# Patient Record
Sex: Male | Born: 1940 | Race: White | Hispanic: No | Marital: Married | Smoking: Former smoker
Health system: Southern US, Community
[De-identification: ages and names within clinical notes are randomized; demographics above are authoritative.]

## PROBLEM LIST (undated history)

## (undated) DIAGNOSIS — G20A1 Parkinson's disease without dyskinesia, without mention of fluctuations: Secondary | ICD-10-CM

## (undated) DIAGNOSIS — M51369 Other intervertebral disc degeneration, lumbar region without mention of lumbar back pain or lower extremity pain: Secondary | ICD-10-CM

## (undated) DIAGNOSIS — M1990 Unspecified osteoarthritis, unspecified site: Secondary | ICD-10-CM

## (undated) DIAGNOSIS — R251 Tremor, unspecified: Secondary | ICD-10-CM

## (undated) DIAGNOSIS — K635 Polyp of colon: Secondary | ICD-10-CM

## (undated) DIAGNOSIS — G20 Parkinson's disease: Secondary | ICD-10-CM

## (undated) DIAGNOSIS — M5430 Sciatica, unspecified side: Secondary | ICD-10-CM

## (undated) DIAGNOSIS — K219 Gastro-esophageal reflux disease without esophagitis: Secondary | ICD-10-CM

## (undated) DIAGNOSIS — D0470 Carcinoma in situ of skin of unspecified lower limb, including hip: Secondary | ICD-10-CM

## (undated) DIAGNOSIS — K5790 Diverticulosis of intestine, part unspecified, without perforation or abscess without bleeding: Secondary | ICD-10-CM

## (undated) DIAGNOSIS — I10 Essential (primary) hypertension: Secondary | ICD-10-CM

## (undated) DIAGNOSIS — M5136 Other intervertebral disc degeneration, lumbar region: Secondary | ICD-10-CM

## (undated) HISTORY — DX: Gastro-esophageal reflux disease without esophagitis: K21.9

## (undated) HISTORY — DX: Other intervertebral disc degeneration, lumbar region without mention of lumbar back pain or lower extremity pain: M51.369

## (undated) HISTORY — DX: Carcinoma in situ of skin of unspecified lower limb, including hip: D04.70

## (undated) HISTORY — DX: Unspecified osteoarthritis, unspecified site: M19.90

## (undated) HISTORY — PX: CLEFT PALATE REPAIR: SUR1165

## (undated) HISTORY — DX: Other intervertebral disc degeneration, lumbar region: M51.36

## (undated) HISTORY — DX: Sciatica, unspecified side: M54.30

## (undated) HISTORY — DX: Polyp of colon: K63.5

## (undated) HISTORY — DX: Diverticulosis of intestine, part unspecified, without perforation or abscess without bleeding: K57.90

## (undated) HISTORY — DX: Tremor, unspecified: R25.1

## (undated) HISTORY — DX: Essential (primary) hypertension: I10

## (undated) HISTORY — PX: CLEFT LIP REPAIR: SUR1164

---

## 2016-07-05 DIAGNOSIS — E784 Other hyperlipidemia: Secondary | ICD-10-CM | POA: Diagnosis not present

## 2016-07-05 DIAGNOSIS — E7800 Pure hypercholesterolemia, unspecified: Secondary | ICD-10-CM | POA: Diagnosis not present

## 2016-07-05 DIAGNOSIS — E559 Vitamin D deficiency, unspecified: Secondary | ICD-10-CM | POA: Diagnosis not present

## 2016-07-05 DIAGNOSIS — I10 Essential (primary) hypertension: Secondary | ICD-10-CM | POA: Diagnosis not present

## 2016-07-05 DIAGNOSIS — E119 Type 2 diabetes mellitus without complications: Secondary | ICD-10-CM | POA: Diagnosis not present

## 2016-07-16 DIAGNOSIS — J329 Chronic sinusitis, unspecified: Secondary | ICD-10-CM | POA: Diagnosis not present

## 2016-07-16 DIAGNOSIS — E782 Mixed hyperlipidemia: Secondary | ICD-10-CM | POA: Diagnosis not present

## 2016-07-16 DIAGNOSIS — G250 Essential tremor: Secondary | ICD-10-CM | POA: Diagnosis not present

## 2016-07-16 DIAGNOSIS — R42 Dizziness and giddiness: Secondary | ICD-10-CM | POA: Diagnosis not present

## 2016-07-31 DIAGNOSIS — L570 Actinic keratosis: Secondary | ICD-10-CM | POA: Diagnosis not present

## 2016-07-31 DIAGNOSIS — D1801 Hemangioma of skin and subcutaneous tissue: Secondary | ICD-10-CM | POA: Diagnosis not present

## 2016-07-31 DIAGNOSIS — D692 Other nonthrombocytopenic purpura: Secondary | ICD-10-CM | POA: Diagnosis not present

## 2016-07-31 DIAGNOSIS — Z7189 Other specified counseling: Secondary | ICD-10-CM | POA: Diagnosis not present

## 2016-07-31 DIAGNOSIS — L814 Other melanin hyperpigmentation: Secondary | ICD-10-CM | POA: Diagnosis not present

## 2016-07-31 DIAGNOSIS — L821 Other seborrheic keratosis: Secondary | ICD-10-CM | POA: Diagnosis not present

## 2016-09-18 DIAGNOSIS — H01001 Unspecified blepharitis right upper eyelid: Secondary | ICD-10-CM | POA: Diagnosis not present

## 2016-09-18 DIAGNOSIS — H16223 Keratoconjunctivitis sicca, not specified as Sjogren's, bilateral: Secondary | ICD-10-CM | POA: Diagnosis not present

## 2016-09-18 DIAGNOSIS — H01004 Unspecified blepharitis left upper eyelid: Secondary | ICD-10-CM | POA: Diagnosis not present

## 2016-09-18 DIAGNOSIS — H25813 Combined forms of age-related cataract, bilateral: Secondary | ICD-10-CM | POA: Diagnosis not present

## 2016-10-16 DIAGNOSIS — E119 Type 2 diabetes mellitus without complications: Secondary | ICD-10-CM | POA: Diagnosis not present

## 2016-10-16 DIAGNOSIS — J329 Chronic sinusitis, unspecified: Secondary | ICD-10-CM | POA: Diagnosis not present

## 2016-10-16 DIAGNOSIS — G250 Essential tremor: Secondary | ICD-10-CM | POA: Diagnosis not present

## 2016-10-16 DIAGNOSIS — J449 Chronic obstructive pulmonary disease, unspecified: Secondary | ICD-10-CM | POA: Diagnosis not present

## 2016-11-10 DIAGNOSIS — R110 Nausea: Secondary | ICD-10-CM | POA: Diagnosis not present

## 2016-11-16 ENCOUNTER — Telehealth: Payer: Self-pay | Admitting: Hematology

## 2016-11-16 NOTE — Telephone Encounter (Signed)
Pt has been scheduled to see Dr. Irene Limbo on 6/18 at 11:10 am. Aware to arrive at 1045am to be checked in. Pt's wife voiced understanding. They are moving to [place] from [place] and wanted an appt the week of 6/18

## 2016-11-26 ENCOUNTER — Encounter: Payer: Medicare Other | Admitting: Hematology

## 2016-11-29 ENCOUNTER — Ambulatory Visit: Payer: Self-pay | Admitting: Family Medicine

## 2016-12-10 DIAGNOSIS — Z6824 Body mass index (BMI) 24.0-24.9, adult: Secondary | ICD-10-CM | POA: Diagnosis not present

## 2016-12-10 DIAGNOSIS — R251 Tremor, unspecified: Secondary | ICD-10-CM | POA: Diagnosis not present

## 2016-12-10 DIAGNOSIS — I10 Essential (primary) hypertension: Secondary | ICD-10-CM | POA: Diagnosis not present

## 2016-12-10 DIAGNOSIS — D696 Thrombocytopenia, unspecified: Secondary | ICD-10-CM | POA: Diagnosis not present

## 2016-12-10 DIAGNOSIS — J449 Chronic obstructive pulmonary disease, unspecified: Secondary | ICD-10-CM | POA: Diagnosis present

## 2016-12-10 DIAGNOSIS — K635 Polyp of colon: Secondary | ICD-10-CM | POA: Diagnosis not present

## 2016-12-10 DIAGNOSIS — E785 Hyperlipidemia, unspecified: Secondary | ICD-10-CM | POA: Diagnosis present

## 2016-12-10 DIAGNOSIS — Z1389 Encounter for screening for other disorder: Secondary | ICD-10-CM | POA: Diagnosis not present

## 2016-12-10 DIAGNOSIS — E784 Other hyperlipidemia: Secondary | ICD-10-CM | POA: Diagnosis not present

## 2016-12-10 DIAGNOSIS — R7309 Other abnormal glucose: Secondary | ICD-10-CM | POA: Diagnosis not present

## 2016-12-17 ENCOUNTER — Telehealth: Payer: Self-pay | Admitting: Hematology

## 2016-12-17 DIAGNOSIS — R350 Frequency of micturition: Secondary | ICD-10-CM | POA: Diagnosis not present

## 2016-12-17 NOTE — Telephone Encounter (Signed)
Pt's hem appt has been rescheduled to 7/30 at 11am. Appt given to the pt's daughter. Aware to arrive 30 minutes early. Voiced understanding.

## 2016-12-19 NOTE — Progress Notes (Signed)
Michael Odonnell was seen today in the movement disorders clinic for neurologic consultation at the request of Reynold Bowen, MD.  This patient is accompanied in the office by his spouse who supplements the history.  Pt just moved from [facility].   The consultation is for the evaluation of rest tremor x 1 year.  He was seen by his primary care physician in [place] who started him on primidone for a diagnosis of "essential tremor."  This caused extreme loss of balance and the medication was discontinued.  Specific Symptoms:  Tremor: Yes.  , both hands and both feet (believes that it started this way as well); most at rest Family hx of similar:  No. Voice: weaker, more difficult to understand, more SOB Sleep: trouble getting to sleep  Vivid Dreams:  No.  Acting out dreams:  Yes.   (rarely - fell out of bed from "fighting in dreams" about 2 years ago) Wet Pillows: Yes.   Postural symptoms:  Yes.    Falls?  No. Bradykinesia symptoms: shuffling gait, slow movements, drooling while awake and difficulty getting out of a chair Loss of smell:  No. Loss of taste:  No. Urinary Incontinence:  No.  But has extreme frequency keeping him up at night Difficulty Swallowing:  No. Handwriting, micrographia: Yes.   (he is L hand dominant) Trouble with ADL's:  No. (wife states he is slower)  Trouble buttoning clothing: No. Depression:  "cannot say  - I have not thought about it." Memory changes:  Yes.  , mild Hallucinations:  No.  visual distortions: No. N/V:  Some nausea with back pain Lightheaded:  No.  Syncope: No. Diplopia:  No. Dyskinesia:  No.  Neuroimaging has not previously been performed.   PREVIOUS MEDICATIONS: Primidone (loss of balance)  ALLERGIES:   Allergies  Allergen Reactions  . Codeine Nausea And Vomiting    CURRENT MEDICATIONS:  Outpatient Encounter Prescriptions as of 12/20/2016  Medication Sig  . amLODipine (NORVASC) 5 MG tablet Take 5 mg by mouth daily.  Marland Kitchen CRANBERRY PO Take by  mouth 2 (two) times daily.  . metoprolol succinate (TOPROL-XL) 50 MG 24 hr tablet Take 50 mg by mouth daily. Take with or immediately following a meal.  . MULTIPLE VITAMIN PO Take by mouth daily.  . niacin 500 MG tablet Take 500 mg by mouth 2 (two) times daily.  . ondansetron (ZOFRAN) 4 MG tablet Take 4 mg by mouth every 8 (eight) hours as needed for nausea or vomiting.  . perindopril (ACEON) 8 MG tablet Take 8 mg by mouth daily.  . Phenazopyridine HCl (AZO DINE PO) Take by mouth.  . ranitidine (ZANTAC) 150 MG tablet Take 150 mg by mouth 2 (two) times daily.  Marland Kitchen UNABLE TO FIND CBD hemp extract drops 1000 mg BID   No facility-administered encounter medications on file as of 12/20/2016.     PAST MEDICAL HISTORY:   Past Medical History:  Diagnosis Date  . Degenerative lumbar disc   . Hypertension   . Sciatica   . Tremor     PAST SURGICAL HISTORY:   Past Surgical History:  Procedure Laterality Date  . CLEFT LIP REPAIR      SOCIAL HISTORY:   Social History   Social History  . Marital status: Married    Spouse name: N/A  . Number of children: N/A  . Years of education: N/A   Occupational History  . retired     Musician - [employer]  Social History Main Topics  . Smoking status: Former Smoker    Types: Cigarettes    Quit date: 12/20/2012  . Smokeless tobacco: Never Used  . Alcohol use No  . Drug use: No  . Sexual activity: Not on file   Other Topics Concern  . Not on file   Social History Narrative  . No narrative on file    FAMILY HISTORY:   Family Status  Relation Status  . Mother Deceased  . Father Deceased  . Sister Alive  . Brother Deceased  . Daughter Alive    ROS:  Chronic LBP.  A complete 10 system review of systems was obtained and was unremarkable apart from what is mentioned above.  PHYSICAL EXAMINATION:    VITALS:   Vitals:   12/20/16 0934  BP: (!) 146/60  Pulse: 60  SpO2: 94%  Weight: 153 lb (69.4 kg)    Height: 5' 9.5" (1.765 m)    GEN:  The patient appears stated age and is in NAD. HEENT:  Normocephalic, atraumatic.  The mucous membranes are moist. The superficial temporal arteries are without ropiness or tenderness. CV:  RRR Lungs:  CTAB Neck/HEME:  There are no carotid bruits bilaterally.  Neurological examination:  Orientation: The patient is alert and oriented x3. Fund of knowledge is appropriate.  Recent and remote memory are intact.  Attention and concentration are normal.    Able to name objects and repeat phrases. Cranial nerves: There is good facial symmetry.  There is mild facial hypomimia.  Pupils are equal round and reactive to light bilaterally. Fundoscopic exam reveals clear margins bilaterally. Extraocular muscles are intact. There are square wave jerks.  The visual fields are full to confrontational testing. The speech is fluent and clear.   He is hypophonic and somewhat pseudobulbar.  The patient is able to make the gutteral sounds without difficulty.  Soft palate rises symmetrically and there is no tongue deviation. Hearing is intact to conversational tone. Sensation: Sensation is intact to light and pinprick throughout (facial, trunk, extremities). Vibration is absent at the bilateral big toe but intact and decreased at the bilateral ankle. There is no extinction with double simultaneous stimulation. There is no sensory dermatomal level identified. Motor: Strength is 5/5 in the bilateral upper and lower extremities.   Shoulder shrug is equal and symmetric.  There is no pronator drift. Deep tendon reflexes: Deep tendon reflexes are 2-/4 at the bilateral biceps, triceps, brachioradialis, 2+ at the bilateral patella with cross adductor reflexes and absent at the bilateral achilles. Plantar responses are downgoing bilaterally.  Movement examination: Tone: There is normal tone in the bilateral upper extremities.  The tone in the lower extremities is normal.  Abnormal movements:  There is independent R and LUE resting tremor that increases with distraction.  Leg tremor bilaterally with distraction Coordination:  There is mild decremation with RAM's, seen with finger taps and alternation of supination and pronation of the forearm bilaterally.   Gait and Station: The patient has minimal difficulty arising out of a deep-seated chair without the use of the hands but is able to do it on the first attempt. The patient's stride length is normal with just slight decreased arm swing on the right.  The patient has a negative pull test.   Most recent labs done 11/12/16.     His sodium was 138, potassium 4.3, chloride 102, CO2 28, BUN 20, creatinine 1.13.  His white blood cells were 4.2, hemoglobin 15.0, hematocrit 42.6 and platelets were  low at 78.  TSH last done in 03/2015 and was 2.030  ASSESSMENT/PLAN:  1.  Tremor with parkinsonism  -Patient does not meet formal criteria via [place] [facility] Criteria for idiopathic Parkinson's disease at this point in time.  However, he does look parkinsonian and I am concerned that he will meet criteria in the future, either for idiopathic Parkinson's disease or for an atypical state.  He does have square wave jerks, pseudobulbar speech and having urinary frequency at night, although not necessarily incontinence.  We will really need to watch him closely with the course of time.  This really does not necessarily change treatment, however.  I had a long discussion with the patient and his wife.  We talked about the value of exercise and the importance of exercise, particularly safe, cardiovascular exercise.  He met with our social worker to discuss community programs in this regard.  I would not recommend starting medications.    -we will check his TSH and B12.  -will do MRI brain  2.  Thrombocytopenia  -Being monitored by primary care.  3.  Urinary frequency.  -His wife asked me about this.  We talked about the value of medication such as Myrbetriq.   Other medicines may be of value, but often times will cause loss of balance.  They are going to talk to their primary care physician about a potential referral to urology, if he feels necessary.  They asked me for that referral, but I felt that their primary care physician may be able to help them first.  4.  F/u 6 months, sooner should new issues arise.  Much greater than 50% of this visit was spent in counseling and coordinating care.  Total face to face time:  60 min   Cc:  Reynold Bowen, MD

## 2016-12-20 ENCOUNTER — Ambulatory Visit (INDEPENDENT_AMBULATORY_CARE_PROVIDER_SITE_OTHER): Payer: Medicare Other | Admitting: Neurology

## 2016-12-20 ENCOUNTER — Other Ambulatory Visit: Payer: Medicare Other

## 2016-12-20 ENCOUNTER — Encounter: Payer: Self-pay | Admitting: Neurology

## 2016-12-20 VITALS — BP 146/60 | HR 60 | Ht 69.5 in | Wt 153.0 lb

## 2016-12-20 DIAGNOSIS — R471 Dysarthria and anarthria: Secondary | ICD-10-CM | POA: Diagnosis not present

## 2016-12-20 DIAGNOSIS — D696 Thrombocytopenia, unspecified: Secondary | ICD-10-CM | POA: Diagnosis not present

## 2016-12-20 DIAGNOSIS — G20 Parkinson's disease: Secondary | ICD-10-CM

## 2016-12-20 DIAGNOSIS — R251 Tremor, unspecified: Secondary | ICD-10-CM

## 2016-12-20 DIAGNOSIS — E538 Deficiency of other specified B group vitamins: Secondary | ICD-10-CM | POA: Diagnosis not present

## 2016-12-20 LAB — TSH: TSH: 0.8 mIU/L (ref 0.40–4.50)

## 2016-12-20 NOTE — Patient Instructions (Signed)
1. We have sent a referral to [facility] for your MRI and they will call you directly to schedule your appt. They are located at [address] If you need to contact them directly please call [phone].  2. Your provider has requested that you have labwork completed today. Please go to [facility] ([id number]) on the second floor of this building before leaving the office today. You do not need to check in. If you are not called within 15 minutes please check with the front desk.   

## 2016-12-21 LAB — VITAMIN B12: Vitamin B-12: 685 pg/mL (ref 200–1100)

## 2016-12-22 ENCOUNTER — Encounter (HOSPITAL_COMMUNITY): Payer: Self-pay | Admitting: Emergency Medicine

## 2016-12-22 ENCOUNTER — Emergency Department (HOSPITAL_COMMUNITY)
Admission: EM | Admit: 2016-12-22 | Discharge: 2016-12-22 | Disposition: A | Discharge status: Home / Self Care | Payer: Medicare Other | Attending: Physician Assistant | Admitting: Physician Assistant

## 2016-12-22 ENCOUNTER — Encounter: Payer: Self-pay | Admitting: Internal Medicine

## 2016-12-22 DIAGNOSIS — Z79899 Other long term (current) drug therapy: Secondary | ICD-10-CM | POA: Diagnosis not present

## 2016-12-22 DIAGNOSIS — I10 Essential (primary) hypertension: Secondary | ICD-10-CM | POA: Diagnosis not present

## 2016-12-22 DIAGNOSIS — R350 Frequency of micturition: Secondary | ICD-10-CM | POA: Insufficient documentation

## 2016-12-22 DIAGNOSIS — Z87891 Personal history of nicotine dependence: Secondary | ICD-10-CM | POA: Insufficient documentation

## 2016-12-22 DIAGNOSIS — M545 Low back pain: Secondary | ICD-10-CM | POA: Insufficient documentation

## 2016-12-22 DIAGNOSIS — R110 Nausea: Secondary | ICD-10-CM | POA: Diagnosis not present

## 2016-12-22 LAB — URINALYSIS, ROUTINE W REFLEX MICROSCOPIC
BACTERIA UA: NONE SEEN
BILIRUBIN URINE: NEGATIVE
GLUCOSE, UA: NEGATIVE mg/dL
KETONES UR: NEGATIVE mg/dL
LEUKOCYTES UA: NEGATIVE
NITRITE: NEGATIVE
Protein, ur: NEGATIVE mg/dL
Specific Gravity, Urine: 1.005 (ref 1.005–1.030)
Squamous Epithelial / LPF: NONE SEEN
pH: 6 (ref 5.0–8.0)

## 2016-12-22 MED ORDER — TAMSULOSIN HCL 0.4 MG PO CAPS: 0.4000 mg | ORAL_CAPSULE | Freq: Every day | ORAL | 0 refills | Status: AC

## 2016-12-22 NOTE — Progress Notes (Signed)
I was called per spouse @ 2:49 pm regarding Mr Michael Odonnell Urgency and frequency which is getting worse > 1 week. Recent UA - No UTI on 12/17/16 Tests: (1) URINALYSIS (4000)   PH                        7.0                         5.0-9.0   SPECIFIC GRAVITY          1.010                       1.000 - 1.030 ! GLUCOSE                   norm                        Normal   UROBILINOGEN              norm mg/dL                  Normal   KETONES                   neg mg/dl                   NEGATIVE   BLOOD                     neg                         NEGATIVE   NITRATE                   neg                         NEGATIVE   LEUKOCYTES                neg                         NEGATIVE ! PROTEIN                   neg mg/dl                   NEGATIVE   BILIRUBIN                 neg                         NEGATIVE Currently just dribbling small amounts. Now weak and miserable. ? Obstructed. ? Prostatitis. May need catheter, Abx?  May need imaging for stone? Sent to ED for BMET, UA, Bladder scan and eval and Treat They called in 12/20/16 and consult to [facility] placed and currently being worked on but nothing set up as of yet.

## 2016-12-22 NOTE — ED Provider Notes (Signed)
[facility] DEPT Provider Note   CSN: [id number] Arrival date & time: 12/22/16  1537     History   Chief Complaint Chief Complaint  Patient presents with  . Urinary Frequency    HPI Michael Odonnell is a 76 y.o. male.  HPI   Patient is a 76 year old male presenting with urinary symptoms. Patient has increased urge. He feels he does not completely empty. He feels he wants to urinate within an hour. Patient having symptoms for the last several weeks. This got worse last week. Had a negative UA at his primary care physician's office.  Patient had no numbness or tingling no weakness. No back pain different than his ordinary lumbar sacral pain.  Past Medical History:  Diagnosis Date  . Degenerative lumbar disc   . Hypertension   . Sciatica   . Tremor     There are no active problems to display for this patient.   Past Surgical History:  Procedure Laterality Date  . CLEFT LIP REPAIR         Home Medications    Prior to Admission medications   Medication Sig Start Date End Date Taking? Authorizing Provider  amLODipine (NORVASC) 5 MG tablet Take 5 mg by mouth daily.    [provider]  CRANBERRY PO Take by mouth 2 (two) times daily.    [provider]  metoprolol succinate (TOPROL-XL) 50 MG 24 hr tablet Take 50 mg by mouth daily. Take with or immediately following a meal.    [provider]  MULTIPLE VITAMIN PO Take by mouth daily.    [provider]  niacin 500 MG tablet Take 500 mg by mouth 2 (two) times daily.    [provider]  ondansetron (ZOFRAN) 4 MG tablet Take 4 mg by mouth every 8 (eight) hours as needed for nausea or vomiting.    [provider]  perindopril (ACEON) 8 MG tablet Take 8 mg by mouth daily.    [provider]  Phenazopyridine HCl (AZO DINE PO) Take by mouth.    [provider]  ranitidine (ZANTAC) 150 MG tablet Take 150 mg by mouth 2 (two) times daily.    [provider]  tamsulosin (FLOMAX) 0.4 MG CAPS capsule Take 1 capsule (0.4 mg total) by mouth daily. 12/22/16   Asiah Befort Lyn, MD  UNABLE TO FIND CBD hemp extract drops 1000 mg BID    [provider]    Family History Family History  Problem Relation Age of Onset  . Heart attack Mother 65  . Heart disease Sister   . Melanoma Sister   . Heart attack Brother     Social History Social History  Substance Use Topics  . Smoking status: Former Smoker    Types: Cigarettes    Quit date: 12/20/2012  . Smokeless tobacco: Never Used  . Alcohol use No     Allergies   Codeine   Review of Systems Review of Systems  Constitutional: Negative for activity change.  Respiratory: Negative for shortness of breath.   Cardiovascular: Negative for chest pain.  Gastrointestinal: Negative for abdominal pain.  Genitourinary: Positive for difficulty urinating and frequency. Negative for dysuria.     Physical Exam Updated Vital Signs BP (!) 153/70 (BP Location: Left Arm)   Pulse 71   Temp 97.8 F (36.6 C) (Oral)   Resp 18   SpO2 99%   Physical Exam  Constitutional: He is oriented to person, place, and time. He appears well-nourished.  HENT:  Head: Normocephalic.  Eyes: Conjunctivae are normal.  Cardiovascular: Normal rate.   Pulmonary/Chest: Effort normal.  Abdominal: Soft. He exhibits no distension. There is no tenderness.  Neurological: He is oriented to person, place, and time.  Skin: Skin is warm and dry. He is not diaphoretic.  Psychiatric: He has a normal mood and affect. His behavior is normal.     ED Treatments / Results  Labs (all labs ordered are listed, but only abnormal results are displayed) Labs Reviewed  URINALYSIS, ROUTINE W REFLEX MICROSCOPIC - Abnormal; Notable for the following:       Result Value   Color, Urine STRAW (*)    Hgb urine dipstick SMALL (*)    All other components within normal limits    EKG  EKG Interpretation None        Radiology No results found.  Procedures Procedures (including critical care time)  Medications Ordered in ED Medications - No data to display   Initial Impression / Assessment and Plan / ED Course  I have reviewed the triage vital signs and the nursing notes.  Pertinent labs & imaging results that were available during my care of the patient were reviewed by me and considered in my medical decision making (see chart for details).     Patient with symptoms of urinary frequency urge and incomplete void. S several weeks. I'm concerned about BPH. Patient's urine is normal. He's got no red flags to consider cauda equina or other spinal etiologies. Patient's bladder scan is less than 70. We'll have him follow-up with urologist, started on Flomax.   Final Clinical Impressions(s) / ED Diagnoses   Final diagnoses:  Urinary frequency    New Prescriptions New Prescriptions   TAMSULOSIN (FLOMAX) 0.4 MG CAPS CAPSULE    Take 1 capsule (0.4 mg total) by mouth daily.     Macarthur Critchley, MD 12/22/16 2053

## 2016-12-22 NOTE — ED Triage Notes (Signed)
Pt reports he has had urinary frequency for the past week. Went to PCP for this on Monday, but urine sample came back clear. Also reports midline lower back pain and nausea.

## 2016-12-22 NOTE — Discharge Instructions (Signed)
We want you to follow-up with your primary care physician and with the urologist as soon as he can. We've given U medication that will hopefully help her symptoms. But we will need to follow up as an outpatient.

## 2016-12-24 ENCOUNTER — Telehealth: Payer: Self-pay | Admitting: Neurology

## 2016-12-24 NOTE — Telephone Encounter (Signed)
LMOM making patient aware labs okay. Ok to leave detailed vm per DPR.

## 2016-12-24 NOTE — Telephone Encounter (Signed)
-----   Message from Pieter Partridge, DO sent at 12/21/2016  7:25 AM EDT ----- Labs look okay

## 2016-12-26 ENCOUNTER — Telehealth: Payer: Self-pay | Admitting: Neurology

## 2016-12-26 NOTE — Telephone Encounter (Signed)
Patient made aware.

## 2016-12-26 NOTE — Telephone Encounter (Signed)
Caller: Wife  Urgent? No  Reason for the call: She is calling regarding his MRI scheduled for 01/03/17 at [facility]. She wanted to know could he have the MRI of his back as well as the brain? She said he is so weak almost like Flu like symptoms. Please Call. Thanks

## 2016-12-26 NOTE — Telephone Encounter (Signed)
Spoke with patient's wife.  She states that patient has had increased weakness since he was here. He was seen in the ER for urinary symptoms Saturday and has been worse since that time. He has also had low back pain for a long time. He had previous xray which just showed arthritic changes.  She wants to know if we can add an MR Lumbar on to the order for MR Brain.  I made wife aware that based on Dr. Doristine Devoid last note - it states strength was okay. Usually just based on back pain the insurance company would have him complete physical therapy before they cover this scan. He has not had PT.  Dr. Carles Collet please advise on ordering this.

## 2016-12-26 NOTE — Telephone Encounter (Signed)
I can't just add that without appropriate documentation for that.  Probably needs f/u with PCP if already been to the ED to see if something else going on

## 2016-12-31 ENCOUNTER — Other Ambulatory Visit: Payer: Self-pay | Admitting: Neurology

## 2017-01-02 ENCOUNTER — Telehealth: Payer: Self-pay | Admitting: Neurology

## 2017-01-02 DIAGNOSIS — R350 Frequency of micturition: Secondary | ICD-10-CM | POA: Diagnosis not present

## 2017-01-02 DIAGNOSIS — N401 Enlarged prostate with lower urinary tract symptoms: Secondary | ICD-10-CM | POA: Diagnosis not present

## 2017-01-02 DIAGNOSIS — R3915 Urgency of urination: Secondary | ICD-10-CM | POA: Diagnosis not present

## 2017-01-02 NOTE — Telephone Encounter (Signed)
Good afternoon Michael Odonnell this patient is scheduled for Michael. Odonnell @ GI needed me to do auth for his [facility] but according to Nae @ [facility] [place] this patient does not show that he has insurance with them. Advised for the office to call patient and verify  Received above message from [place]. Called patient to check on this. LMOM for him to call back.

## 2017-01-03 ENCOUNTER — Telehealth: Payer: Self-pay | Admitting: Hematology

## 2017-01-03 ENCOUNTER — Other Ambulatory Visit: Payer: Medicare Other

## 2017-01-03 NOTE — Telephone Encounter (Signed)
Patient's wife lft a vm to cancel appt. She stated her husband didn't want to reschedule.

## 2017-01-03 NOTE — Telephone Encounter (Signed)
Called again and spoke with patient's wife.   She states Michael Odonnell should be active til January 09, 2017.   She said either way, they cancelled his MR appt because patient states he does not want to have this done.

## 2017-01-07 ENCOUNTER — Encounter: Payer: Medicare Other | Admitting: Hematology

## 2017-02-14 DIAGNOSIS — M545 Low back pain: Secondary | ICD-10-CM | POA: Diagnosis not present

## 2017-02-20 DIAGNOSIS — H8113 Benign paroxysmal vertigo, bilateral: Secondary | ICD-10-CM | POA: Diagnosis not present

## 2017-02-20 DIAGNOSIS — M545 Low back pain: Secondary | ICD-10-CM | POA: Diagnosis not present

## 2017-02-26 DIAGNOSIS — H8113 Benign paroxysmal vertigo, bilateral: Secondary | ICD-10-CM | POA: Diagnosis not present

## 2017-02-26 DIAGNOSIS — M545 Low back pain: Secondary | ICD-10-CM | POA: Diagnosis not present

## 2017-02-28 DIAGNOSIS — H8113 Benign paroxysmal vertigo, bilateral: Secondary | ICD-10-CM | POA: Diagnosis not present

## 2017-02-28 DIAGNOSIS — M545 Low back pain: Secondary | ICD-10-CM | POA: Diagnosis not present

## 2017-03-04 ENCOUNTER — Encounter: Payer: Self-pay | Admitting: Internal Medicine

## 2017-03-14 DIAGNOSIS — H8113 Benign paroxysmal vertigo, bilateral: Secondary | ICD-10-CM | POA: Diagnosis not present

## 2017-03-14 DIAGNOSIS — M545 Low back pain: Secondary | ICD-10-CM | POA: Diagnosis not present

## 2017-04-08 ENCOUNTER — Telehealth: Payer: Self-pay | Admitting: Neurology

## 2017-04-08 NOTE — Telephone Encounter (Signed)
Rec'd records from [facility]Flow forward 8 pages to Dr. Alonza Bogus

## 2017-05-07 ENCOUNTER — Ambulatory Visit (INDEPENDENT_AMBULATORY_CARE_PROVIDER_SITE_OTHER): Payer: Medicare Other | Admitting: Internal Medicine

## 2017-05-07 ENCOUNTER — Encounter: Payer: Self-pay | Admitting: Internal Medicine

## 2017-05-07 ENCOUNTER — Encounter (INDEPENDENT_AMBULATORY_CARE_PROVIDER_SITE_OTHER): Payer: Self-pay

## 2017-05-07 VITALS — BP 116/60 | HR 68 | Ht 66.75 in | Wt 158.4 lb

## 2017-05-07 DIAGNOSIS — K219 Gastro-esophageal reflux disease without esophagitis: Secondary | ICD-10-CM | POA: Diagnosis not present

## 2017-05-07 DIAGNOSIS — Z86010 Personal history of colonic polyps: Secondary | ICD-10-CM | POA: Diagnosis not present

## 2017-05-07 DIAGNOSIS — R110 Nausea: Secondary | ICD-10-CM | POA: Diagnosis not present

## 2017-05-07 NOTE — Progress Notes (Signed)
HISTORY OF PRESENT ILLNESS:  Michael Odonnell is a 76 y.o. male with past medical history as listed below who is sent today by his primary care provider Dr. Forde Odonnell regarding problems with frequent nausea and the need for surveillance colonoscopy. Patient is accompanied today by his wife. They have relocated to [place] from [place] within the past year. Patient tells me that he was having problems with nausea in December 2017. He may have had his medications adjusted and was prescribed Zofran. At that time he had loss of appetite and loss of weight. However, his problems have subsequently resolved without recurrence area. He does have occasional acid reflux for which she takes exam tach. No dysphagia. He apparently has had multiple colonoscopies over the years. He does bring with him a copy of his last such exam from [place] performed by Michael Odonnell. This report has been personally reviewed. The indication was "a history of colon polyps" the exam was said to be complete with appropriate landmarks identified and said to be normal. No comment on quality of prep. Findings included for diminutive colon polyps which were removed with cold biopsy forceps, diverticulosis, and hemorrhoids. There are several photographs including the appendiceal orifice and retroflex view of the rectum. The pathology is available. Specimens were 5 mm or less. 2 polyps were tubular adenomas. 2 polyps were hyperplastic. The patient and his wife tell me that the doctors in [place] recommended follow-up colonoscopies every 3 years. The patient's GI review of systems is currently negative. No family history of colon cancer in first-degree relative. He has been evaluated by neurology for possible early Parkinson's. Review of outside blood work from dated June 2018 finds a normal comprehensive metabolic panel. CBC is normal except for platelets of 78,000. No relevant radiology available for review  REVIEW OF SYSTEMS:  All non-GI ROS negative  unless otherwise stated in the history of present illness except for back pain, urinary frequency, voice change, tremor  Past Medical History:  Diagnosis Date  . Arthritis   . Colon polyps   . Degenerative lumbar disc   . Diverticulosis   . GERD (gastroesophageal reflux disease)   . Hypertension   . Sciatica   . Tremor     Past Surgical History:  Procedure Laterality Date  . CLEFT LIP REPAIR    . CLEFT PALATE REPAIR      Social History Michael Odonnell  reports that he quit smoking about 4 years ago. His smoking use included cigarettes. he has never used smokeless tobacco. He reports that he does not drink alcohol or use drugs.  family history includes Heart attack in his brother; Heart attack (age of onset: 82) in his mother; Heart disease in his sister; Melanoma in his sister.  Allergies  Allergen Reactions  . Codeine Nausea And Vomiting       PHYSICAL EXAMINATION: Vital signs: BP 116/60 (BP Location: Left Arm, Patient Position: Sitting, Cuff Size: Normal)   Pulse 68   Ht 5' 6.75" (1.695 m) Comment: height measured without shoes  Wt 158 lb 6 oz (71.8 kg)   BMI 24.99 kg/m   Constitutional: generally well-appearing, no acute distress Psychiatric: alert and oriented x3, cooperative Eyes: extraocular movements intact, anicteric, conjunctiva pink Mouth: oral pharynx moist, no lesions Neck: supple no lymphadenopathy Cardiovascular: heart regular rate and rhythm, no murmur Lungs: clear to auscultation bilaterally Abdomen: soft, nontender, nondistended, no obvious ascites, no peritoneal signs, normal bowel sounds, no organomegaly Rectal: Omitted Extremities: no having, cyanosis, or lower extremity edema  bilaterally Skin: no lesions on visible extremities Neuro: No focal deficits. Tremor with pill-rolling behavior in the right hand  ASSESSMENT:  #1. Previous problems with nausea. Resolved. Based on history, possible medication reaction #2. GERD without alarm features.  Managed with Zantac #3. History of diminutive colonic adenomas on colonoscopy elsewhere 01/19/2014.   PLAN:  #1. Reflux precautions #2. Continue Zantac #3. I reviewed with the patient the current guidelines for colon polyp surveillance. Based on the findings from his last examination a routine follow-up exam would be recommended 5 years later. This would be 2020 when the patient is a 75. As such, no routine surveillance recommended. He could be reevaluated if surgery for signs or symptoms as needed.  30 minutes spent face-to-face with the patient. Greater than 50% a time use for counseling regarding his intermittent issues with nausea, GERD, and careful review of the topic of colon polyp surveillance  A copy of this consultation note has been sent to Dr. Forde Odonnell

## 2017-05-07 NOTE — Patient Instructions (Signed)
Please follow up as needed 

## 2017-05-27 DIAGNOSIS — I10 Essential (primary) hypertension: Secondary | ICD-10-CM | POA: Diagnosis not present

## 2017-05-27 DIAGNOSIS — E7849 Other hyperlipidemia: Secondary | ICD-10-CM | POA: Diagnosis not present

## 2017-06-12 DIAGNOSIS — D696 Thrombocytopenia, unspecified: Secondary | ICD-10-CM | POA: Diagnosis not present

## 2017-06-12 DIAGNOSIS — N401 Enlarged prostate with lower urinary tract symptoms: Secondary | ICD-10-CM | POA: Insufficient documentation

## 2017-06-12 DIAGNOSIS — Z6825 Body mass index (BMI) 25.0-25.9, adult: Secondary | ICD-10-CM | POA: Diagnosis not present

## 2017-06-12 DIAGNOSIS — J449 Chronic obstructive pulmonary disease, unspecified: Secondary | ICD-10-CM | POA: Diagnosis not present

## 2017-06-12 DIAGNOSIS — K635 Polyp of colon: Secondary | ICD-10-CM | POA: Diagnosis not present

## 2017-06-12 DIAGNOSIS — Z1389 Encounter for screening for other disorder: Secondary | ICD-10-CM | POA: Diagnosis not present

## 2017-06-12 DIAGNOSIS — M545 Low back pain: Secondary | ICD-10-CM | POA: Diagnosis not present

## 2017-06-12 DIAGNOSIS — R7309 Other abnormal glucose: Secondary | ICD-10-CM | POA: Diagnosis not present

## 2017-06-12 DIAGNOSIS — E7849 Other hyperlipidemia: Secondary | ICD-10-CM | POA: Diagnosis not present

## 2017-06-12 DIAGNOSIS — I10 Essential (primary) hypertension: Secondary | ICD-10-CM | POA: Diagnosis not present

## 2017-06-21 NOTE — Progress Notes (Deleted)
Michael Odonnell was seen today in the movement disorders clinic for neurologic consultation at the request of Reynold Bowen, MD.  This patient is accompanied in the office by his spouse who supplements the history.  Pt just moved from [facility].   The consultation is for the evaluation of rest tremor x 1 year.  He was seen by his primary care physician in [place] who started him on primidone for a diagnosis of "essential tremor."  This caused extreme loss of balance and the medication was discontinued.  Specific Symptoms:  Tremor: Yes.  , both hands and both feet (believes that it started this way as well); most at rest Family hx of similar:  No. Voice: weaker, more difficult to understand, more SOB Sleep: trouble getting to sleep  Vivid Dreams:  No.  Acting out dreams:  Yes.   (rarely - fell out of bed from "fighting in dreams" about 2 years ago) Wet Pillows: Yes.   Postural symptoms:  Yes.    Falls?  No. Bradykinesia symptoms: shuffling gait, slow movements, drooling while awake and difficulty getting out of a chair Loss of smell:  No. Loss of taste:  No. Urinary Incontinence:  No.  But has extreme frequency keeping him up at night Difficulty Swallowing:  No. Handwriting, micrographia: Yes.   (he is L hand dominant) Trouble with ADL's:  No. (wife states he is slower)  Trouble buttoning clothing: No. Depression:  "cannot say  - I have not thought about it." Memory changes:  Yes.  , mild Hallucinations:  No.  visual distortions: No. N/V:  Some nausea with back pain Lightheaded:  No.  Syncope: No. Diplopia:  No. Dyskinesia:  No.  Neuroimaging has not previously been performed.   06/24/17 update: Patient is seen today in follow-up.  He is accompanied by his wife who supplements the history.  I have reviewed records available to me since last visit.  He saw gastroenterology at the end of November.  He reports that tremor has been about the same since last visit.  Pt denies falls.  Pt denies  lightheadedness, near syncope.  No hallucinations.  Mood has been good.  I ordered an MRI of the brain at our last visit.  Patient canceled this and decided that he did not want to complete it.  PREVIOUS MEDICATIONS: Primidone (loss of balance)  ALLERGIES:   Allergies  Allergen Reactions  . Codeine Nausea And Vomiting    CURRENT MEDICATIONS:  Outpatient Encounter Medications as of 06/24/2017  Medication Sig  . amLODipine (NORVASC) 5 MG tablet Take 5 mg by mouth daily.  . magnesium gluconate (MAGONATE) 500 MG tablet Take 500 mg by mouth daily.  . metoprolol succinate (TOPROL-XL) 50 MG 24 hr tablet Take 50 mg by mouth daily. Take with or immediately following a meal.  . Multiple Vitamins-Minerals (CENTRUM SILVER PO) Take 1 tablet by mouth daily.  . niacin 500 MG tablet Take 500 mg by mouth 2 (two) times daily.  . ondansetron (ZOFRAN) 4 MG tablet Take 4 mg by mouth every 8 (eight) hours as needed for nausea or vomiting.  . perindopril (ACEON) 8 MG tablet Take 8 mg by mouth daily.  . ranitidine (ZANTAC) 150 MG tablet Take 150 mg by mouth 2 (two) times daily.  . tamsulosin (FLOMAX) 0.4 MG CAPS capsule Take 1 capsule (0.4 mg total) by mouth daily.  Marland Kitchen UNABLE TO FIND CBD hemp extract drops 1000 mg BID   No facility-administered encounter medications on file as of  06/24/2017.     PAST MEDICAL HISTORY:   Past Medical History:  Diagnosis Date  . Arthritis   . Colon polyps   . Degenerative lumbar disc   . Diverticulosis   . GERD (gastroesophageal reflux disease)   . Hypertension   . Sciatica   . Tremor     PAST SURGICAL HISTORY:   Past Surgical History:  Procedure Laterality Date  . CLEFT LIP REPAIR    . CLEFT PALATE REPAIR      SOCIAL HISTORY:   Social History   Socioeconomic History  . Marital status: Married    Spouse name: Not on file  . Number of children: 2  . Years of education: Not on file  . Highest education level: Not on file  Social Needs  . Financial resource  strain: Not on file  . Food insecurity - worry: Not on file  . Food insecurity - inability: Not on file  . Transportation needs - medical: Not on file  . Transportation needs - non-medical: Not on file  Occupational History  . 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 Smoking status: Former Smoker    Types: Cigarettes    Last attempt to quit: 12/20/2012    Years since quitting: 4.5  . Smokeless tobacco: Never Used  Substance and Sexual Activity  . Alcohol use: No  . Drug use: No  . Sexual activity: Not on file  Other Topics Concern  . Not on file  Social History Narrative  . Not on file    FAMILY HISTORY:   Family Status  Relation Name Status  . Mother  Deceased  . Father  Deceased  . Sister  Alive  . Brother  Deceased  . Daughter x2 Alive    ROS:  Chronic LBP.  A complete 10 system review of systems was obtained and was unremarkable apart from what is mentioned above.  PHYSICAL EXAMINATION:    VITALS:   There were no vitals filed for this visit.  GEN:  The patient appears stated age and is in NAD. HEENT:  Normocephalic, atraumatic.  The mucous membranes are moist. The superficial temporal arteries are without ropiness or tenderness. CV:  RRR Lungs:  CTAB Neck/HEME:  There are no carotid bruits bilaterally.  Neurological examination:  Orientation: The patient is alert and oriented x3. Fund of knowledge is appropriate.  Recent and remote memory are intact.  Attention and concentration are normal.    Able to name objects and repeat phrases. Cranial nerves: There is good facial symmetry.  There is mild facial hypomimia.  Pupils are equal round and reactive to light bilaterally. Fundoscopic exam reveals clear margins bilaterally. Extraocular muscles are intact. There are square wave jerks.  The visual fields are full to confrontational testing. The speech is fluent and clear.   He is hypophonic and somewhat  pseudobulbar.  The patient is able to make the gutteral sounds without difficulty.  Soft palate rises symmetrically and there is no tongue deviation. Hearing is intact to conversational tone. Sensation: Sensation is intact to light and pinprick throughout (facial, trunk, extremities). Vibration is absent at the bilateral big toe but intact and decreased at the bilateral ankle. There is no extinction with double simultaneous stimulation. There is no sensory dermatomal level identified. Motor: Strength is 5/5 in the bilateral upper and lower extremities.   Shoulder shrug is equal and symmetric.  There is no pronator drift. Deep tendon  reflexes: Deep tendon reflexes are 2-/4 at the bilateral biceps, triceps, brachioradialis, 2+ at the bilateral patella with cross adductor reflexes and absent at the bilateral achilles. Plantar responses are downgoing bilaterally.  Movement examination: Tone: There is normal tone in the bilateral upper extremities.  The tone in the lower extremities is normal.  Abnormal movements: There is independent R and LUE resting tremor that increases with distraction.  Leg tremor bilaterally with distraction Coordination:  There is mild decremation with RAM's, seen with finger taps and alternation of supination and pronation of the forearm bilaterally.   Gait and Station: The patient has minimal difficulty arising out of a deep-seated chair without the use of the hands but is able to do it on the first attempt. The patient's stride length is normal with just slight decreased arm swing on the right.  The patient has a negative pull test.   Most recent labs done 11/12/16.     His sodium was 138, potassium 4.3, chloride 102, CO2 28, BUN 20, creatinine 1.13.  His white blood cells were 4.2, hemoglobin 15.0, hematocrit 42.6 and platelets were low at 78.  Lab Results  Component Value Date   TSH 0.80 12/20/2016   Lab Results  Component Value Date   VITAMINB12 685 12/20/2016      ASSESSMENT/PLAN:  1.  Tremor with parkinsonism  -***  2.  Thrombocytopenia  -Being monitored by primary care.  3.  Urinary frequency.  -His wife asked me about this.  We talked about the value of medication such as Myrbetriq.  Other medicines may be of value, but often times will cause loss of balance.  They are going to talk to their primary care physician about a potential referral to urology, if he feels necessary.  They asked me for that referral, but I felt that their primary care physician may be able to help them first.  4.  F/u 6 months, sooner should new issues arise.  Much greater than 50% of this visit was spent in counseling and coordinating care.  Total face to face time:  60 min   Cc:  Reynold Bowen, MD

## 2017-06-24 ENCOUNTER — Ambulatory Visit: Payer: Medicare Other | Admitting: Neurology

## 2017-07-18 DIAGNOSIS — H01022 Squamous blepharitis right lower eyelid: Secondary | ICD-10-CM | POA: Diagnosis not present

## 2017-07-18 DIAGNOSIS — H01024 Squamous blepharitis left upper eyelid: Secondary | ICD-10-CM | POA: Diagnosis not present

## 2017-07-18 DIAGNOSIS — H01021 Squamous blepharitis right upper eyelid: Secondary | ICD-10-CM | POA: Diagnosis not present

## 2017-07-18 DIAGNOSIS — H00025 Hordeolum internum left lower eyelid: Secondary | ICD-10-CM | POA: Diagnosis not present

## 2017-07-18 DIAGNOSIS — H01025 Squamous blepharitis left lower eyelid: Secondary | ICD-10-CM | POA: Diagnosis not present

## 2017-08-23 DIAGNOSIS — H04123 Dry eye syndrome of bilateral lacrimal glands: Secondary | ICD-10-CM | POA: Diagnosis not present

## 2017-08-23 DIAGNOSIS — H00025 Hordeolum internum left lower eyelid: Secondary | ICD-10-CM | POA: Diagnosis not present

## 2017-08-23 DIAGNOSIS — H01025 Squamous blepharitis left lower eyelid: Secondary | ICD-10-CM | POA: Diagnosis not present

## 2017-08-23 DIAGNOSIS — H01024 Squamous blepharitis left upper eyelid: Secondary | ICD-10-CM | POA: Diagnosis not present

## 2017-08-23 DIAGNOSIS — H01022 Squamous blepharitis right lower eyelid: Secondary | ICD-10-CM | POA: Diagnosis not present

## 2017-08-23 DIAGNOSIS — H01021 Squamous blepharitis right upper eyelid: Secondary | ICD-10-CM | POA: Diagnosis not present

## 2017-09-04 DIAGNOSIS — H01021 Squamous blepharitis right upper eyelid: Secondary | ICD-10-CM | POA: Diagnosis not present

## 2017-09-04 DIAGNOSIS — H01022 Squamous blepharitis right lower eyelid: Secondary | ICD-10-CM | POA: Diagnosis not present

## 2017-09-04 DIAGNOSIS — H01024 Squamous blepharitis left upper eyelid: Secondary | ICD-10-CM | POA: Diagnosis not present

## 2017-09-04 DIAGNOSIS — H01025 Squamous blepharitis left lower eyelid: Secondary | ICD-10-CM | POA: Diagnosis not present

## 2017-09-04 DIAGNOSIS — H2513 Age-related nuclear cataract, bilateral: Secondary | ICD-10-CM | POA: Diagnosis not present

## 2017-09-24 NOTE — Progress Notes (Signed)
Michael Odonnell was seen today in the movement disorders clinic for neurologic consultation at the request of Reynold Bowen, MD.  This patient is accompanied in the office by his spouse who supplements the history.  Pt just moved from [facility].   The consultation is for the evaluation of rest tremor x 1 year.  He was seen by his primary care physician in [place] who started him on primidone for a diagnosis of "essential tremor."  This caused extreme loss of balance and the medication was discontinued.  09/25/17 update: Patient seen today in follow-up, accompanied by his wife who supplements the history.  It has been about 9 months since I last saw the patient.  MRI of the brain was ordered since our last visit, but the patient did not proceed with this.  He cancelled his appointment.  States that he is doing well. Did observe RSB class since last visit.   Records have been reviewed since last visit.  He saw gastroenterology in November.  No changes were made to his medication regimen.  He has an appt on 5/21 with dermatology for blepharitis and chronic inflammation.  Denies any swallowing issues but states "voice isn't real good."  He notes hypophonia.  No falls.  No diplopia  PREVIOUS MEDICATIONS: Primidone (loss of balance)  ALLERGIES:   Allergies  Allergen Reactions  . Codeine Nausea And Vomiting  . Metronidazole Nausea Only    CURRENT MEDICATIONS:  Outpatient Encounter Medications as of 09/25/2017  Medication Sig  . amLODipine (NORVASC) 5 MG tablet Take 5 mg by mouth daily.  . Cyanocobalamin (VITAMIN B-12 PO) Take by mouth.  . fluorometholone (FML) 0.1 % ophthalmic suspension SHAKE LQ AND INT 1 GTT IN OU TID  . LOTEMAX 0.5 % OINT APP SML AMT TO THE EYELID HS UTD  . magnesium gluconate (MAGONATE) 500 MG tablet Take 500 mg by mouth daily.  . metoprolol succinate (TOPROL-XL) 50 MG 24 hr tablet Take 50 mg by mouth daily. Take with or immediately following a meal.  . Multiple Vitamins-Minerals  (CENTRUM SILVER PO) Take 1 tablet by mouth daily.  . niacin 500 MG tablet Take 500 mg by mouth 2 (two) times daily.  . ondansetron (ZOFRAN) 4 MG tablet Take 4 mg by mouth every 8 (eight) hours as needed for nausea or vomiting.  . perindopril (ACEON) 8 MG tablet Take 8 mg by mouth daily.  . ranitidine (ZANTAC) 150 MG tablet Take 150 mg by mouth 2 (two) times daily.  . tamsulosin (FLOMAX) 0.4 MG CAPS capsule Take 1 capsule (0.4 mg total) by mouth daily.  Marland Kitchen UNABLE TO FIND CBD hemp extract drops 1000 mg BID   No facility-administered encounter medications on file as of 09/25/2017.     PAST MEDICAL HISTORY:   Past Medical History:  Diagnosis Date  . Arthritis   . Colon polyps   . Degenerative lumbar disc   . Diverticulosis   . GERD (gastroesophageal reflux disease)   . Hypertension   . Sciatica   . Tremor     PAST SURGICAL HISTORY:   Past Surgical History:  Procedure Laterality Date  . CLEFT LIP REPAIR    . CLEFT PALATE REPAIR      SOCIAL HISTORY:   Social History   Socioeconomic History  . Marital status: Married    Spouse name: Not on file  . Number of children: 2  . Years of education: Not on file  . Highest education level: Not on file  Occupational  History  . 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 Financial resource strain: Not on file  . Food insecurity:    Worry: Not on file    Inability: Not on file  . Transportation needs:    Medical: Not on file    Non-medical: Not on file  Tobacco Use  . Smoking status: Former Smoker    Types: Cigarettes    Last attempt to quit: 12/20/2012    Years since quitting: 4.7  . Smokeless tobacco: Never Used  Substance and Sexual Activity  . Alcohol use: No  . Drug use: No  . Sexual activity: Not on file  Lifestyle  . Physical activity:    Days per week: Not on file    Minutes per session: Not on file  . Stress: Not on file  Relationships  . Social  connections:    Talks on phone: Not on file    Gets together: Not on file    Attends religious service: Not on file    Active member of club or organization: Not on file    Attends meetings of clubs or organizations: Not on file    Relationship status: Not on file  . Intimate partner violence:    Fear of current or ex partner: Not on file    Emotionally abused: Not on file    Physically abused: Not on file    Forced sexual activity: Not on file  Other Topics Concern  . Not on file  Social History Narrative  . Not on file    FAMILY HISTORY:   Family Status  Relation Name Status  . Mother  Deceased  . Father  Deceased  . Sister  Alive  . Brother  Deceased  . Daughter x2 Alive    ROS:  A complete 10 system review of systems was obtained and was unremarkable apart from what is mentioned above.  PHYSICAL EXAMINATION:    VITALS:   Vitals:   09/25/17 1243  BP: 124/70  Pulse: (!) 54  SpO2: 98%  Weight: 169 lb 8 oz (76.9 kg)  Height: 5' 6.75" (1.695 m)    GEN:  The patient appears stated age and is in NAD. HEENT:  Normocephalic, atraumatic.  The mucous membranes are moist. The superficial temporal arteries are without ropiness or tenderness. CV:  Bradycardic.  Regular. Lungs:  CTAB Neck/HEME:  There are no carotid bruits bilaterally.  Neurological examination:  Orientation: The patient is alert and oriented x3.  Cranial nerves: There is good facial symmetry.  There is mild facial hypomimia.  Pupils are equal round and reactive to light bilaterally. Fundoscopic exam reveals clear margins bilaterally. Extraocular muscles are intact. There are square wave jerks.  The visual fields are full to confrontational testing. The speech is fluent and clear.   He is hypophonic and very pseudobulbar.  The patient is able to make the gutteral sounds without difficulty.  Soft palate rises symmetrically and there is no tongue deviation. Hearing is intact to conversational tone. Sensation:  Sensation is intact to light touch throughout Motor: Strength is 5/5 in the bilateral upper and lower extremities.   Shoulder shrug is equal and symmetric.  There is no pronator drift.  No fasiculations in tongue, arms, legs    Movement examination: Tone: There is normal tone in the bilateral upper extremities.  The tone in the lower extremities is normal.  Abnormal movements: There is independent R and  LUE resting tremor that increases with distraction.  Leg tremor bilaterally with distraction Coordination:  There is decremation with any form of RAMS, including alternating supination and pronation of the forearm, hand opening and closing, finger taps, heel taps and toe taps, R more than left Gait and Station: The patient is able to arise without the use of the hands.  Walks well down the hall.  Negative pull test.    Lab Results  Component Value Date   TSH 0.80 12/20/2016   Lab Results  Component Value Date   [id number] 539 12/20/2016   Patient did bring lab work to me dated June 12, 2017.  Sodium was 141, potassium 4.1, chloride 105, CO2 28, BUN 20 and creatinine 1.0.  AST is 20, ALT 17.   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  Discussed that treatments are generally the same, but prognosis is very different.  I am not sure if he has PSP and time will tell, but this certainly does look like an atypical state given progression of pseudobulbar speech.  I did not see any fasciculations to indicate motor neuron disease, nor is he weak.  After some discussion, we ultimately decided to add carbidopa/levodopa 25/100 and work to 1 tablet 3 times per day.  This may not be enough dosing, but it is a good starter dose.  Discussed extensively the short-term and long-term side effects of levodopa.  Understanding was expressed by patient and wife.  2.  Thrombocytopenia  -Being monitored by primary care.  3.   I will see the patient back in the next few months, sooner should new neurologic issues arise.  Much greater than 50% of this visit was spent in counseling and coordinating care.  Total face to face time:  25 min   Cc:  Reynold Bowen, MD

## 2017-09-25 ENCOUNTER — Ambulatory Visit (INDEPENDENT_AMBULATORY_CARE_PROVIDER_SITE_OTHER): Payer: Medicare Other | Admitting: Neurology

## 2017-09-25 ENCOUNTER — Encounter: Payer: Self-pay | Admitting: Neurology

## 2017-09-25 VITALS — BP 124/70 | HR 54 | Ht 66.75 in | Wt 169.5 lb

## 2017-09-25 DIAGNOSIS — G20 Parkinson's disease: Secondary | ICD-10-CM

## 2017-09-25 MED ORDER — CARBIDOPA-LEVODOPA 25-100 MG PO TABS: 1.0000 | ORAL_TABLET | Freq: Three times a day (TID) | ORAL | 1 refills | Status: DC

## 2017-09-25 NOTE — Patient Instructions (Addendum)
1. Start Carbidopa Levodopa as follows: (Take these doses at 6-7am, 10-11am, and 3-4pm)  Take 1/2 tablet three times daily, at least 30 minutes before meals, for one week  Then take 1/2 tablet in the morning, 1/2 tablet in the afternoon, 1 tablet in the evening, at least 30 minutes before meals, for one week  Then take 1/2 tablet in the morning, 1 tablet in the afternoon, 1 tablet in the evening, at least 30 minutes before meals, for one week  Then take 1 tablet three times daily, at least 30 minutes before meals

## 2017-09-26 ENCOUNTER — Ambulatory Visit: Payer: Medicare Other | Admitting: Neurology

## 2017-10-29 ENCOUNTER — Telehealth: Payer: Self-pay | Admitting: Neurology

## 2017-10-29 DIAGNOSIS — L718 Other rosacea: Secondary | ICD-10-CM | POA: Diagnosis not present

## 2017-10-29 NOTE — Telephone Encounter (Signed)
LMOM (ok per DPR) to let daughter know we would recommend PCP first. Lots of reasons for nausea, usually in PD can be medication related, but his medication has not been changed in over a month. To call back with any questions.

## 2017-10-29 NOTE — Telephone Encounter (Signed)
Patient's wife called and said that he has been very nauseated for about a week. She was not sure if it was related to his Parkinson's?  She would like for him to be seen this week. He is scheduled for a follow up in August but Dr. Carles Collet had an opening for this Thursday. Should he come in this week? Please Advise. Thanks

## 2017-10-30 NOTE — Progress Notes (Signed)
Michael Odonnell was seen today in the movement disorders clinic for neurologic consultation at the request of Reynold Bowen, MD.  This patient is accompanied in the office by his spouse who supplements the history.  Pt just moved from [facility].   The consultation is for the evaluation of rest tremor x 1 year.  He was seen by his primary care physician in [place] who started him on primidone for a diagnosis of "essential tremor."  This caused extreme loss of balance and the medication was discontinued.  09/25/17 update: Patient seen today in follow-up, accompanied by his wife who supplements the history.  It has been about 9 months since I last saw the patient.  MRI of the brain was ordered since our last visit, but the patient did not proceed with this.  He cancelled his appointment.  States that he is doing well. Did observe RSB class since last visit.   Records have been reviewed since last visit.  He saw gastroenterology in November.  No changes were made to his medication regimen.  He has an appt on 5/21 with dermatology for blepharitis and chronic inflammation.  Denies any swallowing issues but states "voice isn't real good."  He notes hypophonia.  No falls.  No diplopia  10/31/17 update: Patient is seen today, accompanied by his wife who supplements the history.  I just saw the patient last month.  I started him on carbidopa/levodopa 25/100 and he was to work to 1 tablet 3 times per day.  His wife called 10/29/17 stating that the patient was nauseated for 1 week and she wondered if it was related to Parkinson's disease.  It turns out he never even started the carbidopa/levodopa 25/100.  He was advised to follow-up with the primary care first given that the nausea wasn't felt from PD.  It turns out that he was placed on doxycycline and that is what caused the nausea.  He is off of that now and the nausea went away.  He has more tremor.   He has some dizziness if stands up too quickly.  Wife asks me about  helping him limit his med list.    PREVIOUS MEDICATIONS: Primidone (loss of balance)  ALLERGIES:   Allergies  Allergen Reactions  . Codeine Nausea And Vomiting  . Metronidazole Nausea Only    CURRENT MEDICATIONS:  Outpatient Encounter Medications as of 10/31/2017  Medication Sig  . amLODipine (NORVASC) 5 MG tablet Take 5 mg by mouth daily.  . Cyanocobalamin (VITAMIN B-12 PO) Take by mouth.  . doxycycline (VIBRAMYCIN) 50 MG capsule Take 1 capsule by mouth daily.  Marland Kitchen LOTEMAX 0.5 % OINT APP SML AMT TO THE EYELID HS UTD  . metoprolol succinate (TOPROL-XL) 50 MG 24 hr tablet Take 50 mg by mouth daily. Take with or immediately following a meal.  . Multiple Vitamins-Minerals (CENTRUM SILVER PO) Take 1 tablet by mouth daily.  . niacin 500 MG tablet Take 500 mg by mouth 2 (two) times daily.  . perindopril (ACEON) 8 MG tablet Take 8 mg by mouth daily.  . ranitidine (ZANTAC) 150 MG tablet Take 150 mg by mouth 2 (two) times daily.  . tamsulosin (FLOMAX) 0.4 MG CAPS capsule Take 1 capsule (0.4 mg total) by mouth daily.  Marland Kitchen UNABLE TO FIND CBD hemp extract drops 1000 mg BID  . [DISCONTINUED] carbidopa-levodopa (SINEMET IR) 25-100 MG tablet Take 1 tablet by mouth 3 (three) times daily.  . [DISCONTINUED] fluorometholone (FML) 0.1 % ophthalmic suspension SHAKE LQ  AND INT 1 GTT IN OU TID  . [DISCONTINUED] magnesium gluconate (MAGONATE) 500 MG tablet Take 500 mg by mouth daily.  . [DISCONTINUED] ondansetron (ZOFRAN) 4 MG tablet Take 4 mg by mouth every 8 (eight) hours as needed for nausea or vomiting.   No facility-administered encounter medications on file as of 10/31/2017.     PAST MEDICAL HISTORY:   Past Medical History:  Diagnosis Date  . Arthritis   . Colon polyps   . Degenerative lumbar disc   . Diverticulosis   . GERD (gastroesophageal reflux disease)   . Hypertension   . Sciatica   . Tremor     PAST SURGICAL HISTORY:   Past Surgical History:  Procedure Laterality Date  . CLEFT LIP  REPAIR    . CLEFT PALATE REPAIR      SOCIAL HISTORY:   Social History   Socioeconomic History  . Marital status: Married    Spouse name: Not on file  . Number of children: 2  . Years of education: Not on file  . Highest education level: Not on file  Occupational History  . Occupation: retired    Comment: Musician - [employer]  . Financial resource strain: Not on file  . Food insecurity:    Worry: Not on file    Inability: Not on file  . Transportation needs:    Medical: Not on file    Non-medical: Not on file  Tobacco Use  . Smoking status: Former Smoker    Types: Cigarettes    Last attempt to quit: 12/20/2012    Years since quitting: 4.8  . Smokeless tobacco: Never Used  Substance and Sexual Activity  . Alcohol use: No  . Drug use: No  . Sexual activity: Not on file  Lifestyle  . Physical activity:    Days per week: Not on file    Minutes per session: Not on file  . Stress: Not on file  Relationships  . Social connections:    Talks on phone: Not on file    Gets together: Not on file    Attends religious service: Not on file    Active member of club or organization: Not on file    Attends meetings of clubs or organizations: Not on file    Relationship status: Not on file  . Intimate partner violence:    Fear of current or ex partner: Not on file    Emotionally abused: Not on file    Physically abused: Not on file    Forced sexual activity: Not on file  Other Topics Concern  . Not on file  Social History Narrative  . Not on file    FAMILY HISTORY:   Family Status  Relation Name Status  . Mother  Deceased  . Father  Deceased  . Sister  Alive  . Brother  Deceased  . Daughter x2 Alive    ROS:  A complete 10 system review of systems was obtained and was unremarkable apart from what is mentioned above.  PHYSICAL EXAMINATION:    VITALS:   Vitals:   10/31/17 1425  BP: 128/62  Pulse: 64  SpO2: 95%  Weight:  164 lb (74.4 kg)  Height: 5\' 9"  (1.753 m)    GEN:  The patient appears stated age and is in NAD. HEENT:  Normocephalic, atraumatic.  The mucous membranes are moist. The superficial temporal arteries are without ropiness or tenderness. CV:  Bradycardic.  Regular. Lungs:  CTAB Neck/HEME:  There are no carotid bruits bilaterally.  Neurological examination:  Orientation: The patient is alert and oriented x3.  Cranial nerves: There is good facial symmetry.  There is mild facial hypomimia.  Pupils are equal round and reactive to light bilaterally. Fundoscopic exam reveals clear margins bilaterally. Extraocular muscles are intact. There are square wave jerks.  The visual fields are full to confrontational testing. The speech is fluent and clear.   He is hypophonic and very pseudobulbar (noted in past as well).  The patient is able to make the gutteral sounds without difficulty.  Soft palate rises symmetrically and there is no tongue deviation. Hearing is intact to conversational tone. Sensation: Sensation is intact to light touch throughout Motor: Strength is 5/5 in the bilateral upper and lower extremities.   Shoulder shrug is equal and symmetric.  There is no pronator drift.  There are a few rare fasciculations in the right gastrocnemius but none elsewhere (pt unrobed and was examined).  No tongue fasciculations.     Movement examination: Tone: There is normal tone in the bilateral upper extremities.  The tone in the lower extremities is normal.  Abnormal movements: There is independent R and LUE resting tremor that increases with distraction.  No leg tremor today Coordination:  There is decremation with any form of RAMS, including alternating supination and pronation of the forearm, hand opening and closing, finger taps, heel taps and toe taps, R more than left Gait and Station: The patient is able to arise without the use of the hands.  Walks well down the hall.  Negative pull test.    Lab  Results  Component Value Date   TSH 0.80 12/20/2016   Lab Results  Component Value Date   [id number] 12/20/2016   Patient did bring lab work to me dated June 12, 2017.  Sodium was 141, potassium 4.1, chloride 105, CO2 28, BUN 20 and creatinine 1.0.  AST is 20, ALT 17.   ASSESSMENT/PLAN:  1.  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  His speech is very pseudobulbar.  He was given levodopa last visit, but ultimately did not take it and is clear that he (his wife) does not wish to take it.  She brought in the package insert and we discussed the side effects which were more likely and others which were less likely, that she was worried about.  Regardless, they have opted to hold on medication.    -states that daughter told them to take B12.  His B12 is normal and there is no reason for taking it.    -refuses EMG test.  Discussed that motor neuron disease is still in the differential diagnosis, even though he has no weakness.  He doesn't wish to proceed  -wants to decrease his medication list and they gave me the medicine list to ask about.  I actually do not prescribe him any medications.  Told him he could certainly ask his primary care physician about his blood pressure medications and see if he still needs both of them, or if he still needs the same dosage as parkinsonism can decrease blood pressure.  He has noticed some lightheadedness.  He already has an appoint with his primary care physician in early July.  If the patient wants to decrease his medication list, however, they could discontinue the B12 (as above) as well as the hemp oil,  which really is not recommended in any parkinsonian state.  2.  Thrombocytopenia  -Being monitored by primary care.  3.  F/U 4 months.  Much greater than 50% of this visit was spent in counseling and coordinating care.  Total face to face  time:  25 min   Cc:  Reynold Bowen, MD

## 2017-10-31 ENCOUNTER — Ambulatory Visit (INDEPENDENT_AMBULATORY_CARE_PROVIDER_SITE_OTHER): Payer: Medicare Other | Admitting: Neurology

## 2017-10-31 ENCOUNTER — Encounter: Payer: Self-pay | Admitting: Neurology

## 2017-10-31 VITALS — BP 128/62 | HR 64 | Ht 69.0 in | Wt 164.0 lb

## 2017-10-31 DIAGNOSIS — G20 Parkinson's disease: Secondary | ICD-10-CM | POA: Diagnosis not present

## 2017-10-31 NOTE — Patient Instructions (Signed)
1.  You have opted not to do EMG.  You can let me know if you change your mind 2.  You have opted to hold on taking carbidopa/levodopa 25/100.  You can let me know if you change your mind 3.  You can talk with your PCP to see if you still need your blood pressure meds or as much dosage 4.  From a neurologic standpoint, you do not need the B12

## 2017-11-06 ENCOUNTER — Telehealth: Payer: Self-pay | Admitting: Internal Medicine

## 2017-11-06 NOTE — Telephone Encounter (Signed)
Patient wife states pt is having recurrent nausea and can not eat much. Pt wife wanting to know what to do until ov scheduled for 6.26.19. Pt last seen Dr.Perry 11.2018

## 2017-11-06 NOTE — Telephone Encounter (Signed)
Pt scheduled to see Alonza Bogus PA tomorrow at 2:30pm. Pts wife aware of appt.

## 2017-11-07 ENCOUNTER — Encounter: Payer: Self-pay | Admitting: Gastroenterology

## 2017-11-07 ENCOUNTER — Ambulatory Visit (INDEPENDENT_AMBULATORY_CARE_PROVIDER_SITE_OTHER): Payer: Medicare Other | Admitting: Gastroenterology

## 2017-11-07 VITALS — BP 110/60 | HR 78 | Ht 69.0 in | Wt 165.8 lb

## 2017-11-07 DIAGNOSIS — R110 Nausea: Secondary | ICD-10-CM

## 2017-11-07 MED ORDER — ONDANSETRON HCL 4 MG PO TABS: ORAL_TABLET | ORAL | 1 refills | Status: DC

## 2017-11-07 NOTE — Patient Instructions (Signed)
We have sent the following medications to your pharmacy for you to pick up at your convenience:  Zofran  

## 2017-11-07 NOTE — Progress Notes (Signed)
11/07/2017 Michael Odonnell [id number] Sep 19, 1940   HISTORY OF PRESENT ILLNESS:  This is a 77 year old male who is a patient of Dr. Blanch Media.  Was seen in 11/2016 with complaints of nausea.  Was thought due to medication at that time and was reported to be improving.  He returns today with ongoing/recurrent complaints.  Frequent nausea, no vomiting.  At this point he is not on many medications.  He thinks that it is from/related to the pain in his back when it gets severe.  he denies heartburn/reflux and tried zantac previously but says that it made it worse.  Moves his bowels regularly.  No abdominal pain.  Apparently had been given zofran to use when he was living in [facility] and is asking for that medication again.   Past Medical History:  Diagnosis Date  . Arthritis   . Colon polyps   . Degenerative lumbar disc   . Diverticulosis   . GERD (gastroesophageal reflux disease)   . Hypertension   . Sciatica   . Tremor    Past Surgical History:  Procedure Laterality Date  . CLEFT LIP REPAIR    . CLEFT PALATE REPAIR      reports that he quit smoking about 4 years ago. His smoking use included cigarettes. He has never used smokeless tobacco. He reports that he does not drink alcohol or use drugs. family history includes Heart attack in his brother; Heart attack (age of onset: 60) in his mother; Heart disease in his sister; Melanoma in his sister. Allergies  Allergen Reactions  . Codeine Nausea And Vomiting  . Metronidazole Nausea Only      Outpatient Encounter Medications as of 11/07/2017  Medication Sig  . amLODipine (NORVASC) 5 MG tablet Take 5 mg by mouth daily.  . Cyanocobalamin (VITAMIN B-12 PO) Take by mouth.  . LOTEMAX 0.5 % OINT APP SML AMT TO THE EYELID HS UTD  . metoprolol succinate (TOPROL-XL) 50 MG 24 hr tablet Take 50 mg by mouth daily. Take with or immediately following a meal.  . Multiple Vitamins-Minerals (CENTRUM SILVER PO) Take 1 tablet by mouth daily.  . niacin 500 MG  tablet Take 500 mg by mouth 2 (two) times daily.  . perindopril (ACEON) 8 MG tablet Take 8 mg by mouth daily.  . tamsulosin (FLOMAX) 0.4 MG CAPS capsule Take 1 capsule (0.4 mg total) by mouth daily.  Marland Kitchen UNABLE TO FIND CBD hemp extract drops 1000 mg BID  . [DISCONTINUED] doxycycline (VIBRAMYCIN) 50 MG capsule Take 1 capsule by mouth daily.  . [DISCONTINUED] ranitidine (ZANTAC) 150 MG tablet Take 150 mg by mouth 2 (two) times daily.   No facility-administered encounter medications on file as of 11/07/2017.      REVIEW OF SYSTEMS  : All other systems reviewed and negative except where noted in the History of Present Illness.   PHYSICAL EXAM: BP 110/60   Pulse 78   Ht 5\' 9"  (1.753 m)   Wt 165 lb 12.8 oz (75.2 kg)   BMI 24.48 kg/m  General: Well developed white male in no acute distress Head: Normocephalic and atraumatic Eyes:  Sclerae anicteric, conjunctiva pink. Ears: Normal auditory acuity Lungs: Clear throughout to auscultation; no increased WOB. Heart: Regular rate and rhythm; no M/R/G. Abdomen: Soft, non-distended.  BS present.  Non-tender. Musculoskeletal: Symmetrical with no gross deformities  Skin: No lesions on visible extremities Extremities: No edema  Neurological: Alert oriented x 4, grossly non-focal Psychological:  Alert and cooperative. Normal  mood and affect  ASSESSMENT AND PLAN: *Nausea, recurrent/ongoing:  Previously thought due to medication.  Not on many medications currently.  He thinks that it is from/related to the pain in his back.  We discussed the nature of nausea and how it can be very hard and complicated to determine source.  Not always a GI issue.  From a GI standpoint could evaluate and treat for reflux, ulcer.  We discussed trying PPI (tried zantac previously and says that it made it worse) and possible endoscopy.  Also discussed possible imaging of his head evaluation/management by his PCP or neurology.  It was recommended that he have an MRI of his head  previously (by neuro) but he declined due to claustrophobia.  Previously responded to Zofran.  Is asking for that medication again.  Will give Zofran to use on a as needed basis.  He has a follow-up scheduled with Dr. Henrene Pastor at the end of June, which he wishes to keep at this time.  **25 minutes spent with the patient in which at least 50% was used to discuss options for evaluation and treatment.   CC:  Reynold Bowen, MD

## 2017-11-08 NOTE — Progress Notes (Signed)
Assessment and plans reviewed  

## 2017-11-14 DIAGNOSIS — M545 Low back pain: Secondary | ICD-10-CM | POA: Diagnosis not present

## 2017-11-14 DIAGNOSIS — H8113 Benign paroxysmal vertigo, bilateral: Secondary | ICD-10-CM | POA: Diagnosis not present

## 2017-11-18 DIAGNOSIS — H01022 Squamous blepharitis right lower eyelid: Secondary | ICD-10-CM | POA: Diagnosis not present

## 2017-11-18 DIAGNOSIS — H01025 Squamous blepharitis left lower eyelid: Secondary | ICD-10-CM | POA: Diagnosis not present

## 2017-11-18 DIAGNOSIS — H02135 Senile ectropion of left lower eyelid: Secondary | ICD-10-CM | POA: Diagnosis not present

## 2017-11-18 DIAGNOSIS — H02132 Senile ectropion of right lower eyelid: Secondary | ICD-10-CM | POA: Diagnosis not present

## 2017-11-18 DIAGNOSIS — H01021 Squamous blepharitis right upper eyelid: Secondary | ICD-10-CM | POA: Diagnosis not present

## 2017-11-18 DIAGNOSIS — H01024 Squamous blepharitis left upper eyelid: Secondary | ICD-10-CM | POA: Diagnosis not present

## 2017-11-18 DIAGNOSIS — H2513 Age-related nuclear cataract, bilateral: Secondary | ICD-10-CM | POA: Diagnosis not present

## 2017-11-21 DIAGNOSIS — H8113 Benign paroxysmal vertigo, bilateral: Secondary | ICD-10-CM | POA: Diagnosis not present

## 2017-11-21 DIAGNOSIS — M545 Low back pain: Secondary | ICD-10-CM | POA: Diagnosis not present

## 2017-12-04 ENCOUNTER — Encounter (INDEPENDENT_AMBULATORY_CARE_PROVIDER_SITE_OTHER): Payer: Self-pay

## 2017-12-04 ENCOUNTER — Encounter: Payer: Self-pay | Admitting: Internal Medicine

## 2017-12-04 ENCOUNTER — Ambulatory Visit (INDEPENDENT_AMBULATORY_CARE_PROVIDER_SITE_OTHER): Payer: Medicare Other | Admitting: Internal Medicine

## 2017-12-04 VITALS — BP 104/58 | HR 63 | Ht 67.0 in | Wt 158.8 lb

## 2017-12-04 DIAGNOSIS — R110 Nausea: Secondary | ICD-10-CM | POA: Diagnosis not present

## 2017-12-04 NOTE — Patient Instructions (Signed)
You have been scheduled for an endoscopy. Please follow written instructions given to you at your visit today. If you use inhalers (even only as needed), please bring them with you on the day of your procedure. Your physician has requested that you go to www.startemmi.com and enter the access code given to you at your visit today. This web site gives a general overview about your procedure. However, you should still follow specific instructions given to you by our office regarding your preparation for the procedure.   Take your Zofran three times a day.

## 2017-12-04 NOTE — Progress Notes (Signed)
HISTORY OF PRESENT ILLNESS:  Michael Odonnell is a 77 y.o. male who has been seen in this office on 2 prior occasions regarding chronic nausea without vomiting. See those dictations. He has Parkinson's disease and is on multiple medications. At time of his most recent GI evaluation approximately 4 weeks ago he was restarted on Zofran as needed. He uses this no more than once daily and it seems to help somewhat. PPI was suggested. This was started the other day. Now taking omeprazole. Patient has no abdominal pain. No pyrosis. No dysphagia. No prior upper endoscopy. Review of blood work from Dr. Forde Dandy earlier that she was unremarkable. He has aged out of surveillance colonoscopy program.the patient is followed by neurology for his Parkinson's disease. He was to have an MRI of the brain to evaluate his nausea but did not secondary to claustrophobia. He does tell me that he has a constant low back pain which she feels may contribute to his nausea... He is accompanied by his wife  REVIEW OF SYSTEMS:  All non-GI ROS negative except for arthritis, back pain, fatigue, sleeping problems, voice change  Past Medical History:  Diagnosis Date  . Arthritis   . Colon polyps   . Degenerative lumbar disc   . Diverticulosis   . GERD (gastroesophageal reflux disease)   . Hypertension   . Sciatica   . Tremor     Past Surgical History:  Procedure Laterality Date  . CLEFT LIP REPAIR    . CLEFT PALATE REPAIR      Social History Hernando Reali  reports that he quit smoking about 4 years ago. His smoking use included cigarettes. He has never used smokeless tobacco. He reports that he does not drink alcohol or use drugs.  family history includes Heart attack in his brother; Heart attack (age of onset: 46) in his mother; Heart disease in his sister; Melanoma in his sister.  Allergies  Allergen Reactions  . Codeine Nausea And Vomiting  . Doxycycline Nausea Only  . Metronidazole Nausea Only       PHYSICAL  EXAMINATION: Vital signs: BP (!) 104/58   Pulse 63   Ht 5\' 7"  (1.702 m)   Wt 158 lb 12.8 oz (72 kg)   BMI 24.87 kg/m   Constitutional: somewhat frail elderly male, no acute distress Psychiatric: alert and oriented x3, cooperative Eyes: extraocular movements intact, anicteric, conjunctiva pink Mouth: oral pharynx moist, no lesions Neck: supple no lymphadenopathy Cardiovascular: heart regular rate and rhythm, no murmur Lungs: clear to auscultation bilaterally Abdomen: soft, nontender, nondistended, no obvious ascites, no peritoneal signs, normal bowel sounds, no organomegaly Rectal:omitted Extremities: no lower extremity edema bilaterally Skin: no lesions on visible extremities Neuro: obvious resting tremor. Cranial nerves intact  ASSESSMENT:  #1. Chronic nausea without vomiting. We discussed potential GI etiologies including Candida esophagitis, H. Pylori gastritis, pyloric stenosis, occult gallbladder disease, pyloric channel ulcer, and gastroparesis. We also discussed multiple non-GI etiologies including chronic illness, pain, neurologic issues functional (anxiety/depression), and medication reaction to name a few #2. Colon cancer surveillance. Aged out  PLAN:  #1. Continue PPI as this would help potential acid peptic disorders #2. Schedule upper endoscopy to rule out upper GI mucosal disorders #3. Recommend using Zofran on a running schedule such as 3 times daily to see if this improves his symptoms and quality of life #4. Consider gastric emptying scan if upper endoscopy unrevealing #5. Considered abdominal ultrasound to evaluate the gallbladder if upper endoscopy unrevealing #6. If GI etiology is ruled out,  then return to PCP to evaluate for the above listed non-GI causes for nausea as well as others.

## 2017-12-05 ENCOUNTER — Ambulatory Visit (AMBULATORY_SURGERY_CENTER): Payer: Medicare Other | Admitting: Internal Medicine

## 2017-12-05 ENCOUNTER — Other Ambulatory Visit: Payer: Self-pay

## 2017-12-05 ENCOUNTER — Encounter: Payer: Self-pay | Admitting: Internal Medicine

## 2017-12-05 VITALS — BP 124/46 | HR 67 | Temp 98.9°F | Resp 15 | Ht 67.0 in | Wt 158.0 lb

## 2017-12-05 DIAGNOSIS — K317 Polyp of stomach and duodenum: Secondary | ICD-10-CM

## 2017-12-05 DIAGNOSIS — K219 Gastro-esophageal reflux disease without esophagitis: Secondary | ICD-10-CM | POA: Diagnosis not present

## 2017-12-05 DIAGNOSIS — R110 Nausea: Secondary | ICD-10-CM | POA: Diagnosis not present

## 2017-12-05 DIAGNOSIS — I10 Essential (primary) hypertension: Secondary | ICD-10-CM | POA: Diagnosis not present

## 2017-12-05 MED ORDER — SODIUM CHLORIDE 0.9 % IV SOLN: 500.0000 mL | Freq: Once | INTRAVENOUS | Status: DC

## 2017-12-05 NOTE — Progress Notes (Signed)
Pt. Reports no change in his medical or surgical history since his pre-visit 12/04/2017.

## 2017-12-05 NOTE — Progress Notes (Signed)
Called to room to assist during endoscopic procedure.  Patient ID and intended procedure confirmed with present staff. Received instructions for my participation in the procedure from the performing physician.  

## 2017-12-05 NOTE — Op Note (Signed)
Michael Odonnell Patient Name: Michael Odonnell Procedure Date: 12/05/2017 10:44 AM MRN: [id number] Endoscopist: Docia Chuck. Michael Odonnell , MD Age: 77 Referring MD:  Date of Birth: 07-Jan-1941 Gender: Male Account #: [id number] Procedure:                Upper GI endoscopy, with biopsies Indications:              Nausea Medicines:                Monitored Anesthesia Care Procedure:                Pre-Anesthesia Assessment:                           - Prior to the procedure, a History and Physical                            was performed, and patient medications and                            allergies were reviewed. The patient's tolerance of                            previous anesthesia was also reviewed. The risks                            and benefits of the procedure and the sedation                            options and risks were discussed with the patient.                            All questions were answered, and informed consent                            was obtained. Prior Anticoagulants: The patient has                            taken no previous anticoagulant or antiplatelet                            agents. ASA Grade Assessment: II - A patient with                            mild systemic disease. After reviewing the risks                            and benefits, the patient was deemed in                            satisfactory condition to undergo the procedure.                           After obtaining informed consent, the endoscope was  passed under direct vision. Throughout the                            procedure, the patient's blood pressure, pulse, and                            oxygen saturations were monitored continuously. The                            Endoscope was introduced through the mouth, and                            advanced to the second part of duodenum. The upper                            GI endoscopy was accomplished  without difficulty.                            The patient tolerated the procedure well. Scope In: Scope Out: Findings:                 The esophagus was normal.                           A few small pedunculated polyps were found in the                            stomach. Biopsies were taken with a cold forceps                            for histology. Stomach was otherwise normal.                           The examined duodenum was normal.                           The cardia and gastric fundus were normal on                            retroflexion. Complications:            No immediate complications. Estimated Blood Loss:     Estimated blood loss: none. Impression:               1. Essentially normal EGD                           2. Incidental small gastric polyps. Benign                            appearing. Biopsy                           3. No obvious cause for chronic nausea. Recommendation:           1. Continue omeprazole 20 mg daily until your  follow-up                           2. Continue Zofran 3 times daily until your                            follow-up                           3. Schedule abdominal ultrasound "chronic nausea,                            rule out gallbladder disease"                           4. Schedule solid-phase gastric emptying scan                            "chronic nausea, rule out gastroparesis"                           5. Office follow-up with Dr. Henrene Odonnell in 2 months. Docia Chuck. Michael Pastor, MD 12/05/2017 10:55:41 AM This report has been signed electronically.

## 2017-12-05 NOTE — Patient Instructions (Signed)
CONTINUE OMEPRAZOLE 20 MG DAILY UNTIL FOLLOW UP APPOINTMENT  CONTINUE ZOFRAN 3 TIMES DAILY UNTIL FOLLOW UP APPOINTMENT    ULTRASOUND AND GASTRIC EMPTYING SCAN WILL BE SCHEDULED AND DR PERRY'S OFFICE WILL NOTIFY YOU OD DATE ,TIME,AND DETAILS  FOLLOW UP APPOINTMENT WILL BE MADE WITH DR PERRY FOR 2 MONTHS FROM NOW OR YOU CAN GO AHEAD AND MAKE THIS IF YOU'D LIKE    AWAIT BIOPSY RESULTS     YOU HAD AN ENDOSCOPIC PROCEDURE TODAY AT [facility]:   Refer to the procedure report that was given to you for any specific questions about what was found during the examination.  If the procedure report does not answer your questions, please call your gastroenterologist to clarify.  If you requested that your care partner not be given the details of your procedure findings, then the procedure report has been included in a sealed envelope for you to review at your convenience later.  YOU SHOULD EXPECT: Some feelings of bloating in the abdomen. Passage of more gas than usual.  Walking can help get rid of the air that was put into your GI tract during the procedure and reduce the bloating. If you had a lower endoscopy (such as a colonoscopy or flexible sigmoidoscopy) you may notice spotting of blood in your stool or on the toilet paper. If you underwent a bowel prep for your procedure, you may not have a normal bowel movement for a few days.  Please Note:  You might notice some irritation and congestion in your nose or some drainage.  This is from the oxygen used during your procedure.  There is no need for concern and it should clear up in a day or so.  SYMPTOMS TO REPORT IMMEDIATELY:    Following upper endoscopy (EGD)  Vomiting of blood or coffee ground material  New chest pain or pain under the shoulder blades  Painful or persistently difficult swallowing  New shortness of breath  Fever of 100F or higher  Black, tarry-looking stools  For urgent or emergent issues, a  gastroenterologist can be reached at any hour by calling [phone].   DIET:  We do recommend a small meal at first, but then you may proceed to your regular diet.  Drink plenty of fluids but you should avoid alcoholic beverages for 24 hours.  ACTIVITY:  You should plan to take it easy for the rest of today and you should NOT DRIVE or use heavy machinery until tomorrow (because of the sedation medicines used during the test).    FOLLOW UP: Our staff will call the number listed on your records the next business day following your procedure to check on you and address any questions or concerns that you may have regarding the information given to you following your procedure. If we do not reach you, we will leave a message.  However, if you are feeling well and you are not experiencing any problems, there is no need to return our call.  We will assume that you have returned to your regular daily activities without incident.  If any biopsies were taken you will be contacted by phone or by letter within the next 1-3 weeks.  Please call us at [phone] if you have not heard about the biopsies in 3 weeks.    SIGNATURES/CONFIDENTIALITY: You and/or your care partner have signed paperwork which will be entered into your electronic medical record.  These signatures attest to the fact that that the information above  on your After Visit Summary has been reviewed and is understood.  Full responsibility of the confidentiality of this discharge information lies with you and/or your care-partner.

## 2017-12-05 NOTE — Progress Notes (Signed)
Report to PACU, RN, vss, BBS= Clear.  

## 2017-12-06 ENCOUNTER — Other Ambulatory Visit: Payer: Self-pay

## 2017-12-06 ENCOUNTER — Telehealth: Payer: Self-pay

## 2017-12-06 ENCOUNTER — Telehealth: Payer: Self-pay | Admitting: *Deleted

## 2017-12-06 DIAGNOSIS — R110 Nausea: Secondary | ICD-10-CM

## 2017-12-06 NOTE — Telephone Encounter (Signed)
Per the procedure note, I set him up for an ultrasound and a gastric emptying scan on 12-26-17 at [facility], arrive at 7:45 am with first test at 8:00am.  Both tests will be done on this day.  Wife is aware of all of this.

## 2017-12-06 NOTE — Telephone Encounter (Signed)
  Follow up Call-  Call back number 12/05/2017  Post procedure Call Back phone  # [phone]  Permission to leave phone message Yes  Some recent data might be hidden     Patient questions:  Do you have a fever, pain , or abdominal swelling? No. Pain Score  0 *  Have you tolerated food without any problems? Yes.    Have you been able to return to your normal activities? Yes.    Do you have any questions about your discharge instructions: Diet   No. Medications  No. Follow up visit  No.  Do you have questions or concerns about your Care? No.  Actions: * If pain score is 4 or above: No action needed, pain <4.

## 2017-12-09 ENCOUNTER — Telehealth: Payer: Self-pay | Admitting: Internal Medicine

## 2017-12-09 ENCOUNTER — Encounter: Payer: Self-pay | Admitting: Internal Medicine

## 2017-12-09 DIAGNOSIS — I10 Essential (primary) hypertension: Secondary | ICD-10-CM | POA: Diagnosis not present

## 2017-12-09 DIAGNOSIS — Z125 Encounter for screening for malignant neoplasm of prostate: Secondary | ICD-10-CM | POA: Diagnosis not present

## 2017-12-09 MED ORDER — ONDANSETRON HCL 4 MG PO TABS: 4.0000 mg | ORAL_TABLET | Freq: Three times a day (TID) | ORAL | 1 refills | Status: DC

## 2017-12-09 NOTE — Telephone Encounter (Signed)
zofran refilled.

## 2017-12-16 ENCOUNTER — Telehealth: Payer: Self-pay | Admitting: Internal Medicine

## 2017-12-16 DIAGNOSIS — E7849 Other hyperlipidemia: Secondary | ICD-10-CM | POA: Diagnosis not present

## 2017-12-16 DIAGNOSIS — J449 Chronic obstructive pulmonary disease, unspecified: Secondary | ICD-10-CM | POA: Diagnosis not present

## 2017-12-16 DIAGNOSIS — Z6823 Body mass index (BMI) 23.0-23.9, adult: Secondary | ICD-10-CM | POA: Diagnosis not present

## 2017-12-16 DIAGNOSIS — I10 Essential (primary) hypertension: Secondary | ICD-10-CM | POA: Diagnosis not present

## 2017-12-16 DIAGNOSIS — R110 Nausea: Secondary | ICD-10-CM | POA: Diagnosis not present

## 2017-12-16 DIAGNOSIS — D696 Thrombocytopenia, unspecified: Secondary | ICD-10-CM | POA: Diagnosis not present

## 2017-12-16 DIAGNOSIS — G20 Parkinson's disease: Secondary | ICD-10-CM | POA: Diagnosis not present

## 2017-12-16 DIAGNOSIS — R7309 Other abnormal glucose: Secondary | ICD-10-CM | POA: Diagnosis not present

## 2017-12-16 DIAGNOSIS — N401 Enlarged prostate with lower urinary tract symptoms: Secondary | ICD-10-CM | POA: Diagnosis not present

## 2017-12-16 MED ORDER — ONDANSETRON HCL 4 MG PO TABS: 4.0000 mg | ORAL_TABLET | Freq: Three times a day (TID) | ORAL | 1 refills | Status: DC

## 2017-12-16 NOTE — Telephone Encounter (Signed)
Resent Zofran

## 2017-12-26 ENCOUNTER — Ambulatory Visit (HOSPITAL_COMMUNITY)
Admission: RE | Admit: 2017-12-26 | Discharge: 2017-12-26 | Disposition: A | Discharge status: Home / Self Care | Payer: Medicare Other | Source: Ambulatory Visit | Attending: Internal Medicine | Admitting: Internal Medicine

## 2017-12-26 ENCOUNTER — Encounter (HOSPITAL_COMMUNITY)
Admission: RE | Admit: 2017-12-26 | Discharge: 2017-12-26 | Disposition: A | Discharge status: Home / Self Care | Payer: Medicare Other | Source: Ambulatory Visit | Attending: Internal Medicine | Admitting: Internal Medicine

## 2017-12-26 DIAGNOSIS — R110 Nausea: Secondary | ICD-10-CM | POA: Insufficient documentation

## 2017-12-26 DIAGNOSIS — R109 Unspecified abdominal pain: Secondary | ICD-10-CM | POA: Diagnosis not present

## 2017-12-26 DIAGNOSIS — N281 Cyst of kidney, acquired: Secondary | ICD-10-CM | POA: Diagnosis not present

## 2017-12-26 MED ORDER — TECHNETIUM TC 99M SULFUR COLLOID
2.0000 | Freq: Once | INTRAVENOUS | Status: AC | PRN
  Administered 2017-12-26: 2 via INTRAVENOUS

## 2018-01-07 DIAGNOSIS — H02115 Cicatricial ectropion of left lower eyelid: Secondary | ICD-10-CM | POA: Diagnosis not present

## 2018-01-30 ENCOUNTER — Encounter: Payer: Self-pay | Admitting: Internal Medicine

## 2018-01-30 ENCOUNTER — Ambulatory Visit (INDEPENDENT_AMBULATORY_CARE_PROVIDER_SITE_OTHER): Payer: Medicare Other | Admitting: Internal Medicine

## 2018-01-30 ENCOUNTER — Ambulatory Visit: Payer: Medicare Other | Admitting: Internal Medicine

## 2018-01-30 VITALS — BP 120/58 | HR 63 | Ht 66.75 in | Wt 155.0 lb

## 2018-01-30 DIAGNOSIS — R109 Unspecified abdominal pain: Secondary | ICD-10-CM | POA: Diagnosis not present

## 2018-01-30 DIAGNOSIS — R110 Nausea: Secondary | ICD-10-CM | POA: Diagnosis not present

## 2018-01-30 MED ORDER — OMEPRAZOLE 20 MG PO CPDR: 20.0000 mg | DELAYED_RELEASE_CAPSULE | Freq: Every day | ORAL | 11 refills | Status: DC

## 2018-01-30 MED ORDER — ONDANSETRON HCL 4 MG PO TABS: 4.0000 mg | ORAL_TABLET | Freq: Three times a day (TID) | ORAL | 3 refills | Status: DC

## 2018-01-30 NOTE — Progress Notes (Signed)
HISTORY OF PRESENT ILLNESS:  Michael Odonnell is a 77 y.o. male but has been evaluated in this office on several occasions for chronic nausea. He has a history of Parkinson's disease and arthritis. He has undergone extensive GI evaluation to rule out GI causes for his chronic nausea including laboratories, upper endoscopy, abdominal ultrasound, and gastric emptying study. All of these were unremarkable. He was last seen in this office 12/04/2017. He was empirically placed on omeprazole 20 mg daily and Zofran 4 mg 3 times daily running. He presents for follow-up at this time. He is accompanied by his wife. They're pleased to report that his nausea is markedly improved as is his general appetite. Despite this, he has lost 3 pounds since his last visit. GI complaints include flatus and intermittent mid abdominal cramping which is promptly relieved with antacids. No new complaints, though he does tell me that he is anticipating therapy for his Parkinson's disease.  REVIEW OF SYSTEMS:  All non-GI ROS negative except for arthritis, back pain, voice change  Past Medical History:  Diagnosis Date  . Arthritis   . Colon polyps   . Degenerative lumbar disc   . Diverticulosis   . GERD (gastroesophageal reflux disease)   . Hypertension   . Sciatica   . Tremor     Past Surgical History:  Procedure Laterality Date  . CLEFT LIP REPAIR    . CLEFT PALATE REPAIR      Social History Obert Espindola  reports that he quit smoking about 5 years ago. His smoking use included cigarettes. He has never used smokeless tobacco. He reports that he does not drink alcohol or use drugs.  family history includes Heart attack in his brother; Heart attack (age of onset: 32) in his mother; Heart disease in his sister; Melanoma in his sister.  Allergies  Allergen Reactions  . Codeine Nausea And Vomiting  . Doxycycline Nausea Only  . Metronidazole Nausea Only       PHYSICAL EXAMINATION: Vital signs: 120/58, 63, 155  pounds  Constitutional: generally well-appearing, no acute distress. Psychiatric: alert and oriented x3, cooperative Eyes: extraocular movements intact, anicteric, conjunctiva pink Mouth: oral pharynx moist, no lesions Neck: supple no lymphadenopathy Cardiovascular: heart regular rate and rhythm, no murmur Lungs: clear to auscultation bilaterally Abdomen: soft, nontender, nondistended, no obvious ascites, no peritoneal signs, normal bowel sounds, no organomegaly Rectal:omitted Extremities: no clubbing, cyanosis, or lower extremity edema bilaterally Skin: no lesions on visible extremities Neuro: bilateral resting hand tremor.   ASSESSMENT:  #1. Chronic nausea. No GI cause found despite extensive GI workup. Suspect central or functional  Etiology. #2. Nonspecific short lived abdominal cramping   PLAN:  #1. Continue empiric PPI by chance the patient has asked sensitivity contributing to nausea #2. Continue Zofran 3 times daily. I do believe this is why he feels better. His prescription has been refilled #3. Resume care with Dr. Forde Dandy for general medical needs and Dr. Carles Collet regarding his neurologic issues. GI follow-up as needed  25 minutes spent face-to-face with the patient. Greater than 50% a time use for counseling and answering questions regarding his issues with chronic nausea and nonspecific abdominal cramping

## 2018-01-30 NOTE — Patient Instructions (Signed)
We have sent the following medications to your pharmacy for you to pick up at your convenience:  Omeprazole and Zofran  Please return to the care of your PCP and follow up as needed

## 2018-02-04 DIAGNOSIS — H02115 Cicatricial ectropion of left lower eyelid: Secondary | ICD-10-CM | POA: Diagnosis not present

## 2018-02-04 DIAGNOSIS — H02135 Senile ectropion of left lower eyelid: Secondary | ICD-10-CM | POA: Diagnosis not present

## 2018-02-04 DIAGNOSIS — G20 Parkinson's disease: Secondary | ICD-10-CM | POA: Diagnosis not present

## 2018-02-04 DIAGNOSIS — Z87891 Personal history of nicotine dependence: Secondary | ICD-10-CM | POA: Diagnosis not present

## 2018-02-04 DIAGNOSIS — I10 Essential (primary) hypertension: Secondary | ICD-10-CM | POA: Diagnosis not present

## 2018-02-04 DIAGNOSIS — Z79899 Other long term (current) drug therapy: Secondary | ICD-10-CM | POA: Diagnosis not present

## 2018-02-06 ENCOUNTER — Encounter

## 2018-02-06 ENCOUNTER — Ambulatory Visit: Payer: Medicare Other | Admitting: Neurology

## 2018-03-04 DIAGNOSIS — L853 Xerosis cutis: Secondary | ICD-10-CM | POA: Diagnosis not present

## 2018-03-04 DIAGNOSIS — L570 Actinic keratosis: Secondary | ICD-10-CM | POA: Diagnosis not present

## 2018-03-04 DIAGNOSIS — Z85828 Personal history of other malignant neoplasm of skin: Secondary | ICD-10-CM | POA: Diagnosis not present

## 2018-03-05 NOTE — Progress Notes (Signed)
Michael Odonnell was seen today in the movement disorders clinic for neurologic consultation at the request of Reynold Bowen, MD.  This patient is accompanied in the office by his spouse who supplements the history.  Pt just moved from [facility].   The consultation is for the evaluation of rest tremor x 1 year.  He was seen by his primary care physician in [place] who started him on primidone for a diagnosis of "essential tremor."  This caused extreme loss of balance and the medication was discontinued.  09/25/17 update: Patient seen today in follow-up, accompanied by his wife who supplements the history.  It has been about 9 months since I last saw the patient.  MRI of the brain was ordered since our last visit, but the patient did not proceed with this.  He cancelled his appointment.  States that he is doing well. Did observe RSB class since last visit.   Records have been reviewed since last visit.  He saw gastroenterology in November.  No changes were made to his medication regimen.  He has an appt on 5/21 with dermatology for blepharitis and chronic inflammation.  Denies any swallowing issues but states "voice isn't real good."  He notes hypophonia.  No falls.  No diplopia  10/31/17 update: Patient is seen today, accompanied by his wife who supplements the history.  I just saw the patient last month.  I started him on carbidopa/levodopa 25/100 and he was to work to 1 tablet 3 times per day.  His wife called 10/29/17 stating that the patient was nauseated for 1 week and she wondered if it was related to Parkinson's disease.  It turns out he never even started the carbidopa/levodopa 25/100.  He was advised to follow-up with the primary care first given that the nausea wasn't felt from PD.  It turns out that he was placed on doxycycline and that is what caused the nausea.  He is off of that now and the nausea went away.  He has more tremor.   He has some dizziness if stands up too quickly.  Wife asks me about  helping him limit his med list.    03/07/18 update: Patient is seen in follow-up for atypical parkinsonism, accompanied by his wife who supplements the history.  He is on no medication, which is by their choice.  No falls.  He does some home PT exercises.  No hallucinations or visual distortions.  Records are reviewed since our last visit.  He has been to GI multiple times with nausea, but the last time he was there in August, he reported that nausea was much better.  He states that he still has it some.    PREVIOUS MEDICATIONS: Primidone (loss of balance)  ALLERGIES:   Allergies  Allergen Reactions  . Codeine Nausea And Vomiting  . Doxycycline Nausea Only  . Metronidazole Nausea Only    CURRENT MEDICATIONS:  Outpatient Encounter Medications as of 03/07/2018  Medication Sig  . acetaminophen (TYLENOL) 500 MG tablet Take 1,000 mg by mouth every 6 (six) hours as needed.  Marland Kitchen amLODipine (NORVASC) 5 MG tablet Take 5 mg by mouth daily.  . Calcium Carbonate Antacid (TUMS PO) Take by mouth.  . magnesium 30 MG tablet Take 30 mg by mouth daily.  . metoprolol succinate (TOPROL-XL) 50 MG 24 hr tablet Take 50 mg by mouth daily. Take with or immediately following a meal.  . Multiple Vitamins-Minerals (CENTRUM SILVER 50+MEN) TABS Take by mouth daily.  . niacin  500 MG tablet Take 500 mg by mouth 2 (two) times daily.  Marland Kitchen omeprazole (PRILOSEC) 20 MG capsule Take 1 capsule (20 mg total) by mouth daily.  . ondansetron (ZOFRAN) 4 MG tablet Take 1 tablet (4 mg total) by mouth 3 (three) times daily. 1 tablet every 6-8 hours as needed  . perindopril (ACEON) 8 MG tablet Take 8 mg by mouth daily.  . polyethylene glycol (MIRALAX / GLYCOLAX) packet Take 17 g by mouth daily as needed.  . tamsulosin (FLOMAX) 0.4 MG CAPS capsule Take 1 capsule (0.4 mg total) by mouth daily.  . [DISCONTINUED] UNABLE TO FIND CBD hemp extract drops 1000 mg BID  . [DISCONTINUED] 0.9 %  sodium chloride infusion    No facility-administered  encounter medications on file as of 03/07/2018.     PAST MEDICAL HISTORY:   Past Medical History:  Diagnosis Date  . Arthritis   . Colon polyps   . Degenerative lumbar disc   . Diverticulosis   . GERD (gastroesophageal reflux disease)   . Hypertension   . Sciatica   . Tremor     PAST SURGICAL HISTORY:   Past Surgical History:  Procedure Laterality Date  . CLEFT LIP REPAIR    . CLEFT PALATE REPAIR      SOCIAL HISTORY:   Social History   Socioeconomic History  . Marital status: Married    Spouse name: Not on file  . Number of children: 2  . Years of education: Not on file  . Highest education level: Not on file  Occupational History  . Occupation: retired    Comment: Musician - [employer]  . Financial resource strain: Not on file  . Food insecurity:    Worry: Not on file    Inability: Not on file  . Transportation needs:    Medical: Not on file    Non-medical: Not on file  Tobacco Use  . Smoking status: Former Smoker    Types: Cigarettes    Last attempt to quit: 12/20/2012    Years since quitting: 5.2  . Smokeless tobacco: Never Used  Substance and Sexual Activity  . Alcohol use: No  . Drug use: No  . Sexual activity: Not Currently  Lifestyle  . Physical activity:    Days per week: Not on file    Minutes per session: Not on file  . Stress: Not on file  Relationships  . Social connections:    Talks on phone: Not on file    Gets together: Not on file    Attends religious service: Not on file    Active member of club or organization: Not on file    Attends meetings of clubs or organizations: Not on file    Relationship status: Not on file  . Intimate partner violence:    Fear of current or ex partner: Not on file    Emotionally abused: Not on file    Physically abused: Not on file    Forced sexual activity: Not on file  Other Topics Concern  . Not on file  Social History Narrative  . Not on file     FAMILY HISTORY:   Family Status  Relation Name Status  . Mother  Deceased  . Father  Deceased  . Sister  Alive  . Brother  Deceased  . Daughter x2 Alive  . Neg Hx  (Not Specified)    ROS: Review of Systems  Constitutional: Negative.   HENT:  Negative.   Eyes: Negative.   Respiratory: Negative.   Cardiovascular: Negative.   Gastrointestinal: Positive for heartburn and nausea.  Musculoskeletal: Positive for back pain.  Skin: Negative.   Endo/Heme/Allergies: Negative.   Psychiatric/Behavioral: Negative.      PHYSICAL EXAMINATION:    VITALS:   Vitals:   03/07/18 1410  BP: 140/76  Pulse: 64  SpO2: 96%  Weight: 154 lb (69.9 kg)  Height: 5' 9.5" (1.765 m)    GEN:  The patient appears stated age and is in NAD. HEENT:  Normocephalic, atraumatic.  The mucous membranes are moist. The superficial temporal arteries are without ropiness or tenderness. CV: RRR Lungs:  CTAB Neck/HEME:  There are no carotid bruits bilaterally.  Neurological examination:  Orientation: The patient is alert and oriented x3.  Cranial nerves: There is good facial symmetry.  There is mild facial hypomimia.  Pupils are equal round and reactive to light bilaterally. Fundoscopic exam reveals clear margins bilaterally. Extraocular muscles are intact. There are square wave jerks.  The visual fields are full to confrontational testing. The speech is fluent and clear.   He is hypophonic and very pseudobulbar (noted in past as well).  The patient is able to make the gutteral sounds without difficulty.  Soft palate rises symmetrically and there is no tongue deviation. Hearing is intact to conversational tone. Sensation: Sensation is intact to light touch throughout Motor: Strength is 5/5 in the bilateral upper and lower extremities.   Shoulder shrug is equal and symmetric.  There is no pronator drift.  No fasciculations, including in the tongue   Movement examination: Tone: There is normal tone in the  bilateral upper extremities.  The tone in the lower extremities is normal.  Abnormal movements: There is independent R and LUE resting tremor that increases with distraction.  No leg tremor today Coordination:  There is decremation with any form of RAMS, including alternating supination and pronation of the forearm, hand opening and closing, finger taps, heel taps and toe taps, R more than left Gait and Station: The patient is able to arise without the use of the hands.  Walks well down the hall.  Negative pull test.    Lab Results  Component Value Date   TSH 0.80 12/20/2016   Lab Results  Component Value Date   [id number] 627 12/20/2016   Patient did bring lab work to me dated June 12, 2017.  Sodium was 141, potassium 4.1, chloride 105, CO2 28, BUN 20 and creatinine 1.0.  AST is 20, ALT 17.   ASSESSMENT/PLAN:  1.  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  His speech is very pseudobulbar.  He has carbidopa/levodopa 25/100 at home but wasn't ready to take it until today.  He would like to start.  Told him may need to push dose up if no SE  -states that daughter told them to take B12.  His B12 is normal and there is no reason for taking it.    -refuses EMG test.  Discussed that motor neuron disease is still in the differential diagnosis, even though he has no weakness.  Discussed this again today and told to let me know if changes his mind.  No fasciculations noted  -talked about relationship to melanoma.  He just had an appt with dermatology.  -wife asked me multiple questions and answered to best of my ability.  I asked if he  could stop his reflux medication and Zofran if he started levodopa.  I told her that he should continue to take medications from his other doctors, as these are independent issues.  2.  Thrombocytopenia  -Being monitored by primary care.  3.  F/u 4  months.  Much greater than 50% of this visit was spent in counseling and coordinating care.  Total face to face time:  25 min   Cc:  Reynold Bowen, MD

## 2018-03-07 ENCOUNTER — Encounter: Payer: Self-pay | Admitting: Neurology

## 2018-03-07 ENCOUNTER — Ambulatory Visit (INDEPENDENT_AMBULATORY_CARE_PROVIDER_SITE_OTHER): Payer: Medicare Other | Admitting: Neurology

## 2018-03-07 VITALS — BP 140/76 | HR 64 | Ht 69.5 in | Wt 154.0 lb

## 2018-03-07 DIAGNOSIS — G20 Parkinson's disease: Secondary | ICD-10-CM | POA: Diagnosis not present

## 2018-03-07 DIAGNOSIS — R110 Nausea: Secondary | ICD-10-CM | POA: Diagnosis not present

## 2018-03-07 NOTE — Patient Instructions (Signed)
Start Carbidopa Levodopa as follows:  Take 1/2 tablet three times daily, at least 30 minutes before meals, for one week  Then take 1/2 tablet in the morning, 1/2 tablet in the afternoon, 1 tablet in the evening, at least 30 minutes before meals, for one week  Then take 1/2 tablet in the morning, 1 tablet in the afternoon, 1 tablet in the evening, at least 30 minutes before meals, for one week  Then take 1 tablet three times daily, at least 30 minutes before meals   As a reminder, carbidopa/levodopa can be taken at the same time as a carbohydrate, but we like to have you take your pill either 30 minutes before a protein source or 1 hour after as protein can interfere with carbidopa/levodopa absorption.  

## 2018-03-20 DIAGNOSIS — Z23 Encounter for immunization: Secondary | ICD-10-CM | POA: Diagnosis not present

## 2018-04-07 DIAGNOSIS — H2513 Age-related nuclear cataract, bilateral: Secondary | ICD-10-CM | POA: Diagnosis not present

## 2018-04-07 DIAGNOSIS — H01022 Squamous blepharitis right lower eyelid: Secondary | ICD-10-CM | POA: Diagnosis not present

## 2018-04-07 DIAGNOSIS — H01025 Squamous blepharitis left lower eyelid: Secondary | ICD-10-CM | POA: Diagnosis not present

## 2018-04-07 DIAGNOSIS — H01024 Squamous blepharitis left upper eyelid: Secondary | ICD-10-CM | POA: Diagnosis not present

## 2018-04-07 DIAGNOSIS — H0289 Other specified disorders of eyelid: Secondary | ICD-10-CM | POA: Diagnosis not present

## 2018-04-07 DIAGNOSIS — H11823 Conjunctivochalasis, bilateral: Secondary | ICD-10-CM | POA: Diagnosis not present

## 2018-04-07 DIAGNOSIS — H01021 Squamous blepharitis right upper eyelid: Secondary | ICD-10-CM | POA: Diagnosis not present

## 2018-04-17 DIAGNOSIS — R7309 Other abnormal glucose: Secondary | ICD-10-CM | POA: Diagnosis not present

## 2018-04-17 DIAGNOSIS — G20 Parkinson's disease: Secondary | ICD-10-CM | POA: Diagnosis not present

## 2018-04-17 DIAGNOSIS — I10 Essential (primary) hypertension: Secondary | ICD-10-CM | POA: Diagnosis not present

## 2018-04-17 DIAGNOSIS — E7849 Other hyperlipidemia: Secondary | ICD-10-CM | POA: Diagnosis not present

## 2018-04-17 DIAGNOSIS — D696 Thrombocytopenia, unspecified: Secondary | ICD-10-CM | POA: Diagnosis not present

## 2018-04-17 DIAGNOSIS — R350 Frequency of micturition: Secondary | ICD-10-CM | POA: Diagnosis not present

## 2018-04-17 DIAGNOSIS — N401 Enlarged prostate with lower urinary tract symptoms: Secondary | ICD-10-CM | POA: Diagnosis not present

## 2018-04-17 DIAGNOSIS — Z6824 Body mass index (BMI) 24.0-24.9, adult: Secondary | ICD-10-CM | POA: Diagnosis not present

## 2018-04-17 DIAGNOSIS — R110 Nausea: Secondary | ICD-10-CM | POA: Diagnosis not present

## 2018-04-17 DIAGNOSIS — Z1389 Encounter for screening for other disorder: Secondary | ICD-10-CM | POA: Diagnosis not present

## 2018-05-23 ENCOUNTER — Telehealth: Payer: Self-pay | Admitting: Neurology

## 2018-05-23 NOTE — Telephone Encounter (Signed)
Patient wife would like to talk to someone about moving the RX for the  Carbadopa levodopa to [facility]

## 2018-05-26 MED ORDER — CARBIDOPA-LEVODOPA 25-100 MG PO TABS: 1.0000 | ORAL_TABLET | Freq: Three times a day (TID) | ORAL | 0 refills | Status: DC

## 2018-05-26 NOTE — Telephone Encounter (Signed)
Patient's wife made aware.

## 2018-05-26 NOTE — Telephone Encounter (Signed)
Carbidopa Levodopa sent to [facility].

## 2018-06-24 ENCOUNTER — Other Ambulatory Visit: Payer: Self-pay | Admitting: Neurology

## 2018-07-30 NOTE — Progress Notes (Signed)
Michael Odonnell was seen today in the movement disorders clinic for neurologic consultation at the request of Reynold Bowen, MD.  This patient is accompanied in the office by his spouse who supplements the history.  Pt just moved from [facility].   The consultation is for the evaluation of rest tremor x 1 year.  He was seen by his primary care physician in [place] who started him on primidone for a diagnosis of "essential tremor."  This caused extreme loss of balance and the medication was discontinued.  09/25/17 update: Patient seen today in follow-up, accompanied by his wife who supplements the history.  It has been about 9 months since I last saw the patient.  MRI of the brain was ordered since our last visit, but the patient did not proceed with this.  He cancelled his appointment.  States that he is doing well. Did observe RSB class since last visit.   Records have been reviewed since last visit.  He saw gastroenterology in November.  No changes were made to his medication regimen.  He has an appt on 5/21 with dermatology for blepharitis and chronic inflammation.  Denies any swallowing issues but states "voice isn't real good."  He notes hypophonia.  No falls.  No diplopia  10/31/17 update: Patient is seen today, accompanied by his wife who supplements the history.  I just saw the patient last month.  I started him on carbidopa/levodopa 25/100 and he was to work to 1 tablet 3 times per day.  His wife called 10/29/17 stating that the patient was nauseated for 1 week and she wondered if it was related to Parkinson's disease.  It turns out he never even started the carbidopa/levodopa 25/100.  He was advised to follow-up with the primary care first given that the nausea wasn't felt from PD.  It turns out that he was placed on doxycycline and that is what caused the nausea.  He is off of that now and the nausea went away.  He has more tremor.   He has some dizziness if stands up too quickly.  Wife asks me about  helping him limit his med list.    03/07/18 update: Patient is seen in follow-up for atypical parkinsonism, accompanied by his wife who supplements the history.  He is on no medication, which is by their choice.  No falls.  He does some home PT exercises.  No hallucinations or visual distortions.  Records are reviewed since our last visit.  He has been to GI multiple times with nausea, but the last time he was there in August, he reported that nausea was much better.  He states that he still has it some.    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  Patient was started on carbidopa/levodopa 25/100, 1 tablet 3 times per day last visit.  Reports that his swallowing has good.  He is not choking.  Not exercising because he has had a cold but he is getting over it.  Has had no falls.  No lightheadedness or near syncope.  No hallucinations.  No visual distortions.  PREVIOUS MEDICATIONS: Primidone (loss of balance)  ALLERGIES:   Allergies  Allergen Reactions  . Codeine Nausea And Vomiting  . Doxycycline Nausea Only  . Metronidazole Nausea Only    CURRENT MEDICATIONS:  Outpatient Encounter Medications as of 08/01/2018  Medication Sig  . acetaminophen (TYLENOL) 500 MG tablet Take 1,000 mg by mouth  every 6 (six) hours as needed.  Marland Kitchen amLODipine (NORVASC) 5 MG tablet Take 5 mg by mouth daily.  . Calcium Carbonate Antacid (TUMS PO) Take by mouth.  . carbidopa-levodopa (SINEMET IR) 25-100 MG tablet Take 1 tablet by mouth 3 (three) times daily.  . magnesium 30 MG tablet Take 30 mg by mouth daily.  . metoprolol succinate (TOPROL-XL) 50 MG 24 hr tablet Take 50 mg by mouth daily. Take with or immediately following a meal.  . Multiple Vitamins-Minerals (CENTRUM SILVER 50+MEN) TABS Take by mouth daily.  . niacin 500 MG tablet Take 500 mg by mouth 2 (two) times daily.  . perindopril (ACEON) 8 MG tablet Take 8 mg by mouth daily.    . polyethylene glycol (MIRALAX / GLYCOLAX) packet Take 17 g by mouth daily as needed.  . tamsulosin (FLOMAX) 0.4 MG CAPS capsule Take 1 capsule (0.4 mg total) by mouth daily.  . [DISCONTINUED] carbidopa-levodopa (SINEMET IR) 25-100 MG tablet TAKE 1 TABLET BY MOUTH 3  TIMES DAILY  . [DISCONTINUED] omeprazole (PRILOSEC) 20 MG capsule Take 1 capsule (20 mg total) by mouth daily.  . [DISCONTINUED] ondansetron (ZOFRAN) 4 MG tablet Take 1 tablet (4 mg total) by mouth 3 (three) times daily. 1 tablet every 6-8 hours as needed   No facility-administered encounter medications on file as of 08/01/2018.     PAST MEDICAL HISTORY:   Past Medical History:  Diagnosis Date  . Arthritis   . Colon polyps   . Degenerative lumbar disc   . Diverticulosis   . GERD (gastroesophageal reflux disease)   . Hypertension   . Sciatica   . Tremor     PAST SURGICAL HISTORY:   Past Surgical History:  Procedure Laterality Date  . CLEFT LIP REPAIR    . CLEFT PALATE REPAIR      SOCIAL HISTORY:   Social History   Socioeconomic History  . Marital status: Married    Spouse name: Not on file  . Number of children: 2  . Years of education: Not on file  . Highest education level: Not on file  Occupational History  . 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 Financial resource strain: Not on file  . Food insecurity:    Worry: Not on file    Inability: Not on file  . Transportation needs:    Medical: Not on file    Non-medical: Not on file  Tobacco Use  . Smoking status: Former Smoker    Types: Cigarettes    Last attempt to quit: 12/20/2012    Years since quitting: 5.6  . Smokeless tobacco: Never Used  Substance and Sexual Activity  . Alcohol use: No  . Drug use: No  . Sexual activity: Not Currently  Lifestyle  . Physical activity:    Days per week: Not on file    Minutes per session: Not on file  . Stress: Not on file  Relationships  .  Social connections:    Talks on phone: Not on file    Gets together: Not on file    Attends religious service: Not on file    Active member of club or organization: Not on file    Attends meetings of clubs or organizations: Not on file    Relationship status: Not on file  . Intimate partner violence:    Fear of current or ex partner: Not on file    Emotionally abused: Not on  file    Physically abused: Not on file    Forced sexual activity: Not on file  Other Topics Concern  . Not on file  Social History Narrative  . Not on file    FAMILY HISTORY:   Family Status  Relation Name Status  . Mother  Deceased  . Father  Deceased  . Sister  Alive  . Brother  Deceased  . Daughter x2 Alive  . Neg Hx  (Not Specified)    ROS: Review of Systems  Constitutional: Negative.   HENT: Negative.   Eyes: Negative.   Cardiovascular: Negative.   Gastrointestinal: Negative.   Genitourinary: Negative.   Skin: Negative.      PHYSICAL EXAMINATION:    VITALS:   Vitals:   08/01/18 1536  BP: (!) 146/84  Pulse: 64  SpO2: 95%  Weight: 170 lb (77.1 kg)  Height: 5\' 9"  (1.753 m)    GEN:  The patient appears stated age and is in NAD. HEENT:  Normocephalic, atraumatic.  The mucous membranes are moist. The superficial temporal arteries are without ropiness or tenderness. CV: RRR Lungs:  CTAB Neck/HEME:  There are no carotid bruits bilaterally.  Neurological examination:  Orientation: The patient is alert and oriented x3.  Cranial nerves: There is good facial symmetry.  There is mild facial hypomimia. Extraocular muscles are intact. There are square wave jerks.  The visual fields are full to confrontational testing. The speech is fluent and clear.   He is hypophonic and very pseudobulbar (noted in past as well).  The patient is able to make the gutteral sounds without difficulty.  Soft palate rises symmetrically and there is no tongue deviation. Hearing is intact to conversational  tone. Sensation: Sensation is intact to light touch throughout Motor: Strength is 5/5 in the bilateral upper and lower extremities.   Shoulder shrug is equal and symmetric.  There is no pronator drift.  No fasciculations, including in the tongue.  He does have tongue tremor    Movement examination: Tone: There is normal tone in the bilateral upper extremities.  The tone in the lower extremities is normal.  Abnormal movements: There is independent R and LUE resting tremor that increases with distraction.  No leg tremor today.  There is tongue tremor Coordination:  There is decremation with any form of RAMS, including alternating supination and pronation of the forearm, hand opening and closing, finger taps, heel taps and toe taps, R more than left Gait and Station: The patient is able to arise without the use of the hands.  Walks well down the hall.  Negative pull test.    Lab Results  Component Value Date   TSH 0.80 12/20/2016   Lab Results  Component Value Date   [id number] 503 12/20/2016   Patient did bring lab work to me dated June 12, 2017.  Sodium was 141, potassium 4.1, chloride 105, CO2 28, BUN 20 and creatinine 1.0.  AST is 20, ALT 17.   ASSESSMENT/PLAN:  1.  parkinsonism  -have considered an atypical state given the very pseudobulbar speech and signficant tongue tremor but haven't seen significant deterioration and patient/wife report signficant help with carbidopa/levodopa 25/100.  Will continue to monitor.  Will continue carbidopa/levodopa 25/100 tid.    -refuses EMG test.  Discussed that motor neuron disease is still in the differential diagnosis, even though he has no weakness.  Discussed this again today and told to let me know if changes his mind.  No fasciculations noted  -talked about  relationship to melanoma.  He follows with dermatology  2.  Thrombocytopenia  -Being monitored by primary care.  3.  Follow up is anticipated in the next few months, sooner should new  neurologic issues arise.  Much greater than 50% of this visit was spent in counseling and coordinating care.  Total face to face time:  25 min   Cc:  Reynold Bowen, MD

## 2018-08-01 ENCOUNTER — Encounter: Payer: Self-pay | Admitting: Neurology

## 2018-08-01 ENCOUNTER — Ambulatory Visit (INDEPENDENT_AMBULATORY_CARE_PROVIDER_SITE_OTHER): Payer: Medicare Other | Admitting: Neurology

## 2018-08-01 VITALS — BP 146/84 | HR 64 | Ht 69.0 in | Wt 170.0 lb

## 2018-08-01 DIAGNOSIS — G20 Parkinson's disease: Secondary | ICD-10-CM

## 2018-08-01 MED ORDER — CARBIDOPA-LEVODOPA 25-100 MG PO TABS: 1.0000 | ORAL_TABLET | Freq: Three times a day (TID) | ORAL | 2 refills | Status: DC

## 2018-08-01 NOTE — Patient Instructions (Signed)
The physicians and staff at [facility] are committed to providing excellent care. You may receive a survey requesting feedback about your experience at our office. We strive to receive "very good" responses to the survey questions. If you feel that your experience would prevent you from giving the office a "very good " response, please contact our office to try to remedy the situation. We may be reached at [phone]. Thank you for taking the time out of your busy day to complete the survey.  

## 2018-10-24 DIAGNOSIS — R7309 Other abnormal glucose: Secondary | ICD-10-CM | POA: Diagnosis not present

## 2018-10-24 DIAGNOSIS — Z125 Encounter for screening for malignant neoplasm of prostate: Secondary | ICD-10-CM | POA: Diagnosis not present

## 2018-10-24 DIAGNOSIS — I10 Essential (primary) hypertension: Secondary | ICD-10-CM | POA: Diagnosis not present

## 2018-10-27 DIAGNOSIS — I10 Essential (primary) hypertension: Secondary | ICD-10-CM | POA: Diagnosis not present

## 2018-10-27 DIAGNOSIS — R82998 Other abnormal findings in urine: Secondary | ICD-10-CM | POA: Diagnosis not present

## 2018-10-31 DIAGNOSIS — R110 Nausea: Secondary | ICD-10-CM | POA: Diagnosis not present

## 2018-10-31 DIAGNOSIS — N401 Enlarged prostate with lower urinary tract symptoms: Secondary | ICD-10-CM | POA: Diagnosis not present

## 2018-10-31 DIAGNOSIS — R7309 Other abnormal glucose: Secondary | ICD-10-CM | POA: Diagnosis not present

## 2018-10-31 DIAGNOSIS — Z0000 Encounter for general adult medical examination without abnormal findings: Secondary | ICD-10-CM | POA: Diagnosis not present

## 2018-10-31 DIAGNOSIS — E785 Hyperlipidemia, unspecified: Secondary | ICD-10-CM | POA: Diagnosis not present

## 2018-10-31 DIAGNOSIS — J449 Chronic obstructive pulmonary disease, unspecified: Secondary | ICD-10-CM | POA: Diagnosis not present

## 2018-10-31 DIAGNOSIS — K635 Polyp of colon: Secondary | ICD-10-CM | POA: Diagnosis not present

## 2018-10-31 DIAGNOSIS — M545 Low back pain: Secondary | ICD-10-CM | POA: Diagnosis not present

## 2018-10-31 DIAGNOSIS — D696 Thrombocytopenia, unspecified: Secondary | ICD-10-CM | POA: Diagnosis not present

## 2018-10-31 DIAGNOSIS — I10 Essential (primary) hypertension: Secondary | ICD-10-CM | POA: Diagnosis not present

## 2018-10-31 DIAGNOSIS — R251 Tremor, unspecified: Secondary | ICD-10-CM | POA: Diagnosis not present

## 2018-10-31 DIAGNOSIS — G20 Parkinson's disease: Secondary | ICD-10-CM | POA: Diagnosis not present

## 2018-12-05 DIAGNOSIS — L821 Other seborrheic keratosis: Secondary | ICD-10-CM | POA: Diagnosis not present

## 2018-12-05 DIAGNOSIS — D485 Neoplasm of uncertain behavior of skin: Secondary | ICD-10-CM | POA: Diagnosis not present

## 2018-12-05 DIAGNOSIS — D692 Other nonthrombocytopenic purpura: Secondary | ICD-10-CM | POA: Diagnosis not present

## 2018-12-05 DIAGNOSIS — L570 Actinic keratosis: Secondary | ICD-10-CM | POA: Diagnosis not present

## 2018-12-05 DIAGNOSIS — D225 Melanocytic nevi of trunk: Secondary | ICD-10-CM | POA: Diagnosis not present

## 2018-12-05 DIAGNOSIS — D235 Other benign neoplasm of skin of trunk: Secondary | ICD-10-CM | POA: Diagnosis not present

## 2018-12-05 DIAGNOSIS — D1801 Hemangioma of skin and subcutaneous tissue: Secondary | ICD-10-CM | POA: Diagnosis not present

## 2018-12-05 DIAGNOSIS — C44729 Squamous cell carcinoma of skin of left lower limb, including hip: Secondary | ICD-10-CM | POA: Diagnosis not present

## 2018-12-05 DIAGNOSIS — Z85828 Personal history of other malignant neoplasm of skin: Secondary | ICD-10-CM | POA: Diagnosis not present

## 2018-12-10 HISTORY — PX: SKIN GRAFT FULL THICKNESS LEG: SUR1299

## 2018-12-17 DIAGNOSIS — C44729 Squamous cell carcinoma of skin of left lower limb, including hip: Secondary | ICD-10-CM | POA: Diagnosis not present

## 2018-12-19 IMAGING — US US ABDOMEN COMPLETE
1 series · 14 of 25 positions shown · non-contrast
Comparison: None.

CLINICAL DATA: Nausea for 6 months.

EXAM:
ABDOMEN ULTRASOUND COMPLETE

[Series 1: us abdomen complete · 14 of 106 slices shown]
[im 1/106]
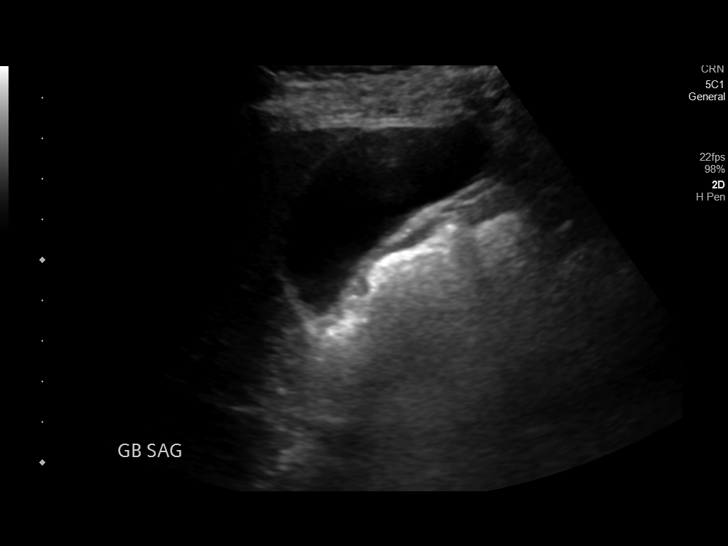
[im 9/106]
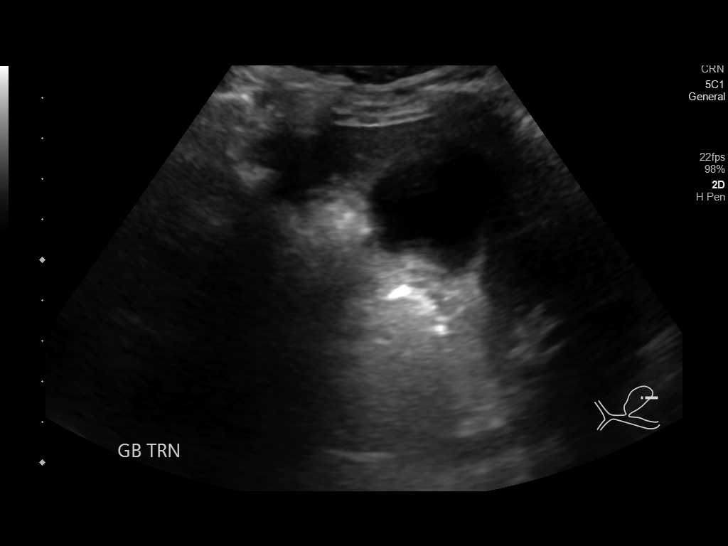
[im 18/106]
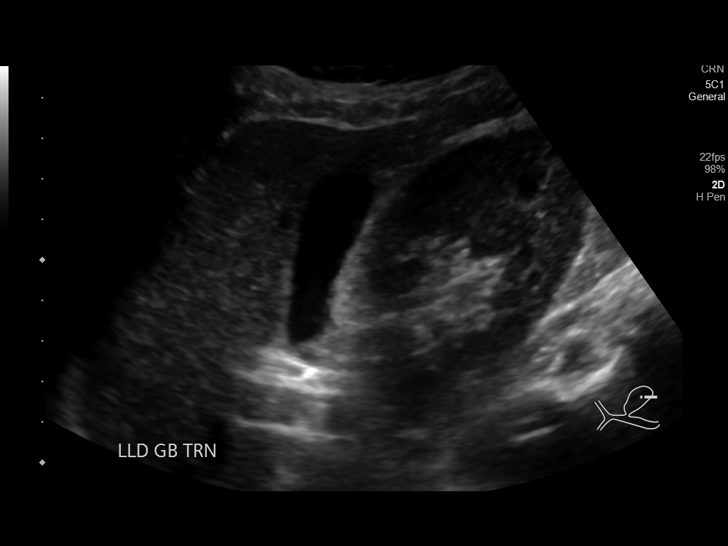
[im 27/106]
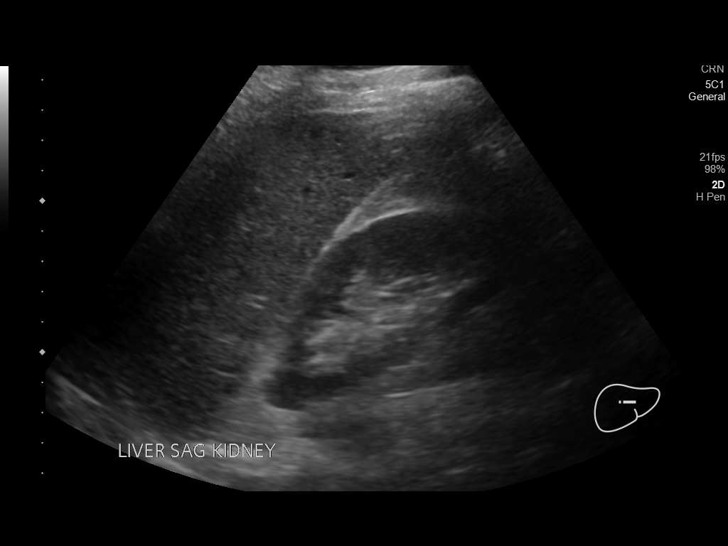
[im 36/106]
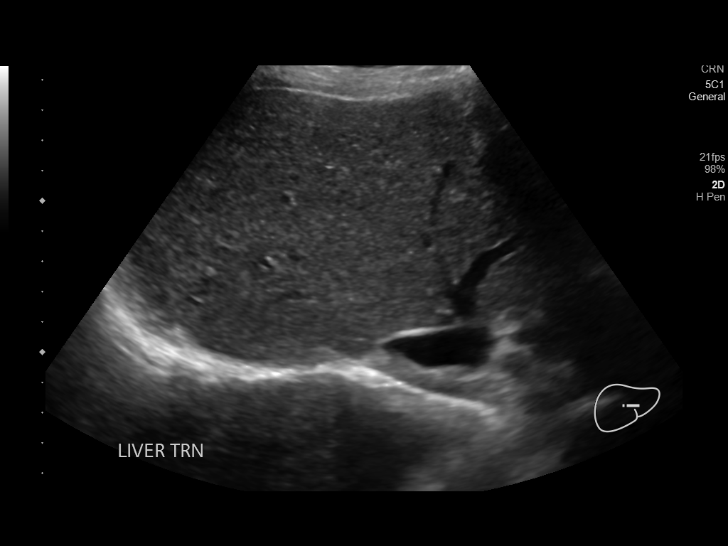
[im 40/106]
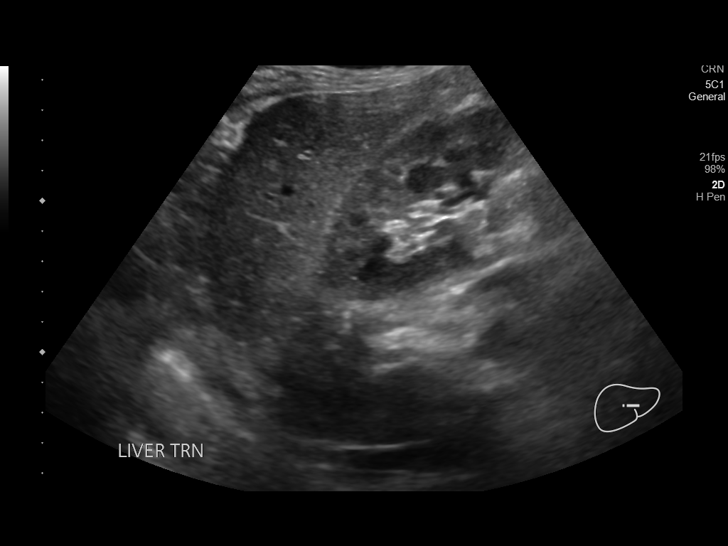
[im 49/106]
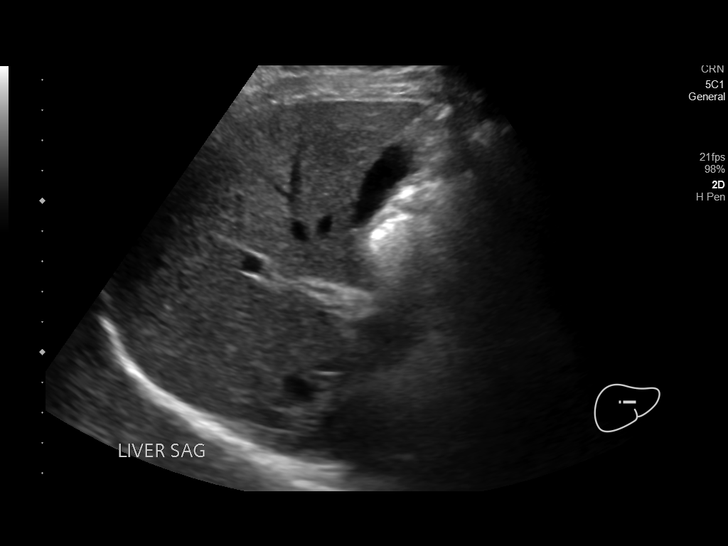
[im 57/106]
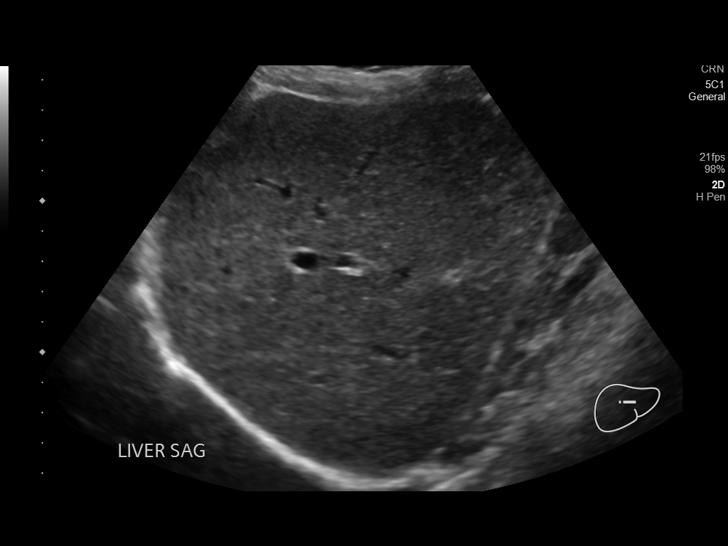
[im 66/106]
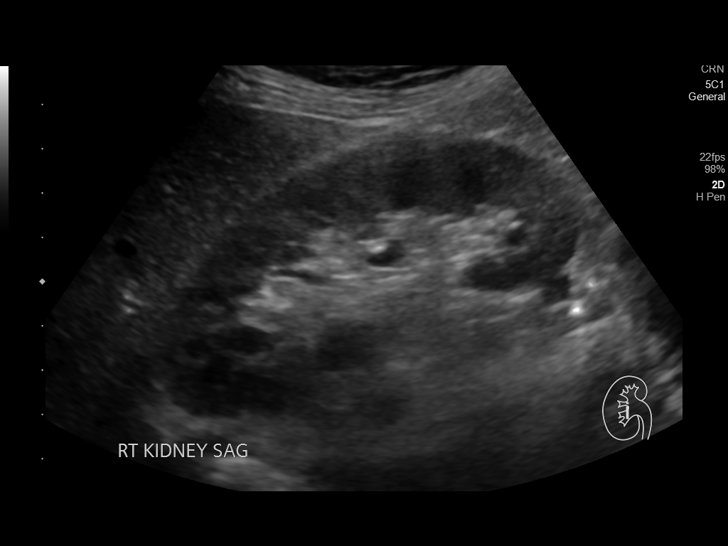
[im 71/106]
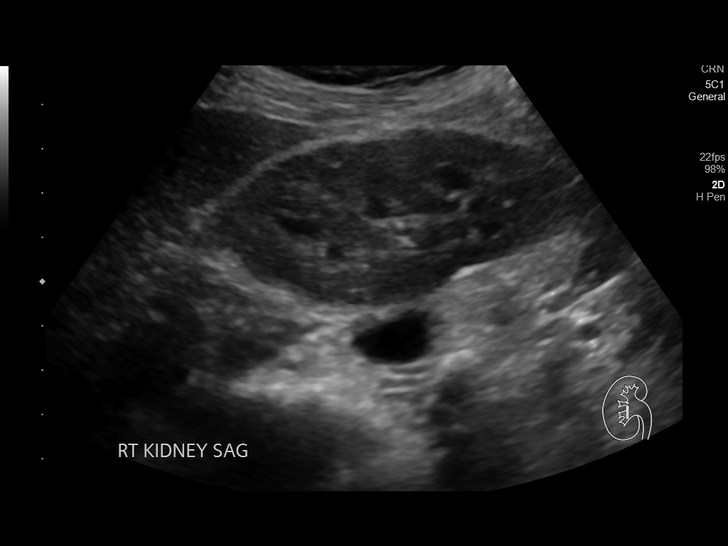
[im 79/106]
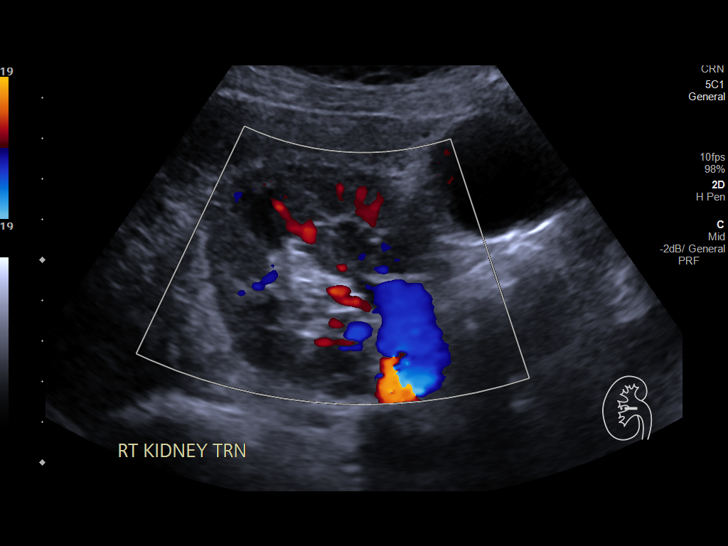
[im 88/106]
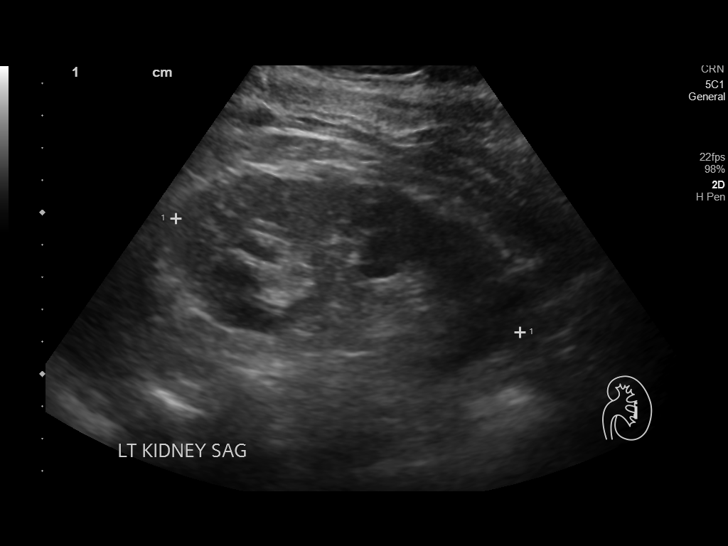
[im 97/106]
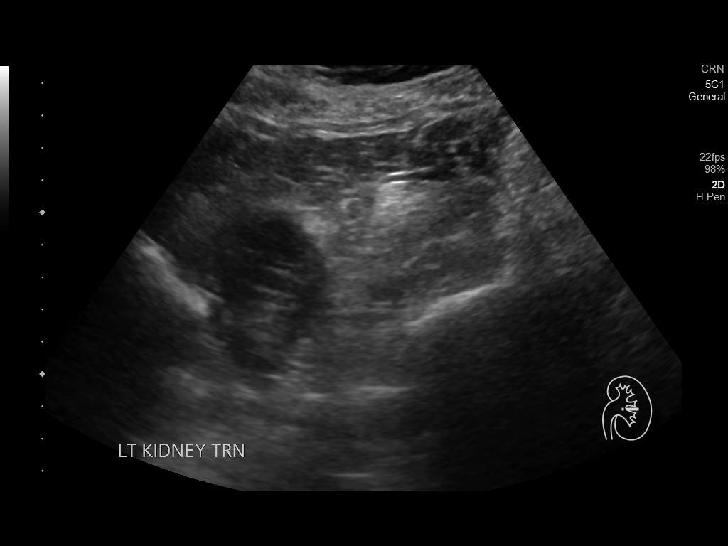
[im 106/106]
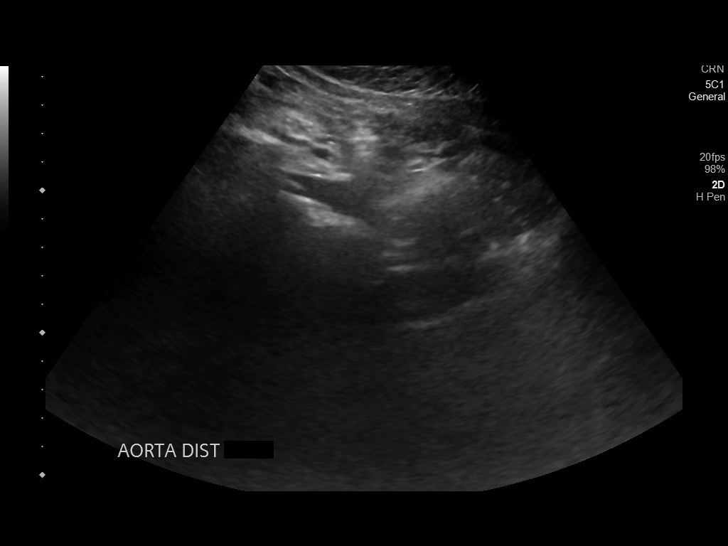

[14 of 25 positions shown; findings below may reference images not displayed]

FINDINGS: Gallbladder: No gallstones or wall thickening visualized. No
sonographic Murphy sign noted by sonographer.

Common bile duct: Diameter: 0.2 cm

Liver: No focal lesion identified. Within normal limits in
parenchymal echogenicity. Portal vein is patent on color Doppler
imaging with normal direction of blood flow towards the liver.

IVC: No abnormality visualized.

Pancreas: Visualized portion unremarkable.

Spleen: Size and appearance within normal limits.

Right Kidney: Length: 10.3 cm. Echogenicity within normal limits. 1
cm cyst noted. No solid mass or hydronephrosis visualized.

Left Kidney: Length: 11.2 cm. Echogenicity within normal limits. No
mass or hydronephrosis visualized.

Abdominal aorta: No aneurysm visualized.

Other findings: None.
IMPRESSION: Negative examination.

## 2018-12-22 NOTE — Progress Notes (Signed)
Virtual Visit via Video Note The purpose of this virtual visit is to provide medical care while limiting exposure to the novel coronavirus.    Consent was obtained for video visit:  Yes.   Answered questions that patient had about telehealth interaction:  Yes.   I discussed the limitations, risks, security and privacy concerns of performing an evaluation and management service by telemedicine. I also discussed with the patient that there may be a patient responsible charge related to this service. The patient expressed understanding and agreed to proceed.  Pt location: Home Physician Location: office Name of referring provider:  Reynold Bowen, MD I connected with Michael Odonnell at patients initiation/request on 12/25/2018 at 11:15 AM EDT by video enabled telemedicine application and verified that I am speaking with the correct person using two identifiers. 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  "I'm doing pretty good for an old man."  No falls.  Patient is on carbidopa/levodopa 25/100, 1 tablet 3 times per day.  No falls since last visit.  No lightheadedness or near syncope.  Denies swallow trouble.  Denies double vision.  Walking and doing sit ups/core for exercises  Past Medical History:  Diagnosis Date  . Arthritis   . Colon polyps   . Degenerative lumbar disc   . Diverticulosis   . GERD (gastroesophageal reflux disease)   . Hypertension   . Sciatica   . 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 Tremor    Review of Systems  Constitutional: Negative.   Eyes: Negative.   Respiratory: Negative.   Cardiovascular: Negative.   Gastrointestinal: Negative.   Skin: Negative.      Current Outpatient Medications on File Prior to Visit  Medication Sig Dispense Refill   . acetaminophen (TYLENOL) 500 MG tablet Take 1,000 mg by mouth every 6 (six) hours as needed.    Marland Kitchen amLODipine (NORVASC) 5 MG tablet Take 5 mg by mouth daily.    . Calcium Carbonate Antacid (TUMS PO) Take by mouth.    . carbidopa-levodopa (SINEMET IR) 25-100 MG tablet Take 1 tablet by mouth 3 (three) times daily. 270 tablet 2  . Carboxymethylcellulose Sodium 1 % GEL Place 1 packet into both eyes 3 (three) times a day as needed.    . cefdinir (OMNICEF) 300 MG capsule TK ONE C PO  BID    . fluorouracil (EFUDEX) 5 % cream APPLY TO FACE BID FOR 2 WEEKS AND APPLY TO FOREARMS BID FOR 4 WEEKS    . magnesium 30 MG tablet Take 500 mg by mouth daily.     . metoprolol succinate (TOPROL-XL) 50 MG 24 hr tablet Take 50 mg by mouth daily. Take with or immediately following a meal.    . Multiple Vitamins-Minerals (CENTRUM SILVER 50+MEN) TABS Take by mouth daily.    . niacin 500 MG tablet Take 500 mg by mouth 2 (two) times daily.    . perindopril (ACEON) 8 MG tablet Take 8 mg by mouth daily.    . polyethylene glycol (MIRALAX / GLYCOLAX) packet Take 17 g by mouth daily as needed.    . tamsulosin (FLOMAX) 0.4 MG CAPS capsule Take 1 capsule (0.4 mg total) by mouth daily. 30 capsule 0   No current facility-administered medications on file prior to visit.  Observations/Objective:   Vitals:   12/25/18 0854  Weight: 172 lb (78 kg)  Height: 5\' 9"  (1.753 m)   GEN:  The patient appears stated age and is in NAD.  Neurological examination:  Orientation: The patient is alert and oriented x3. Cranial nerves: There is good facial symmetry. There is facial hypomimia.  Lips are parted.  The speech is fluent and very pseudobulbar. Soft palate rises symmetrically and there is no tongue deviation. Hearing is intact to conversational tone. Motor: Strength is at least antigravity x 4.   Shoulder shrug is equal and symmetric.  There is no pronator drift.  Movement examination: Tone: unable Abnormal movements: There  is bilateral upper extremity resting tremor.  There is jaw tremor. Coordination:  There is mild decremation with RAM's, with hand opening and closing, finger taps and toe taps Gait and Station: The patient pushes off of the chair to arise.  He walks very well.  He has bilateral upper extremity resting tremor with ambulation.     Assessment and Plan:   1.  Parkinsonism  -Have considered an atypical state for a long time given the very pseudobulbar speech and significant tongue tremor, but I have not really seen significant deterioration and both patient and wife report significant help with low-dose levodopa.  I will continue him on carbidopa/levodopa 25/100, 1 tablet 3 times per day.  Asked him if he thought he needed an increase, and he declined.  -Patient refuses EMG.  Understands that motor neuron disease is in the differential.  -Discussed various online exercise programs, including those through [employer] as well as rock steady boxing.  -will get labs via PCP  2.  Thrombocytopenia  -Being managed/monitored by primary care.  Follow Up Instructions:  4-6 months  -I discussed the assessment and treatment plan with the patient. The patient was provided an opportunity to ask questions and all were answered. The patient agreed with the plan and demonstrated an understanding of the instructions.   The patient was advised to call back or seek an in-person evaluation if the symptoms worsen or if the condition fails to improve as anticipated.     Alonza Bogus, DO

## 2018-12-25 ENCOUNTER — Telehealth (INDEPENDENT_AMBULATORY_CARE_PROVIDER_SITE_OTHER): Payer: Medicare Other | Admitting: Neurology

## 2018-12-25 ENCOUNTER — Encounter: Payer: Self-pay | Admitting: Neurology

## 2018-12-25 ENCOUNTER — Other Ambulatory Visit: Payer: Self-pay

## 2018-12-25 VITALS — Ht 69.0 in | Wt 172.0 lb

## 2018-12-25 DIAGNOSIS — G20 Parkinson's disease: Secondary | ICD-10-CM | POA: Diagnosis not present

## 2019-03-17 DIAGNOSIS — Z23 Encounter for immunization: Secondary | ICD-10-CM | POA: Diagnosis not present

## 2019-03-27 ENCOUNTER — Other Ambulatory Visit: Payer: Self-pay | Admitting: Neurology

## 2019-03-27 NOTE — Telephone Encounter (Signed)
Requested Prescriptions   Pending Prescriptions Disp Refills  . carbidopa-levodopa (SINEMET IR) 25-100 MG tablet [Pharmacy Med Name: CARB LEVODOPA  25MG  100MG   TAB] 270 tablet 3    Sig: TAKE 1 TABLET BY MOUTH 3  TIMES DAILY   Rx last filled:08/01/18 #270 2 refills  Pt last seen:12/25/18  Follow up appt scheduled:05/19/19

## 2019-04-08 DIAGNOSIS — H0102B Squamous blepharitis left eye, upper and lower eyelids: Secondary | ICD-10-CM | POA: Diagnosis not present

## 2019-04-08 DIAGNOSIS — H2513 Age-related nuclear cataract, bilateral: Secondary | ICD-10-CM | POA: Diagnosis not present

## 2019-04-08 DIAGNOSIS — H0102A Squamous blepharitis right eye, upper and lower eyelids: Secondary | ICD-10-CM | POA: Diagnosis not present

## 2019-04-08 DIAGNOSIS — H0220C Unspecified lagophthalmos, bilateral, upper and lower eyelids: Secondary | ICD-10-CM | POA: Diagnosis not present

## 2019-04-08 DIAGNOSIS — H04123 Dry eye syndrome of bilateral lacrimal glands: Secondary | ICD-10-CM | POA: Diagnosis not present

## 2019-04-20 DIAGNOSIS — E785 Hyperlipidemia, unspecified: Secondary | ICD-10-CM | POA: Diagnosis not present

## 2019-04-20 DIAGNOSIS — J449 Chronic obstructive pulmonary disease, unspecified: Secondary | ICD-10-CM | POA: Diagnosis not present

## 2019-04-20 DIAGNOSIS — D696 Thrombocytopenia, unspecified: Secondary | ICD-10-CM | POA: Diagnosis not present

## 2019-04-20 DIAGNOSIS — G20 Parkinson's disease: Secondary | ICD-10-CM | POA: Diagnosis not present

## 2019-04-20 DIAGNOSIS — I10 Essential (primary) hypertension: Secondary | ICD-10-CM | POA: Diagnosis not present

## 2019-04-20 DIAGNOSIS — N401 Enlarged prostate with lower urinary tract symptoms: Secondary | ICD-10-CM | POA: Diagnosis not present

## 2019-04-20 DIAGNOSIS — R7309 Other abnormal glucose: Secondary | ICD-10-CM | POA: Diagnosis not present

## 2019-05-18 NOTE — Progress Notes (Signed)
Virtual Visit via Video Note The purpose of this virtual visit is to provide medical care while limiting exposure to the novel coronavirus.    Consent was obtained for video visit:  Yes.   Answered questions that patient had about telehealth interaction:  Yes.   I discussed the limitations, risks, security and privacy concerns of performing an evaluation and management service by telemedicine. I also discussed with the patient that there may be a patient responsible charge related to this service. The patient expressed understanding and agreed to proceed.  Pt location: Home Physician Location: office Name of referring provider:  Reynold Bowen, MD I connected with Michael Odonnell at patients initiation/request on 05/19/2019 at 11:15 AM EST by video enabled telemedicine application and verified that I am speaking with the correct person using two identifiers. 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  His wife accompanies him today.  Patient reports no significant change since I last saw him.  He is still on carbidopa/levodopa 25/100, 1 tablet 3 times per day.  Tremor is a little worse.  Is a little SOB.  Pt denies falls.  Pt denies lightheadedness, near syncope.  No hallucinations.  Mood has been good.  Had ophth appt and that was good except for dry eye.    Past Medical History:  Diagnosis Date  . Arthritis   . Colon polyps   . Degenerative lumbar disc   . Diverticulosis   . GERD (gastroesophageal reflux disease)   . Hypertension   . Sciatica   . 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 Tremor    Review of Systems  Constitutional: Negative.   Eyes: Negative.   Respiratory: Negative.   Cardiovascular: Negative.   Gastrointestinal: Negative.   Skin: Negative.      Current Outpatient Medications on File Prior to Visit   Medication Sig Dispense Refill  . acetaminophen (TYLENOL) 500 MG tablet Take 1,000 mg by mouth every 6 (six) hours as needed.    Marland Kitchen amLODipine (NORVASC) 5 MG tablet Take 5 mg by mouth daily.    . Calcium Carbonate Antacid (TUMS PO) Take by mouth.    . carbidopa-levodopa (SINEMET IR) 25-100 MG tablet TAKE 1 TABLET BY MOUTH 3  TIMES DAILY 270 tablet 2  . Carboxymethylcellulose Sodium 1 % GEL Place 1 packet into both eyes 3 (three) times a day as needed.    . cefdinir (OMNICEF) 300 MG capsule TK ONE C PO  BID    . fluorouracil (EFUDEX) 5 % cream APPLY TO FACE BID FOR 2 WEEKS AND APPLY TO FOREARMS BID FOR 4 WEEKS    . magnesium 30 MG tablet Take 500 mg by mouth daily.     . metoprolol succinate (TOPROL-XL) 50 MG 24 hr tablet Take 50 mg by mouth daily. Take with or immediately following a meal.    . Multiple Vitamins-Minerals (CENTRUM SILVER 50+MEN) TABS Take by mouth daily.    . niacin 500 MG tablet Take 500 mg by mouth 2 (two) times daily.    . perindopril (ACEON) 8 MG tablet Take 8 mg by mouth daily.    . polyethylene glycol (MIRALAX / GLYCOLAX) packet Take 17 g by mouth daily as needed.    . tamsulosin (FLOMAX) 0.4 MG CAPS capsule Take 1 capsule (0.4 mg total) by mouth daily. Elfrida  capsule 0   No current facility-administered medications on file prior to visit.      Observations/Objective:   There were no vitals filed for this visit. GEN:  The patient appears stated age and is in NAD.  Neurological examination:  Orientation: The patient is alert and oriented x3. Cranial nerves: There is good facial symmetry. There is facial hypomimia.  Lips are parted.  The speech is fluent and very pseudobulbar. Soft palate rises symmetrically and there is no tongue deviation. Hearing is intact to conversational tone. Motor: Strength is at least antigravity x 4.   Shoulder shrug is equal and symmetric.  There is no pronator drift.  Movement examination: Tone: unable Abnormal movements: There is bilateral  upper extremity resting tremor.  There is jaw tremor. Coordination:  There is mild decremation with RAM's, with hand opening and closing, finger taps and toe taps Gait and Station: The patient pushes off of the chair to arise.  He walks very well.  He has bilateral upper extremity resting tremor with ambulation.     Assessment and Plan:   1.  Parkinsonism  -Have considered an atypical state for a long time given the very pseudobulbar speech and significant tongue tremor, but I have not really seen significant deterioration and both patient and wife report significant help with low-dose levodopa.  I will continue him on carbidopa/levodopa 25/100, 1 tablet 3 times per day.  Noting little more tremor but pt doesn't think that he needs an increase.  Will let me know if changes mind before next visit  -Patient refuses EMG.  Understands that motor neuron disease is in the differential.  -Discussed various online exercise programs, including those through [employer] as well as rock steady boxing.  -next derm visit in a few months per wife  2.  Thrombocytopenia  -Being managed/monitored by primary care.  3.  SOB  -told to f/u with PCP  Follow Up Instructions: 4-6 months   -I discussed the assessment and treatment plan with the patient. The patient was provided an opportunity to ask questions and all were answered. The patient agreed with the plan and demonstrated an understanding of the instructions.   The patient was advised to call back or seek an in-person evaluation if the symptoms worsen or if the condition fails to improve as anticipated.     Alonza Bogus, DO

## 2019-05-19 ENCOUNTER — Telehealth (INDEPENDENT_AMBULATORY_CARE_PROVIDER_SITE_OTHER): Payer: Medicare Other | Admitting: Neurology

## 2019-05-19 ENCOUNTER — Other Ambulatory Visit: Payer: Self-pay

## 2019-05-19 DIAGNOSIS — R0602 Shortness of breath: Secondary | ICD-10-CM

## 2019-05-19 DIAGNOSIS — D696 Thrombocytopenia, unspecified: Secondary | ICD-10-CM

## 2019-05-19 DIAGNOSIS — G20 Parkinson's disease: Secondary | ICD-10-CM | POA: Diagnosis not present

## 2019-08-24 DIAGNOSIS — Z23 Encounter for immunization: Secondary | ICD-10-CM | POA: Diagnosis not present

## 2019-09-21 DIAGNOSIS — Z23 Encounter for immunization: Secondary | ICD-10-CM | POA: Diagnosis not present

## 2019-10-08 ENCOUNTER — Ambulatory Visit: Payer: Medicare Other | Admitting: Neurology

## 2019-10-18 DIAGNOSIS — R000 Tachycardia, unspecified: Secondary | ICD-10-CM | POA: Diagnosis not present

## 2019-10-18 DIAGNOSIS — R002 Palpitations: Secondary | ICD-10-CM | POA: Diagnosis not present

## 2019-10-18 DIAGNOSIS — I10 Essential (primary) hypertension: Secondary | ICD-10-CM | POA: Diagnosis not present

## 2019-10-18 DIAGNOSIS — I4891 Unspecified atrial fibrillation: Secondary | ICD-10-CM | POA: Diagnosis not present

## 2019-10-19 ENCOUNTER — Observation Stay (HOSPITAL_BASED_OUTPATIENT_CLINIC_OR_DEPARTMENT_OTHER): Payer: Medicare Other

## 2019-10-19 ENCOUNTER — Emergency Department (HOSPITAL_COMMUNITY): Payer: Medicare Other

## 2019-10-19 ENCOUNTER — Other Ambulatory Visit: Payer: Self-pay

## 2019-10-19 ENCOUNTER — Encounter (HOSPITAL_COMMUNITY): Payer: Self-pay | Admitting: Internal Medicine

## 2019-10-19 ENCOUNTER — Observation Stay (HOSPITAL_COMMUNITY)
Admission: EM | Admit: 2019-10-19 | Discharge: 2019-10-20 | Disposition: A | Discharge status: Home / Self Care | Payer: Medicare Other | Attending: Internal Medicine | Admitting: Internal Medicine

## 2019-10-19 DIAGNOSIS — R000 Tachycardia, unspecified: Secondary | ICD-10-CM | POA: Diagnosis not present

## 2019-10-19 DIAGNOSIS — R069 Unspecified abnormalities of breathing: Secondary | ICD-10-CM | POA: Diagnosis not present

## 2019-10-19 DIAGNOSIS — Z20822 Contact with and (suspected) exposure to covid-19: Secondary | ICD-10-CM | POA: Insufficient documentation

## 2019-10-19 DIAGNOSIS — N400 Enlarged prostate without lower urinary tract symptoms: Secondary | ICD-10-CM | POA: Diagnosis present

## 2019-10-19 DIAGNOSIS — R001 Bradycardia, unspecified: Secondary | ICD-10-CM | POA: Diagnosis not present

## 2019-10-19 DIAGNOSIS — I4891 Unspecified atrial fibrillation: Secondary | ICD-10-CM | POA: Diagnosis not present

## 2019-10-19 DIAGNOSIS — Z87891 Personal history of nicotine dependence: Secondary | ICD-10-CM | POA: Diagnosis not present

## 2019-10-19 DIAGNOSIS — Z885 Allergy status to narcotic agent status: Secondary | ICD-10-CM | POA: Insufficient documentation

## 2019-10-19 DIAGNOSIS — G20 Parkinson's disease: Secondary | ICD-10-CM | POA: Diagnosis not present

## 2019-10-19 DIAGNOSIS — R0602 Shortness of breath: Secondary | ICD-10-CM | POA: Diagnosis not present

## 2019-10-19 DIAGNOSIS — Z79899 Other long term (current) drug therapy: Secondary | ICD-10-CM | POA: Diagnosis not present

## 2019-10-19 DIAGNOSIS — I10 Essential (primary) hypertension: Secondary | ICD-10-CM | POA: Diagnosis present

## 2019-10-19 DIAGNOSIS — Z888 Allergy status to other drugs, medicaments and biological substances status: Secondary | ICD-10-CM | POA: Diagnosis not present

## 2019-10-19 DIAGNOSIS — Z881 Allergy status to other antibiotic agents status: Secondary | ICD-10-CM | POA: Diagnosis not present

## 2019-10-19 DIAGNOSIS — R0600 Dyspnea, unspecified: Secondary | ICD-10-CM | POA: Diagnosis not present

## 2019-10-19 DIAGNOSIS — G20A1 Parkinson's disease without dyskinesia, without mention of fluctuations: Secondary | ICD-10-CM | POA: Diagnosis present

## 2019-10-19 HISTORY — DX: Parkinson's disease: G20

## 2019-10-19 HISTORY — DX: Parkinson's disease without dyskinesia, without mention of fluctuations: G20.A1

## 2019-10-19 LAB — CBC WITH DIFFERENTIAL/PLATELET
Abs Immature Granulocytes: 0.04 10*3/uL (ref 0.00–0.07)
Basophils Absolute: 0.1 10*3/uL (ref 0.0–0.1)
Basophils Relative: 1 %
Eosinophils Absolute: 0 10*3/uL (ref 0.0–0.5)
Eosinophils Relative: 0 %
HCT: 46.4 % (ref 39.0–52.0)
Hemoglobin: 15.1 g/dL (ref 13.0–17.0)
Immature Granulocytes: 0 %
Lymphocytes Relative: 10 %
Lymphs Abs: 1 10*3/uL (ref 0.7–4.0)
MCH: 29.7 pg (ref 26.0–34.0)
MCHC: 32.5 g/dL (ref 30.0–36.0)
MCV: 91.3 fL (ref 80.0–100.0)
Monocytes Absolute: 0.7 10*3/uL (ref 0.1–1.0)
Monocytes Relative: 7 %
Neutro Abs: 7.8 10*3/uL — ABNORMAL HIGH (ref 1.7–7.7)
Neutrophils Relative %: 82 %
Platelets: 199 10*3/uL (ref 150–400)
RBC: 5.08 MIL/uL (ref 4.22–5.81)
RDW: 12.7 % (ref 11.5–15.5)
WBC: 9.6 10*3/uL (ref 4.0–10.5)
nRBC: 0 % (ref 0.0–0.2)

## 2019-10-19 LAB — BASIC METABOLIC PANEL
Anion gap: 14 (ref 5–15)
BUN: 23 mg/dL (ref 8–23)
CO2: 24 mmol/L (ref 22–32)
Calcium: 9.9 mg/dL (ref 8.9–10.3)
Chloride: 102 mmol/L (ref 98–111)
Creatinine, Ser: 1.2 mg/dL (ref 0.61–1.24)
GFR calc Af Amer: >60 mL/min (ref >60)
GFR calc non Af Amer: 56 mL/min — ABNORMAL LOW (ref >60)
Glucose, Bld: 138 mg/dL — ABNORMAL HIGH (ref 70–99)
Potassium: 3.9 mmol/L (ref 3.5–5.1)
Sodium: 140 mmol/L (ref 135–145)

## 2019-10-19 LAB — SARS CORONAVIRUS 2 BY RT PCR (HOSPITAL ORDER, PERFORMED IN ~~LOC~~ HOSPITAL LAB): SARS Coronavirus 2: NEGATIVE

## 2019-10-19 LAB — TROPONIN I (HIGH SENSITIVITY)
Troponin I (High Sensitivity): 10 ng/L (ref <18)
Troponin I (High Sensitivity): 14 ng/L (ref <18)

## 2019-10-19 LAB — PROTIME-INR
INR: 1.1 (ref 0.8–1.2)
Prothrombin Time: 13.6 seconds (ref 11.4–15.2)

## 2019-10-19 LAB — BRAIN NATRIURETIC PEPTIDE: B Natriuretic Peptide: 246.3 pg/mL — ABNORMAL HIGH (ref 0.0–100.0)

## 2019-10-19 LAB — TSH: TSH: 1.031 u[IU]/mL (ref 0.350–4.500)

## 2019-10-19 LAB — APTT: aPTT: 31 seconds (ref 24–36)

## 2019-10-19 LAB — MAGNESIUM: Magnesium: 2.2 mg/dL (ref 1.7–2.4)

## 2019-10-19 MED ORDER — MAGNESIUM OXIDE 400 (241.3 MG) MG PO TABS
400.0000 mg | ORAL_TABLET | Freq: Every day | ORAL | Status: DC
  Administered 2019-10-20: 400 mg via ORAL
  Filled 2019-10-19 (×2): qty 1

## 2019-10-19 MED ORDER — CARBIDOPA-LEVODOPA 25-100 MG PO TABS: 1.0000 | ORAL_TABLET | Freq: Three times a day (TID) | ORAL | Status: DC

## 2019-10-19 MED ORDER — APIXABAN 5 MG PO TABS
5.0000 mg | ORAL_TABLET | Freq: Two times a day (BID) | ORAL | Status: DC
  Administered 2019-10-19 – 2019-10-20 (×3): 5 mg via ORAL
  Filled 2019-10-19 (×4): qty 1

## 2019-10-19 MED ORDER — ALBUTEROL SULFATE (2.5 MG/3ML) 0.083% IN NEBU: 2.5000 mg | INHALATION_SOLUTION | Freq: Four times a day (QID) | RESPIRATORY_TRACT | Status: DC | PRN

## 2019-10-19 MED ORDER — PERFLUTREN LIPID MICROSPHERE
1.0000 mL | INTRAVENOUS | Status: AC | PRN
  Administered 2019-10-19: 5 mL via INTRAVENOUS
  Filled 2019-10-19: qty 10

## 2019-10-19 MED ORDER — CARBIDOPA-LEVODOPA 25-100 MG PO TABS
1.0000 | ORAL_TABLET | Freq: Once | ORAL | Status: AC
  Administered 2019-10-19: 1 via ORAL
  Filled 2019-10-19 (×2): qty 1

## 2019-10-19 MED ORDER — CARBIDOPA-LEVODOPA 25-100 MG PO TABS
1.0000 | ORAL_TABLET | Freq: Three times a day (TID) | ORAL | Status: DC
  Administered 2019-10-19 – 2019-10-20 (×4): 1 via ORAL
  Filled 2019-10-19 (×4): qty 1

## 2019-10-19 MED ORDER — DILTIAZEM LOAD VIA INFUSION
10.0000 mg | Freq: Once | INTRAVENOUS | Status: AC
  Administered 2019-10-19: 10 mg via INTRAVENOUS
  Filled 2019-10-19: qty 10

## 2019-10-19 MED ORDER — ACETAMINOPHEN 650 MG RE SUPP: 650.0000 mg | Freq: Four times a day (QID) | RECTAL | Status: DC | PRN

## 2019-10-19 MED ORDER — OFF THE BEAT BOOK
Freq: Once | Status: AC
  Filled 2019-10-19: qty 1

## 2019-10-19 MED ORDER — ACETAMINOPHEN 325 MG PO TABS: 650.0000 mg | ORAL_TABLET | Freq: Four times a day (QID) | ORAL | Status: DC | PRN

## 2019-10-19 MED ORDER — CALCIUM CARBONATE ANTACID 500 MG PO CHEW: 1.0000 | CHEWABLE_TABLET | Freq: Every day | ORAL | Status: DC | PRN

## 2019-10-19 MED ORDER — NIACIN 500 MG PO TABS
500.0000 mg | ORAL_TABLET | Freq: Two times a day (BID) | ORAL | Status: DC
  Filled 2019-10-19: qty 1

## 2019-10-19 MED ORDER — DILTIAZEM HCL-DEXTROSE 125-5 MG/125ML-% IV SOLN (PREMIX)
5.0000 mg/h | INTRAVENOUS | Status: DC
  Administered 2019-10-19 (×2): 5 mg/h via INTRAVENOUS
  Filled 2019-10-19 (×2): qty 125

## 2019-10-19 MED ORDER — TAMSULOSIN HCL 0.4 MG PO CAPS
0.4000 mg | ORAL_CAPSULE | Freq: Every day | ORAL | Status: DC
  Administered 2019-10-19 – 2019-10-20 (×2): 0.4 mg via ORAL
  Filled 2019-10-19 (×2): qty 1

## 2019-10-19 MED ORDER — SODIUM CHLORIDE 0.9% FLUSH: 3.0000 mL | Freq: Two times a day (BID) | INTRAVENOUS | Status: DC

## 2019-10-19 NOTE — ED Notes (Signed)
Pt sitting in recliner- more comfortable, wife at bedside

## 2019-10-19 NOTE — ED Provider Notes (Signed)
[facility] EMERGENCY DEPARTMENT Provider Note   CSN: [id number] Arrival date & time: 10/19/19  [id number]     History Chief Complaint  Patient presents with  . Shortness of Breath    Michael Odonnell is a 79 y.o. male.  The history is provided by the patient. No language interpreter was used.  Palpitations Palpitations quality:  Fast Onset quality:  Sudden Timing:  Constant Progression:  Worsening Chronicity:  New Context: not anxiety and not caffeine   Relieved by:  Nothing Worsened by:  Nothing Ineffective treatments:  None tried Associated symptoms: no shortness of breath   Risk factors: no heart disease   Pt complains of heart racing. Pt denies any history of atrial fibrillation.  Pt reports he thinks he may have had a rapid heart beat in the past      Past Medical History:  Diagnosis Date  . Arthritis   . Colon polyps   . Degenerative lumbar disc   . Diverticulosis   . GERD (gastroesophageal reflux disease)   . Hypertension   . Sciatica   . 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 Tremor     Patient Active Problem List   Diagnosis Date Noted  . Nausea 11/07/2017    Past Surgical History:  Procedure Laterality Date  . CLEFT LIP REPAIR    . CLEFT PALATE REPAIR    . SKIN GRAFT FULL THICKNESS LEG  12/2018       Family History  Problem Relation Age of Onset  . Heart attack Mother 22  . Heart disease Sister   . Melanoma Sister   . Heart attack Brother   . Healthy Daughter   . Healthy Daughter   . Stomach cancer Neg Hx   . Colon cancer Neg Hx     Social History   Tobacco Use  . Smoking status: Former Smoker    Types: Cigarettes    Quit date: 12/20/2012    Years since quitting: 6.8  . Smokeless tobacco: Never Used  Substance Use Topics  . Alcohol use: No  . Drug use: No    Home Medications Prior to Admission medications   Medication Sig Start Date End Date Taking? Authorizing Provider  acetaminophen  (TYLENOL) 500 MG tablet Take 1,000 mg by mouth every 6 (six) hours as needed for mild pain or headache.    Yes [provider]  amLODipine (NORVASC) 5 MG tablet Take 5 mg by mouth daily.   Yes [provider]  Calcium Carbonate Antacid (TUMS PO) Take 2 tablets by mouth daily as needed (heartburn).    Yes [provider]  carbidopa-levodopa (SINEMET IR) 25-100 MG tablet TAKE 1 TABLET BY MOUTH 3  TIMES DAILY Patient taking differently: Take 1 tablet by mouth 3 (three) times daily.  03/27/19  Yes Tat, Eustace Quail, DO  magnesium 30 MG tablet Take 500 mg by mouth daily.    Yes [provider]  metoprolol succinate (TOPROL-XL) 50 MG 24 hr tablet Take 50 mg by mouth daily. Take with or immediately following a meal.   Yes [provider]  Multiple Vitamins-Minerals (CENTRUM SILVER 50+MEN) TABS Take by mouth daily.   Yes [provider]  niacin 500 MG tablet Take 500 mg by mouth 2 (two) times daily.   Yes [provider]  perindopril (ACEON) 8 MG tablet Take 8 mg by mouth daily. 12/30/17  Yes [provider]  polyethylene glycol (MIRALAX / GLYCOLAX) packet  Take 17 g by mouth daily as needed for mild constipation.    Yes [provider]  tamsulosin (FLOMAX) 0.4 MG CAPS capsule Take 1 capsule (0.4 mg total) by mouth daily. 12/22/16  Yes Mackuen, Courteney Lyn, MD    Allergies    Codeine, Doxycycline, and Metronidazole  Review of Systems   Review of Systems  Respiratory: Negative for shortness of breath.   Cardiovascular: Positive for palpitations.  All other systems reviewed and are negative.   Physical Exam Updated Vital Signs BP (!) 155/93 (BP Location: Right Arm)   Pulse (!) 126   Temp 98.7 F (37.1 C)   Resp 15   Ht 5\' 9"  (1.753 m)   Wt 77.1 kg   SpO2 96%   BMI 25.10 kg/m   Physical Exam Vitals and nursing note reviewed.  Constitutional:      Appearance: He is well-developed.  HENT:     Head: Normocephalic  and atraumatic.  Eyes:     Conjunctiva/sclera: Conjunctivae normal.     Pupils: Pupils are equal, round, and reactive to light.  Cardiovascular:     Rate and Rhythm: Tachycardia present. Rhythm irregular.     Heart sounds: No murmur.  Pulmonary:     Effort: Pulmonary effort is normal. No respiratory distress.     Breath sounds: Normal breath sounds. No decreased breath sounds.  Chest:     Chest wall: No deformity.  Abdominal:     Palpations: Abdomen is soft.     Tenderness: There is no abdominal tenderness.  Musculoskeletal:        General: Normal range of motion.     Cervical back: Neck supple.  Skin:    General: Skin is warm and dry.  Neurological:     Mental Status: He is alert.  Psychiatric:        Mood and Affect: Mood normal.     ED Results / Procedures / Treatments   Labs (all labs ordered are listed, but only abnormal results are displayed) Labs Reviewed  CBC WITH DIFFERENTIAL/PLATELET - Abnormal; Notable for the following components:      Result Value   Neutro Abs 7.8 (*)    All other components within normal limits  BASIC METABOLIC PANEL - Abnormal; Notable for the following components:   Glucose, Bld 138 (*)    GFR calc non Af Amer 56 (*)    All other components within normal limits  BRAIN NATRIURETIC PEPTIDE - Abnormal; Notable for the following components:   B Natriuretic Peptide 246.3 (*)    All other components within normal limits  SARS CORONAVIRUS 2 BY RT PCR (HOSPITAL ORDER, [facility])  MAGNESIUM  TSH  TROPONIN I (HIGH SENSITIVITY)  TROPONIN I (HIGH SENSITIVITY)    EKG EKG Interpretation  Date/Time:  Monday Oct 19 2019 06:07:07 EDT Ventricular Rate:  125 PR Interval:    QRS Duration: 88 QT Interval:  309 QTC Calculation: 422 R Axis:   -65 Text Interpretation: Atrial fibrillation Left anterior fascicular block Abnormal R-wave progression, late transition No old tracing to compare Confirmed by Ward, Cyril Mourning [phone])  on 10/19/2019 6:08:47 AM   Radiology DG Chest Portable 1 View  Result Date: 10/19/2019 CLINICAL DATA:  Shortness of breath EXAM: PORTABLE CHEST 1 VIEW COMPARISON:  None. FINDINGS: Probable calcified granuloma overlying the right lung base. No consolidation or edema. No pleural effusion or pneumothorax. Cardiomediastinal contours are within normal limits. IMPRESSION: No acute process in the chest. Electronically Signed  By: Macy Mis M.D.   On: 10/19/2019 07:37    Procedures .Critical Care Performed by: Fransico Meadow, PA-C Authorized by: Fransico Meadow, PA-C   Critical care provider statement:    Critical care time (minutes):  60   Critical care start time:  10/19/2019 6:30 AM   Critical care end time:  10/19/2019 10:09 AM   Critical care time was exclusive of:  Separately billable procedures and treating other patients   Critical care was time spent personally by me on the following activities:  Ordering and performing treatments and interventions, pulse oximetry, discussions with consultants, evaluation of patient's response to treatment, examination of patient, interpretation of cardiac output measurements, re-evaluation of patient's condition, ordering and review of laboratory studies and ordering and review of radiographic studies   (including critical care time)  Medications Ordered in ED Medications  diltiazem (CARDIZEM) 1 mg/mL load via infusion 10 mg (10 mg Intravenous Bolus from Bag 10/19/19 0727)    And  diltiazem (CARDIZEM) 125 mg in dextrose 5% 125 mL (1 mg/mL) infusion (5 mg/hr Intravenous New Bag/Given 10/19/19 0725)  carbidopa-levodopa (SINEMET IR) 25-100 MG per tablet immediate release 1 tablet (1 tablet Oral Given 10/19/19 [id number])    ED Course  I have reviewed the triage vital signs and the nursing notes.  Pertinent labs & imaging results that were available during my care of the patient were reviewed by me and considered in my medical decision making (see chart  for details).  Clinical Course as of Oct 19 951  Mon Oct 19, 2019  0820 SARS Coronavirus 2 by RT PCR (hospital order, performed in [facility] hospital lab) Nasopharyngeal Nasopharyngeal Swab [LS]    Clinical Course User Index [LS] Fransico Meadow, PA-C   MDM Rules/Calculators/A&P                      MDM: Pt started on cardizem bolus and drip.  Pt's heart rae decreased from 130'2 to 105.  Pt reports he feels better.  MDM Number of Diagnoses or Management Options   Amount and/or Complexity of Data Reviewed Clinical lab tests: ordered and reviewed Tests in the radiology section of CPT: ordered and reviewed Tests in the medicine section of CPT: ordered Discussion of test results with the performing providers: yes Review and summarize past medical records: yes Discuss the patient with other providers: yes Independent visualization of images, tracings, or specimens: yes  Risk of Complications, Morbidity, and/or Mortality Presenting problems: high Diagnostic procedures: high Management options: high  Critical Care Total time providing critical care: 30-74 minutes  Patient Progress Patient progress: stable  Final Clinical Impression(s) / ED Diagnoses Final diagnoses:  Atrial fibrillation with RVR ([place])  New onset a-fib ([place])    Rx / DC Orders ED Discharge Orders    None    I consulted hospitalist  Dr. Tamala Julian will admit.   Fransico Meadow, PA-C 10/19/19 1011    Ward, Delice Bison, DO 10/19/19 2317

## 2019-10-19 NOTE — ED Triage Notes (Addendum)
Pt. Arrived [facility] from home c/o SOB and heart fluttering with exertion that started approximately at 9:30 lastnight. PMH: Parkinsons

## 2019-10-19 NOTE — Discharge Instructions (Addendum)

## 2019-10-19 NOTE — Progress Notes (Addendum)
  Echocardiogram 2D Echocardiogram has been performed with [facility].  Michael Odonnell 10/19/2019, 5:46 PM

## 2019-10-19 NOTE — ED Notes (Signed)
Cardiologist at bedside. Pt is more comfortable in recliner- ambulates without difficulty to bathroom- needs help pushing IV and monitor wires

## 2019-10-19 NOTE — Progress Notes (Signed)
Transitions of Care Pharmacist Note  Michael Odonnell is a 79 y.o. male that has been diagnosed with A Fib and will be prescribed Eliquis (apixaban) at discharge.   Patient Education: I provided the following education on Eliquis 10/19/19 to the patient and wife: How to take the medication Described what the medication is Signs of bleeding Signs/symptoms of VTE and stroke  Answered their questions  Discharge Medications Plan: The patient wants to have their discharge medications filled by the Transitions of Care pharmacy rather than their usual pharmacy.  The discharge orders pharmacy has been changed to the Transitions of Care pharmacy, the patient will receive a phone call regarding co-pay, and their medications will be delivered by the Transitions of Care pharmacy.    Thank you,   Sherren Kerns, PharmD PGY1 Acute Care Pharmacy Resident Oct 19, 2019

## 2019-10-19 NOTE — H&P (Addendum)
History and Physical    Michael Odonnell [id number] DOB: 02-Feb-1941 DOA: 10/19/2019  Referring MD/NP/PA:Karen Sophia, PA-C   PCP: Reynold Bowen, MD  Patient coming from: Home via EMS   Chief Complaint: Palpitations  I have personally briefly reviewed patient's old medical records in [place]   HPI: Michael Odonnell is a 79 y.o. male with medical history significant of hypertension, Parkinson's disease, and BPH presented with complaints of palpitations starting last night around 10:30 PM.  His wife checked his blood pressure which was elevated at the time his heart rates were noted to be into the 150s.  Noted associated symptoms of feeling uncomfortable and weak.  Denies any loss of consciousness or fall.  EMS was called at that time, but he declined transfer.  Then around 5 AM this morning patient reported symptoms of getting no better and he was having difficulty getting to the restroom.  EMS was again called and brought him to the hospital.  Wife notes that back in 2013 he had palpitation for which he was admitted into the hospital, and started on blood pressure medications at that time.  He underwent stress testing, but it was noted to be within normal limits.  They do not remember being told that he had atrial fibrillation at that time.  ED Course: Upon admission into the emergency department patient was noted to be in atrial fibrillation with heart rates into the 120s.  Labs significant for BNP 246.3 and troponin 10.  Chest x-ray was otherwise noted to be unremarkable.  Patient was given 10 mg of Cardizem IV and then started on a Cardizem drip.  TRH called to admit.  Review of Systems  Constitutional: Positive for malaise/fatigue. Negative for chills and fever.  HENT: Negative for congestion and ear discharge.   Eyes: Negative for photophobia and pain.  Respiratory: Negative for cough and shortness of breath.   Cardiovascular: Positive for palpitations. Negative for chest pain.    Gastrointestinal: Positive for melena. Negative for abdominal pain, nausea and vomiting.  Genitourinary: Negative for dysuria and hematuria.  Musculoskeletal: Negative for falls.  Neurological: Positive for dizziness and tremors. Negative for loss of consciousness.  Psychiatric/Behavioral: Negative for substance abuse. The patient has insomnia.     Past Medical History:  Diagnosis Date  . Arthritis   . Colon polyps   . Degenerative lumbar disc   . Diverticulosis   . GERD (gastroesophageal reflux disease)   . Hypertension   . Sciatica   . 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 Tremor     Past Surgical History:  Procedure Laterality Date  . CLEFT LIP REPAIR    . CLEFT PALATE REPAIR    . SKIN GRAFT FULL THICKNESS LEG  12/2018     reports that he quit smoking about 6 years ago. His smoking use included cigarettes. He has never used smokeless tobacco. He reports that he does not drink alcohol or use drugs.  Allergies  Allergen Reactions  . Codeine Nausea And Vomiting  . Doxycycline Nausea Only  . Metronidazole Nausea Only    Family History  Problem Relation Age of Onset  . Heart attack Mother 66  . Heart disease Sister   . Melanoma Sister   . Heart attack Brother   . Healthy Daughter   . Healthy Daughter   . Stomach cancer Neg Hx   . Colon cancer Neg Hx     Prior to Admission medications   Medication Sig  Start Date End Date Taking? Authorizing Provider  acetaminophen (TYLENOL) 500 MG tablet Take 1,000 mg by mouth every 6 (six) hours as needed for mild pain or headache.    Yes [provider]  amLODipine (NORVASC) 5 MG tablet Take 5 mg by mouth daily.   Yes [provider]  Calcium Carbonate Antacid (TUMS PO) Take 2 tablets by mouth daily as needed (heartburn).    Yes [provider]  carbidopa-levodopa (SINEMET IR) 25-100 MG tablet TAKE 1 TABLET BY MOUTH 3  TIMES DAILY Patient taking differently: Take 1 tablet by mouth 3  (three) times daily.  03/27/19  Yes Tat, Eustace Quail, DO  magnesium 30 MG tablet Take 500 mg by mouth daily.    Yes [provider]  metoprolol succinate (TOPROL-XL) 50 MG 24 hr tablet Take 50 mg by mouth daily. Take with or immediately following a meal.   Yes [provider]  Multiple Vitamins-Minerals (CENTRUM SILVER 50+MEN) TABS Take by mouth daily.   Yes [provider]  niacin 500 MG tablet Take 500 mg by mouth 2 (two) times daily.   Yes [provider]  perindopril (ACEON) 8 MG tablet Take 8 mg by mouth daily. 12/30/17  Yes [provider]  polyethylene glycol (MIRALAX / GLYCOLAX) packet Take 17 g by mouth daily as needed for mild constipation.    Yes [provider]  tamsulosin (FLOMAX) 0.4 MG CAPS capsule Take 1 capsule (0.4 mg total) by mouth daily. 12/22/16  Yes Mackuen, Fredia Sorrow, MD    Physical Exam:  Constitutional: Elderly male currently in NAD, calm, comfortable Vitals:   10/19/19 0602 10/19/19 0606 10/19/19 0607 10/19/19 0612  BP:   (!) 155/93   Pulse:   (!) 126   Resp:   15   Temp:   98.7 F (37.1 C)   SpO2: 90% 95% 96%   Weight:    77.1 kg  Height:    5\' 9"  (1.753 m)   Eyes: PERRL, lids and conjunctivae normal ENMT: Mucous membranes are moist. Posterior pharynx clear of any exudate or lesions. Normal dentition.  Neck: normal, supple, no masses, no thyromegaly Respiratory: clear to auscultation bilaterally, no wheezing, no crackles. Normal respiratory effort. No accessory muscle use.  Cardiovascular: Irregular irregular, no murmurs / rubs / gallops. No extremity edema. 2+ pedal pulses. No carotid bruits.  Abdomen: no tenderness, no masses palpated. No hepatosplenomegaly. Bowel sounds positive.  Musculoskeletal: no clubbing / cyanosis. No joint deformity upper and lower extremities. Good ROM, no contractures. Normal muscle tone.  Skin: no rashes, lesions, ulcers. No induration Neurologic: CN 2-12 grossly intact.  Sensation intact, DTR normal. Strength 5/5 in all 4.  Tremor present. Psychiatric: Normal judgment and insight. Alert and oriented x 3. Normal mood.     Labs on Admission: I have personally reviewed following labs and imaging studies  CBC: Recent Labs  Lab 10/19/19 0720  WBC 9.6  NEUTROABS 7.8*  HGB 15.1  HCT 46.4  MCV 91.3  PLT 123XX123   Basic Metabolic Panel: Recent Labs  Lab 10/19/19 0720  NA 140  K 3.9  CL 102  CO2 24  GLUCOSE 138*  BUN 23  CREATININE 1.20  CALCIUM 9.9  MG 2.2   GFR: Estimated Creatinine Clearance: 48.3 mL/min (by C-G formula based on SCr of 1.2 mg/dL). Liver Function Tests: No results for input(s): AST, ALT, ALKPHOS, BILITOT, PROT, ALBUMIN in the last 168 hours. No results for input(s): LIPASE, AMYLASE in the last 168 hours.  No results for input(s): AMMONIA in the last 168 hours. Coagulation Profile: No results for input(s): INR, PROTIME in the last 168 hours. Cardiac Enzymes: No results for input(s): CKTOTAL, CKMB, CKMBINDEX, TROPONINI in the last 168 hours. BNP (last 3 results) No results for input(s): PROBNP in the last 8760 hours. HbA1C: No results for input(s): HGBA1C in the last 72 hours. CBG: No results for input(s): GLUCAP in the last 168 hours. Lipid Profile: No results for input(s): CHOL, HDL, LDLCALC, TRIG, CHOLHDL, LDLDIRECT in the last 72 hours. Thyroid Function Tests: Recent Labs    10/19/19 0720  TSH 1.031   Anemia Panel: No results for input(s): VITAMINB12, FOLATE, FERRITIN, TIBC, IRON, RETICCTPCT in the last 72 hours. Urine analysis:    Component Value Date/Time   COLORURINE STRAW (A) 12/22/2016 1557   APPEARANCEUR CLEAR 12/22/2016 1557   LABSPEC 1.005 12/22/2016 1557   PHURINE 6.0 12/22/2016 1557   GLUCOSEU NEGATIVE 12/22/2016 1557   HGBUR SMALL (A) 12/22/2016 1557   BILIRUBINUR NEGATIVE 12/22/2016 1557   KETONESUR NEGATIVE 12/22/2016 1557   PROTEINUR NEGATIVE 12/22/2016 1557   NITRITE NEGATIVE 12/22/2016 1557     LEUKOCYTESUR NEGATIVE 12/22/2016 1557   Sepsis Labs: Recent Results (from the past 240 hour(s))  SARS Coronavirus 2 by RT PCR (hospital order, performed in [place] [facility] hospital lab) Nasopharyngeal Nasopharyngeal Swab     Status: None   Collection Time: 10/19/19  8:50 AM   Specimen: Nasopharyngeal Swab  Result Value Ref Range Status   SARS Coronavirus 2 NEGATIVE NEGATIVE Final    Comment: (NOTE) SARS-CoV-2 target nucleic acids are NOT DETECTED. The SARS-CoV-2 RNA is generally detectable in upper and lower respiratory specimens during the acute phase of infection. The lowest concentration of SARS-CoV-2 viral copies this assay can detect is 250 copies / mL. A negative result does not preclude SARS-CoV-2 infection and should not be used as the sole basis for treatment or other patient management decisions.  A negative result may occur with improper specimen collection / handling, submission of specimen other than nasopharyngeal swab, presence of viral mutation(s) within the areas targeted by this assay, and inadequate number of viral copies (<250 copies / mL). A negative result must be combined with clinical observations, patient history, and epidemiological information. Fact Sheet for Patients:   StrictlyIdeas.no Fact Sheet for Healthcare Providers: BankingDealers.co.za This test is not yet approved or cleared  by the [place] FDA and has been authorized for detection and/or diagnosis of SARS-CoV-2 by FDA under an Emergency Use Authorization (EUA).  This EUA will remain in effect (meaning this test can be used) for the duration of the COVID-19 declaration under Section 564(b)(1) of the Act, 21 U.S.C. section 360bbb-3(b)(1), unless the authorization is terminated or revoked sooner. Performed at [facility], [place] [address] [place], [place] [address]      Radiological Exams on Admission: DG Chest Portable 1 View  Result  Date: 10/19/2019 CLINICAL DATA:  Shortness of breath EXAM: PORTABLE CHEST 1 VIEW COMPARISON:  None. FINDINGS: Probable calcified granuloma overlying the right lung base. No consolidation or edema. No pleural effusion or pneumothorax. Cardiomediastinal contours are within normal limits. IMPRESSION: No acute process in the chest. Electronically Signed   By: Macy Mis M.D.   On: 10/19/2019 07:37    EKG: Independently reviewed.  Atrial fibrillation at 125 bpm  Assessment/Plan Atrial fibrillation with RVR: Acute.  Patient presents with complaints of palpitations.  Found to be in A. fib with RVR with heart rates into  the 150s prior to arrival.  Patient reports prior history of palpitations, but never been formally diagnosed with atrial fibrillation.  CHA2DS2-VASc score = 3 based off age and history of hypertension.  Patient would benefit from being placed on anticoagulation. -Admit to a progressive bed -Continue Cardizem drip -Check echocardiogram -Eliquis per pharmacy -Cardiology consulted, we will follow-up for further recommendation  Essential hypertension: Blood pressures currently controlled.  Home blood pressure medications include amlodipine 5 mg daily, metoprolol 50 mg daily, and periodopril 80 mg daily. -Holding home blood pressure medication while on Cardizem  Parkinson's disease: Medications include carbidopa-levodopa 25-100 mg 3 times daily. -Continue carbidopa-levodopa  BPH -Continue Flomax  DVT prophylaxis: Eliquis Code Status: Full Family Communication: Wife updated at bed Disposition Plan: Likely discharge home once medically stable Consults called: Cards Admission status: Observation  Norval Morton MD [facility] Pager [phone]   If 7PM-7AM, please contact night-coverage [email or web address] Password [email or web address]  10/19/2019, 10:09 AM

## 2019-10-19 NOTE — Consult Note (Signed)
Cardiology Consultation:   Patient ID: Michael Odonnell MRN: [id number]; DOB: July 16, 1940  Admit date: 10/19/2019 Date of Consult: 10/19/2019  Primary Care Provider: Reynold Bowen, MD Primary Cardiologist: New  Patient Profile:   Michael Odonnell is a 79 y.o. male with a hx of, hypertension, Gastrosoft reflux disease who is being seen today for the evaluation of new onset atrial fibrillation at the request of Fuller Plan, MD.  History of Present Illness:   No prior cardiac history.  Patient has had intermittent palpitations in the past described as his heart skipping.  He developed the same sensation yesterday at approximately 5 PM.  He otherwise denies dyspnea, chest pain, syncope or bleeding.  When his symptoms did not improve EMS was called and he was brought to the emergency room.  He has been found to be in atrial fibrillation and cardiology now asked to evaluate.   Past Medical History:  Diagnosis Date  . Arthritis   . Colon polyps   . Degenerative lumbar disc   . Diverticulosis   . GERD (gastroesophageal reflux disease)   . Hypertension   . Parkinson's disease ([place])   . Sciatica   . 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 Tremor     Past Surgical History:  Procedure Laterality Date  . CLEFT LIP REPAIR    . CLEFT PALATE REPAIR    . SKIN GRAFT FULL THICKNESS LEG  12/2018     Inpatient Medications: Scheduled Meds: . apixaban  5 mg Oral BID  . carbidopa-levodopa  1 tablet Oral TID AC  . [START ON 10/20/2019] magnesium oxide  400 mg Oral Daily  . sodium chloride flush  3 mL Intravenous Q12H  . tamsulosin  0.4 mg Oral Daily   Continuous Infusions: . diltiazem (CARDIZEM) infusion 5 mg/hr (10/19/19 0725)   PRN Meds: acetaminophen **OR** acetaminophen, albuterol, calcium carbonate  Allergies:    Allergies  Allergen Reactions  . Codeine Nausea And Vomiting  . Doxycycline Nausea Only  . Metronidazole Nausea Only    Social History:   Social History    Socioeconomic History  . Marital status: Married    Spouse name: Not on file  . Number of children: 2  . Years of education: Not on file  . Highest education level: Bachelor's degree (e.g., BA, AB, BS)  Occupational History  . 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 Smoking status: Former Smoker    Types: Cigarettes    Quit date: 12/20/2012    Years since quitting: 6.8  . Smokeless tobacco: Never Used  Substance and Sexual Activity  . Alcohol use: No  . Drug use: No  . Sexual activity: Not Currently  Other Topics Concern  . Not on file  Social History Narrative  . Not on file   Social Determinants of Health   Financial Resource Strain:   . Difficulty of Paying Living Expenses:   Food Insecurity:   . Worried About Charity fundraiser in the Last Year:   . Arboriculturist in the Last Year:   Transportation Needs:   . Film/video editor (Medical):   Marland Kitchen Lack of Transportation (Non-Medical):   Physical Activity:   . Days of Exercise per Week:   . Minutes of Exercise per Session:   Stress:   . Feeling of Stress :   Social Connections:   . Frequency of Communication with Friends and  Family:   . Frequency of Social Gatherings with Friends and Family:   . Attends Religious Services:   . Active Member of Clubs or Organizations:   . Attends Archivist Meetings:   Marland Kitchen Marital Status:   Intimate Partner Violence:   . Fear of Current or Ex-Partner:   . Emotionally Abused:   Marland Kitchen Physically Abused:   . Sexually Abused:     Family History:    Family History  Problem Relation Age of Onset  . Heart attack Mother 36  . Heart disease Sister   . Melanoma Sister   . Heart attack Brother   . Healthy Daughter   . Healthy Daughter   . Stomach cancer Neg Hx   . Colon cancer Neg Hx      ROS:  Please see the history of present illness.  No fevers, chills, productive cough, hemoptysis or diarrhea. All other  ROS reviewed and negative.     Physical Exam/Data:   Vitals:   10/19/19 1000 10/19/19 1030 10/19/19 1045 10/19/19 1100  BP: 120/61 110/63 108/76 114/74  Pulse: 93 91 (!) 109 64  Resp: (!) 21 14 19  (!) 25  Temp:      SpO2: 98% 96% 97% 95%  Weight:      Height:       No intake or output data in the 24 hours ending 10/19/19 1438 Last 3 Weights 10/19/2019 12/25/2018 08/01/2018  Weight (lbs) 170 lb 172 lb 170 lb  Weight (kg) 77.111 kg 78.019 kg 77.111 kg     Body mass index is 25.1 kg/m.  General:  Well nourished, well developed, in no acute distress HEENT: normal Lymph: no adenopathy Neck: no JVD Endocrine:  No thryomegaly Vascular: No carotid bruits; FA pulses 2+ bilaterally without bruits  Cardiac:  normal S1, S2; irregular, no murmur Lungs:  clear to auscultation bilaterally, no wheezing, rhonchi or rales  Abd: soft, nontender, no hepatomegaly  Ext: no edema Musculoskeletal:  No deformities, BUE and BLE strength normal and equal Skin: warm and dry  Neuro:  CNs 2-12 intact, no focal abnormalities noted; flat affect and resting tremor noted Psych:  Flat affect   EKG:  The EKG was personally reviewed and demonstrates: Atrial fibrillation, left axis deviation.   Laboratory Data:  High Sensitivity Troponin:   Recent Labs  Lab 10/19/19 0720  TROPONINIHS 10     Chemistry Recent Labs  Lab 10/19/19 0720  NA 140  K 3.9  CL 102  CO2 24  GLUCOSE 138*  BUN 23  CREATININE 1.20  CALCIUM 9.9  GFRNONAA 56*  GFRAA >60  ANIONGAP 14   Hematology Recent Labs  Lab 10/19/19 0720  WBC 9.6  RBC 5.08  HGB 15.1  HCT 46.4  MCV 91.3  MCH 29.7  MCHC 32.5  RDW 12.7  PLT 199   BNP Recent Labs  Lab 10/19/19 0720  BNP 246.3*      Radiology/Studies:  DG Chest Portable 1 View  Result Date: 10/19/2019 CLINICAL DATA:  Shortness of breath EXAM: PORTABLE CHEST 1 VIEW COMPARISON:  None. FINDINGS: Probable calcified granuloma overlying the right lung base. No consolidation  or edema. No pleural effusion or pneumothorax. Cardiomediastinal contours are within normal limits. IMPRESSION: No acute process in the chest. Electronically Signed   By: Macy Mis M.D.   On: 10/19/2019 07:37     Assessment and Plan:   1. New onset atrial fibrillation-patient presents with new onset atrial fibrillation.  We will arrange an echocardiogram  to assess LV function.  TSH is normal.  His rate has improved and we will continue with IV Cardizem at present rate.  Transition to oral tomorrow. CHADSvasc 3.  Add apixaban 5 mg twice daily.  His symptoms have improved with addition of Cardizem/rate control.  If he feels well tomorrow morning patient will be discharged and will plan cardioversion in approximately 4 weeks.  His symptoms are less than 24 hours but he has had intermittent palpitations in the past and I would therefore be hesitant to cardiovert at this point unless TEE was performed.  If he becomes symptomatic or his rate is difficult to control can proceed with TEE guided cardioversion prior to discharge. 2. Hypertension-we will hold home blood pressure medications as we are treating his atrial fibrillation with Cardizem which may control blood pressure as well.  We will follow and adjust regimen as needed. 3. Parkinson's-Per primary care.  For questions or updates, please contact [place] Please consult [email or web address] for contact info under     Signed, Kirk Ruths, MD  10/19/2019 2:38 PM

## 2019-10-20 ENCOUNTER — Ambulatory Visit: Payer: Medicare Other | Admitting: Neurology

## 2019-10-20 DIAGNOSIS — I4891 Unspecified atrial fibrillation: Secondary | ICD-10-CM | POA: Diagnosis not present

## 2019-10-20 LAB — ECHOCARDIOGRAM COMPLETE
Height: 69 in
Weight: 2720 oz

## 2019-10-20 LAB — BASIC METABOLIC PANEL
Anion gap: 10 (ref 5–15)
BUN: 21 mg/dL (ref 8–23)
CO2: 28 mmol/L (ref 22–32)
Calcium: 9.1 mg/dL (ref 8.9–10.3)
Chloride: 103 mmol/L (ref 98–111)
Creatinine, Ser: 1.24 mg/dL (ref 0.61–1.24)
GFR calc Af Amer: >60 mL/min (ref >60)
GFR calc non Af Amer: 54 mL/min — ABNORMAL LOW (ref >60)
Glucose, Bld: 118 mg/dL — ABNORMAL HIGH (ref 70–99)
Potassium: 4 mmol/L (ref 3.5–5.1)
Sodium: 141 mmol/L (ref 135–145)

## 2019-10-20 LAB — CBC
HCT: 43.4 % (ref 39.0–52.0)
Hemoglobin: 14.3 g/dL (ref 13.0–17.0)
MCH: 30.2 pg (ref 26.0–34.0)
MCHC: 32.9 g/dL (ref 30.0–36.0)
MCV: 91.8 fL (ref 80.0–100.0)
Platelets: 208 10*3/uL (ref 150–400)
RBC: 4.73 MIL/uL (ref 4.22–5.81)
RDW: 13 % (ref 11.5–15.5)
WBC: 10.3 10*3/uL (ref 4.0–10.5)
nRBC: 0 % (ref 0.0–0.2)

## 2019-10-20 MED ORDER — APIXABAN 5 MG PO TABS: 5.0000 mg | ORAL_TABLET | Freq: Two times a day (BID) | ORAL | 0 refills | Status: DC

## 2019-10-20 MED ORDER — MAGNESIUM 30 MG PO TABS: 30.0000 mg | ORAL_TABLET | Freq: Every day | ORAL | 0 refills | Status: DC

## 2019-10-20 MED ORDER — MAGNESIUM OXIDE -MG SUPPLEMENT 400 (240 MG) MG PO TABS: 1.0000 | ORAL_TABLET | Freq: Every day | ORAL | 0 refills | Status: AC

## 2019-10-20 MED ORDER — DILTIAZEM HCL ER COATED BEADS 180 MG PO CP24
180.0000 mg | ORAL_CAPSULE | Freq: Every day | ORAL | Status: DC
  Administered 2019-10-20: 180 mg via ORAL
  Filled 2019-10-20: qty 1

## 2019-10-20 MED ORDER — DILTIAZEM HCL ER COATED BEADS 180 MG PO CP24: 180.0000 mg | ORAL_CAPSULE | Freq: Every day | ORAL | 0 refills | Status: DC

## 2019-10-20 MED FILL — Perflutren Lipid Microsphere IV Susp 1.1 MG/ML: INTRAVENOUS | Qty: 10 | Status: AC

## 2019-10-20 NOTE — Progress Notes (Signed)
Pt ambulated in hallway with NT, O2 sats 97% before walking, during walking and after walking. Pt tolerated walk well NT reported.

## 2019-10-20 NOTE — Progress Notes (Signed)
Discharge instructions reviewed with pt and wife.  Copy of instructions  given to pt/wife, new scripts were filled by [facility] and delivered to bedside to pt/wife. Handouts of new meds were included in discharge instructions.  Pt d/c'd via wheelchair with belongings, with wife.             Escorted by unit NT.

## 2019-10-20 NOTE — Progress Notes (Signed)
Progress Note  Patient Name: Michael Odonnell Date of Encounter: 10/20/2019  Primary Cardiologist: Dr Stanford Breed  Subjective   No CP or dyspnea  Inpatient Medications    Scheduled Meds: . apixaban  5 mg Oral BID  . carbidopa-levodopa  1 tablet Oral TID AC  . magnesium oxide  400 mg Oral Daily  . sodium chloride flush  3 mL Intravenous Q12H  . tamsulosin  0.4 mg Oral Daily   Continuous Infusions: . diltiazem (CARDIZEM) infusion 5 mg/hr (10/19/19 2253)   PRN Meds: acetaminophen **OR** acetaminophen, albuterol, calcium carbonate   Vital Signs    Vitals:   10/20/19 0349 10/20/19 0351 10/20/19 0355 10/20/19 0738  BP: (!) 146/127  (!) 164/96 127/86  Pulse: 80 (!) 131 96 97  Resp: 20  20 19   Temp: 97.7 F (36.5 C)  97.9 F (36.6 C) 98.2 F (36.8 C)  TempSrc: Oral  Oral Oral  SpO2: 97% 98% 99% 98%  Weight:   74.9 kg   Height:        Intake/Output Summary (Last 24 hours) at 10/20/2019 0800 Last data filed at 10/20/2019 0400 Gross per 24 hour  Intake 492.2 ml  Output 425 ml  Net 67.2 ml   Last 3 Weights 10/20/2019 10/19/2019 12/25/2018  Weight (lbs) 165 lb 1.6 oz 170 lb 172 lb  Weight (kg) 74.889 kg 77.111 kg 78.019 kg      Telemetry    Atrial fibrillation- Personally Reviewed   Physical Exam   GEN: No acute distress.   Neck: No JVD Cardiac: irregular Respiratory: Clear to auscultation bilaterally. GI: Soft, nontender, non-distended  MS: No edema Neuro:  Resting tremor Psych: Normal affect   Labs    High Sensitivity Troponin:   Recent Labs  Lab 10/19/19 0720 10/19/19 1445  TROPONINIHS 10 14      Chemistry Recent Labs  Lab 10/19/19 0720 10/20/19 0304  NA 140 141  K 3.9 4.0  CL 102 103  CO2 24 28  GLUCOSE 138* 118*  BUN 23 21  CREATININE 1.20 1.24  CALCIUM 9.9 9.1  GFRNONAA 56* 54*  GFRAA >60 >60  ANIONGAP 14 10     Hematology Recent Labs  Lab 10/19/19 0720 10/20/19 0304  WBC 9.6 10.3  RBC 5.08 4.73  HGB 15.1 14.3  HCT 46.4 43.4   MCV 91.3 91.8  MCH 29.7 30.2  MCHC 32.5 32.9  RDW 12.7 13.0  PLT 199 208    BNP Recent Labs  Lab 10/19/19 0720  BNP 246.3*      Radiology    DG Chest Portable 1 View  Result Date: 10/19/2019 CLINICAL DATA:  Shortness of breath EXAM: PORTABLE CHEST 1 VIEW COMPARISON:  None. FINDINGS: Probable calcified granuloma overlying the right lung base. No consolidation or edema. No pleural effusion or pneumothorax. Cardiomediastinal contours are within normal limits. IMPRESSION: No acute process in the chest. Electronically Signed   By: Macy Mis M.D.   On: 10/19/2019 07:37   ECHOCARDIOGRAM COMPLETE  Result Date: 10/20/2019    ECHOCARDIOGRAM REPORT   Patient Name:   Michael Odonnell Date of Exam: 10/19/2019 Medical Rec #:  [id number]     Height:       69.0 in Accession #:    [id number]    Weight:       170.0 lb Date of Birth:  03/06/1941     BSA:          1.928 m Patient Age:    79 years  BP:           112/69 mmHg Patient Gender: M             HR:           119 bpm. Exam Location:  Inpatient Procedure: 2D Echo, Cardiac Doppler, Color Doppler and Intracardiac            Opacification Agent Indications:    Atrial fibrillation  History:        Patient has no prior history of Echocardiogram examinations.  Sonographer:    Clayton Lefort RDCS (AE) Referring Phys: [id number] [facility]  Sonographer Comments: No subcostal window. Poor Doppler angles for aortic valve in five chamber view with patient on back and LLD position. IMPRESSIONS  1. Left ventricular ejection fraction, by estimation, is 65 to 70%. The left ventricle has normal function. The left ventricle has no regional wall motion abnormalities. There is mild left ventricular hypertrophy. Left ventricular diastolic parameters are indeterminate.  2. Right ventricular systolic function is normal. The right ventricular size is normal. Tricuspid regurgitation signal is inadequate for assessing PA pressure.  3. The mitral valve is normal in structure. No  evidence of mitral valve regurgitation.  4. The aortic valve was not well visualized. Aortic valve regurgitation is not visualized. No aortic stenosis is present.  5. Aortic dilatation noted. 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 FINDINGS  Left Ventricle: Left ventricular ejection fraction, by estimation, is 65 to 70%. The left ventricle has normal function. The left ventricle has no regional wall motion abnormalities. Definity contrast agent was given IV to delineate the left ventricular  endocardial borders. The left ventricular internal cavity size was normal in size. There is mild left ventricular hypertrophy. Left ventricular diastolic parameters are indeterminate. Right Ventricle: The right ventricular size is normal. Right vetricular wall thickness was not assessed. Right ventricular systolic function is normal. Tricuspid regurgitation signal is inadequate for assessing PA pressure. Left Atrium: Left atrial size was normal in size. Right Atrium: Right atrial size was normal in size. Pericardium: A small pericardial effusion is present. Presence of pericardial fat pad. Mitral Valve: The mitral valve is normal in structure. Moderate mitral annular calcification. No evidence of mitral valve regurgitation. Tricuspid Valve: The tricuspid valve is normal in structure. Tricuspid valve regurgitation is not demonstrated. Aortic Valve: The aortic valve was not well visualized. Aortic valve regurgitation is not visualized. No aortic stenosis is present. Pulmonic Valve: The pulmonic valve was not well visualized. Pulmonic valve regurgitation is not visualized. Aorta: Aortic dilatation noted. 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 IAS/Shunts: The interatrial septum was not well visualized.  LEFT VENTRICLE PLAX 2D LVIDd:         3.90 cm LVIDs:         2.50 cm LV PW:         1.00 cm LV IVS:        1.10 cm LVOT diam:     1.90 cm LV SV:         39 LV SV Index:   20 LVOT  Area:     2.84 cm  RIGHT VENTRICLE RV Basal diam:  3.30 cm RV S prime:     15.30 cm/s TAPSE (M-mode): 2.1 cm LEFT ATRIUM           Index       RIGHT ATRIUM           Index LA diam:  3.00 cm 1.56 cm/m  RA Area:     14.10 cm LA Vol (A2C): 37.7 ml 19.55 ml/m RA Volume:   35.60 ml  18.46 ml/m LA Vol (A4C): 45.0 ml 23.34 ml/m  AORTIC VALVE LVOT Vmax:   69.60 cm/s LVOT Vmean:  50.000 cm/s LVOT VTI:    0.138 m  AORTA Ao Root diam: 3.60 cm Ao Asc diam:  3.70 cm  SHUNTS Systemic VTI:  0.14 m Systemic Diam: 1.90 cm Oswaldo Milian MD Electronically signed by Oswaldo Milian MD Signature Date/Time: 10/20/2019/12:13:34 AM    Final     Patient Profile     79 y.o. male with past medical history of hypertension, Parkinson's, gastroesophageal reflux disease admitted with new onset atrial fibrillation.  Assessment & Plan    1. New onset atrial fibrillation-patient remains in atrial fibrillation this morning but is asymptomatic including no chest pain, dyspnea or palpitations.  Echocardiogram shows normal LV function.  TSH is normal.  I will discontinue IV Cardizem and begin Cardizem CD 180 mg daily.  Continue apixaban.  If heart rate is controlled later today he can be discharged and we will plan cardioversion in approximately 4 weeks.  If he does not hold sinus rhythm then rate control and anticoagulation would likely be best option as long as he remains asymptomatic.   2. Hypertension-blood pressure appears to be controlled with Cardizem alone.  Would continue Cardizem CD 180 mg daily.  Other home medications have been discontinued.  Follow blood pressure and adjust regimen as needed. 3. Parkinson's-Per primary care.  As outlined above if heart rate is controlled after Cardizem CD this morning patient can be discharged on Cardizem and apixaban.  We will arrange follow-up with APP in 2 to 3 weeks.  If he remains in atrial fibrillation we will arrange cardioversion in approximately 4 weeks.  He will  follow up with me in 12 weeks.  For questions or updates, please contact [place] Please consult [email or web address] for contact info under        Signed, Kirk Ruths, MD  10/20/2019, 8:00 AM

## 2019-10-20 NOTE — Plan of Care (Signed)

## 2019-10-20 NOTE — Discharge Summary (Signed)
Physician Discharge Summary  Needham Macgowan [id number] DOB: Jul 15, 1940 DOA: 10/19/2019  PCP: Reynold Bowen, MD  Admit date: 10/19/2019 Discharge date: 10/20/2019  Admitted From: Home Disposition: Home  Recommendations for Outpatient Follow-up:  1. Follow up with PCP in 1-2 weeks cardiology for cardioversion in 4 weeks, cardiology office visit in 12 weeks. 2. Please obtain BMP/CBC in one week 3. Continue all medications as below  Discharge Condition: Stable CODE STATUS: Full Diet recommendation: Cardiac prudent low-salt diet  Brief/Interim Summary: Michael Odonnell is a 79 y.o. male with medical history significant of hypertension, Parkinson's disease, and BPH presented with complaints of palpitations starting last night around 10:30 PM.  His wife checked his blood pressure which was elevated at the time his heart rates were noted to be into the 150s.  Noted associated symptoms of feeling uncomfortable and weak.  Denies any loss of consciousness or fall.  EMS was called at that time, but he declined transfer.  Then around 5 AM this morning patient reported symptoms of getting no better and he was having difficulty getting to the restroom.  EMS was again called and brought him to the hospital.  Wife notes that back in 2013 he had palpitation for which he was admitted into the hospital, and started on blood pressure medications at that time.  He underwent stress testing, but it was noted to be within normal limits.  They do not remember being told that he had atrial fibrillation at that time. Upon admission into the emergency department patient was noted to be in atrial fibrillation with heart rates into the 120s.  Labs significant for BNP 246.3 and troponin 10.  Chest x-ray was otherwise noted to be unremarkable.  Patient was given 10 mg of Cardizem IV and then started on a Cardizem drip.  TRH called to admit.  Cardiology evaluated patient, given asymptomatic A. fib with RVR well controlled on IV  Cardizem he was transitioned to p.o. Cardizem and IV was stopped.  Patient also placed on apixaban 5 mg twice daily given A. fib and elevated CHA2DS2-VASc.  DSH within normal limits, cardiology recommending cardioversion likely in 4 weeks, continued anticoagulation until that time.  House echocardiogram shows EF 65 to 70% without overt dysfunction. Patient at this time feels quite well, denies headache, fevers, chills, nausea, vomiting, shortness of breath, chest pain, palpitations.  Patient otherwise stable and agreeable for discharge home.  Discharge Diagnoses:  Principal Problem:   New onset atrial fibrillation (HCC) Active Problems:   Essential hypertension   Parkinson's disease (HCC)   BPH (benign prostatic hyperplasia)    Discharge Instructions  Discharge Instructions    Amb referral to [facility] Clinic   Complete by: As directed    Call MD for:  difficulty breathing, headache or visual disturbances   Complete by: As directed    Call MD for:  extreme fatigue   Complete by: As directed    Call MD for:  hives   Complete by: As directed    Call MD for:  persistant dizziness or light-headedness   Complete by: As directed    Call MD for:  persistant nausea and vomiting   Complete by: As directed    Call MD for:  severe uncontrolled pain   Complete by: As directed    Call MD for:  temperature >100.4   Complete by: As directed    Diet - low sodium heart healthy   Complete by: As directed    Increase activity slowly   Complete by:  As directed      Allergies as of 10/20/2019      Reactions   Codeine Nausea And Vomiting   Doxycycline Nausea Only   Metronidazole Nausea Only      Medication List    STOP taking these medications   amLODipine 5 MG tablet Commonly known as: NORVASC   metoprolol succinate 50 MG 24 hr tablet Commonly known as: TOPROL-XL     TAKE these medications   acetaminophen 500 MG tablet Commonly known as: TYLENOL Take 1,000 mg by mouth every 6 (six)  hours as needed for mild pain or headache.   apixaban 5 MG Tabs tablet Commonly known as: ELIQUIS Take 1 tablet (5 mg total) by mouth 2 (two) times daily.   carbidopa-levodopa 25-100 MG tablet Commonly known as: SINEMET IR TAKE 1 TABLET BY MOUTH 3  TIMES DAILY   Centrum Silver 50+Men Tabs Take by mouth daily.   diltiazem 180 MG 24 hr capsule Commonly known as: CARDIZEM CD Take 1 capsule (180 mg total) by mouth daily. Start taking on: Oct 21, 2019   magnesium 30 MG tablet Take 1 tablet (30 mg total) by mouth daily. What changed: how much to take   niacin 500 MG tablet Take 500 mg by mouth 2 (two) times daily.   perindopril 8 MG tablet Commonly known as: ACEON Take 8 mg by mouth daily.   polyethylene glycol 17 g packet Commonly known as: MIRALAX / GLYCOLAX Take 17 g by mouth daily as needed for mild constipation.   tamsulosin 0.4 MG Caps capsule Commonly known as: Flomax Take 1 capsule (0.4 mg total) by mouth daily.   TUMS PO Take 2 tablets by mouth daily as needed (heartburn).      Follow-up Information    Erlene Quan, PA-C Follow up on 11/10/2019.   Specialties: Cardiology, Radiology Why: Appointment at 9:45. Please arrive at 9:30 Contact information: [address] [place] [address] [phone]          Allergies  Allergen Reactions  . Codeine Nausea And Vomiting  . Doxycycline Nausea Only  . Metronidazole Nausea Only    Consultations:  [facility] cardiology   Procedures/Studies: DG Chest Portable 1 View  Result Date: 10/19/2019 CLINICAL DATA:  Shortness of breath EXAM: PORTABLE CHEST 1 VIEW COMPARISON:  None. FINDINGS: Probable calcified granuloma overlying the right lung base. No consolidation or edema. No pleural effusion or pneumothorax. Cardiomediastinal contours are within normal limits. IMPRESSION: No acute process in the chest. Electronically Signed   By: Macy Mis M.D.   On: 10/19/2019 07:37   ECHOCARDIOGRAM  COMPLETE  Result Date: 10/20/2019    ECHOCARDIOGRAM REPORT   Patient Name:   Michael Odonnell Date of Exam: 10/19/2019 Medical Rec #:  [id number]     Height:       69.0 in Accession #:    [id number]    Weight:       170.0 lb Date of Birth:  [birth year]     BSA:          1.928 m Patient Age:    46 years      BP:           112/69 mmHg Patient Gender: M             HR:           119 bpm. Exam Location:  Inpatient Procedure: 2D Echo, Cardiac Doppler, Color Doppler and Intracardiac  Opacification Agent Indications:    Atrial fibrillation  History:        Patient has no prior history of Echocardiogram examinations.  Sonographer:    Clayton Lefort RDCS (AE) Referring Phys: [id number] [facility] A SMITH  Sonographer Comments: No subcostal window. Poor Doppler angles for aortic valve in five chamber view with patient on back and LLD position. IMPRESSIONS  1. Left ventricular ejection fraction, by estimation, is 65 to 70%. The left ventricle has normal function. The left ventricle has no regional wall motion abnormalities. There is mild left ventricular hypertrophy. Left ventricular diastolic parameters are indeterminate.  2. Right ventricular systolic function is normal. The right ventricular size is normal. Tricuspid regurgitation signal is inadequate for assessing PA pressure.  3. The mitral valve is normal in structure. No evidence of mitral valve regurgitation.  4. The aortic valve was not well visualized. Aortic valve regurgitation is not visualized. No aortic stenosis is present.  5. Aortic dilatation noted. 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 FINDINGS  Left Ventricle: Left ventricular ejection fraction, by estimation, is 65 to 70%. The left ventricle has normal function. The left ventricle has no regional wall motion abnormalities. Definity contrast agent was given IV to delineate the left ventricular  endocardial borders. The left ventricular internal cavity size was normal in size. There is  mild left ventricular hypertrophy. Left ventricular diastolic parameters are indeterminate. Right Ventricle: The right ventricular size is normal. Right vetricular wall thickness was not assessed. Right ventricular systolic function is normal. Tricuspid regurgitation signal is inadequate for assessing PA pressure. Left Atrium: Left atrial size was normal in size. Right Atrium: Right atrial size was normal in size. Pericardium: A small pericardial effusion is present. Presence of pericardial fat pad. Mitral Valve: The mitral valve is normal in structure. Moderate mitral annular calcification. No evidence of mitral valve regurgitation. Tricuspid Valve: The tricuspid valve is normal in structure. Tricuspid valve regurgitation is not demonstrated. Aortic Valve: The aortic valve was not well visualized. Aortic valve regurgitation is not visualized. No aortic stenosis is present. Pulmonic Valve: The pulmonic valve was not well visualized. Pulmonic valve regurgitation is not visualized. Aorta: Aortic dilatation noted. 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 IAS/Shunts: The interatrial septum was not well visualized.  LEFT VENTRICLE PLAX 2D LVIDd:         3.90 cm LVIDs:         2.50 cm LV PW:         1.00 cm LV IVS:        1.10 cm LVOT diam:     1.90 cm LV SV:         39 LV SV Index:   20 LVOT Area:     2.84 cm  RIGHT VENTRICLE RV Basal diam:  3.30 cm RV S prime:     15.30 cm/s TAPSE (M-mode): 2.1 cm LEFT ATRIUM           Index       RIGHT ATRIUM           Index LA diam:      3.00 cm 1.56 cm/m  RA Area:     14.10 cm LA Vol (A2C): 37.7 ml 19.55 ml/m RA Volume:   35.60 ml  18.46 ml/m LA Vol (A4C): 45.0 ml 23.34 ml/m  AORTIC VALVE LVOT Vmax:   69.60 cm/s LVOT Vmean:  50.000 cm/s LVOT VTI:    0.138 m  AORTA Ao Root  diam: 3.60 cm Ao Asc diam:  3.70 cm  SHUNTS Systemic VTI:  0.14 m Systemic Diam: 1.90 cm Oswaldo Milian MD Electronically signed by Oswaldo Milian MD Signature Date/Time:  10/20/2019/12:13:34 AM    Final      Subjective: No acute issues or events overnight, denies chest pain, palpitations, shortness of breath, nausea, vomiting, diarrhea, constipation, headache, fevers, chills.   Discharge Exam: Vitals:   10/20/19 0355 10/20/19 0738  BP: (!) 164/96 127/86  Pulse: 96 97  Resp: 20 19  Temp: 97.9 F (36.6 C) 98.2 F (36.8 C)  SpO2: 99% 98%   Vitals:   10/20/19 0349 10/20/19 0351 10/20/19 0355 10/20/19 0738  BP: (!) 146/127  (!) 164/96 127/86  Pulse: 80 (!) 131 96 97  Resp: 20  20 19   Temp: 97.7 F (36.5 C)  97.9 F (36.6 C) 98.2 F (36.8 C)  TempSrc: Oral  Oral Oral  SpO2: 97% 98% 99% 98%  Weight:   74.9 kg   Height:        General: Pt is alert, awake, not in acute distress Cardiovascular: RRR, S1/S2 +, no rubs, no gallops Respiratory: CTA bilaterally, no wheezing, no rhonchi Abdominal: Soft, NT, ND, bowel sounds + Extremities: no edema, no cyanosis    The results of significant diagnostics from this hospitalization (including imaging, microbiology, ancillary and laboratory) are listed below for reference.     Microbiology: Recent Results (from the past 240 hour(s))  SARS Coronavirus 2 by RT PCR (hospital order, performed in [facility] hospital lab) Nasopharyngeal Nasopharyngeal Swab     Status: None   Collection Time: 10/19/19  8:50 AM   Specimen: Nasopharyngeal Swab  Result Value Ref Range Status   SARS Coronavirus 2 NEGATIVE NEGATIVE Final    Comment: (NOTE) SARS-CoV-2 target nucleic acids are NOT DETECTED. The SARS-CoV-2 RNA is generally detectable in upper and lower respiratory specimens during the acute phase of infection. The lowest concentration of SARS-CoV-2 viral copies this assay can detect is 250 copies / mL. A negative result does not preclude SARS-CoV-2 infection and should not be used as the sole basis for treatment or other patient management decisions.  A negative result may occur with improper specimen collection  / handling, submission of specimen other than nasopharyngeal swab, presence of viral mutation(s) within the areas targeted by this assay, and inadequate number of viral copies (<250 copies / mL). A negative result must be combined with clinical observations, patient history, and epidemiological information. Fact Sheet for Patients:   StrictlyIdeas.no Fact Sheet for Healthcare Providers: BankingDealers.co.za This test is not yet approved or cleared  by the [place] FDA and has been authorized for detection and/or diagnosis of SARS-CoV-2 by FDA under an Emergency Use Authorization (EUA).  This EUA will remain in effect (meaning this test can be used) for the duration of the COVID-19 declaration under Section 564(b)(1) of the Act, 21 U.S.C. section 360bbb-3(b)(1), unless the authorization is terminated or revoked sooner. Performed at [facility], [place] [address] [place], [place] [address]      Labs: BNP (last 3 results) Recent Labs    10/19/19 0720  BNP [id number]*   Basic Metabolic Panel: Recent Labs  Lab 10/19/19 0720 10/20/19 0304  NA 140 141  K 3.9 4.0  CL 102 103  CO2 24 28  GLUCOSE 138* 118*  BUN 23 21  CREATININE 1.20 1.24  CALCIUM 9.9 9.1  MG 2.2  --    Liver Function Tests: No results for  input(s): AST, ALT, ALKPHOS, BILITOT, PROT, ALBUMIN in the last 168 hours. No results for input(s): LIPASE, AMYLASE in the last 168 hours. No results for input(s): AMMONIA in the last 168 hours. CBC: Recent Labs  Lab 10/19/19 0720 10/20/19 0304  WBC 9.6 10.3  NEUTROABS 7.8*  --   HGB 15.1 14.3  HCT 46.4 43.4  MCV 91.3 91.8  PLT 199 208   Cardiac Enzymes: No results for input(s): CKTOTAL, CKMB, CKMBINDEX, TROPONINI in the last 168 hours. BNP: Invalid input(s): POCBNP CBG: No results for input(s): GLUCAP in the last 168 hours. D-Dimer No results for input(s): DDIMER in the last 72 hours. Hgb A1c No results  for input(s): HGBA1C in the last 72 hours. Lipid Profile No results for input(s): CHOL, HDL, LDLCALC, TRIG, CHOLHDL, LDLDIRECT in the last 72 hours. Thyroid function studies Recent Labs    10/19/19 0720  TSH 1.031   Anemia work up No results for input(s): VITAMINB12, FOLATE, FERRITIN, TIBC, IRON, RETICCTPCT in the last 72 hours. Urinalysis    Component Value Date/Time   COLORURINE STRAW (A) 12/22/2016 1557   APPEARANCEUR CLEAR 12/22/2016 1557   LABSPEC 1.005 12/22/2016 1557   PHURINE 6.0 12/22/2016 1557   GLUCOSEU NEGATIVE 12/22/2016 1557   HGBUR SMALL (A) 12/22/2016 1557   BILIRUBINUR NEGATIVE 12/22/2016 1557   KETONESUR NEGATIVE 12/22/2016 1557   PROTEINUR NEGATIVE 12/22/2016 1557   NITRITE NEGATIVE 12/22/2016 1557   LEUKOCYTESUR NEGATIVE 12/22/2016 1557   Sepsis Labs Invalid input(s): PROCALCITONIN,  WBC,  LACTICIDVEN Microbiology Recent Results (from the past 240 hour(s))  SARS Coronavirus 2 by RT PCR (hospital order, performed in [facility] hospital lab) Nasopharyngeal Nasopharyngeal Swab     Status: None   Collection Time: 10/19/19  8:50 AM   Specimen: Nasopharyngeal Swab  Result Value Ref Range Status   SARS Coronavirus 2 NEGATIVE NEGATIVE Final    Comment: (NOTE) SARS-CoV-2 target nucleic acids are NOT DETECTED. The SARS-CoV-2 RNA is generally detectable in upper and lower respiratory specimens during the acute phase of infection. The lowest concentration of SARS-CoV-2 viral copies this assay can detect is 250 copies / mL. A negative result does not preclude SARS-CoV-2 infection and should not be used as the sole basis for treatment or other patient management decisions.  A negative result may occur with improper specimen collection / handling, submission of specimen other than nasopharyngeal swab, presence of viral mutation(s) within the areas targeted by this assay, and inadequate number of viral copies (<250 copies / mL). A negative result must be combined  with clinical observations, patient history, and epidemiological information. Fact Sheet for Patients:   StrictlyIdeas.no Fact Sheet for Healthcare Providers: BankingDealers.co.za This test is not yet approved or cleared  by the [place] FDA and has been authorized for detection and/or diagnosis of SARS-CoV-2 by FDA under an Emergency Use Authorization (EUA).  This EUA will remain in effect (meaning this test can be used) for the duration of the COVID-19 declaration under Section 564(b)(1) of the Act, 21 U.S.C. section 360bbb-3(b)(1), unless the authorization is terminated or revoked sooner. Performed at [facility], [place] [address] [place], [place] [address]      Time coordinating discharge: Over 30 minutes  SIGNED:   Little Ishikawa, DO [facility] 10/20/2019, 2:35 PM Pager   If 7PM-7AM, please contact night-coverage [email or web address]

## 2019-10-20 NOTE — Progress Notes (Signed)
Pt walked in hallway again with NT around 2:00-2:05 time, HR 100-110's while walking, HR 80-90's at rest in his room. Pt denied any SOB or CP while walking.

## 2019-10-27 DIAGNOSIS — E7849 Other hyperlipidemia: Secondary | ICD-10-CM | POA: Diagnosis not present

## 2019-10-27 DIAGNOSIS — R7309 Other abnormal glucose: Secondary | ICD-10-CM | POA: Diagnosis not present

## 2019-10-30 ENCOUNTER — Emergency Department (HOSPITAL_COMMUNITY)
Admission: EM | Admit: 2019-10-30 | Discharge: 2019-10-30 | Disposition: A | Discharge status: Home / Self Care | Payer: Medicare Other | Attending: Emergency Medicine | Admitting: Emergency Medicine

## 2019-10-30 ENCOUNTER — Other Ambulatory Visit: Payer: Self-pay

## 2019-10-30 ENCOUNTER — Encounter (HOSPITAL_COMMUNITY): Payer: Self-pay | Admitting: Emergency Medicine

## 2019-10-30 ENCOUNTER — Emergency Department (HOSPITAL_COMMUNITY): Payer: Medicare Other

## 2019-10-30 DIAGNOSIS — I10 Essential (primary) hypertension: Secondary | ICD-10-CM | POA: Insufficient documentation

## 2019-10-30 DIAGNOSIS — G20 Parkinson's disease: Secondary | ICD-10-CM | POA: Insufficient documentation

## 2019-10-30 DIAGNOSIS — R002 Palpitations: Secondary | ICD-10-CM | POA: Diagnosis not present

## 2019-10-30 DIAGNOSIS — Z85828 Personal history of other malignant neoplasm of skin: Secondary | ICD-10-CM | POA: Diagnosis not present

## 2019-10-30 DIAGNOSIS — Z87891 Personal history of nicotine dependence: Secondary | ICD-10-CM | POA: Insufficient documentation

## 2019-10-30 DIAGNOSIS — I4891 Unspecified atrial fibrillation: Secondary | ICD-10-CM | POA: Diagnosis not present

## 2019-10-30 DIAGNOSIS — J9811 Atelectasis: Secondary | ICD-10-CM | POA: Diagnosis not present

## 2019-10-30 DIAGNOSIS — R000 Tachycardia, unspecified: Secondary | ICD-10-CM | POA: Diagnosis not present

## 2019-10-30 LAB — MAGNESIUM: Magnesium: 2 mg/dL (ref 1.7–2.4)

## 2019-10-30 LAB — COMPREHENSIVE METABOLIC PANEL
ALT: 18 U/L (ref 0–44)
AST: 21 U/L (ref 15–41)
Albumin: 3.8 g/dL (ref 3.5–5.0)
Alkaline Phosphatase: 64 U/L (ref 38–126)
Anion gap: 11 (ref 5–15)
BUN: 18 mg/dL (ref 8–23)
CO2: 27 mmol/L (ref 22–32)
Calcium: 9.5 mg/dL (ref 8.9–10.3)
Chloride: 103 mmol/L (ref 98–111)
Creatinine, Ser: 1.16 mg/dL (ref 0.61–1.24)
GFR calc Af Amer: >60 mL/min (ref >60)
GFR calc non Af Amer: 59 mL/min — ABNORMAL LOW (ref >60)
Glucose, Bld: 130 mg/dL — ABNORMAL HIGH (ref 70–99)
Potassium: 3.8 mmol/L (ref 3.5–5.1)
Sodium: 141 mmol/L (ref 135–145)
Total Bilirubin: 1.3 mg/dL — ABNORMAL HIGH (ref 0.3–1.2)
Total Protein: 6.6 g/dL (ref 6.5–8.1)

## 2019-10-30 LAB — CBC
HCT: 42 % (ref 39.0–52.0)
Hemoglobin: 13.8 g/dL (ref 13.0–17.0)
MCH: 30.1 pg (ref 26.0–34.0)
MCHC: 32.9 g/dL (ref 30.0–36.0)
MCV: 91.7 fL (ref 80.0–100.0)
Platelets: 209 10*3/uL (ref 150–400)
RBC: 4.58 MIL/uL (ref 4.22–5.81)
RDW: 12.9 % (ref 11.5–15.5)
WBC: 8.6 10*3/uL (ref 4.0–10.5)
nRBC: 0 % (ref 0.0–0.2)

## 2019-10-30 LAB — PROTIME-INR
INR: 1.2 (ref 0.8–1.2)
Prothrombin Time: 14.8 seconds (ref 11.4–15.2)

## 2019-10-30 LAB — APTT: aPTT: 34 seconds (ref 24–36)

## 2019-10-30 MED ORDER — CARBIDOPA-LEVODOPA 25-100 MG PO TABS
1.0000 | ORAL_TABLET | Freq: Once | ORAL | Status: AC
  Administered 2019-10-30: 1 via ORAL
  Filled 2019-10-30: qty 1

## 2019-10-30 MED ORDER — DILTIAZEM HCL ER BEADS 360 MG PO CP24: 360.0000 mg | ORAL_CAPSULE | Freq: Every day | ORAL | 1 refills | Status: DC

## 2019-10-30 MED ORDER — DILTIAZEM HCL 25 MG/5ML IV SOLN
10.0000 mg | Freq: Once | INTRAVENOUS | Status: DC
  Filled 2019-10-30: qty 5

## 2019-10-30 NOTE — ED Notes (Signed)
[place] Wife would like an update

## 2019-10-30 NOTE — ED Provider Notes (Signed)
[facility] Emergency Department Provider Note MRN:  [id number]  Arrival date & time: 10/30/19     Chief Complaint   Atrial Fibrillation   History of Present Illness   Michael Odonnell is a 79 y.o. year-old male with a history of Parkinson's disease, A. fib presenting to the ED with chief complaint of A. fib.  Shortly prior to arrival patient began experiencing palpitations, described as "my heart beating out of my chest".  Denies any chest pain or pressure.  No shortness of breath, no headache or vision change, no abdominal pain, no recent illness or fever or cough, no vomiting or diarrhea.  Review of Systems  A complete 10 system review of systems was obtained and all systems are negative except as noted in the HPI and PMH.   Patient's Health History    Past Medical History:  Diagnosis Date  . Arthritis   . Colon polyps   . Degenerative lumbar disc   . Diverticulosis   . GERD (gastroesophageal reflux disease)   . Hypertension   . Parkinson's disease ([place])   . Sciatica   . 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 Tremor     Past Surgical History:  Procedure Laterality Date  . CLEFT LIP REPAIR    . CLEFT PALATE REPAIR    . SKIN GRAFT FULL THICKNESS LEG  12/2018    Family History  Problem Relation Age of Onset  . Heart attack Mother 53  . Heart disease Sister   . Melanoma Sister   . Heart attack Brother   . Healthy Daughter   . Healthy Daughter   . Stomach cancer Neg Hx   . Colon cancer Neg Hx     Social History   Socioeconomic History  . Marital status: Married    Spouse name: Not on file  . Number of children: 2  . Years of education: Not on file  . Highest education level: Bachelor's degree (e.g., BA, AB, BS)  Occupational History  . 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 Smoking status: Former Smoker    Types: Cigarettes    Quit date: 12/20/2012   Years since quitting: 6.8  . Smokeless tobacco: Never Used  Substance and Sexual Activity  . Alcohol use: No  . Drug use: No  . Sexual activity: Not Currently  Other Topics Concern  . Not on file  Social History Narrative  . Not on file   Social Determinants of Health   Financial Resource Strain:   . Difficulty of Paying Living Expenses:   Food Insecurity:   . Worried About Charity fundraiser in the Last Year:   . Arboriculturist in the Last Year:   Transportation Needs:   . Film/video editor (Medical):   Marland Kitchen Lack of Transportation (Non-Medical):   Physical Activity:   . Days of Exercise per Week:   . Minutes of Exercise per Session:   Stress:   . Feeling of Stress :   Social Connections:   . Frequency of Communication with Friends and Family:   . Frequency of Social Gatherings with Friends and Family:   . Attends Religious Services:   . Active Member of Clubs or Organizations:   . Attends Archivist Meetings:   Marland Kitchen Marital Status:   Intimate Partner Violence:   . Fear of Current or Ex-Partner:   .  Emotionally Abused:   Marland Kitchen Physically Abused:   . Sexually Abused:      Physical Exam   Vitals:   10/30/19 1024  BP: (!) 168/94  Pulse: (!) 115  Resp: 20  Temp: 97.8 F (36.6 C)  SpO2: 98%    CONSTITUTIONAL: Well-appearing, NAD NEURO:  Alert and oriented x 3, no focal deficits EYES:  eyes equal and reactive ENT/NECK:  no LAD, no JVD CARDIO: Tachycardic and irregular rate, well-perfused, normal S1 and S2 PULM:  CTAB no wheezing or rhonchi GI/GU:  normal bowel sounds, non-distended, non-tender MSK/SPINE:  No gross deformities, no edema SKIN:  no rash, atraumatic PSYCH:  Appropriate speech and behavior  *Additional and/or pertinent findings included in MDM below  Diagnostic and Interventional Summary    EKG Interpretation  Date/Time:  Friday Oct 30 2019 10:18:52 EDT Ventricular Rate:  133 PR Interval:    QRS Duration: 81 QT Interval:  305 QTC  Calculation: 449 R Axis:   -74 Text Interpretation: Atrial fibrillation Multiple ventricular premature complexes Abnormal R-wave progression, late transition Inferior infarct, old Confirmed by Gerlene Fee [phone]) on 10/30/2019 10:33:40 AM       EKG Interpretation  Date/Time:  Friday Oct 30 2019 11: 35: 19 EDT Ventricular Rate:  95 PR Interval:    QRS Duration: 80 QT Interval:   QTC Calculation:  R Axis:    Text Interpretation: Sinus rhythm Confirmed by Dr. Gerlene Fee at 11:36 AM      Labs Reviewed  COMPREHENSIVE METABOLIC PANEL - Abnormal; Notable for the following components:      Result Value   Glucose, Bld 130 (*)    Total Bilirubin 1.3 (*)    GFR calc non Af Amer 59 (*)    All other components within normal limits  CBC  MAGNESIUM  PROTIME-INR  APTT    DG Chest Port 1 View  Final Result      Medications  diltiazem (CARDIZEM) injection 10 mg (10 mg Intravenous Not Given 10/30/19 1151)  carbidopa-levodopa (SINEMET IR) 25-100 MG per tablet immediate release 1 tablet (has no administration in time range)     Procedures  /  Critical Care .Critical Care Performed by: Maudie Flakes, MD Authorized by: Maudie Flakes, MD   Critical care provider statement:    Critical care time (minutes):  35   Critical care was necessary to treat or prevent imminent or life-threatening deterioration of the following conditions: Atrial fibrillation with rapid ventricular response.   Critical care was time spent personally by me on the following activities:  Discussions with consultants, evaluation of patient's response to treatment, examination of patient, ordering and performing treatments and interventions, ordering and review of laboratory studies, ordering and review of radiographic studies, pulse oximetry, re-evaluation of patient's condition, obtaining history from patient or surrogate and review of old charts .1-3 Lead EKG Interpretation Performed by: Maudie Flakes,  MD Authorized by: Maudie Flakes, MD     Interpretation: abnormal     ECG rate:  120   ECG rate assessment: tachycardic     Rhythm: ventricular fibrillation   Comments:     Cardiac monitoring was ordered to monitor the patient for dysrhythmia.  I personally interpreted the patient's cardiac monitor while at the bedside.     ED Course and Medical Decision Making  I have reviewed the triage vital signs, the nursing notes, and pertinent available records from the EMR.  Listed above are laboratory and imaging tests that I personally ordered,  reviewed, and interpreted and then considered in my medical decision making (see below for details).      New onset A. fib 10 days ago, was admitted, started on Eliquis, per cardiology notes they plan to perform cardioversion after 4 weeks of anticoagulation.  They had considered transesophageal echo during the last admission but decided against it, will reconsult cardiology to see if they would like to proceed with this intervention given the recurrence of RVR.  Hemodynamically stable, well-appearing, no chest pain.  Given 10 mg diltiazem by EMS, rates were initially 170, rates into the ED ranged between 120s to 130s here, providing 10 mg additional diltiazem.  Prior to giving any further diltiazem here in the emergency department, patient spontaneously converted to sinus rhythm with rate 80s to 90s.  He is symptom-free, sitting in a bedside chair, requesting food, home meds, also requesting discharge if possible.  Discussed case with Dr. Marlou Porch, who recommends increasing home diltiazem dose from 180 to 360 mg, appropriate for discharge with close cardiology follow-up.  Barth Kirks. Sedonia Small, [place] [email or web address]  Final Clinical Impressions(s) / ED Diagnoses     ICD-10-CM   1. Atrial fibrillation with RVR (HCC)  I48.91     ED Discharge Orders         Ordered    diltiazem (TIAZAC) 360 MG 24  hr capsule  Daily     10/30/19 1346           Discharge Instructions Discussed with and Provided to Patient:     Discharge Instructions     You were evaluated in the Emergency Department and after careful evaluation, we did not find any emergent condition requiring admission or further testing in the hospital.  Your exam/testing today is overall reassuring.  Your abnormal heart rhythm seems to have resolved and now you are in a normal rhythm.  It is important that you continue to take your blood thinner.  We discussed your case with the cardiologist, who recommend increasing your diltiazem dose from 180 mg to 360 mg.  Please keep your follow-up with your cardiologist.  Please return to the Emergency Department if you experience any worsening of your condition.  We encourage you to follow up with a primary care provider.  Thank you for allowing [place] to be a part of your care.       Maudie Flakes, MD 10/30/19 1350

## 2019-10-30 NOTE — ED Triage Notes (Signed)
To ED via [facility] from home, with c/o palpitations- when EMS arrived, pt was in AFib RVR with rate of 190- received CARDIZEM 10mg  IV per ems, bringing rate to 120s.  Was here last week with same- was admitted.  HX of parkinsons, with tremors. Does take levadopa TID before each meal

## 2019-10-30 NOTE — Discharge Instructions (Addendum)
You were evaluated in the Emergency Department and after careful evaluation, we did not find any emergent condition requiring admission or further testing in the hospital.  Your exam/testing today is overall reassuring.  Your abnormal heart rhythm seems to have resolved and now you are in a normal rhythm.  It is important that you continue to take your blood thinner.  We discussed your case with the cardiologist, who recommend increasing your diltiazem dose from 180 mg to 360 mg.  Please keep your follow-up with your cardiologist.  Please return to the Emergency Department if you experience any worsening of your condition.  We encourage you to follow up with a primary care provider.  Thank you for allowing [place] to be a part of your care.

## 2019-11-03 DIAGNOSIS — I10 Essential (primary) hypertension: Secondary | ICD-10-CM | POA: Diagnosis not present

## 2019-11-03 DIAGNOSIS — N1831 Chronic kidney disease, stage 3a: Secondary | ICD-10-CM | POA: Diagnosis present

## 2019-11-03 DIAGNOSIS — I700 Atherosclerosis of aorta: Secondary | ICD-10-CM | POA: Diagnosis not present

## 2019-11-03 DIAGNOSIS — G20 Parkinson's disease: Secondary | ICD-10-CM | POA: Diagnosis not present

## 2019-11-03 DIAGNOSIS — M545 Low back pain: Secondary | ICD-10-CM | POA: Diagnosis not present

## 2019-11-03 DIAGNOSIS — I77819 Aortic ectasia, unspecified site: Secondary | ICD-10-CM | POA: Diagnosis not present

## 2019-11-03 DIAGNOSIS — Z0000 Encounter for general adult medical examination without abnormal findings: Secondary | ICD-10-CM | POA: Diagnosis not present

## 2019-11-03 DIAGNOSIS — R0602 Shortness of breath: Secondary | ICD-10-CM | POA: Diagnosis not present

## 2019-11-03 DIAGNOSIS — R7309 Other abnormal glucose: Secondary | ICD-10-CM | POA: Diagnosis not present

## 2019-11-03 DIAGNOSIS — N401 Enlarged prostate with lower urinary tract symptoms: Secondary | ICD-10-CM | POA: Diagnosis not present

## 2019-11-03 DIAGNOSIS — Z1331 Encounter for screening for depression: Secondary | ICD-10-CM | POA: Diagnosis not present

## 2019-11-03 DIAGNOSIS — I4891 Unspecified atrial fibrillation: Secondary | ICD-10-CM | POA: Diagnosis not present

## 2019-11-03 DIAGNOSIS — R82998 Other abnormal findings in urine: Secondary | ICD-10-CM | POA: Diagnosis not present

## 2019-11-03 DIAGNOSIS — K317 Polyp of stomach and duodenum: Secondary | ICD-10-CM | POA: Diagnosis not present

## 2019-11-05 ENCOUNTER — Telehealth: Payer: Self-pay | Admitting: Cardiology

## 2019-11-05 NOTE — Telephone Encounter (Signed)
Wife of the patient called. The wife would like permission to come with the patient to his upcoming appt 11/10/19. The patient has Parkinson's and she would need to assist him. Please let the wife know what the office decides

## 2019-11-05 NOTE — Telephone Encounter (Signed)
Left message for patient's wife that it will be okay for her to come with the patient to his appointment

## 2019-11-05 NOTE — Progress Notes (Signed)
Assessment/Plan:   1.  Parkinsonism  -Atypical state has been in the differential for a long time, but we have not really seen a significant deterioration.  MND has also been in the differential, but again, there has been lack of significant deterioration and patient declines EMG.  Patient also thinks that levodopa has been very helpful.  -increase carbidopa/levodopa 25/100, 6am/10am/2pm/6pm  -We discussed that it used to be thought that levodopa would increase risk of melanoma but now it is believed that Parkinsons itself likely increases risk of melanoma. he is to get regular skin checks.  He does follow with dermatology.  2.  PAF  -in sinus now.  On cardizem.    -on eliquis  3.  Vasomotor rhinitis  -discussed nature and pathophysiology  4.  Sialorrhea  -This is commonly associated with PD.  We talked about treatments.  The patient is not a candidate for oral anticholinergic therapy because of increased risk of confusion and falls.  We discussed Botox (type A and B) and 1% atropine drops.  We discusssed that candy like lemon drops can help by stimulating mm of the oropharynx to induce swallowing.  He will try that method first  5.  SOB  -pts PCP working it up and considering referral to pulmonary.  Discussed not associated with Parkinsons Disease.    6.  Follow up is anticipated in the next 4-6 months, sooner should new neurologic issues arise.     Subjective:   Michael Odonnell was seen today in follow up for parkinsonism.  My previous records were reviewed prior to todays visit as well as outside records available to me. Pt denies falls.  Pt denies lightheadedness, near syncope.  No hallucinations.  Mood has been good.  Medical records are reviewed.  Patient was in the hospital in May for atrial fibrillation with rapid ventricular response, new onset.  He was started on Eliquis.  He returned 10 days later with symptoms of A. fib again, but spontaneously converted to sinus.  His  diltiazem was increased.  Doing well ever since.  He had an appointment this the cardiology PA this morning.  Current prescribed movement disorder medications: Carbidopa/levodopa 25/100, 1 tablet 3 times per day (6am/noon/6pm)   ALLERGIES:   Allergies  Allergen Reactions  . Codeine Nausea And Vomiting  . Doxycycline Nausea Only  . Metronidazole Nausea Only    CURRENT MEDICATIONS:  Outpatient Encounter Medications as of 11/10/2019  Medication Sig  . acetaminophen (TYLENOL) 500 MG tablet Take 1,000 mg by mouth every 6 (six) hours as needed for mild pain or headache.   Marland Kitchen apixaban (ELIQUIS) 5 MG TABS tablet Take 1 tablet (5 mg total) by mouth 2 (two) times daily.  . Calcium Carbonate Antacid (TUMS PO) Take 2 tablets by mouth daily as needed (heartburn).   . carbidopa-levodopa (SINEMET IR) 25-100 MG tablet TAKE 1 TABLET BY MOUTH 3  TIMES DAILY (Patient taking differently: Take 1 tablet by mouth 3 (three) times daily. )  . diltiazem (TIAZAC) 360 MG 24 hr capsule Take 1 capsule (360 mg total) by mouth daily.  . Magnesium Oxide 400 (240 Mg) MG TABS Take 1 tablet (400 mg total) by mouth daily.  . Multiple Vitamins-Minerals (CENTRUM SILVER 50+MEN) TABS Take by mouth daily.  . niacin 500 MG tablet Take 500 mg by mouth 2 (two) times daily.  . perindopril (ACEON) 8 MG tablet Take 8 mg by mouth daily.  . polyethylene glycol (MIRALAX / GLYCOLAX) packet Take 17 g  by mouth daily as needed for mild constipation.   . tamsulosin (FLOMAX) 0.4 MG CAPS capsule Take 1 capsule (0.4 mg total) by mouth daily.  . [DISCONTINUED] apixaban (ELIQUIS) 5 MG TABS tablet Take 1 tablet (5 mg total) by mouth 2 (two) times daily.  . [DISCONTINUED] diltiazem (CARDIZEM CD) 180 MG 24 hr capsule Take 1 capsule (180 mg total) by mouth daily.  . [DISCONTINUED] diltiazem (TIAZAC) 360 MG 24 hr capsule Take 1 capsule (360 mg total) by mouth daily.   No facility-administered encounter medications on file as of 11/10/2019.     Objective:   PHYSICAL EXAMINATION:    VITALS:   Vitals:   11/10/19 1319  BP: (!) 127/58  Pulse: 73  SpO2: 96%  Weight: 168 lb (76.2 kg)  Height: 5\' 9"  (1.753 m)    GEN:  The patient appears stated age and is in NAD. HEENT:  Normocephalic, atraumatic.  The mucous membranes are moist. The superficial temporal arteries are without ropiness or tenderness. CV:  RRR Lungs:  CTAB Neck/HEME:  There are no carotid bruits bilaterally.  Neurological examination:  Orientation: The patient is alert and oriented x3. Cranial nerves: There is good facial symmetry with facial hypomimia. The speech is fluent and pseudobulbar. Soft palate rises symmetrically and there is no tongue deviation. Hearing is intact to conversational tone. Sensation: Sensation is intact to light touch throughout Motor: Strength is at least antigravity x4.  Movement examination: Tone: There is normal tone in the UE/LE Abnormal movements: there is bilateral UE rest tremor Coordination:  There is minimal to no decremation with RAM's Gait and Station: The patient has no difficulty arising out of a deep-seated chair without the use of the hands. The patient's stride length is good but slow.    I have reviewed and interpreted the following labs independently    Chemistry      Component Value Date/Time   NA 141 10/30/2019 1212   K 3.8 10/30/2019 1212   CL 103 10/30/2019 1212   CO2 27 10/30/2019 1212   BUN 18 10/30/2019 1212   CREATININE 1.16 10/30/2019 1212      Component Value Date/Time   CALCIUM 9.5 10/30/2019 1212   ALKPHOS 64 10/30/2019 1212   AST 21 10/30/2019 1212   ALT 18 10/30/2019 1212   BILITOT 1.3 (H) 10/30/2019 1212       Lab Results  Component Value Date   WBC 8.6 10/30/2019   HGB 13.8 10/30/2019   HCT 42.0 10/30/2019   MCV 91.7 10/30/2019   PLT 209 10/30/2019    Lab Results  Component Value Date   TSH 1.031 10/19/2019   Lab Results  Component Value Date   VITAMINB12 685  12/20/2016     Total time spent on today's visit was 40 minutes, including both face-to-face time and nonface-to-face time.  Time included that spent on review of records (prior notes available to me/labs/imaging if pertinent), discussing treatment and goals, answering patient's questions and coordinating care.  Cc:  Reynold Bowen, MD

## 2019-11-10 ENCOUNTER — Other Ambulatory Visit: Payer: Self-pay

## 2019-11-10 ENCOUNTER — Encounter: Payer: Self-pay | Admitting: Neurology

## 2019-11-10 ENCOUNTER — Ambulatory Visit (INDEPENDENT_AMBULATORY_CARE_PROVIDER_SITE_OTHER): Payer: Medicare Other | Admitting: Cardiology

## 2019-11-10 ENCOUNTER — Ambulatory Visit (INDEPENDENT_AMBULATORY_CARE_PROVIDER_SITE_OTHER): Payer: Medicare Other | Admitting: Neurology

## 2019-11-10 VITALS — BP 130/62 | HR 78 | Ht 69.0 in | Wt 167.8 lb

## 2019-11-10 VITALS — BP 127/58 | HR 73 | Ht 69.0 in | Wt 168.0 lb

## 2019-11-10 DIAGNOSIS — G20 Parkinson's disease: Secondary | ICD-10-CM

## 2019-11-10 DIAGNOSIS — I4891 Unspecified atrial fibrillation: Secondary | ICD-10-CM | POA: Diagnosis not present

## 2019-11-10 DIAGNOSIS — I10 Essential (primary) hypertension: Secondary | ICD-10-CM | POA: Diagnosis not present

## 2019-11-10 DIAGNOSIS — Z7901 Long term (current) use of anticoagulants: Secondary | ICD-10-CM

## 2019-11-10 MED ORDER — APIXABAN 5 MG PO TABS: 5.0000 mg | ORAL_TABLET | Freq: Two times a day (BID) | ORAL | 1 refills | Status: DC

## 2019-11-10 MED ORDER — CARBIDOPA-LEVODOPA 25-100 MG PO TABS: 1.0000 | ORAL_TABLET | Freq: Four times a day (QID) | ORAL | 1 refills | Status: DC

## 2019-11-10 MED ORDER — DILTIAZEM HCL ER BEADS 360 MG PO CP24: 360.0000 mg | ORAL_CAPSULE | Freq: Every day | ORAL | 3 refills | Status: DC

## 2019-11-10 NOTE — Assessment & Plan Note (Signed)
Significant tremor

## 2019-11-10 NOTE — Progress Notes (Signed)
Cardiology Office Note:    Date:  11/10/2019   ID:  Ara Kussmaul, DOB 10/02/1940, MRN [id number]  PCP:  Reynold Bowen, MD  Cardiologist:  Dr Stanford Breed Electrophysiologist:  None   Referring MD: Reynold Bowen, MD   No chief complaint on file.   History of Present Illness:    Michael Odonnell is a 79 y.o. male with a hx of Parkinson's and HTN who presented to the emergency room 10/19/2019 with palpitations.  He was found to be in atrial fibrillation.  He was admitted to telemetry and diltiazem was added for rate control.  Once his rate was controlled he was asymptomatic.  Echocardiogram showed an ejection fraction of 65 to 70% with mild LVH and normal left atriul size.  CHADS VASC=3 for HTN and age and he was placed on Eliquis 5 mg BID. He was discharged with plans for outpatient cardioversion.   Before this could be done he presented again to the emergency room 10/30/2019 with recurrent symptomatic rapid atrial fibrillation.  Diltiazem was increased to Tiazac 360 mg.  The patient converted spontaneously to sinus rhythm and was discharged from the emergency room.  He seen in the office today accompanied by his wife for follow-up.  His EKG shows normal sinus rhythm.  He remains asymptomatic.  Past Medical History:  Diagnosis Date  . Arthritis   . Colon polyps   . Degenerative lumbar disc   . Diverticulosis   . GERD (gastroesophageal reflux disease)   . Hypertension   . Parkinson's disease ([place])   . Sciatica   . 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 Tremor     Past Surgical History:  Procedure Laterality Date  . CLEFT LIP REPAIR    . CLEFT PALATE REPAIR    . SKIN GRAFT FULL THICKNESS LEG  12/2018    Current Medications: Current Meds  Medication Sig  . acetaminophen (TYLENOL) 500 MG tablet Take 1,000 mg by mouth every 6 (six) hours as needed for mild pain or headache.   Marland Kitchen apixaban (ELIQUIS) 5 MG TABS tablet Take 1 tablet (5 mg total) by mouth 2 (two) times  daily.  . Calcium Carbonate Antacid (TUMS PO) Take 2 tablets by mouth daily as needed (heartburn).   . carbidopa-levodopa (SINEMET IR) 25-100 MG tablet TAKE 1 TABLET BY MOUTH 3  TIMES DAILY (Patient taking differently: Take 1 tablet by mouth 3 (three) times daily. )  . diltiazem (TIAZAC) 360 MG 24 hr capsule Take 1 capsule (360 mg total) by mouth daily.  . Magnesium Oxide 400 (240 Mg) MG TABS Take 1 tablet (400 mg total) by mouth daily.  . Multiple Vitamins-Minerals (CENTRUM SILVER 50+MEN) TABS Take by mouth daily.  . niacin 500 MG tablet Take 500 mg by mouth 2 (two) times daily.  . perindopril (ACEON) 8 MG tablet Take 8 mg by mouth daily.  . polyethylene glycol (MIRALAX / GLYCOLAX) packet Take 17 g by mouth daily as needed for mild constipation.   . tamsulosin (FLOMAX) 0.4 MG CAPS capsule Take 1 capsule (0.4 mg total) by mouth daily.  . [DISCONTINUED] apixaban (ELIQUIS) 5 MG TABS tablet Take 1 tablet (5 mg total) by mouth 2 (two) times daily.  . [DISCONTINUED] diltiazem (TIAZAC) 360 MG 24 hr capsule Take 1 capsule (360 mg total) by mouth daily.     Allergies:   Codeine, Doxycycline, and Metronidazole   Social History   Socioeconomic History  . Marital status: Married    Spouse  name: Not on file  . Number of children: 2  . Years of education: Not on file  . Highest education level: Bachelor's degree (e.g., BA, AB, BS)  Occupational History  . 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 Smoking status: Former Smoker    Types: Cigarettes    Quit date: 12/20/2012    Years since quitting: 6.8  . Smokeless tobacco: Never Used  Substance and Sexual Activity  . Alcohol use: No  . Drug use: No  . Sexual activity: Not Currently  Other Topics Concern  . Not on file  Social History Narrative  . Not on file   Social Determinants of Health   Financial Resource Strain:   . Difficulty of Paying Living Expenses:   Food Insecurity:    . Worried About Charity fundraiser in the Last Year:   . Arboriculturist in the Last Year:   Transportation Needs:   . Film/video editor (Medical):   Marland Kitchen Lack of Transportation (Non-Medical):   Physical Activity:   . Days of Exercise per Week:   . Minutes of Exercise per Session:   Stress:   . Feeling of Stress :   Social Connections:   . Frequency of Communication with Friends and Family:   . Frequency of Social Gatherings with Friends and Family:   . Attends Religious Services:   . Active Member of Clubs or Organizations:   . Attends Archivist Meetings:   Marland Kitchen Marital Status:      Family History: The patient's family history includes Healthy in his daughter and daughter; Heart attack in his brother; Heart attack (age of onset: 21) in his mother; Heart disease in his sister; Melanoma in his sister. There is no history of Stomach cancer or Colon cancer.  ROS:   Please see the history of present illness.     All other systems reviewed and are negative.  EKGs/Labs/Other Studies Reviewed:    The following studies were reviewed today: Echo 10/19/2019  EKG:  EKG is ordered today.  The ekg ordered today demonstrates NSR- artifact secondary to Parkinson's  Recent Labs: 10/19/2019: B Natriuretic Peptide 246.3; TSH 1.031 10/30/2019: ALT 18; BUN 18; Creatinine, Ser 1.16; Hemoglobin 13.8; Magnesium 2.0; Platelets 209; Potassium 3.8; Sodium 141  Recent Lipid Panel No results found for: CHOL, TRIG, HDL, CHOLHDL, VLDL, LDLCALC, LDLDIRECT  Physical Exam:    VS:  BP 130/62   Pulse 78   Ht 5\' 9"  (1.753 m)   Wt 167 lb 12.8 oz (76.1 kg)   SpO2 98%   BMI 24.78 kg/m     Wt Readings from Last 3 Encounters:  11/10/19 167 lb 12.8 oz (76.1 kg)  10/30/19 165 lb 2 oz (74.9 kg)  10/20/19 165 lb 1.6 oz (74.9 kg)     GEN:  Well nourished, well developed in no acute distress HEENT: Normal NECK: No JVD; No carotid bruits CARDIAC: RRR, no murmurs, rubs, gallops RESPIRATORY:   Clear to auscultation without rales, wheezing or rhonchi  ABDOMEN: Soft, non-tender, non-distended MUSCULOSKELETAL:  No edema; No deformity  SKIN: Warm and dry NEUROLOGIC:  Significant resting tremor from Parkinson's Alert and oriented x 3 PSYCHIATRIC:  Normal affect   ASSESSMENT:    New onset atrial fibrillation ([place]) Patient converted spontaneously with Diltiazem.  Essential hypertension Controlled- mild LVH with normal LVF on echo  Parkinson's disease (HCC) Significant tremor   Chronic anticoagulation CHADS  VASC=3- he is on Eliquis 5 mg BID  PLAN:    Same Rx- f/u Dr Stanford Breed in 6 months.   Medication Adjustments/Labs and Tests Ordered: Current medicines are reviewed at length with the patient today.  Concerns regarding medicines are outlined above.  Orders Placed This Encounter  Procedures  . EKG 12-Lead   Meds ordered this encounter  Medications  . diltiazem (TIAZAC) 360 MG 24 hr capsule    Sig: Take 1 capsule (360 mg total) by mouth daily.    Dispense:  90 capsule    Refill:  3  . apixaban (ELIQUIS) 5 MG TABS tablet    Sig: Take 1 tablet (5 mg total) by mouth 2 (two) times daily.    Dispense:  180 tablet    Refill:  1    Patient Instructions  Medication Instructions:  Your physician recommends that you continue on your current medications as directed. Please refer to the Current Medication list given to you today.  *If you need a refill on your cardiac medications before your next appointment, please call your pharmacy*   Follow-Up: At [facility], you and your health needs are our priority.  As part of our continuing mission to provide you with exceptional heart care, we have created designated Provider Care Teams.  These Care Teams include your primary Cardiologist (physician) and Advanced Practice Providers (APPs -  Physician Assistants and Nurse Practitioners) who all work together to provide you with the care you need, when you need it.  We recommend  signing up for the patient portal called "MyChart".  Sign up information is provided on this After Visit Summary.  MyChart is used to connect with patients for Virtual Visits (Telemedicine).  Patients are able to view lab/test results, encounter notes, upcoming appointments, etc.  Non-urgent messages can be sent to your provider as well.   To learn more about what you can do with MyChart, go to [email or web address].    Your next appointment:   6 month(s)  The format for your next appointment:   In Person  Provider:   You may see Kirk Ruths, MD or one of the following Advanced Practice Providers on your designated Care Team:    Kerin Ransom, PA-C  [place], [place]  Coletta Memos, [place]    Other Instructions Please call our office 2 months in advance to schedule your follow-up appointment.     Angelena Form, PA-C  11/10/2019 10:24 AM    [facility]

## 2019-11-10 NOTE — Assessment & Plan Note (Signed)
Controlled- mild LVH with normal LVF on echo

## 2019-11-10 NOTE — Patient Instructions (Addendum)
1.  increase carbidopa/levodopa 25/100, 1 tablet at  6am/10am/2pm/6pm 2.  Let me know if you want to consider botox for the drooling  The physicians and staff at [facility] are committed to providing excellent care. You may receive a survey requesting feedback about your experience at our office. We strive to receive "very good" responses to the survey questions. If you feel that your experience would prevent you from giving the office a "very good " response, please contact our office to try to remedy the situation. We may be reached at [phone]. Thank you for taking the time out of your busy day to complete the survey.

## 2019-11-10 NOTE — Patient Instructions (Signed)
Medication Instructions:  Your physician recommends that you continue on your current medications as directed. Please refer to the Current Medication list given to you today.  *If you need a refill on your cardiac medications before your next appointment, please call your pharmacy*   Follow-Up: At [facility], you and your health needs are our priority.  As part of our continuing mission to provide you with exceptional heart care, we have created designated Provider Care Teams.  These Care Teams include your primary Cardiologist (physician) and Advanced Practice Providers (APPs -  Physician Assistants and Nurse Practitioners) who all work together to provide you with the care you need, when you need it.  We recommend signing up for the patient portal called "MyChart".  Sign up information is provided on this After Visit Summary.  MyChart is used to connect with patients for Virtual Visits (Telemedicine).  Patients are able to view lab/test results, encounter notes, upcoming appointments, etc.  Non-urgent messages can be sent to your provider as well.   To learn more about what you can do with MyChart, go to [email or web address].    Your next appointment:   6 month(s)  The format for your next appointment:   In Person  Provider:   You may see Brian Crenshaw, MD or one of the following Advanced Practice Providers on your designated Care Team:    Luke Kilroy, PA-C  Callie Goodrich, PA-C  Jesse Cleaver, FNP    Other Instructions Please call our office 2 months in advance to schedule your follow-up appointment.  

## 2019-11-10 NOTE — Assessment & Plan Note (Signed)
Patient converted spontaneously with Diltiazem.

## 2019-11-10 NOTE — Assessment & Plan Note (Signed)
CHADS VASC=3- he is on Eliquis 5 mg BID

## 2020-02-02 DIAGNOSIS — L821 Other seborrheic keratosis: Secondary | ICD-10-CM | POA: Diagnosis not present

## 2020-02-02 DIAGNOSIS — L905 Scar conditions and fibrosis of skin: Secondary | ICD-10-CM | POA: Diagnosis not present

## 2020-02-02 DIAGNOSIS — L570 Actinic keratosis: Secondary | ICD-10-CM | POA: Diagnosis not present

## 2020-02-02 DIAGNOSIS — C44212 Basal cell carcinoma of skin of right ear and external auricular canal: Secondary | ICD-10-CM | POA: Diagnosis not present

## 2020-02-02 DIAGNOSIS — D692 Other nonthrombocytopenic purpura: Secondary | ICD-10-CM | POA: Diagnosis not present

## 2020-02-02 DIAGNOSIS — D485 Neoplasm of uncertain behavior of skin: Secondary | ICD-10-CM | POA: Diagnosis not present

## 2020-02-02 DIAGNOSIS — Z85828 Personal history of other malignant neoplasm of skin: Secondary | ICD-10-CM | POA: Diagnosis not present

## 2020-02-25 DIAGNOSIS — Z23 Encounter for immunization: Secondary | ICD-10-CM | POA: Diagnosis not present

## 2020-03-11 DIAGNOSIS — C44212 Basal cell carcinoma of skin of right ear and external auricular canal: Secondary | ICD-10-CM | POA: Diagnosis not present

## 2020-03-28 DIAGNOSIS — L570 Actinic keratosis: Secondary | ICD-10-CM | POA: Diagnosis not present

## 2020-04-06 ENCOUNTER — Other Ambulatory Visit: Payer: Self-pay | Admitting: Cardiology

## 2020-04-06 ENCOUNTER — Other Ambulatory Visit: Payer: Self-pay | Admitting: Neurology

## 2020-04-06 NOTE — Telephone Encounter (Signed)
Rx(s) sent to pharmacy electronically.  

## 2020-04-11 DIAGNOSIS — H0220C Unspecified lagophthalmos, bilateral, upper and lower eyelids: Secondary | ICD-10-CM | POA: Diagnosis not present

## 2020-04-11 DIAGNOSIS — H0102A Squamous blepharitis right eye, upper and lower eyelids: Secondary | ICD-10-CM | POA: Diagnosis not present

## 2020-04-11 DIAGNOSIS — H11823 Conjunctivochalasis, bilateral: Secondary | ICD-10-CM | POA: Diagnosis not present

## 2020-04-11 DIAGNOSIS — H04123 Dry eye syndrome of bilateral lacrimal glands: Secondary | ICD-10-CM | POA: Diagnosis not present

## 2020-04-11 DIAGNOSIS — H2513 Age-related nuclear cataract, bilateral: Secondary | ICD-10-CM | POA: Diagnosis not present

## 2020-04-11 DIAGNOSIS — R7309 Other abnormal glucose: Secondary | ICD-10-CM | POA: Diagnosis not present

## 2020-04-11 DIAGNOSIS — H0102B Squamous blepharitis left eye, upper and lower eyelids: Secondary | ICD-10-CM | POA: Diagnosis not present

## 2020-04-15 DIAGNOSIS — Z23 Encounter for immunization: Secondary | ICD-10-CM | POA: Diagnosis not present

## 2020-05-02 DIAGNOSIS — I129 Hypertensive chronic kidney disease with stage 1 through stage 4 chronic kidney disease, or unspecified chronic kidney disease: Secondary | ICD-10-CM | POA: Diagnosis not present

## 2020-05-02 DIAGNOSIS — R7309 Other abnormal glucose: Secondary | ICD-10-CM | POA: Diagnosis not present

## 2020-05-02 DIAGNOSIS — G20 Parkinson's disease: Secondary | ICD-10-CM | POA: Diagnosis not present

## 2020-05-02 DIAGNOSIS — J449 Chronic obstructive pulmonary disease, unspecified: Secondary | ICD-10-CM | POA: Diagnosis not present

## 2020-05-02 DIAGNOSIS — I4891 Unspecified atrial fibrillation: Secondary | ICD-10-CM | POA: Diagnosis not present

## 2020-05-02 DIAGNOSIS — N1831 Chronic kidney disease, stage 3a: Secondary | ICD-10-CM | POA: Diagnosis not present

## 2020-05-02 DIAGNOSIS — K635 Polyp of colon: Secondary | ICD-10-CM | POA: Diagnosis not present

## 2020-05-02 DIAGNOSIS — I700 Atherosclerosis of aorta: Secondary | ICD-10-CM | POA: Diagnosis not present

## 2020-05-02 DIAGNOSIS — N401 Enlarged prostate with lower urinary tract symptoms: Secondary | ICD-10-CM | POA: Diagnosis not present

## 2020-05-02 DIAGNOSIS — E785 Hyperlipidemia, unspecified: Secondary | ICD-10-CM | POA: Diagnosis not present

## 2020-05-09 NOTE — Progress Notes (Signed)
Assessment/Plan:   1.  Parkinsons Disease  -Atypical state has been in the differential, but we have not seen the expected decline.  Patient has declined EMG for MND but again significant decline has not been seen.  -Continue carbidopa/levodopa 25/100, 1 tablet at 6 AM/10 AM/2 PM/6 PM.  Dr. Forde Dandy refilled for him  -We discussed that it used to be thought that levodopa would increase risk of melanoma but now it is believed that Parkinsons itself likely increases risk of melanoma. he is to get regular skin checks.  He just saw dermatology on 10/1.  -they asked about focused ultrasound and we discussed this vs DBS.  I don't think that he needs either.  He is functioning at too high a level and he agrees (although tremor could be better controlled with sx and we discussed that)  2.  PAF  -On Cardizem.  On Eliquis.  Following with cardiology.  3.  Sialorrhea  -not needing botox   Subjective:   Michael Odonnell was seen today in follow up for Parkinsons disease.  My previous records were reviewed prior to todays visit as well as outside records available to me. Pt denies falls.  Pt denies lightheadedness, near syncope.  No hallucinations.  Mood has been good.   No swallowing trouble.  Walking for exercise.   In regards to drooling, the patient states that it is happening but not interesting in botox.  Current prescribed movement disorder medications: carbidopa/levodopa 25/100, 6am/10am/2pm/6pm   ALLERGIES:   Allergies  Allergen Reactions  . Codeine Nausea And Vomiting  . Doxycycline Nausea Only  . Metronidazole Nausea Only    CURRENT MEDICATIONS:  Outpatient Encounter Medications as of 05/12/2020  Medication Sig  . acetaminophen (TYLENOL) 500 MG tablet Take 1,000 mg by mouth every 6 (six) hours as needed for mild pain or headache.   . Calcium Carbonate Antacid (TUMS PO) Take 2 tablets by mouth daily as needed (heartburn).   . carbidopa-levodopa (SINEMET IR) 25-100 MG tablet TAKE 1  TABLET BY MOUTH 4  TIMES DAILY AT 6 AM, 10 AM, 2 PM AND 6 PM  . diltiazem (TIAZAC) 360 MG 24 hr capsule Take 1 capsule (360 mg total) by mouth daily.  Marland Kitchen ELIQUIS 5 MG TABS tablet TAKE 1 TABLET BY MOUTH  TWICE DAILY  . Magnesium 400 MG CAPS Take 1 tablet by mouth daily.  . Multiple Vitamins-Minerals (CENTRUM SILVER 50+MEN) TABS Take by mouth daily.  . niacin 500 MG tablet Take 500 mg by mouth 2 (two) times daily.  . perindopril (ACEON) 8 MG tablet Take 8 mg by mouth daily.  . polyethylene glycol (MIRALAX / GLYCOLAX) packet Take 17 g by mouth daily as needed for mild constipation.   . tamsulosin (FLOMAX) 0.4 MG CAPS capsule Take 1 capsule (0.4 mg total) by mouth daily.  . [DISCONTINUED] diltiazem (CARDIZEM CD) 180 MG 24 hr capsule Take 1 capsule (180 mg total) by mouth daily.   No facility-administered encounter medications on file as of 05/12/2020.    Objective:   PHYSICAL EXAMINATION:    VITALS:   Vitals:   05/12/20 1039  BP: (!) 155/66  Pulse: 72  SpO2: 95%  Weight: 169 lb (76.7 kg)  Height: 5\' 9"  (1.753 m)    GEN:  The patient appears stated age and is in NAD. HEENT:  Normocephalic, atraumatic.  The mucous membranes are moist. The superficial temporal arteries are without ropiness or tenderness. CV:  RRR Lungs:  CTAB Neck/HEME:  There are  no carotid bruits bilaterally.  Neurological examination:  Orientation: The patient is alert and oriented x3. Cranial nerves: There is good facial symmetry with facial hypomimia. The speech is fluent and clear. Soft palate rises symmetrically and there is no tongue deviation. Hearing is intact to conversational tone. Sensation: Sensation is intact to light touch throughout Motor: Strength is at least antigravity x4.  Movement examination: Tone: There is nl tone in the UE/LE Abnormal movements: there is L>RUE rest tremor Coordination:  There is no decremation with RAM's, with any form of RAMS, including alternating supination and pronation  of the forearm, hand opening and closing, finger taps, heel taps and toe taps. Gait and Station: The patient has no difficulty arising out of a deep-seated chair without the use of the hands. The patient's stride length is good.      I have reviewed and interpreted the following labs independently    Chemistry      Component Value Date/Time   NA 141 10/30/2019 1212   K 3.8 10/30/2019 1212   CL 103 10/30/2019 1212   CO2 27 10/30/2019 1212   BUN 18 10/30/2019 1212   CREATININE 1.16 10/30/2019 1212      Component Value Date/Time   CALCIUM 9.5 10/30/2019 1212   ALKPHOS 64 10/30/2019 1212   AST 21 10/30/2019 1212   ALT 18 10/30/2019 1212   BILITOT 1.3 (H) 10/30/2019 1212       Lab Results  Component Value Date   WBC 8.6 10/30/2019   HGB 13.8 10/30/2019   HCT 42.0 10/30/2019   MCV 91.7 10/30/2019   PLT 209 10/30/2019    Lab Results  Component Value Date   TSH 1.031 10/19/2019     Total time spent on today's visit was 30 minutes, including both face-to-face time and nonface-to-face time.  Time included that spent on review of records (prior notes available to me/labs/imaging if pertinent), discussing treatment and goals, answering patient's questions and coordinating care.  Cc:  Reynold Bowen, MD

## 2020-05-12 ENCOUNTER — Ambulatory Visit (INDEPENDENT_AMBULATORY_CARE_PROVIDER_SITE_OTHER): Payer: Medicare Other | Admitting: Neurology

## 2020-05-12 ENCOUNTER — Other Ambulatory Visit: Payer: Self-pay

## 2020-05-12 ENCOUNTER — Encounter: Payer: Self-pay | Admitting: Neurology

## 2020-05-12 ENCOUNTER — Other Ambulatory Visit: Payer: Self-pay | Admitting: Cardiology

## 2020-05-12 VITALS — BP 155/66 | HR 72 | Ht 69.0 in | Wt 169.0 lb

## 2020-05-12 DIAGNOSIS — G20 Parkinson's disease: Secondary | ICD-10-CM

## 2020-05-12 MED ORDER — APIXABAN 5 MG PO TABS: 5.0000 mg | ORAL_TABLET | Freq: Two times a day (BID) | ORAL | 1 refills | Status: DC

## 2020-05-12 NOTE — Telephone Encounter (Signed)
*  STAT* If patient is at the pharmacy, call can be transferred to refill team.   1. Which medications need to be refilled? (please list name of each medication and dose if known)  ELIQUIS 5 MG TABS tablet twice daily  2. Which pharmacy/location (including street and city if local pharmacy) is medication to be sent to? [facility], [place] - [address] AT [place]  3. Do they need a 30 day or 90 day supply? 90 with refills   Wife states that [facility] would not refill the eliquis. The patient only has enough medication to last him until Tuesday 05/17/20 but he does not see Dr. Stanford Breed until 06/13/20.  The wife would like an rx sent

## 2020-05-12 NOTE — Patient Instructions (Signed)
Keep exercising!  The physicians and staff at [facility] are committed to providing excellent care. You may receive a survey requesting feedback about your experience at our office. We strive to receive "very good" responses to the survey questions. If you feel that your experience would prevent you from giving the office a "very good " response, please contact our office to try to remedy the situation. We may be reached at [phone]. Thank you for taking the time out of your busy day to complete the survey.

## 2020-05-13 ENCOUNTER — Other Ambulatory Visit: Payer: Self-pay | Admitting: Cardiology

## 2020-05-13 MED ORDER — APIXABAN 5 MG PO TABS: 5.0000 mg | ORAL_TABLET | Freq: Two times a day (BID) | ORAL | 1 refills | Status: DC

## 2020-05-13 NOTE — Telephone Encounter (Signed)
Follow Up:     Wife is calling to check on the status of pt's Eliquis refill- need this asap

## 2020-05-13 NOTE — Telephone Encounter (Signed)
Prescription refill request for Eliquis received. Indication: Atrial Fibrillation Last office visit: 11/2019 Luke Scr: 1.16 10/2019 Age: 79 Weight: 76.7 kg  Prescription refilled

## 2020-05-15 ENCOUNTER — Other Ambulatory Visit: Payer: Self-pay | Admitting: Cardiology

## 2020-05-16 NOTE — Telephone Encounter (Signed)
Prescription refill request for Eliquis received. Indication: Atrial Fibrillation Last office visit: 11/2019  Luke Scr: 1.16  10/2019 Age: 79 Weight: 76.7 kg  Prescription refilled

## 2020-06-06 NOTE — Progress Notes (Deleted)
HPI: Follow-up atrial fibrillation.  Patient diagnosed with atrial fibrillation May 2021.  Echocardiogram at that time showed normal LV function and TSH normal.  Patient spontaneously converted to sinus rhythm.  Since last seen  Current Outpatient Medications  Medication Sig Dispense Refill  . acetaminophen (TYLENOL) 500 MG tablet Take 1,000 mg by mouth every 6 (six) hours as needed for mild pain or headache.     . Calcium Carbonate Antacid (TUMS PO) Take 2 tablets by mouth daily as needed (heartburn).     . carbidopa-levodopa (SINEMET IR) 25-100 MG tablet TAKE 1 TABLET BY MOUTH 4  TIMES DAILY AT 6 AM, 10 AM, 2 PM AND 6 PM 360 tablet 0  . diltiazem (TIAZAC) 360 MG 24 hr capsule Take 1 capsule (360 mg total) by mouth daily. 90 capsule 3  . ELIQUIS 5 MG TABS tablet TAKE 1 TABLET(5 MG) BY MOUTH TWICE DAILY 180 tablet 1  . Magnesium 400 MG CAPS Take 1 tablet by mouth daily.    . Multiple Vitamins-Minerals (CENTRUM SILVER 50+MEN) TABS Take by mouth daily.    . niacin 500 MG tablet Take 500 mg by mouth 2 (two) times daily.    . perindopril (ACEON) 8 MG tablet Take 8 mg by mouth daily.    . polyethylene glycol (MIRALAX / GLYCOLAX) packet Take 17 g by mouth daily as needed for mild constipation.     . tamsulosin (FLOMAX) 0.4 MG CAPS capsule Take 1 capsule (0.4 mg total) by mouth daily. 30 capsule 0   No current facility-administered medications for this visit.     Past Medical History:  Diagnosis Date  . Arthritis   . Colon polyps   . Degenerative lumbar disc   . Diverticulosis   . GERD (gastroesophageal reflux disease)   . Hypertension   . Parkinson's disease ([place])   . Sciatica   . 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 Tremor     Past Surgical History:  Procedure Laterality Date  . CLEFT LIP REPAIR    . CLEFT PALATE REPAIR    . SKIN GRAFT FULL THICKNESS LEG  12/2018    Social History   Socioeconomic History  . Marital status: Married    Spouse name:  Not on file  . Number of children: 2  . Years of education: Not on file  . Highest education level: Bachelor's degree (e.g., BA, AB, BS)  Occupational History  . 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 Smoking status: Former Smoker    Types: Cigarettes    Quit date: 12/20/2012    Years since quitting: 7.4  . Smokeless tobacco: Never Used  Vaping Use  . Vaping Use: Never used  Substance and Sexual Activity  . Alcohol use: No  . Drug use: No  . Sexual activity: Not Currently  Other Topics Concern  . Not on file  Social History Narrative  . Not on file   Social Determinants of Health   Financial Resource Strain: Not on file  Food Insecurity: Not on file  Transportation Needs: Not on file  Physical Activity: Not on file  Stress: Not on file  Social Connections: Not on file  Intimate Partner Violence: Not on file    Family History  Problem Relation Age of Onset  . Heart attack Mother 41  . Heart disease Sister   . Melanoma Sister   .  Heart attack Brother   . Healthy Daughter   . Healthy Daughter   . Stomach cancer Neg Hx   . Colon cancer Neg Hx     ROS: no fevers or chills, productive cough, hemoptysis, dysphasia, odynophagia, melena, hematochezia, dysuria, hematuria, rash, seizure activity, orthopnea, PND, pedal edema, claudication. Remaining systems are negative.  Physical Exam: Well-developed well-nourished in no acute distress.  Skin is warm and dry.  HEENT is normal.  Neck is supple.  Chest is clear to auscultation with normal expansion.  Cardiovascular exam is regular rate and rhythm.  Abdominal exam nontender or distended. No masses palpated. Extremities show no edema. neuro grossly intact  ECG- personally reviewed  A/P  1 paroxysmal atrial fibrillation-patient remains in sinus rhythm.  Continue Cardizem for rate control if atrial fibrillation recurs.  Continue apixaban.  If he has more  frequent episodes in the future we will consider addition of antiarrhythmic.  2 hypertension-patient's blood pressure is controlled.  Continue Cardizem at present dose.  3 Parkinson's-follow-up primary care.  Kirk Ruths, MD

## 2020-06-13 ENCOUNTER — Ambulatory Visit: Payer: Medicare Other | Admitting: Cardiology

## 2020-08-04 DIAGNOSIS — D485 Neoplasm of uncertain behavior of skin: Secondary | ICD-10-CM | POA: Diagnosis not present

## 2020-08-04 DIAGNOSIS — L820 Inflamed seborrheic keratosis: Secondary | ICD-10-CM | POA: Diagnosis not present

## 2020-08-04 DIAGNOSIS — L989 Disorder of the skin and subcutaneous tissue, unspecified: Secondary | ICD-10-CM | POA: Diagnosis not present

## 2020-08-04 DIAGNOSIS — D2262 Melanocytic nevi of left upper limb, including shoulder: Secondary | ICD-10-CM | POA: Diagnosis not present

## 2020-08-04 DIAGNOSIS — L905 Scar conditions and fibrosis of skin: Secondary | ICD-10-CM | POA: Diagnosis not present

## 2020-08-04 DIAGNOSIS — Z85828 Personal history of other malignant neoplasm of skin: Secondary | ICD-10-CM | POA: Diagnosis not present

## 2020-09-05 NOTE — Progress Notes (Signed)
HPI: Follow-up atrial fibrillation.  Admitted May 2021 with atrial fibrillation.  Echocardiogram showed ejection fraction 65 to 70%, mild left ventricular hypertrophy.  TSH 1.031.  Since last seen there is no dyspnea, chest pain, palpitations, syncope or bleeding.  Current Outpatient Medications  Medication Sig Dispense Refill  . acetaminophen (TYLENOL) 500 MG tablet Take 1,000 mg by mouth every 6 (six) hours as needed for mild pain or headache.     . Calcium Carbonate Antacid (TUMS PO) Take 2 tablets by mouth daily as needed (heartburn).     . carbidopa-levodopa (SINEMET IR) 25-100 MG tablet TAKE 1 TABLET BY MOUTH 4  TIMES DAILY AT 6 AM, 10 AM, 2 PM AND 6 PM 360 tablet 0  . diltiazem (TIAZAC) 360 MG 24 hr capsule Take 1 capsule (360 mg total) by mouth daily. 90 capsule 3  . ELIQUIS 5 MG TABS tablet TAKE 1 TABLET(5 MG) BY MOUTH TWICE DAILY 180 tablet 1  . Magnesium 400 MG CAPS Take 1 tablet by mouth daily.    . Multiple Vitamins-Minerals (CENTRUM SILVER 50+MEN) TABS Take by mouth daily.    . niacin 500 MG tablet Take 500 mg by mouth 2 (two) times daily.    . perindopril (ACEON) 8 MG tablet Take 8 mg by mouth daily.    . polyethylene glycol (MIRALAX / GLYCOLAX) packet Take 17 g by mouth daily as needed for mild constipation.     . tamsulosin (FLOMAX) 0.4 MG CAPS capsule Take 1 capsule (0.4 mg total) by mouth daily. 30 capsule 0   No current facility-administered medications for this visit.     Past Medical History:  Diagnosis Date  . Arthritis   . Colon polyps   . Degenerative lumbar disc   . Diverticulosis   . GERD (gastroesophageal reflux disease)   . Hypertension   . Parkinson's disease ([place])   . Sciatica   . 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 Tremor     Past Surgical History:  Procedure Laterality Date  . CLEFT LIP REPAIR    . CLEFT PALATE REPAIR    . SKIN GRAFT FULL THICKNESS LEG  12/2018    Social History   Socioeconomic History  .  Marital status: Married    Spouse name: Not on file  . Number of children: 2  . Years of education: Not on file  . Highest education level: Bachelor's degree (e.g., BA, AB, BS)  Occupational History  . 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 Smoking status: Former Smoker    Types: Cigarettes    Quit date: 12/20/2012    Years since quitting: 7.7  . Smokeless tobacco: Never Used  Vaping Use  . Vaping Use: Never used  Substance and Sexual Activity  . Alcohol use: No  . Drug use: No  . Sexual activity: Not Currently  Other Topics Concern  . Not on file  Social History Narrative  . Not on file   Social Determinants of Health   Financial Resource Strain: Not on file  Food Insecurity: Not on file  Transportation Needs: Not on file  Physical Activity: Not on file  Stress: Not on file  Social Connections: Not on file  Intimate Partner Violence: Not on file    Family History  Problem Relation Age of Onset  . Heart attack Mother 96  . Heart disease Sister   .  Melanoma Sister   . Heart attack Brother   . Healthy Daughter   . Healthy Daughter   . Stomach cancer Neg Hx   . Colon cancer Neg Hx     ROS: no fevers or chills, productive cough, hemoptysis, dysphasia, odynophagia, melena, hematochezia, dysuria, hematuria, rash, seizure activity, orthopnea, PND, pedal edema, claudication. Remaining systems are negative.  Physical Exam: Well-developed well-nourished in no acute distress.  Skin is warm and dry.  HEENT is normal.  Neck is supple.  Chest is clear to auscultation with normal expansion.  Cardiovascular exam is regular rate and rhythm.  Abdominal exam nontender or distended. No masses palpated. Extremities show no edema. neuro resting tremor from Parkinson's but no focal findings.  A/P  1 paroxysmal atrial fibrillation-patient remains in sinus rhythm on exam.  Continue Cardizem and apixaban.  Check  hemoglobin and renal function.  2 hypertension-blood pressure controlled.  Continue present medications.  3 history of Parkinson's-follow-up primary care.  Kirk Ruths, MD

## 2020-09-09 ENCOUNTER — Other Ambulatory Visit: Payer: Self-pay

## 2020-09-09 ENCOUNTER — Ambulatory Visit (INDEPENDENT_AMBULATORY_CARE_PROVIDER_SITE_OTHER): Payer: Medicare Other | Admitting: Cardiology

## 2020-09-09 ENCOUNTER — Encounter: Payer: Self-pay | Admitting: Cardiology

## 2020-09-09 VITALS — BP 128/64 | HR 70 | Ht 68.5 in | Wt 171.2 lb

## 2020-09-09 DIAGNOSIS — I10 Essential (primary) hypertension: Secondary | ICD-10-CM

## 2020-09-09 DIAGNOSIS — I480 Paroxysmal atrial fibrillation: Secondary | ICD-10-CM

## 2020-09-09 LAB — BASIC METABOLIC PANEL
BUN/Creatinine Ratio: 19 (ref 10–24)
BUN: 25 mg/dL (ref 8–27)
CO2: 25 mmol/L (ref 20–29)
Calcium: 10 mg/dL (ref 8.6–10.2)
Chloride: 99 mmol/L (ref 96–106)
Creatinine, Ser: 1.33 mg/dL — ABNORMAL HIGH (ref 0.76–1.27)
Glucose: 97 mg/dL (ref 65–99)
Potassium: 4.6 mmol/L (ref 3.5–5.2)
Sodium: 138 mmol/L (ref 134–144)
eGFR: 53 mL/min/{1.73_m2} — ABNORMAL LOW (ref >59)

## 2020-09-09 LAB — CBC
Hematocrit: 40.9 % (ref 37.5–51.0)
Hemoglobin: 14.1 g/dL (ref 13.0–17.7)
MCH: 30.4 pg (ref 26.6–33.0)
MCHC: 34.5 g/dL (ref 31.5–35.7)
MCV: 88 fL (ref 79–97)
Platelets: 239 10*3/uL (ref 150–450)
RBC: 4.64 x10E6/uL (ref 4.14–5.80)
RDW: 12.3 % (ref 11.6–15.4)
WBC: 8.5 10*3/uL (ref 3.4–10.8)

## 2020-09-09 MED ORDER — APIXABAN 5 MG PO TABS: ORAL_TABLET | ORAL | 3 refills | Status: DC

## 2020-09-09 MED ORDER — DILTIAZEM HCL ER BEADS 360 MG PO CP24: 360.0000 mg | ORAL_CAPSULE | Freq: Every day | ORAL | 3 refills | Status: DC

## 2020-09-09 NOTE — Patient Instructions (Signed)

## 2020-09-12 ENCOUNTER — Encounter: Payer: Self-pay | Admitting: *Deleted

## 2020-09-12 DIAGNOSIS — D485 Neoplasm of uncertain behavior of skin: Secondary | ICD-10-CM | POA: Diagnosis not present

## 2020-09-12 DIAGNOSIS — L821 Other seborrheic keratosis: Secondary | ICD-10-CM | POA: Diagnosis not present

## 2020-10-11 DIAGNOSIS — L821 Other seborrheic keratosis: Secondary | ICD-10-CM | POA: Diagnosis not present

## 2020-10-11 DIAGNOSIS — L570 Actinic keratosis: Secondary | ICD-10-CM | POA: Diagnosis not present

## 2020-10-11 DIAGNOSIS — L814 Other melanin hyperpigmentation: Secondary | ICD-10-CM | POA: Diagnosis not present

## 2020-10-11 IMAGING — DX DG CHEST 1V PORT
1 series · 1 of 1 positions shown · non-contrast
Comparison: None.

CLINICAL DATA: Shortness of breath

EXAM:
PORTABLE CHEST 1 VIEW

[chest]
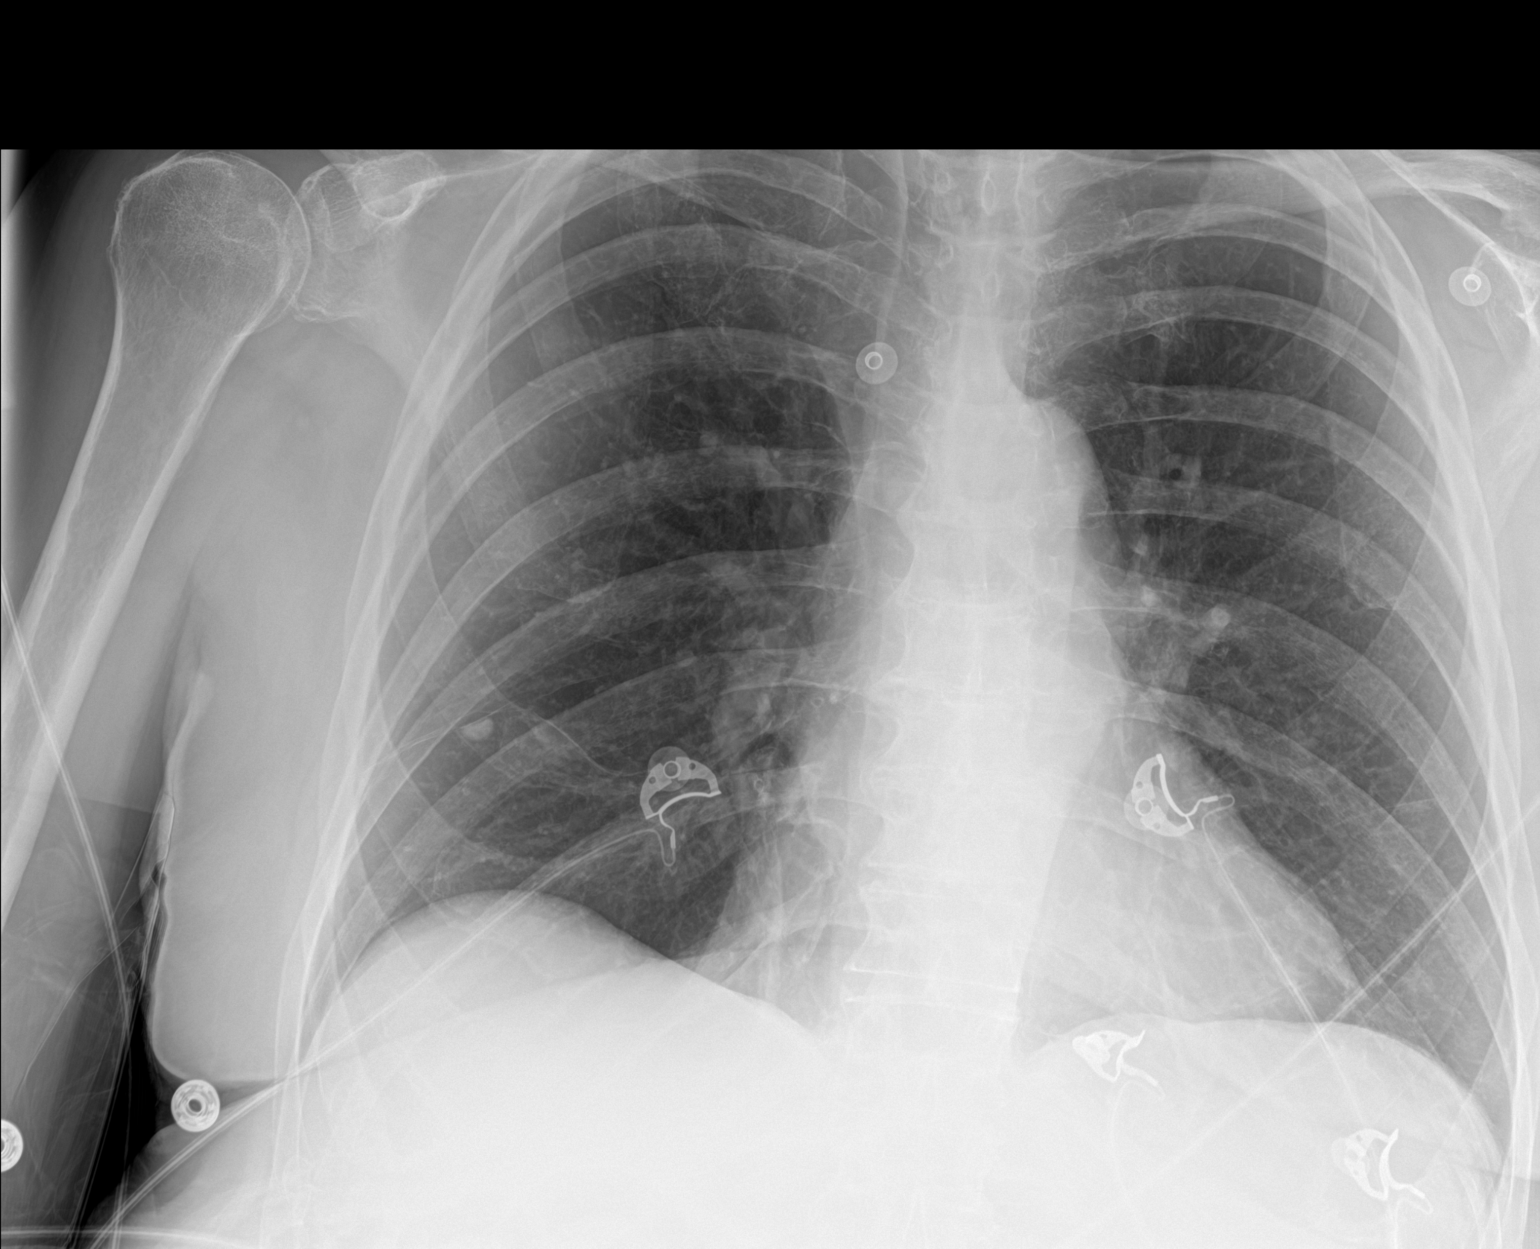

[1 of 1 positions shown; findings below may reference images not displayed]

FINDINGS: Probable calcified granuloma overlying the right lung base. No
consolidation or edema. No pleural effusion or pneumothorax.
Cardiomediastinal contours are within normal limits.
IMPRESSION: No acute process in the chest.

## 2020-10-28 DIAGNOSIS — D696 Thrombocytopenia, unspecified: Secondary | ICD-10-CM | POA: Diagnosis not present

## 2020-10-28 DIAGNOSIS — E785 Hyperlipidemia, unspecified: Secondary | ICD-10-CM | POA: Diagnosis not present

## 2020-10-28 DIAGNOSIS — Z125 Encounter for screening for malignant neoplasm of prostate: Secondary | ICD-10-CM | POA: Diagnosis not present

## 2020-10-31 NOTE — Progress Notes (Signed)
Assessment/Plan:   1.  Parkinsons Disease  -continue carbidopa/levodopa 25/100, 1 tablet at 6 AM/10 AM/2 PM/6 PM  -Patient does follow regularly with dermatology.  -Met with the LCSW today.  2.  Sialorrhea  -Not bad enough for intervention.  3.  PAF  -On Cardizem and Eliquis.  Following with cardiology.  4.  Constipation  -discussed nature and pathophysiology and association with PD  -discussed importance of hydration.  Pt is to increase water intake  -pt is given a copy of the rancho recipe  -recommended daily colace  -recommended miralax prn    Subjective:   Michael Odonnell was seen today in follow up for Parkinsons disease.  My previous records were reviewed prior to todays visit as well as outside records available to me. Pt denies falls.  Pt denies lightheadedness, near syncope.  No hallucinations.  Mood has been good.  He is walking daily for exercise and doing floor exercise.  Noting some constipation.  Current prescribed movement disorder medications: carbidopa/levodopa 25/100, 6am/10am/2pm/6pm    ALLERGIES:   Allergies  Allergen Reactions  . Codeine Nausea And Vomiting  . Doxycycline Nausea Only  . Metronidazole Nausea Only    CURRENT MEDICATIONS:  Outpatient Encounter Medications as of 11/02/2020  Medication Sig  . apixaban (ELIQUIS) 5 MG TABS tablet TAKE 1 TABLET(5 MG) BY MOUTH TWICE DAILY  . Calcium Carbonate Antacid (TUMS PO) Take 2 tablets by mouth daily as needed (heartburn).   . carbidopa-levodopa (SINEMET IR) 25-100 MG tablet TAKE 1 TABLET BY MOUTH 4  TIMES DAILY AT 6 AM, 10 AM, 2 PM AND 6 PM  . diltiazem (TIAZAC) 360 MG 24 hr capsule Take 1 capsule (360 mg total) by mouth daily.  . Magnesium 400 MG CAPS Take 1 tablet by mouth daily.  . Multiple Vitamins-Minerals (CENTRUM SILVER 50+MEN) TABS Take by mouth daily.  . niacin 500 MG tablet Take 500 mg by mouth 2 (two) times daily.  . perindopril (ACEON) 8 MG tablet Take 8 mg by mouth daily.  .  polyethylene glycol (MIRALAX / GLYCOLAX) packet Take 17 g by mouth daily as needed for mild constipation.   . tamsulosin (FLOMAX) 0.4 MG CAPS capsule Take 1 capsule (0.4 mg total) by mouth daily.  Marland Kitchen acetaminophen (TYLENOL) 500 MG tablet Take 1,000 mg by mouth every 6 (six) hours as needed for mild pain or headache.    No facility-administered encounter medications on file as of 11/02/2020.    Objective:   PHYSICAL EXAMINATION:    VITALS:   Vitals:   11/02/20 0921  BP: 120/72  Pulse: 74  SpO2: 96%  Weight: 168 lb (76.2 kg)  Height: 5' 9.5" (1.765 m)    GEN:  The patient appears stated age and is in NAD. HEENT:  Normocephalic, atraumatic.  The mucous membranes are moist. The superficial temporal arteries are without ropiness or tenderness. CV:  RRR Lungs:  CTAB Neck/HEME:  There are no carotid bruits bilaterally.  Neurological examination:  Orientation: The patient is alert and oriented x3. Cranial nerves: There is good facial symmetry with facial hypomimia. The speech is fluent and clear. Soft palate rises symmetrically and there is no tongue deviation. Hearing is intact to conversational tone. Sensation: Sensation is intact to light touch throughout Motor: Strength is at least antigravity x4.  Movement examination: Tone: There is nl tone in the UE/LE Abnormal movements: there is bilateral UE rest tremor Coordination:  There is no decremation with RAM's, with any form of RAMS, including alternating  supination and pronation of the forearm, hand opening and closing, finger taps, heel taps and toe taps. Gait and Station: The patient has no difficulty arising out of a deep-seated chair without the use of the hands. The patient's stride length is good.    I have reviewed and interpreted the following labs independently    Chemistry      Component Value Date/Time   NA 138 09/09/2020 1114   K 4.6 09/09/2020 1114   CL 99 09/09/2020 1114   CO2 25 09/09/2020 1114   BUN 25  09/09/2020 1114   CREATININE 1.33 (H) 09/09/2020 1114      Component Value Date/Time   CALCIUM 10.0 09/09/2020 1114   ALKPHOS 64 10/30/2019 1212   AST 21 10/30/2019 1212   ALT 18 10/30/2019 1212   BILITOT 1.3 (H) 10/30/2019 1212       Lab Results  Component Value Date   WBC 8.5 09/09/2020   HGB 14.1 09/09/2020   HCT 40.9 09/09/2020   MCV 88 09/09/2020   PLT 239 09/09/2020    Lab Results  Component Value Date   TSH 1.031 10/19/2019     Total time spent on today's visit was 20 minutes, including both face-to-face time and nonface-to-face time.  Time included that spent on review of records (prior notes available to me/labs/imaging if pertinent), discussing treatment and goals, answering patient's questions and coordinating care.  Cc:  Reynold Bowen, MD

## 2020-11-02 ENCOUNTER — Encounter: Payer: Self-pay | Admitting: Neurology

## 2020-11-02 ENCOUNTER — Other Ambulatory Visit: Payer: Self-pay

## 2020-11-02 ENCOUNTER — Ambulatory Visit (INDEPENDENT_AMBULATORY_CARE_PROVIDER_SITE_OTHER): Payer: Medicare Other | Admitting: Neurology

## 2020-11-02 VITALS — BP 120/72 | HR 74 | Ht 69.5 in | Wt 168.0 lb

## 2020-11-02 DIAGNOSIS — G20 Parkinson's disease: Secondary | ICD-10-CM

## 2020-11-02 MED ORDER — CARBIDOPA-LEVODOPA 25-100 MG PO TABS: ORAL_TABLET | ORAL | 2 refills | Status: DC

## 2020-11-02 NOTE — Patient Instructions (Addendum)
T-Shirt design contest   SUBMIT UNIQUE DESIGN TO WIN!  Winning design will be featured on T-shirts to support our local Parkinson's patients.   Contest rules and information in on link below .   Contest ends on August 31,2022    How to [employer] your own unique quote or a design that will stand out and support the ideas behind Parkinson's Awareness  This contest is open to everyone. The winning design or quote will be selected by a group of unbiased judges. Judges will vote based on quality, relevance, and aesthetic. Judges will not have access to the names associated with the submission. Submissions accepted until: February 08, 2021 What You Win Your own quote or design will be printed on a t-shirt that will support Parkinson's Awareness!! Feel good knowing your quote/artwork will help spread awareness for Parkinson's contribute and 100% of profits to the local area . Tips for Submissions We will accept submissions in the form of quotes, Financial risk analyst, or hand drawn artwork.  Mission/Theme: The Power of One ! Working together to support and treat to improve quality of life for Parkinson's Patients Rules & Regulations All designs and quotes must be original and not subject to copyright of another person or entity. Distribution and Reproduction Rights for all quotes and artwork will be transferred to [facility] [employer] upon submission to the contest via the [employer] form on February 08, 2021  Online Resources for Power over Parkinson's Group May 2022  . Local [place] Online Groups  o Power over [employer] :   - Power Over Parkinson's Patient Education Group will be Wednesday, May 11th at 2pm via Zoom.   - Upcoming Power over Parkinson's Meetings:  2nd Wednesdays of the month at 2 pm:  June 8th, July 13th - Contact Amy Marriott at [email or web address] if interested in participating in this online group o Parkinson's Care Partners Group:     3rd Mondays, Contact Misty Paladino o Atypical Parkinsonian Patient Group:   4th Wednesdays, Contact Misty Paladino o If you are interested in participating in these online groups with Misty, please contact her directly for how to join those meetings.  Her contact information is [email or web address].   . [place]:  [email or web address] o PD Health at Home continues:  Mindfulness Mondays, Expert Briefing Tuesdays, Wellness Wednesdays, Take Time Thursdays, Fitness Fridays -Listings for May 2022 are on the website o Upcoming Webinar:  Newly Diagnosed Building a Better Life with Parkinson   Wednesday, May 18th @ 1 pm o Register for Armed forces operational officer) at [email or web address] o  Please check out their website to sign up for emails and see their full online offerings  . [place]:  [email or web address]  o Upcoming Webinar:   What's in your DNA, understanding Parkinson's genetics.  Thursday, May 19th @ 12 noon o Check out additional information on their website to see their full online offerings  . [place]:  [email or web address] o Upcoming Webinar:  Stay tuned o Care Partner Monthly Meetup.  With Robin Searing Phinney.  First Tuesday of each month, 2 pm o Joy Breaks:  First Wednesday of each month, 2-3 pm. There will be art, doodling, making, crafting, listening, laughing, stories, and everything in between. No art experience necessary. No supplies required. Just show up for joy!  Register on their website. o Check out additional information to Live Well Today on their website  . Parkinson and Movement Disorders (PMD) Alliance:  www.pmdalliance.org o [employer]:  Oncologist  Events o Sign up for emails, which are sent weekly to give you updates on programming and online offerings     . Parkinson's Association of the [place]:  www.[email or web address] o Information on online support groups,  education events, and online exercises including Yoga, Parkinson's exercises and more-LOTS of information on links to PD resources and online events o Virtual Support Group through Parkinson's Association of the [place]; next one is scheduled for Wednesday, May 4th, 2022 at 2 pm. (These are typically scheduled for the 1st Wednesday of the month at 2 pm).  Visit website for details.  . Additional links for movement activities: o PWR! Moves Classes at [place] RESUMED!  Wednesdays 10 and 11 am.  Contact Amy Marriott, PT [email or web address] or [phone] if interested o Here is a link to the PWR!Moves classes on Zoom from [place] - Daily Mon-Sat at 10:00. Via Zoom, FREE and open to all.  There is also a link below via Facebook if you use that platform. - [email or web address].si - [email or web address].no o Parkinson's Wellness Recovery (PWR! Moves)  www.[email or web address] - Info on the PWR! Virtual Experience:  You will have access to our expertise through self-assessment, guided plans that start with the PD-specific fundamentals, educational content, tips, Q&A with an expert, and a growing Art therapist of PD-specific pre-recorded and live exercise classes of varying types and intensity - both physical and cognitive! If that is not enough, we offer 1:1 wellness consultations (in-person or virtual) to personalize your PWR! Research scientist (medical).  - Check out the PWR! Move of the month on the [place] [facility] website:  [email or web address] o [employer] Fridays:  - As part of the PD Health @ Home program, this free video series focuses each week on one aspect of fitness designed to support people living with Parkinson's.  These weekly  videos highlight the [place] recent fitness guidelines for people with Parkinson's disease. -  [email or web address].dk o Dance for PD website is offering free, live-stream classes throughout the week, as well as links to [employer] of classes:  [email or web address] o Dance for Parkinson's Class:  [place].  Free offering for people with Parkinson's and care partners; virtual class.  o For more information, contact [phone] or email Ruffin Frederick at [email or web address] o Virtual dance and Pilates for Parkinson's classes: Click on the Community Tab> Parkinson's Movement Initiative Tab.  To register for classes and for more information, visit www.[employer] and click the "community" tab.    o [employer] Parkinson's Cycling Classes  - [facility] [employer]: 1pm on Fridays-Live classes at 12-29-1990 ([employer] at [email or web address] or [phone]) [phone] [employer]: Virtual Classes Mondays and Thursdays 07-26-1987 classes Tuesday, Wednesday and Thursday (contact [place] at [email or web address]  or [phone])  o [place] levels of classes are offered Tuesdays and Thursdays:  10:30 am,  12 noon & 1:45 pm at [facility].  - Active Stretching with [facility] Class starting in March, on Fridays - To observe a class or for  more information, call [phone] or email [email or web address] . [facility]: o [phone] Opportunities:  [email or web address]/[email or web address]/[email or web address]-support-group.  You may also contact Chief Technology Officer at [email or web address] or [phone].   o [facility] for Family Caregivers! Thursday, May 12th 10:00a-1:30p [place] 07-08-2000, [employer], [place], [place] You may contact [address] at  [email or web address] or [phone].   o Well-Spring Navigator:  Just1Navigator program, a free service to help individuals and families  through the journey of determining care for older adults.  The "Navigator" is a Education officer, museum, Arnell Asal, who will speak with a prospective client and/or loved ones to provide an assessment of the situation and a set of recommendations for a personalized care plan -- all free of charge, and whether [facility] [employer] offers the needed service or not. If the need is not a service we provide, we are well-connected with reputable programs in town that we can refer you to.  www.well-[email or web address] or to speak with the Navigator, call [phone].  Constipation and Parkinson's disease:  1.Rancho recipe for constipation in Parkinsons Disease:  -1 cup of unprocessed bran (need to get this at [employer], [employer] or similar type of store), 2 cups of applesauce in 1 cup of prune juice 2.  Increase fiber intake (Metamucil,vegetables) 3.  Regular, moderate exercise can be beneficial. 4.  Avoid medications causing constipation, such as medications like antacids with calcium or magnesium 5.  It's okay to take daily Miralax, and taper if stools become too loose or you experience diarrhea 6.  Stool softeners (Colace) can help with chronic constipation and I recommend you take this daily. 7.  Increase water intake.  You should be drinking 1/2 gallon of water a day as long as you have not been diagnosed with congestive heart failure or renal/kidney failure.  This is probably the single greatest thing that you can do to help your constipation.

## 2020-11-04 DIAGNOSIS — K317 Polyp of stomach and duodenum: Secondary | ICD-10-CM | POA: Diagnosis not present

## 2020-11-04 DIAGNOSIS — N401 Enlarged prostate with lower urinary tract symptoms: Secondary | ICD-10-CM | POA: Diagnosis not present

## 2020-11-04 DIAGNOSIS — J449 Chronic obstructive pulmonary disease, unspecified: Secondary | ICD-10-CM | POA: Diagnosis not present

## 2020-11-04 DIAGNOSIS — I4891 Unspecified atrial fibrillation: Secondary | ICD-10-CM | POA: Diagnosis not present

## 2020-11-04 DIAGNOSIS — Z1212 Encounter for screening for malignant neoplasm of rectum: Secondary | ICD-10-CM | POA: Diagnosis not present

## 2020-11-04 DIAGNOSIS — I10 Essential (primary) hypertension: Secondary | ICD-10-CM | POA: Diagnosis not present

## 2020-11-04 DIAGNOSIS — E785 Hyperlipidemia, unspecified: Secondary | ICD-10-CM | POA: Diagnosis not present

## 2020-11-04 DIAGNOSIS — K635 Polyp of colon: Secondary | ICD-10-CM | POA: Diagnosis not present

## 2020-11-04 DIAGNOSIS — R82998 Other abnormal findings in urine: Secondary | ICD-10-CM | POA: Diagnosis not present

## 2020-11-04 DIAGNOSIS — R7309 Other abnormal glucose: Secondary | ICD-10-CM | POA: Diagnosis not present

## 2020-11-04 DIAGNOSIS — G20 Parkinson's disease: Secondary | ICD-10-CM | POA: Diagnosis not present

## 2020-11-04 DIAGNOSIS — I700 Atherosclerosis of aorta: Secondary | ICD-10-CM | POA: Diagnosis not present

## 2020-11-04 DIAGNOSIS — N1831 Chronic kidney disease, stage 3a: Secondary | ICD-10-CM | POA: Diagnosis not present

## 2020-11-04 DIAGNOSIS — Z0000 Encounter for general adult medical examination without abnormal findings: Secondary | ICD-10-CM | POA: Diagnosis not present

## 2021-02-01 DIAGNOSIS — L905 Scar conditions and fibrosis of skin: Secondary | ICD-10-CM | POA: Diagnosis not present

## 2021-02-01 DIAGNOSIS — Z85828 Personal history of other malignant neoplasm of skin: Secondary | ICD-10-CM | POA: Diagnosis not present

## 2021-02-01 DIAGNOSIS — L814 Other melanin hyperpigmentation: Secondary | ICD-10-CM | POA: Diagnosis not present

## 2021-02-01 DIAGNOSIS — L218 Other seborrheic dermatitis: Secondary | ICD-10-CM | POA: Diagnosis not present

## 2021-02-17 DIAGNOSIS — Z23 Encounter for immunization: Secondary | ICD-10-CM | POA: Diagnosis not present

## 2021-03-01 DIAGNOSIS — Z23 Encounter for immunization: Secondary | ICD-10-CM | POA: Diagnosis not present

## 2021-03-20 ENCOUNTER — Ambulatory Visit (INDEPENDENT_AMBULATORY_CARE_PROVIDER_SITE_OTHER): Payer: Medicare Other | Admitting: Neurology

## 2021-03-20 ENCOUNTER — Other Ambulatory Visit: Payer: Self-pay

## 2021-03-20 ENCOUNTER — Encounter: Payer: Self-pay | Admitting: Neurology

## 2021-03-20 VITALS — BP 125/53 | HR 73 | Ht 69.0 in | Wt 169.4 lb

## 2021-03-20 DIAGNOSIS — G20 Parkinson's disease: Secondary | ICD-10-CM | POA: Diagnosis not present

## 2021-03-20 NOTE — Progress Notes (Signed)
Assessment/Plan:   1.  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 he follows regularly with dermatology.  Pt with hx of BCC  -Refer to [place]  -attending our support group and met with lcsw today shortly   2.  Sialorrhea  -Not bad enough for intervention.  3.  PAF  -On Cardizem and Eliquis.  Following with cardiology.     Subjective:   Michael Odonnell was seen today in follow up for Parkinsons disease.  My previous records were reviewed prior to todays visit as well as outside records available to me. Wife with patient and supplements hx.  Pt denies falls.  Seems to be "losing voice."  Walking for exercise.  Pt denies lightheadedness, near syncope.  No hallucinations.  Drooling about same - not too bad.  Saw derm and went well.   Current prescribed movement disorder medications: carbidopa/levodopa 25/100, 6am/10am/2pm/6pm    ALLERGIES:   Allergies  Allergen Reactions   Codeine Nausea And Vomiting   Doxycycline Nausea Only   Metronidazole Nausea Only    CURRENT MEDICATIONS:  Outpatient Encounter Medications as of 03/20/2021  Medication Sig   acetaminophen (TYLENOL) 500 MG tablet Take 1,000 mg by mouth every 6 (six) hours as needed for mild pain or headache.    apixaban (ELIQUIS) 5 MG TABS tablet TAKE 1 TABLET(5 MG) BY MOUTH TWICE DAILY   Calcium Carbonate Antacid (TUMS PO) Take 2 tablets by mouth daily as needed (heartburn).    carbidopa-levodopa (SINEMET IR) 25-100 MG tablet 1 TABLET AT 6 AM, 10 AM, 2 PM AND 6 PM   diltiazem (TIAZAC) 360 MG 24 hr capsule Take 1 capsule (360 mg total) by mouth daily.   Magnesium 400 MG CAPS Take 1 tablet by mouth daily.   Multiple Vitamins-Minerals (CENTRUM SILVER 50+MEN) TABS Take by mouth  daily.   niacin 500 MG tablet Take 500 mg by mouth 2 (two) times daily.   perindopril (ACEON) 8 MG tablet Take 8 mg by mouth daily.   polyethylene glycol (MIRALAX / GLYCOLAX) packet Take 17 g by mouth daily as needed for mild constipation.    tamsulosin (FLOMAX) 0.4 MG CAPS capsule Take 1 capsule (0.4 mg total) by mouth daily.   No facility-administered encounter medications on file as of 03/20/2021.    Objective:   PHYSICAL EXAMINATION:    VITALS:   Vitals:   03/20/21 1103  BP: (!) 125/53  Pulse: 73  SpO2: 97%  Weight: 169 lb 6.4 oz (76.8 kg)  Height: _0  (1.753 m)     GEN:  The patient appears stated age and is in NAD. HEENT:  Normocephalic, atraumatic.  The mucous membranes are moist. The superficial temporal arteries are without ropiness or tenderness. CV:  RRR Lungs:  CTAB Neck/HEME:  There are no carotid bruits bilaterally.  Neurological examination:  Orientation: The patient is alert and oriented x3. Cranial nerves: There is good facial symmetry with facial hypomimia. The speech is fluent and clear. Soft palate rises symmetrically and there is no tongue deviation. Hearing is intact to conversational tone. Sensation: Sensation is intact to light touch throughout Motor: Strength is at least antigravity x4.  Movement examination: Tone: There is nl tone in the UE/LE Abnormal movements: there is bilateral UE rest tremor  Coordination:  There is no decremation with RAM's, with any form of RAMS, including alternating supination and pronation of the forearm, hand opening and closing, finger taps, heel taps and toe taps. Gait and Station: The patient has no difficulty arising out of a deep-seated chair without the use of the hands. The patient's stride length is good.  Mild trouble in the turns  I have reviewed and interpreted the following labs independently    Chemistry      Component Value Date/Time   NA 138 09/09/2020 1114   K 4.6 09/09/2020 1114   CL 99  09/09/2020 1114   CO2 25 09/09/2020 1114   BUN 25 09/09/2020 1114   CREATININE 1.33 (H) 09/09/2020 1114      Component Value Date/Time   CALCIUM 10.0 09/09/2020 1114   ALKPHOS 64 10/30/2019 1212   AST 21 10/30/2019 1212   ALT 18 10/30/2019 1212   BILITOT 1.3 (H) 10/30/2019 1212       Lab Results  Component Value Date   WBC 8.5 09/09/2020   HGB 14.1 09/09/2020   HCT 40.9 09/09/2020   MCV 88 09/09/2020   PLT 239 09/09/2020    Lab Results  Component Value Date   TSH 1.031 10/19/2019     Total time spent on today's visit was 22 minutes, including both face-to-face time and nonface-to-face time.  Time included that spent on review of records (prior notes available to me/labs/imaging if pertinent), discussing treatment and goals, answering patient's questions and coordinating care.  Cc:  Reynold Bowen, MD

## 2021-04-12 ENCOUNTER — Ambulatory Visit: Payer: Medicare Other | Attending: Neurology

## 2021-04-12 DIAGNOSIS — R471 Dysarthria and anarthria: Secondary | ICD-10-CM | POA: Insufficient documentation

## 2021-04-12 NOTE — Patient Instructions (Signed)
  Read 25 sentences in that effortful, strong voice, twice a day.

## 2021-04-12 NOTE — Therapy (Signed)
[facility] [address], [place], [place], [address] Phone: [phone]   Fax:  [phone]  Speech Language Pathology Evaluation  Patient Details  Name: Michael Odonnell MRN: [id number] Date of Birth: 1941-03-06 Referring Provider (SLP): Alonza Bogus, DO   Encounter Date: 04/12/2021   End of Session - 04/12/21 1726     Visit Number 1    Number of Visits 25    Date for SLP Re-Evaluation 07/11/21    SLP Start Time 0933    SLP Stop Time  4580    SLP Time Calculation (min) 42 min    Activity Tolerance Patient tolerated treatment well             Past Medical History:  Diagnosis Date   Arthritis    Colon polyps    Degenerative lumbar disc    Diverticulosis    GERD (gastroesophageal reflux disease)    Hypertension    Parkinson's disease ([place])    Sciatica    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   Subjective Assessment - 04/12/21 0936     Subjective "You have somthing like this it really cuts you down." (pt, re: PD)    Patient is accompained by: Family member   Michael Odonnell -wife   Currently in Pain? No/denies                SLP Evaluation [facility] - 04/12/21 9983       SLP Visit Information   SLP Received On 04/12/21    Referring Provider (SLP) Alonza Bogus, DO    Onset Date 2016    Medical Diagnosis Parkinson's disease      Subjective   Patient/Family Stated Goal Improve communication      General Information   HPI Pt with PD - last MD visit on 03-20-21 wife shared pt appears to be "losing his voice." Pt was referred for ST. PMH: Afib, HTN.    Mobility Status ambulated independently into clinic today      Prior Functional Status   Cognitive/Linguistic Baseline Within functional limits    Type  of Home House     Lives With Spouse    Available Support Family    Vocation Retired      Associate Professor   Overall Cognitive Status Within Functional Limits for tasks assessed      Auditory Comprehension   Overall Auditory Comprehension Appears within functional limits for tasks assessed      Verbal Expression   Overall Verbal Expression Appears within functional limits for tasks assessed      Written Expression   Dominant Hand Right    Written Expression --   "My writing is not that good"     Motor Speech   Overall Motor Speech Impaired    Respiration Impaired    Level of Impairment Sentence   pt would occasionally speak on residual volume; syllable count for breath length was reduced; Once, when reading pt took a fuller breath and syllable count was 15/breath instead of ~6-8/breath. SLP made pt aware of this.   Intelligibility Intelligibility reduced    Effective Techniques Increased vocal intensity  SLP Education - 04/12/21 1724     Education Details possible goals, breath support and loudness targeted, loud "hey", loud /a/, roles of PT and OT in PD care    Person(s) Educated Patient;Spouse    Methods Explanation;Demonstration;Verbal cues;Handout    Comprehension Verbalized understanding;Returned demonstration;Verbal cues required;Need further instruction              SLP Short Term Goals - 04/12/21 1735       SLP SHORT TERM GOAL #1   Title pt will produce loud /a/ or "hey!" with at least low 90s dB average over three sessions    Time 4    Period Weeks    Status New    Target Date 05/12/21      SLP SHORT TERM GOAL #2   Title pt will generate abdominal breathing 80% of the time in seated position in 3 sessions    Time 4    Period Weeks    Status New    Target Date 05/12/21      SLP SHORT TERM GOAL #3   Title Pt will produce 16/20 sentence responses with WNL volume over 3 sessions    Time 4    Period Weeks    Status  New    Target Date 05/12/21              SLP Long Term Goals - 04/12/21 1738       SLP LONG TERM GOAL #1   Title when reading pt will demo abdominal breathing (AB) 80% of the time in 3 sessions    Time 8    Period Weeks    Status New    Target Date 06/09/21      SLP LONG TERM GOAL #2   Title Pt will produce 5 minutes simple conversation with WNL volume over three sessions    Time 8    Period Weeks    Status New    Target Date 06/09/21      SLP LONG TERM GOAL #3   Title pt will generate abdominal breathing 80% of the time in 5 minutes simple conversation over 2 sessions    Time 8    Period Weeks    Status New    Target Date 06/09/21      SLP LONG TERM GOAL #4   Title pt will increase PROM (Communication Effectiveness Survey) to 21/32 (baseline 15/32)    Time 12    Period Weeks    Status New    Target Date 07/11/21      SLP LONG TERM GOAL #5   Title pt will generate average loudness of low 70s dB in 10 minutes simple conversation over 3 sessions    Time 12    Period Weeks    Status New    Target Date 07/11/21      Additional Long Term Goals   Additional Long Term Goals Yes      SLP LONG TERM GOAL #6   Title pt will demo abdominal breathing 80% of the time in 10 minutes simple conversation over 3 sessions    Time 12    Period Weeks    Status New    Target Date 07/11/21              Plan - 04/12/21 1727     Clinical Impression Statement Pt presents today with 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 If pt reports difficulty with overt s/s aspiration during meals he may  require objective swallow eval during this therapy course (MBSS, FEES). Don's wife Michael Odonnell states that she has had incr'd difficulty hearing pt in conversation over the last few years, and specifically cannot hear pt especially when he is in another room or is turning around and walking away from her. Pt talked at suboptimal dB (mid 60s dB average); reading was low to mid  60s dB including speaking on residual lung volume. He answered yes/no questions with volume also averaged in mid 60s dB. Pt produced loud "hey" at low 90s dB, and loud /a/ with mid 80s dB. Pt was then told to use same effort as with loud /a/ and loud "hey" in 60 seconds conversation and volume improved to upper 60s - low 70s dB, with some dyspnea noted. Michael Odonnell told SLP that if at home she would have understood pt at that volume if he were walking away from her. Pt scored his communication outcome measure at 15/32, Michael Odonnell rated pt's outcome measure at 13/32 (lower scores indicate less effective communcation). SLP believes pt will benefit from skilled ST targeting incr'd volume and greater usage of abdomoinal breathing in order to improve communicative effectiveness. SLP may suggest referral to pulmonologist to referring MD and/or PCP if pt appears to cont to have difficulty with breathing for speech.    Speech Therapy Frequency 2x / week    Duration 12 weeks    Treatment/Interventions Compensatory strategies;Internal/external aids;Patient/family education;SLP instruction and feedback;Cueing hierarchy;Functional tasks;Other (comment)   HEP including loud /a/ or "hey" and everyday sentences   Potential to [place] provided, to read conversational sentences    Consulted and Agree with Plan of Care Patient             Patient will benefit from skilled therapeutic intervention in order to improve the following deficits and impairments:   Dysarthria and anarthria    Problem List Patient Active Problem List   Diagnosis Date Noted   Chronic anticoagulation 11/10/2019   New onset atrial fibrillation ([place]) 10/19/2019   Essential hypertension 10/19/2019   Parkinson's disease ([place]) 10/19/2019   BPH (benign prostatic hyperplasia) 10/19/2019   Nausea 11/07/2017    Briellah Baik ,MS, CCC-SLP  04/12/2021, 5:48 PM  [facility] [address] [place], [place], [address] Phone: [phone]   Fax:  [phone]  Name: Michael Odonnell MRN: [id number] Date of Birth: 07/02/1940

## 2021-04-17 ENCOUNTER — Other Ambulatory Visit: Payer: Self-pay

## 2021-04-17 ENCOUNTER — Ambulatory Visit: Payer: Medicare Other

## 2021-04-17 DIAGNOSIS — R471 Dysarthria and anarthria: Secondary | ICD-10-CM

## 2021-04-17 NOTE — Patient Instructions (Signed)
   UNTIL NEXT SESSION:  10 loud "hey" (BREATH beforehand!) twice a day Read out loud for 30-45 seconds, x5, twice a day

## 2021-04-17 NOTE — Therapy (Signed)
[facility] [address], [place], [place], [address] Phone: [phone]   Fax:  [phone]  Speech Language Pathology Treatment  Patient Details  Name: Michael Odonnell MRN: [id number] Date of Birth: 12-11-1940 Referring Provider (SLP): Alonza Bogus, DO   Encounter Date: 04/17/2021   End of Session - 04/17/21 1011     Visit Number 2    Number of Visits 25    Date for SLP Re-Evaluation 07/11/21    SLP Start Time 0848    SLP Stop Time  0933    SLP Time Calculation (min) 45 min    Activity Tolerance Patient tolerated treatment well             Past Medical History:  Diagnosis Date   Arthritis    Colon polyps    Degenerative lumbar disc    Diverticulosis    GERD (gastroesophageal reflux disease)    Hypertension    Parkinson's disease ([place])    Sciatica    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   Subjective Assessment - 04/17/21 0851     Subjective "Monday morning!"    Patient is accompained by: Family member    Currently in Pain? No/denies                   ADULT SLP TREATMENT - 04/17/21 0852       General Information   Behavior/Cognition Alert;Cooperative;Pleasant mood      Treatment Provided   Treatment provided Cognitive-Linquistic      Cognitive-Linquistic Treatment   Treatment focused on Dysarthria    Skilled Treatment Pt arrived today and produced sentences with mid 70s dB and effort level of 7/10 (10=most effort possible). SLP worked with pt to recalibrate his speech loudness by producing loud "hey" with average mid 90s dB x3, x2. Min-mod A needed for taking breath beforehand. Then pt responded in sentences to stimuli with mid-upper 70s dB. In conversation pt was WNL volume  initially but decr'd to sub WNL after 3 minutes. When SLP made pt aware of this decr, his loudness in conversation improved to WNL again for 2 1/2 minutes consistently, with WNL breath pattern 95% of the time. SLP explained recalibration of speech musculature necessary, even with "softspoken" talkers. Additionally, [name] with question about drooling - SLP shared suggestion about lozenges or gum to facilitate swallowing.      Assessment / Recommendations / Plan   Plan Continue with current plan of care      Progression Toward Goals   Progression toward goals Progressing toward goals              SLP Education - 04/17/21 1011     Education Details recalibration needed of speech musculature, strategies to control drooling    Person(s) Educated Patient;Spouse    Methods Explanation    Comprehension Verbalized understanding;Returned demonstration;Verbal cues required;Need further instruction              SLP Short Term Goals - 04/17/21 1012       SLP SHORT TERM GOAL #1   Title pt will produce loud /a/ or "hey!" with at least low 90s dB average over three  sessions    Time 4    Period Weeks    Status On-going    Target Date 05/12/21      SLP SHORT TERM GOAL #2   Title pt will generate abdominal breathing 80% of the time in seated position in 3 sessions    Time 4    Period Weeks    Status On-going    Target Date 05/12/21      SLP SHORT TERM GOAL #3   Title Pt will produce 16/20 sentence responses with WNL volume over 3 sessions    Time 4    Period Weeks    Status On-going    Target Date 05/12/21              SLP Long Term Goals - 04/17/21 1013       SLP LONG TERM GOAL #1   Title when reading pt will demo abdominal breathing (AB) 80% of the time in 3 sessions    Time 8    Period Weeks    Status On-going      SLP LONG TERM GOAL #2   Title Pt will produce 5 minutes simple conversation with WNL volume over three sessions    Time 8    Period Weeks    Status  On-going      SLP LONG TERM GOAL #3   Title pt will generate abdominal breathing 80% of the time in 5 minutes simple conversation over 2 sessions    Time 8    Period Weeks    Status On-going      SLP LONG TERM GOAL #4   Title pt will increase PROM (Communication Effectiveness Survey) to 21/32 (baseline 15/32)    Time 12    Period Weeks    Status On-going      SLP LONG TERM GOAL #5   Title pt will generate average loudness of low 70s dB in 10 minutes simple conversation over 3 sessions    Time 12    Period Weeks    Status On-going      SLP LONG TERM GOAL #6   Title pt will demo abdominal breathing 80% of the time in 10 minutes simple conversation over 3 sessions    Time 12    Period Weeks    Status On-going              Plan - 04/17/21 1011     Clinical Impression Statement Pt presents today with 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 If pt reports difficulty with overt s/s aspiration during meals he may require objective swallow eval during this therapy course (MBSS, FEES). See "skilled intervention" for details on today's session. SLP believes pt will cont to benefit from skilled ST targeting incr'd volume and greater usage of abdomoinal breathing in order to improve communicative effectiveness. SLP may suggest referral to pulmonologist to referring MD and/or PCP if pt appears to cont to have difficulty with breathing for speech.    Speech Therapy Frequency 2x / week    Duration 12 weeks    Treatment/Interventions Compensatory strategies;Internal/external aids;Patient/family education;SLP instruction and feedback;Cueing hierarchy;Functional tasks;Other (comment)   HEP including loud /a/ or "hey" and everyday sentences   Potential to [place] provided, to read conversational sentences    Consulted and Agree with Plan of Care Patient             Patient will benefit from skilled therapeutic intervention  in order  to improve the following deficits and impairments:   Dysarthria and anarthria    Problem List Patient Active Problem List   Diagnosis Date Noted   Chronic anticoagulation 11/10/2019   New onset atrial fibrillation ([place]) 10/19/2019   Essential hypertension 10/19/2019   Parkinson's disease ([place]) 10/19/2019   BPH (benign prostatic hyperplasia) 10/19/2019   Nausea 11/07/2017    Kewon Statler ,MS, CCC-SLP  04/17/2021, 10:13 AM  [facility] [address], [place], [place], [address] Phone: [phone]   Fax:  [phone]   Name: Jahi Roza MRN: [id number] Date of Birth: April 05, 1941

## 2021-04-19 ENCOUNTER — Ambulatory Visit: Payer: Medicare Other

## 2021-04-19 ENCOUNTER — Other Ambulatory Visit: Payer: Self-pay

## 2021-04-19 DIAGNOSIS — R471 Dysarthria and anarthria: Secondary | ICD-10-CM | POA: Diagnosis not present

## 2021-04-19 NOTE — Patient Instructions (Addendum)
  Handouts - 15-20 minutes , at least once per day   EVERYDAY SENTENCES - say these twice a day in a LOUD (SHOUTING) voice after your loud "hey"s  Good morning honey!  Honey what's for breakfast?  I think I hear the mailman coming.  That was a good meal.  ___________________________________   ____________________________________   ____________________________________   ____________________________________   _____________________________________   _____________________________________

## 2021-04-19 NOTE — Therapy (Signed)
[facility] [address] [place], [place], [address] Phone: [phone]   Fax:  [phone]  Speech Language Pathology Treatment  Patient Details  Name: Michael Odonnell MRN: [id number] Date of Birth: 06/29/1940 Referring Provider (SLP): Alonza Bogus, DO   Encounter Date: 04/19/2021   End of Session - 04/19/21 1340     Visit Number 3    Number of Visits 25    Date for SLP Re-Evaluation 07/11/21    SLP Start Time 0853    SLP Stop Time  0933    SLP Time Calculation (min) 40 min    Activity Tolerance Patient tolerated treatment well             Past Medical History:  Diagnosis Date   Arthritis    Colon polyps    Degenerative lumbar disc    Diverticulosis    GERD (gastroesophageal reflux disease)    Hypertension    Parkinson's disease ([place])    Sciatica    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   Subjective Assessment - 04/19/21 0939     Subjective (wife [name]) "He's becoming more aware (of the need to talk louder)."    Patient is accompained by: Family member   wife-[name]   Currently in Pain? No/denies                   ADULT SLP TREATMENT - 04/19/21 0940       General Information   Behavior/Cognition Alert;Cooperative;Pleasant mood      Treatment Provided   Treatment provided Cognitive-Linquistic      Cognitive-Linquistic Treatment   Treatment focused on Dysarthria    Skilled Treatment Pt arrived today and producing loud "hey" with average mid 90s dB. Min-initial A needed for taking breath beforehand. Pt responded in sentences to min incr'd cognitive load stimuli with mid 70s dB. In sentences further increasing cognitive load, pt's responses were decr'd into WNL volume range, and  carried over intermittently into pt comments between stimuli. With incr'd cognitive load - telling meanings of expressions, pt with average volume upper 60s-lower 70s dB. Pt with WNL breath pattern 95% of the time. SLP began pt thinking aobut everyday sentences and he thought of 4 with wife's help (said one that was not said everyday). SLP provided homework for conversational topics of 4-5 turns each with wife providing nonverbal cues if pt is not loud enough. SLP also encouarged [name] to use nonverbal cues with Timmothy Sours during the day too.      Assessment / Recommendations / Plan   Plan Continue with current plan of care      Progression Toward Goals   Progression toward goals Progressing toward goals              SLP Education - 04/19/21 1340     Education Details everyday sentences procedure and rationale, homework    Person(s) Educated Patient;Spouse    Methods Explanation    Comprehension Verbalized understanding;Returned demonstration;Verbal cues required;Need further instruction              SLP Short Term Goals - 04/19/21 1342       SLP SHORT TERM GOAL #  1   Title pt will produce loud /a/ or "hey!" with at least low 90s dB average over three sessions    Baseline 04-19-21    Time 4    Period Weeks    Status On-going    Target Date 05/12/21      SLP SHORT TERM GOAL #2   Title pt will generate abdominal breathing 80% of the time in seated position in 3 sessions    Time 4    Period Weeks    Status On-going    Target Date 05/12/21      SLP SHORT TERM GOAL #3   Title Pt will produce 16/20 sentence responses with WNL volume over 3 sessions    Time 4    Period Weeks    Status On-going    Target Date 05/12/21              SLP Long Term Goals - 04/19/21 1343       SLP LONG TERM GOAL #1   Title when reading pt will demo abdominal breathing (AB) 80% of the time in 3 sessions    Time 8    Period Weeks    Status On-going    Target Date 06/09/21      SLP LONG  TERM GOAL #2   Title Pt will produce 5 minutes simple conversation with WNL volume over three sessions    Time 8    Period Weeks    Status On-going    Target Date 06/09/21      SLP LONG TERM GOAL #3   Title pt will generate abdominal breathing 80% of the time in 5 minutes simple conversation over 2 sessions    Time 8    Period Weeks    Status On-going    Target Date 06/09/21      SLP LONG TERM GOAL #4   Title pt will increase PROM (Communication Effectiveness Survey) to 21/32 (baseline 15/32)    Time 12    Period Weeks    Status On-going    Target Date 07/11/21      SLP LONG TERM GOAL #5   Title pt will generate average loudness of low 70s dB in 10 minutes simple conversation over 3 sessions    Time 12    Period Weeks    Status On-going    Target Date 07/11/21      SLP LONG TERM GOAL #6   Title pt will demo abdominal breathing 80% of the time in 10 minutes simple conversation over 3 sessions    Time 12    Period Weeks    Status On-going    Target Date 07/11/21              Plan - 04/19/21 1341     Clinical Impression Statement Pt presents today with 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 If pt reports difficulty with overt s/s aspiration during meals he may require objective swallow eval during this therapy course (MBSS, FEES). Wfe reports she feels pt is more aware of the need to speak louder. See "skilled intervention" for details on today's session. SLP believes pt will cont to benefit from skilled ST targeting incr'd volume and greater usage of abdomoinal breathing in order to improve communicative effectiveness. SLP may suggest referral to pulmonologist to referring MD and/or PCP if pt appears to cont to have difficulty with breathing for speech.    Speech Therapy Frequency 2x / week    Duration 12  weeks    Treatment/Interventions Compensatory strategies;Internal/external aids;Patient/family education;SLP instruction and feedback;Cueing  hierarchy;Functional tasks;Other (comment)   HEP including loud /a/ or "hey" and everyday sentences   Potential to [place] provided, to read conversational sentences    Consulted and Agree with Plan of Care Patient             Patient will benefit from skilled therapeutic intervention in order to improve the following deficits and impairments:   Dysarthria and anarthria    Problem List Patient Active Problem List   Diagnosis Date Noted   Chronic anticoagulation 11/10/2019   New onset atrial fibrillation ([place]) 10/19/2019   Essential hypertension 10/19/2019   Parkinson's disease ([place]) 10/19/2019   BPH (benign prostatic hyperplasia) 10/19/2019   Nausea 11/07/2017    Nayelie Gionfriddo ,MS, CCC-SLP  04/19/2021, 1:44 PM  [facility] [address], [place], [place], [address] Phone: [phone]   Fax:  [phone]   Name: Shaquon Gropp MRN: [id number] Date of Birth: 09-May-1941

## 2021-04-24 ENCOUNTER — Other Ambulatory Visit: Payer: Self-pay

## 2021-04-24 ENCOUNTER — Ambulatory Visit: Payer: Medicare Other

## 2021-04-24 DIAGNOSIS — R471 Dysarthria and anarthria: Secondary | ICD-10-CM | POA: Diagnosis not present

## 2021-04-24 NOTE — Patient Instructions (Signed)
  Please complete the assigned speech therapy homework prior to your next session.  

## 2021-04-24 NOTE — Therapy (Signed)
[facility] [address] [place], [place], [address] Phone: [phone]   Fax:  [phone]  Speech Language Pathology Treatment  Patient Details  Name: Michael Odonnell MRN: [id number] Date of Birth: [birth year] Referring Provider (SLP): Alonza Bogus, DO   Encounter Date: 04/24/2021   End of Session - 04/24/21 1001     Visit Number 4    Number of Visits 25    Date for SLP Re-Evaluation 07/11/21    SLP Start Time 0848    SLP Stop Time  0930    SLP Time Calculation (min) 42 min    Activity Tolerance Patient tolerated treatment well             Past Medical History:  Diagnosis Date   Arthritis    Colon polyps    Degenerative lumbar disc    Diverticulosis    GERD (gastroesophageal reflux disease)    Hypertension    Parkinson's disease ([place])    Sciatica    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   Subjective Assessment - 04/24/21 0849     Subjective "Here we are - Monday morning."    Patient is accompained by: Family member   Michael Odonnell - wife   Currently in Pain? No/denies                   ADULT SLP TREATMENT - 04/24/21 0850       General Information   Behavior/Cognition Alert;Cooperative;Pleasant mood      Treatment Provided   Treatment provided Cognitive-Linquistic      Cognitive-Linquistic Treatment   Treatment focused on Dysarthria    Skilled Treatment Pt arrived today with louder volume in general, walking back to ST room. He produced loud "hey" with average mid 90s dB. Min A x1 needed for taking a good breath beforehand. Pt recited his everyday sentences with mid 80s dB average - min A x1 for loudness. Pt and wife agreed pt is more intelligible at home than prior to ST. Pt responded in  sentences to min incr'd cognitive load stimuli with lower 70s dB average. In sentences further increasing cognitive load, pt's responses were also in WNL volume range with some short dips into upper 60s dB, without pt awareness. In general, pt comments between stimuli were with WNL volume, however pt stated, "when I see you I am louder." SLP encouraged pt and wife to converse during the day stressing WNL volume.      Assessment / Recommendations / Plan   Plan Continue with current plan of care      Progression Toward Goals   Progression toward goals Progressing toward goals                SLP Short Term Goals - 04/24/21 1002       SLP SHORT TERM GOAL #1   Title pt will produce loud /a/ or "hey!" with at least low 90s dB average over three sessions    Baseline 04-19-21, 04-24-21    Time 4    Period Weeks    Status On-going    Target Date 05/12/21      SLP SHORT TERM GOAL #2  Title pt will generate abdominal breathing 80% of the time in seated position in 3 sessions    Time 4    Period Weeks    Status On-going    Target Date 05/12/21      SLP SHORT TERM GOAL #3   Title Pt will produce 16/20 sentence responses with WNL volume over 3 sessions    Baseline 04-24-21    Time 4    Period Weeks    Status On-going    Target Date 05/12/21              SLP Long Term Goals - 04/24/21 1003       SLP LONG TERM GOAL #1   Title when reading pt will demo abdominal breathing (AB) 80% of the time in 3 sessions    Time 8    Period Weeks    Status On-going    Target Date 06/09/21      SLP LONG TERM GOAL #2   Title Pt will produce 5 minutes simple conversation with WNL volume over three sessions    Time 8    Period Weeks    Status On-going    Target Date 06/09/21      SLP LONG TERM GOAL #3   Title pt will generate abdominal breathing 80% of the time in 5 minutes simple conversation over 2 sessions    Time 8    Period Weeks    Status On-going    Target Date 06/09/21       SLP LONG TERM GOAL #4   Title pt will increase PROM (Communication Effectiveness Survey) to 21/32 (baseline 15/32)    Time 12    Period Weeks    Status On-going    Target Date 07/11/21      SLP LONG TERM GOAL #5   Title pt will generate average loudness of low 70s dB in 10 minutes simple conversation over 3 sessions    Time 12    Period Weeks    Status On-going    Target Date 07/11/21      SLP LONG TERM GOAL #6   Title pt will demo abdominal breathing 80% of the time in 10 minutes simple conversation over 3 sessions    Time 12    Period Weeks    Status On-going    Target Date 07/11/21              Plan - 04/24/21 1001     Clinical Impression Statement Pt presents today with 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 If pt reports difficulty with overt s/s aspiration during meals he may require objective swallow eval during this therapy course (MBSS, FEES). Wife and pt cont to report pt appears louder at home than prior to [place]. See "skilled intervention" for details on today's session. SLP believes pt will cont to benefit from skilled ST targeting incr'd volume and greater usage of abdomoinal breathing in order to improve communicative effectiveness. SLP may suggest referral to pulmonologist to referring MD and/or PCP if pt appears to cont to have difficulty with breathing for speech.    Speech Therapy Frequency 2x / week    Duration 12 weeks    Treatment/Interventions Compensatory strategies;Internal/external aids;Patient/family education;SLP instruction and feedback;Cueing hierarchy;Functional tasks;Other (comment)   HEP including loud /a/ or "hey" and everyday sentences   Potential to [place] provided, to read conversational sentences    Consulted and Agree with Plan  of Care Patient             Patient will benefit from skilled therapeutic intervention in order to improve the following deficits and impairments:    Dysarthria and anarthria    Problem List Patient Active Problem List   Diagnosis Date Noted   Chronic anticoagulation 11/10/2019   New onset atrial fibrillation ([place]) 10/19/2019   Essential hypertension 10/19/2019   Parkinson's disease ([place]) 10/19/2019   BPH (benign prostatic hyperplasia) 10/19/2019   Nausea 11/07/2017    Michael Maiorana ,MS, CCC-SLP  04/24/2021, 10:03 AM  [facility] [address], [place], [place], [address] Phone: [phone]   Fax:  [phone]   Name: Juriel Cid MRN: [id number] Date of Birth: December 21, 1940

## 2021-04-26 ENCOUNTER — Other Ambulatory Visit: Payer: Self-pay

## 2021-04-26 ENCOUNTER — Ambulatory Visit: Payer: Medicare Other

## 2021-04-26 DIAGNOSIS — R471 Dysarthria and anarthria: Secondary | ICD-10-CM | POA: Diagnosis not present

## 2021-04-26 NOTE — Therapy (Signed)
[facility] [address], [place], [place], [address] Phone: [phone]   Fax:  [phone]  Speech Language Pathology Treatment  Patient Details  Name: Michael Odonnell MRN: [id number] Date of Birth: 1941/05/30 Referring Provider (SLP): Alonza Bogus, DO   Encounter Date: 04/26/2021   End of Session - 04/26/21 1654     Visit Number 5    Number of Visits 25    Date for SLP Re-Evaluation 07/11/21    SLP Start Time 0935    SLP Stop Time  7858    SLP Time Calculation (min) 40 min    Activity Tolerance Patient tolerated treatment well             Past Medical History:  Diagnosis Date   Arthritis    Colon polyps    Degenerative lumbar disc    Diverticulosis    GERD (gastroesophageal reflux disease)    Hypertension    Parkinson's disease ([place])    Sciatica    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   Subjective Assessment - 04/26/21 0938     Patient is accompained by: Family member    Currently in Pain? No/denies                   ADULT SLP TREATMENT - 04/26/21 0943       General Information   Behavior/Cognition Alert;Cooperative;Pleasant mood      Treatment Provided   Treatment provided Cognitive-Linquistic      Cognitive-Linquistic Treatment   Treatment focused on Dysarthria    Skilled Treatment SLP revisited OT and PT evaluation possibility today - pt and wife will cont to think about this. Pt and wife deny overt s/sx dysphagia with meals/liquids. SLP told pt and wife to make note of choking/coughing/throat clearing and contact PCP or DO if frequency should incr. "Hey" with upper 80s-low 90s dB. SLP shaped pt's "hey for longer [facility], and then to loud extended /a/. SLP able to shape "ha"  given fact that pt's /a/ productions were forced and with hard glottal onset. Average with /ha/ was mid to upper 80s dB. Michael Odonnell was successful at [employer] of his message with external cues for loudness 90% of the time. SLP then worked with pt on abdominal breathing (AB) with good to excellent success with model faded to modified independent. SLP still noted some breathing on residual volume with stimuli provided today but in general, breath support was improved with AB.      Assessment / Recommendations / Plan   Plan Continue with current plan of care      Progression Toward Goals   Progression toward goals Progressing toward goals              SLP Education - 04/26/21 1654     Education Details abdominal breathing (AB)    Person(s) Educated Patient;Spouse    Methods Explanation;Demonstration;Verbal cues    Comprehension Verbalized understanding;Returned demonstration;Verbal cues required;Need further instruction              SLP Short Term Goals - 04/26/21 1655       SLP SHORT TERM GOAL #1   Title pt will produce  loud /a/ or "hey!" with at least low 90s dB average over three sessions    Baseline 04-19-21, 04-24-21    Time 4    Period Weeks    Status On-going    Target Date 05/12/21      SLP SHORT TERM GOAL #2   Title pt will generate abdominal breathing 80% of the time in seated position in 3 sessions    Baseline 04-26-21    Time 4    Period Weeks    Status On-going    Target Date 05/12/21      SLP SHORT TERM GOAL #3   Title Pt will produce 16/20 sentence responses with WNL volume over 3 sessions    Baseline 04-24-21, 04-26-21    Time 4    Period Weeks    Status On-going    Target Date 05/12/21              SLP Long Term Goals - 04/26/21 1656       SLP LONG TERM GOAL #1   Title when reading pt will demo abdominal breathing (AB) 80% of the time in 3 sessions    Time 8    Period Weeks    Status On-going    Target Date 06/09/21      SLP LONG  TERM GOAL #2   Title Pt will produce 5 minutes simple conversation with WNL volume over three sessions    Time 8    Period Weeks    Status On-going    Target Date 06/09/21      SLP LONG TERM GOAL #3   Title pt will generate abdominal breathing 80% of the time in 5 minutes simple conversation over 2 sessions    Time 8    Period Weeks    Status On-going    Target Date 06/09/21      SLP LONG TERM GOAL #4   Title pt will increase PROM (Communication Effectiveness Survey) to 21/32 (baseline 15/32)    Time 12    Period Weeks    Status On-going    Target Date 07/11/21      SLP LONG TERM GOAL #5   Title pt will generate average loudness of low 70s dB in 10 minutes simple conversation over 3 sessions    Time 12    Period Weeks    Status On-going    Target Date 07/11/21      SLP LONG TERM GOAL #6   Title pt will demo abdominal breathing 80% of the time in 10 minutes simple conversation over 3 sessions    Time 12    Period Weeks    Status On-going    Target Date 07/11/21              Plan - 04/26/21 1654     Clinical Impression Statement Michael Odonnell cont to present today with 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 Pt and wife do not report overt s/s aspiration during meals today; in the future he may require objective swallow eval during this therapy course (MBSS, FEES) if these are noted. See "skilled intervention" for details on today's session. SLP believes pt will cont to benefit from skilled ST targeting incr'd volume and greater usage of abdomoinal breathing in order to improve communicative effectiveness. SLP may suggest referral to pulmonologist to referring MD and/or PCP if pt appears to cont to have difficulty with breathing for speech.    Speech Therapy Frequency 2x / week    Duration  12 weeks    Treatment/Interventions Compensatory strategies;Internal/external aids;Patient/family education;SLP instruction and feedback;Cueing hierarchy;Functional tasks;Other  (comment)   HEP including loud /a/ or "hey" and everyday sentences   Potential to Michael Odonnell provided, to read conversational sentences    Consulted and Agree with Plan of Care Patient             Patient will benefit from skilled therapeutic intervention in order to improve the following deficits and impairments:   Dysarthria and anarthria    Problem List Patient Active Problem List   Diagnosis Date Noted   Chronic anticoagulation 11/10/2019   New onset atrial fibrillation ([place]) 10/19/2019   Essential hypertension 10/19/2019   Parkinson's disease ([place]) 10/19/2019   BPH (benign prostatic hyperplasia) 10/19/2019   Nausea 11/07/2017    Cap Massi ,MS, CCC-SLP  04/26/2021, 4:58 PM  [facility] [address] [place], [place], [address] Phone: [phone]   Fax:  [phone]   Name: Michael Odonnell MRN: [id number] Date of Birth: 1940-10-26

## 2021-05-01 ENCOUNTER — Other Ambulatory Visit: Payer: Self-pay

## 2021-05-01 ENCOUNTER — Ambulatory Visit: Payer: Medicare Other

## 2021-05-01 DIAGNOSIS — R471 Dysarthria and anarthria: Secondary | ICD-10-CM | POA: Diagnosis not present

## 2021-05-01 NOTE — Therapy (Signed)
[facility] [address], [place], [place], [address] Phone: [phone]   Fax:  [phone]  Speech Language Pathology Treatment  Patient Details  Name: Michael Odonnell MRN: [id number] Date of Birth: 12/11/1940 Referring Provider (SLP): Alonza Bogus, DO   Encounter Date: 05/01/2021   End of Session - 05/01/21 1046     Visit Number 6    Number of Visits 25    Date for SLP Re-Evaluation 07/11/21    SLP Start Time 0935    SLP Stop Time  6606    SLP Time Calculation (min) 37 min    Activity Tolerance Patient tolerated treatment well             Past Medical History:  Diagnosis Date   Arthritis    Colon polyps    Degenerative lumbar disc    Diverticulosis    GERD (gastroesophageal reflux disease)    Hypertension    Parkinson's disease ([employer])    Sciatica    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   Subjective Assessment - 05/01/21 0938     Subjective Pt tells SLP all meds are same as last week.    Patient is accompained by: Family member   wife   Currently in Pain? No/denies                   ADULT SLP TREATMENT - 05/01/21 0939       General Information   Behavior/Cognition Alert;Cooperative;Pleasant mood      Treatment Provided   Treatment provided Cognitive-Linquistic      Cognitive-Linquistic Treatment   Treatment focused on Dysarthria    Skilled Treatment SLP had pt work with "ha" given fact that pt's /a/ productions were forced and with hard glottal onset. Average with /ha/ was low 90s - upper 80s dB. with initial cue for good breath support. Everyday sentences with average mid 80s dB with SBA for good breath. Michael Odonnell was successful at [employer] of his message over 30 minutes of  conversation with rare nonverbal cues for loudness. His abdominal breathing (AB) with good to excellent during conversation and pt only experieinced minimal shortness of breath/ speaking on residual volume.      Assessment / Recommendations / Plan   Plan Continue with current plan of care      Progression Toward Goals   Progression toward goals Progressing toward goals                SLP Short Term Goals - 05/01/21 1047       SLP SHORT TERM GOAL #1   Title pt will produce loud /a/ or "hey!" with at least low 90s dB average over three sessions    Baseline 04-19-21, 04-24-21    Time 4    Period Weeks    Status On-going    Target Date 05/12/21      SLP SHORT TERM GOAL #2   Title pt will generate abdominal breathing 80% of the time in seated position in 3 sessions    Baseline 04-26-21, 05-01-21    Time 4    Period Weeks    Status On-going    Target  Date 05/12/21      SLP SHORT TERM GOAL #3   Title Pt will produce 16/20 sentence responses with WNL volume over 3 sessions    Baseline 04-24-21, 04-26-21    Status Achieved    Target Date 05/12/21              SLP Long Term Goals - 05/01/21 1048       SLP LONG TERM GOAL #1   Title when reading pt will demo abdominal breathing (AB) 80% of the time in 3 sessions    Status Deferred      SLP LONG TERM GOAL #2   Title Pt will produce 5 minutes simple conversation with WNL volume over three sessions    Baseline 05-01-21    Time 8    Period Weeks    Status On-going      SLP LONG TERM GOAL #3   Title pt will generate abdominal breathing 80% of the time in 5 minutes simple conversation over 2 sessions    Baseline 05-01-21    Time 8    Period Weeks    Status On-going      SLP LONG TERM GOAL #4   Title pt will increase PROM (Communication Effectiveness Survey) to 21/32 (baseline 15/32)    Time 12    Period Weeks    Status On-going      SLP LONG TERM GOAL #5   Title pt will generate average loudness of low 70s dB  in 10 minutes simple conversation over 3 sessions    Baseline 05-01-21    Time 12    Period Weeks    Status On-going      SLP LONG TERM GOAL #6   Title pt will demo abdominal breathing 80% of the time in 10 minutes simple conversation over 3 sessions    Baseline 05-01-21    Time 12    Period Weeks    Status On-going              Plan - 05/01/21 1046     Clinical Impression Statement Michael Odonnell cont to present today with 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 Pt and wife do not report overt s/s aspiration during meals today; in the future he may require objective swallow eval during this therapy course (MBSS, FEES) if these are noted. See "skilled intervention" for details on today's session. Michael Odonnell is carrying over WNL speech to conversation. SLP believes pt will cont to benefit from skilled ST targeting incr'd volume and greater usage of abdomoinal breathing in order to improve communicative effectiveness. SLP may suggest referral to pulmonologist to referring MD and/or PCP if pt appears to cont to have difficulty with breathing for speech.    Speech Therapy Frequency 2x / week    Duration 12 weeks    Treatment/Interventions Compensatory strategies;Internal/external aids;Patient/family education;SLP instruction and feedback;Cueing hierarchy;Functional tasks;Other (comment)   HEP including loud /a/ or "hey" and everyday sentences   Potential to [place] provided, to read conversational sentences    Consulted and Agree with Plan of Care Patient             Patient will benefit from skilled therapeutic intervention in order to improve the following deficits and impairments:   Dysarthria and anarthria    Problem List Patient Active Problem List   Diagnosis Date Noted   Chronic anticoagulation 11/10/2019   New onset atrial fibrillation ([place]) 10/19/2019   Essential hypertension  10/19/2019   Parkinson's disease ([place]) 10/19/2019   BPH  (benign prostatic hyperplasia) 10/19/2019   Nausea 11/07/2017    Michael Odonnell, CCC-SLP ,MS, CCC-SLP  05/01/2021, 10:49 AM  [facility] [address] [place], [place], [address] Phone: [phone]   Fax:  [phone]   Name: Michael Odonnell MRN: [id number] Date of Birth: 22-Mar-1941

## 2021-05-02 ENCOUNTER — Ambulatory Visit: Payer: Medicare Other

## 2021-05-02 DIAGNOSIS — R471 Dysarthria and anarthria: Secondary | ICD-10-CM | POA: Diagnosis not present

## 2021-05-02 NOTE — Therapy (Signed)
[facility] [address], [place], [place], [address] Phone: [phone]   Fax:  [phone]  Speech Language Pathology Treatment  Patient Details  Name: Michael Odonnell MRN: [id number] Date of Birth: [birth year] Referring Provider (SLP): Alonza Bogus, DO   Encounter Date: 05/02/2021   End of Session - 05/02/21 1251     Visit Number 7    Number of Visits 25    Date for SLP Re-Evaluation 07/11/21    SLP Start Time 0933    SLP Stop Time  4268    SLP Time Calculation (min) 42 min    Activity Tolerance Patient tolerated treatment well             Past Medical History:  Diagnosis Date   Arthritis    Colon polyps    Degenerative lumbar disc    Diverticulosis    GERD (gastroesophageal reflux disease)    Hypertension    Parkinson's disease ([place])    Sciatica    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   Subjective Assessment - 05/02/21 0935     Subjective "Are we going outside (to practice)?"    Patient is accompained by: Family member   [name]   Currently in Pain? No/denies                   ADULT SLP TREATMENT - 05/02/21 0936       General Information   Behavior/Cognition Cooperative;Alert;Pleasant mood      Treatment Provided   Treatment provided Cognitive-Linquistic      Cognitive-Linquistic Treatment   Treatment focused on Dysarthria    Skilled Treatment SLP targeted "ha" to habitualize WNL conversational speech volume. Pt's productions were low 90s when SLP cued pt for "ha" and not "huh" with initial cue for good breath support. Everyday sentences with average mid 80s dB with good breath pattern independently. Michael Odonnell was successful at incr'ing loudness to WNL in conversation PRIOR to loud "ha"  today. This continued outdoors with conversation of almost 12 minutes while walking and standing near front door of clinic. His abdominal breathing (AB) was again good to excellent during conversation and shortness of breath/ speaking on residual volume was not noted during conversation outdoors. [name] and pt both acknowledged pt was speaking with louder volume at home and was self correcting more frequently to improve his volume, when compared to pre-ST. Pt was amenable to reducing frequency to once/week due to progress.      Assessment / Recommendations / Plan   Plan Other (Comment)   reduce to once/week     Progression Toward Goals   Progression toward goals Progressing toward goals                SLP Short Term Goals - 05/02/21 1252       SLP SHORT TERM GOAL #1   Title pt will produce loud /a/ or "hey!" with at least low 90s dB average over three sessions    Baseline 04-19-21, 04-24-21    Time --    Period --    Status Achieved    Target Date 05/12/21      SLP SHORT TERM GOAL #  2   Title pt will generate abdominal breathing 80% of the time in seated position in 3 sessions    Baseline 04-26-21, 05-01-21    Time --    Period --    Status Achieved    Target Date 05/12/21      SLP SHORT TERM GOAL #3   Title Pt will produce 16/20 sentence responses with WNL volume over 3 sessions    Baseline 04-24-21, 04-26-21    Status Achieved    Target Date 05/12/21              SLP Long Term Goals - 05/02/21 1253       SLP LONG TERM GOAL #1   Title when reading pt will demo abdominal breathing (AB) 80% of the time in 3 sessions    Status Deferred      SLP LONG TERM GOAL #2   Title Pt will produce 5 minutes simple conversation with WNL volume over three sessions    Baseline 05-01-21, 05-02-21    Time 8    Period Weeks    Status On-going    Target Date 06/09/21      SLP LONG TERM GOAL #3   Title pt will generate abdominal breathing 80% of the time in 5 minutes simple  conversation over 2 sessions    Baseline 05-01-21    Time 8    Period Weeks    Status Achieved    Target Date 06/09/21      SLP LONG TERM GOAL #4   Title pt will increase PROM (Communication Effectiveness Survey) to 21/32 (baseline 15/32)    Time 12    Period Weeks    Status On-going      SLP LONG TERM GOAL #5   Title pt will generate average loudness of low 70s dB in 10 minutes simple conversation over 3 sessions    Baseline 05-01-21, 05-02-21    Time 12    Period Weeks    Status On-going    Target Date 07/11/21      SLP LONG TERM GOAL #6   Title pt will demo abdominal breathing 80% of the time in 10 minutes simple conversation over 3 sessions    Baseline 05-01-21    Time 12    Period Weeks    Status On-going    Target Date 07/11/21              Plan - 05/02/21 1251     Clinical Impression Statement Michael Odonnell cont to present today with 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 Pt and wife do not report overt s/s aspiration during meals today; in the future he may require objective swallow eval during this therapy course (MBSS, FEES) if these are noted. See "skilled intervention" for details on today's session. Michael Odonnell continues to carry over WNL speech to conversation. SLP believes pt will cont to benefit from skilled ST targeting incr'd volume and greater usage of abdomoinal breathing in order to improve communicative effectiveness. SLP may suggest referral to pulmonologist to referring MD and/or PCP if pt appears to cont to have difficulty with breathing for speech. Pt was decr'd to once/week due to progress.    Speech Therapy Frequency 2x / week    Duration 12 weeks    Treatment/Interventions Compensatory strategies;Internal/external aids;Patient/family education;SLP instruction and feedback;Cueing hierarchy;Functional tasks;Other (comment)   HEP including loud /a/ or "hey" and everyday sentences   Potential to [place]  provided, to read conversational sentences    Consulted and Agree with Plan of Care Patient             Patient will benefit from skilled therapeutic intervention in order to improve the following deficits and impairments:   Dysarthria and anarthria    Problem List Patient Active Problem List   Diagnosis Date Noted   Chronic anticoagulation 11/10/2019   New onset atrial fibrillation ([place]) 10/19/2019   Essential hypertension 10/19/2019   Parkinson's disease ([place]) 10/19/2019   BPH (benign prostatic hyperplasia) 10/19/2019   Nausea 11/07/2017    Garald Balding, [place] 05/02/2021, 12:54 PM  [facility] [address] [place], [place], [address] Phone: [phone]   Fax:  [phone]   Name: Michael Odonnell MRN: [id number] Date of Birth: June 26, 1940

## 2021-05-10 ENCOUNTER — Other Ambulatory Visit: Payer: Self-pay

## 2021-05-10 ENCOUNTER — Ambulatory Visit: Payer: Medicare Other | Admitting: Neurology

## 2021-05-10 ENCOUNTER — Ambulatory Visit: Payer: Medicare Other

## 2021-05-10 DIAGNOSIS — R471 Dysarthria and anarthria: Secondary | ICD-10-CM | POA: Diagnosis not present

## 2021-05-10 NOTE — Therapy (Addendum)
[facility] [address] [place], [place], [address] Phone: [phone]   Fax:  [phone]  Speech Language Pathology Treatment  Patient Details  Name: Michael Odonnell MRN: [id number] Date of Birth: [birth year] Referring Provider (SLP): Alonza Bogus, DO   Encounter Date: 05/10/2021   End of Session - 05/10/21 1051     Visit Number 8    Number of Visits 25    Date for SLP Re-Evaluation 07/11/21    SLP Start Time 0932    SLP Stop Time  1012    SLP Time Calculation (min) 40 min    Activity Tolerance Patient tolerated treatment well             Past Medical History:  Diagnosis Date   Arthritis    Colon polyps    Degenerative lumbar disc    Diverticulosis    GERD (gastroesophageal reflux disease)    Hypertension    Parkinson's disease ([place])    Sciatica    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   Subjective Assessment - 05/10/21 0938     Subjective "Nobody had to ask me to repeat." (over the weekend with family)    Patient is accompained by: Family member    Currently in Pain? No/denies                   ADULT SLP TREATMENT - 05/10/21 0949       General Information   Behavior/Cognition Cooperative;Alert;Pleasant mood      Treatment Provided   Treatment provided Cognitive-Linquistic      Cognitive-Linquistic Treatment   Treatment focused on Dysarthria    Skilled Treatment Michael Odonnell and Michael Odonnell asked PCP for script for PT - this is pending. Michael Odonnell tells SLP that family did not need to ask him to repeat during their visit last week/weekend, and states that this is progress when compared to pre-ST. Conversation (mostly mod -complex) for first 15 minutes was with WNL volume. Loud "ha" today (for  recalibration of conversational speech to WNL) averaged upper 80s - low 90s dB with initial cue for full breath. Everyday sentences averaged in mid 80s loudness. Conversation (simple to mod complex) after loud /ha/ and sentences was also WNL. Pt agreed that once every other week is appropriate for him from this point.      Assessment / Recommendations / Plan   Plan Continue with current plan of care      Progression Toward Goals   Progression toward goals Progressing toward goals                SLP Short Term Goals - 05/02/21 1252       SLP SHORT TERM GOAL #1   Title pt will produce loud /a/ or "hey!" with at least low 90s dB average over three sessions    Baseline 04-19-21, 04-24-21    Time --    Period --    Status Achieved    Target Date 05/12/21      SLP SHORT TERM GOAL #2   Title pt will generate abdominal breathing 80% of the time in seated position in 3 sessions    Baseline 04-26-21,  05-01-21    Time --    Period --    Status Achieved    Target Date 05/12/21      SLP SHORT TERM GOAL #3   Title Pt will produce 16/20 sentence responses with WNL volume over 3 sessions    Baseline 04-24-21, 04-26-21    Status Achieved    Target Date 05/12/21              SLP Long Term Goals - 05/10/21 1134       SLP LONG TERM GOAL #1   Title when reading pt will demo abdominal breathing (AB) 80% of the time in 3 sessions    Status Deferred      SLP LONG TERM GOAL #2   Title Pt will produce 5 minutes simple conversation with WNL volume over three sessions    Baseline 05-01-21, 05-02-21    Status Achieved      SLP LONG TERM GOAL #3   Title pt will generate abdominal breathing 80% of the time in 5 minutes simple conversation over 2 sessions    Baseline 05-01-21    Time 8    Period Weeks    Status Achieved      SLP LONG TERM GOAL #4   Title pt will increase PROM (Communication Effectiveness Survey) to 21/32 (baseline 15/32)    Time 12    Period Weeks    Status  On-going    Target Date 07/11/21      SLP LONG TERM GOAL #5   Title pt will generate average loudness of low 70s dB in 10 minutes simple conversation over 4 sessions    Baseline 05-01-21, 05-02-21, 05-10-21    Time 12    Period Weeks    Status Revised    Target Date 07/11/21      SLP LONG TERM GOAL #6   Title pt will demo abdominal breathing 80% of the time in 10 minutes simple conversation over 3 sessions    Baseline 05-01-21, 05-10-21    Time 12    Period Weeks    Status On-going    Target Date 07/11/21            Plan - 05/10/21        Clinical Impression Statement Michael Odonnell cont to present today with 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 Pt and wife do not report overt s/s aspiration during meals today; in the future he may require objective swallow eval during this therapy course (MBSS, FEES) if these are noted. See "skilled intervention" for details on today's session. Michael Odonnell continues to carry over WNL speech to conversation. SLP believes pt will cont to benefit from skilled ST targeting incr'd volume and greater usage of abdomoinal breathing in order to improve communicative effectiveness. SLP may suggest referral to pulmonologist to referring MD and/or PCP if pt appears to cont to have difficulty with breathing for speech. Pt may be reduced in frequency today to once every other week, or in next 1-2 sessions due to progress.    Speech Therapy Frequency 1x / week     Duration 12 weeks     Treatment/Interventions Compensatory strategies;Internal/external aids;Patient/family education;SLP instruction and feedback;Cueing hierarchy;Functional tasks;Other (comment)   HEP including loud /a/ or "hey" and everyday sentences    Potential to [place] provided, to read      Patient will benefit from skilled therapeutic intervention in order to improve the following deficits  and impairments:   Dysarthria and  anarthria    Problem List Patient Active Problem List   Diagnosis Date Noted   Chronic anticoagulation 11/10/2019   New onset atrial fibrillation ([place]) 10/19/2019   Essential hypertension 10/19/2019   Parkinson's disease ([place]) 10/19/2019   BPH (benign prostatic hyperplasia) 10/19/2019   Nausea 11/07/2017    Michael Odonnell, [place] 05/10/2021, 11:36 AM  [facility] [address] [place], [place], [address] Phone: [phone]   Fax:  [phone]   Name: Michael Odonnell MRN: [id number] Date of Birth: February 04, 1941

## 2021-05-16 ENCOUNTER — Other Ambulatory Visit: Payer: Self-pay | Admitting: Neurology

## 2021-05-16 DIAGNOSIS — G20 Parkinson's disease: Secondary | ICD-10-CM

## 2021-05-24 ENCOUNTER — Other Ambulatory Visit: Payer: Self-pay

## 2021-05-24 ENCOUNTER — Ambulatory Visit: Payer: Medicare Other | Attending: Neurology

## 2021-05-24 DIAGNOSIS — R471 Dysarthria and anarthria: Secondary | ICD-10-CM | POA: Diagnosis not present

## 2021-05-24 NOTE — Patient Instructions (Signed)
°  Continue loud "haaaaaaaa" and everyday sentences twice a day

## 2021-05-24 NOTE — Therapy (Signed)
[facility] [address] [place], [place], [address] Phone: [phone]   Fax:  [phone]  Speech Language Pathology Treatment/Discharge summary  Patient Details  Name: Michael Odonnell MRN: [id number] Date of Birth: 1940/07/30 Referring Provider (SLP): Alonza Bogus, DO   Encounter Date: 05/24/2021   End of Session - 05/24/21 1312     Visit Number 9    Number of Visits 25    Date for SLP Re-Evaluation 07/11/21    SLP Start Time 0934    SLP Stop Time  1015    SLP Time Calculation (min) 41 min    Activity Tolerance Patient tolerated treatment well             Past Medical History:  Diagnosis Date   Arthritis    Colon polyps    Degenerative lumbar disc    Diverticulosis    GERD (gastroesophageal reflux disease)    Hypertension    Parkinson's disease ([place])    Sciatica    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  SPEECH THERAPY DISCHARGE SUMMARY  Visits from Start of Care: 9  Current functional level related to goals / functional outcomes: See below. Pt has made functional gains in speech loudness. He rates himself as having a higher speech related QOL post-ST.   Remaining deficits: None re: speech production.   Education / Equipment: Continue HEP after d/c, abdominal breathing.   Patient agrees to discharge. Patient goals were met. Patient is being discharged due to meeting the stated rehab goals..       Subjective Assessment - 05/24/21 0938     Subjective Pt states he has maintained frequency of loudness since previous session.    Patient is accompained by: Family member   joanne   Currently in Pain? No/denies                   ADULT SLP TREATMENT - 05/24/21 0939       General  Information   Behavior/Cognition Cooperative;Alert;Pleasant mood      Treatment Provided   Treatment provided Cognitive-Linquistic      Cognitive-Linquistic Treatment   Treatment focused on Dysarthria    Skilled Treatment Loud "ha" today (for recalibration of conversational speech to WNL) averaged mid 80s dB. Pt endorsed he had not completed HEP every  day since last session - SLP reiterated strongly that this was important to do 5 loud "ha" and everyday sentences BID. Everyday sentences averaged in mid 80s loudness. Conversation (simple to mod complex) after loud /ha/ and sentences remained WNL from last session. Michael Odonnell states she is asking pt to repeat at same frequency as previous session. Pt is satisfied with his ST course and agrees d/c is appropriate today. Michael Odonnell filled out his Commncation Effectiveness Survey and scored 29/32, meeting his LTGs.      Assessment / Recommendations / Plan   Plan Discharge SLP treatment due to (comment)   goals met     Progression Toward Goals   Progression toward goals --   see goal summary - d/c day             SLP Education - 05/24/21 1312  Education Details cont HEP every day after d/c    Person(s) Educated Patient;Spouse    Methods Explanation    Comprehension Verbalized understanding              SLP Short Term Goals - 05/02/21 1252       SLP SHORT TERM GOAL #1   Title pt will produce loud /a/ or "hey!" with at least low 90s dB average over three sessions    Baseline 04-19-21, 04-24-21    Time --    Period --    Status Achieved    Target Date 05/12/21      SLP SHORT TERM GOAL #2   Title pt will generate abdominal breathing 80% of the time in seated position in 3 sessions    Baseline 04-26-21, 05-01-21    Time --    Period --    Status Achieved    Target Date 05/12/21      SLP SHORT TERM GOAL #3   Title Pt will produce 16/20 sentence responses with WNL volume over 3 sessions    Baseline 04-24-21, 04-26-21    Status  Achieved    Target Date 05/12/21              SLP Long Term Goals - 05/24/21 1008       SLP LONG TERM GOAL #1   Title when reading pt will demo abdominal breathing (AB) 80% of the time in 3 sessions    Time 8    Period Weeks    Status Deferred      SLP LONG TERM GOAL #2   Title Pt will produce 5 minutes simple conversation with WNL volume over three sessions    Baseline 05-01-21, 05-02-21    Status Achieved      SLP LONG TERM GOAL #3   Title pt will generate abdominal breathing 80% of the time in 5 minutes simple conversation over 2 sessions    Baseline 05-01-21    Time --    Period --    Status Achieved      SLP LONG TERM GOAL #4   Title pt will increase PROM (Communication Effectiveness Survey) to 21/32 (baseline 15/32)    Time --    Period --    Status Achieved      SLP LONG TERM GOAL #5   Title pt will generate average loudness of low 70s dB in 10 minutes simple conversation over 4 sessions    Baseline 05-01-21, 05-02-21, 05-10-21    Status Achieved      SLP LONG TERM GOAL #6   Title pt will demo abdominal breathing 80% of the time in 10 minutes simple conversation over 3 sessions    Baseline 05-01-21, 05-10-21    Status Achieved              Plan - 05/24/21 1313     Clinical Impression Statement Michael Odonnell cont to present today with 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 Pt and wife do not report overt s/s aspiration during meals today. Pt agrees he is ready for d/c today.    Treatment/Interventions Compensatory strategies;Internal/external aids;Patient/family education;SLP instruction and feedback;Cueing hierarchy;Functional tasks;Other (comment)   HEP including loud /a/ or "hey" and everyday sentences   Potential to [place] provided, to read conversational sentences    Consulted and Agree with Plan of Care Patient             Patient  will benefit from skilled therapeutic intervention in  order to improve the following deficits and impairments:   Dysarthria and anarthria    Problem List Patient Active Problem List   Diagnosis Date Noted   Chronic anticoagulation 11/10/2019   New onset atrial fibrillation ([place]) 10/19/2019   Essential hypertension 10/19/2019   Parkinson's disease ([place]) 10/19/2019   BPH (benign prostatic hyperplasia) 10/19/2019   Nausea 11/07/2017    Michael Odonnell, [place] 05/24/2021, 1:14 PM  [facility] [address], [place], [place], [address] Phone: [phone]   Fax:  [phone]   Name: Michael Odonnell MRN: [id number] Date of Birth: 05-31-1941

## 2021-08-21 ENCOUNTER — Ambulatory Visit: Payer: Medicare Other | Admitting: Neurology

## 2021-08-21 ENCOUNTER — Other Ambulatory Visit: Payer: Self-pay

## 2021-08-21 ENCOUNTER — Encounter: Payer: Self-pay | Admitting: Neurology

## 2021-08-21 VITALS — BP 130/79 | HR 65 | Ht 69.0 in | Wt 173.0 lb

## 2021-08-21 DIAGNOSIS — G20 Parkinson's disease: Secondary | ICD-10-CM | POA: Diagnosis not present

## 2021-08-21 NOTE — Patient Instructions (Signed)
Local and Online Resources for Power over Parkinson's Group March 2023  LOCAL  PARKINSON'S GROUPS  Power over Parkinson's Group :   Power Over Parkinson's Patient Education Group will be Wednesday, March 8th-*Hybrid meting*- in person at [place] location and via [facility] at 2:00 pm.   Upcoming Power over Parkinson's Meetings:  2nd Wednesdays of the month at 2 pm:  March 8th, April 12th Contact Amy Marriott at [email or web address] if interested in participating in this group Parkinson's Care Partners Group:    3rd Mondays, Contact Misty Paladino Atypical Parkinsonian Patient Group:   4th Wednesdays, Contact Misty Paladino If you are interested in participating in these groups with Misty, please contact her directly for how to join those meetings.  Her contact information is [email or web address].    LOCAL EVENTS AND NEW OFFERINGS Parkinson's Wellness Event:  Pottery Night at [facility] [place].  Friday, March 10th 5:30-7:30 pm.  Sponsored by [employer] [place].  FREE event for people with Parkinson's and care partners.  RSVP to [facility] at [email or web address] Dine out at [facility].  Celebrate Parkinson's disease Awareness Month and Support the Parkinson's Movement Disorder Fund.   Wednesday, April 19th 4-6 pm at [place], [employer].  (Give receipt to cashier and 20% will be donated) Parkinson's T-shirts for sale!  Designed by a local group member, with funds going to [place].  $20.00  [place] to purchase (see email above) New PWR! Moves Class offering at [employer]!  Fridays 1-2 pm in March (likely to switch days and times after March).  Come try it out and see if PWR! Moves is a good fit for your exercise routine!  Contact Amy Marriott for details:  [email or web address] Hamil-Kerr Challenge Bike, Run, Walk for PD, PSP, MSA.    Saturday, April 8th at 8 am.  [place]   Proceeds go to offset costs of exercise programs locally.  To Register, visit website:  www.[email or web address]  [place]:  www.parkinson.org PD Health at Home continues:  Mindfulness Mondays, Expert Briefing Tuesdays, Wellness Wednesdays, Take Time Thursdays, Fitness Fridays  Upcoming Education:  Parkinson's and Medications:  [employer].  Wednesday, March 8th at 1:00 pm. Freezing and Fall Prevention in Parkinson's.  Wednesday, April 12th at 1:00 pm Register for expert briefings Cytogeneticist) at WatchCalls.si [place] Chapter Parkinson's Symposium: Cognition Changes- a free in-person ([place], [place]) and online [employer]) event for people with Parkinson's and their loved ones. During this event we will explore new treatments and practical strategies for addressing these changes.  Saturday, April 1st, 10 am-1 pm.  Register at [employer].[email or web address]/ or contact [place] at [phone] or [email or web address].  Please check out their website to sign up for emails and see their full online offerings  [place]:  [email or web address]  Third Thursday Webinars:  On the third Thursday of every month at 12 p.m. ET, join our free live webinars to learn about various aspects of living with Parkinson's disease and our work to speed medical breakthroughs. Upcoming Webinar:  The Power of Women in Philanthropy.  Tues, March 28th at  12 pm. Check out additional information on their website to see their full online offerings  [employer]:  www.[email or web address] Upcoming Webinar:   Stay tuned Webinar Series:  Living with Parkinson's Meetup.   Third Thursdays of each month at 3 pm Care Partner Monthly Meetup.  With Robin Searing [place].  First Tuesday of each month, 2 pm Check out additional information  to Live Well Today on their  website  Parkinson and Movement Disorders (PMD) Alliance:  www.pmdalliance.org [employer] Online:  Online Education Events Sign up for emails, which are sent weekly to give you updates on programming and online offerings  Parkinson's Association of the [place]:  www.parkinsonassociation.org Information on online support groups, education events, and online exercises including Yoga, Parkinson's exercises and more-LOTS of information on links to PD resources and online events Virtual Support Group through Parkinson's Association of the [place]; next one is scheduled for Wednesday, *April 5th at 2 pm. *March meeting has been cancelled. (These are typically scheduled for the 1st Wednesday of the month at 2 pm).  Visit website for details. MOVEMENT AND EXERCISE OPPORTUNITIES Parkinson's DRUMMING Classes/Music Therapy with Doylene Canning:  This is a returning class and it's FREE!  2nd Mondays, continuing March 13th , 11:00 at the [place].  Contact *Misty Taylor-Paladino at [email or web address] or Doylene Canning at [phone] or [email or web address]  PWR! Moves Classes at [place].  Wednesdays 10 and 11 am.   NEW PWR! Moves Class offering at [employer].  Fridays 1-2 pm.  Contact Amy Marriott, PT [email or web address] if interested. Here is a link to the PWR!Moves classes on Zoom from [place] - Daily Mon-Sat at 10:00. Via Zoom, FREE and open to all.  There is also a link below via Facebook if you use that platform. AptDealers.si [email or web address]  Parkinson's Wellness Recovery (PWR! Moves)  www.[email or web address] Info on the PWR! Virtual Experience:  You will have access to our expertise through  self-assessment, guided plans that start with the PD-specific fundamentals, educational content, tips, Q&A with an expert, and a growing Art therapist of PD-specific pre-recorded and live exercise classes of varying types and intensity - both physical and cognitive! If that is not enough, we offer 1:1 wellness consultations (in-person or virtual) to personalize your PWR! Research scientist (medical).  [place] Fridays:  As part of the PD Health @ Home program, this free video series focuses each week on one aspect of fitness designed to support people living with Parkinson's.  These weekly videos highlight the [place] recent fitness guidelines for people with Parkinson's disease. ModemGamers.si Dance for PD website is offering free, live-stream classes throughout the week, as well as links to [employer] of classes:  [email or web address].[email or web address]/ Dance for Parkinson's in-person class.  February 1-April 26, Wednesdays 4-5 pm.  Free class for people with Parkinson's disease, at [address], [place], [place].  Contact [phone] or [email or web address] to register Virtual dance and Pilates for Parkinson's classes: Click on the Community Tab> Parkinson's Movement Initiative Tab.  To register for classes and for more information, visit www.[email or web address] and click the community tab.  [employer] Parkinson's Cycling Classes  [employer]:  Thursdays @ Noon-Live classes at [employer] ([employer] at [email or web address] or [phone]) [employer]: Virtual Classes Mondays and Thursdays Jeanette Caprice classes Tuesday, Wednesday and Thursday (contact [place] at [email or web address]  or [phone]) [place] Varied levels of classes are offered Mondays, Tuesdays and Thursdays at [employer].  Stretching with Verdis Frederickson weekly class is also offered for people with  Parkinson's To observe a class or for more information, call [phone] or email Hezzie Bump at [email or web address] .[email or web address] ADDITIONAL SUPPORT AND RESOURCES Well[employer]Spring [employer]:Online Caregiver Education Opportunities:  www.well-springsolutions.org/caregiver-education/caregiver-support-group.  You may also contact Vickki Muff at [email or web address] or [phone].    Well-Spring Navigator:  [phone] program, a free service to help individuals and families through the journey  of determining care for older adults.  The Navigator is a Education officer, museum, Arnell Asal, who will speak with a prospective client and/or loved ones to provide an assessment of the situation and a set of recommendations for a personalized care plan -- all free of charge, and whether Well-Spring [employer] offers the needed service or not. If the need is not a service we provide, we are well-connected with reputable programs in town that we can refer you to.  www.well-springsolutions.org or to speak with the Navigator, call [phone]

## 2021-08-21 NOTE — Progress Notes (Signed)
? ? ?Assessment/Plan:  ? ?1.  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 -continue carbidopa/levodopa 25/100, 1 tablet at 6 AM/10 AM/2 PM/6 PM ? -We discussed that it used to be thought that levodopa would increase risk of melanoma but now it is believed that Parkinsons itself likely increases risk of melanoma. he follows regularly with dermatology and saw derm last month.  Pt with hx of BCC ? ? ? ?2.  Sialorrhea ? -Not bad enough for intervention. ? ?3.  PAF ? -On Cardizem and Eliquis.  Following with cardiology. ? ? ? ? ?Subjective:  ? ?Michael Odonnell was seen today in follow up for Parkinsons disease.  My previous records were reviewed prior to todays visit as well as outside records available to me. Wife with patient and supplements hx.  Pt denies falls.  Exercising daily.  Has attended speech therapy since our last visit.  No lightheadedness.  Having dry eye - seeing Dr. Katy Fitch.  Using systane.  Those notes have been reviewed. ? ?Current prescribed movement disorder medications: ?carbidopa/levodopa 25/100, 6am/10am/2pm/6pm ? ? ? ?ALLERGIES:   ?Allergies  ?Allergen Reactions  ? Codeine Nausea And Vomiting  ? Doxycycline Nausea Only  ? Metronidazole Nausea Only  ? ? ?CURRENT MEDICATIONS:  ?Outpatient Encounter Medications as of 08/21/2021  ?Medication Sig  ? acetaminophen (TYLENOL) 500 MG tablet Take 1,000 mg by mouth every 6 (six) hours as needed for mild pain or headache.   ? apixaban (ELIQUIS) 5 MG TABS tablet TAKE 1 TABLET(5 MG) BY MOUTH TWICE DAILY  ? Calcium Carbonate Antacid (TUMS PO) Take 2 tablets by mouth daily as needed (heartburn).   ? carbidopa-levodopa (SINEMET IR) 25-100 MG tablet TAKE 1 TABLET BY MOUTH AT 6 AM, 10 AM, 2 PM AND 6 PM  ? diltiazem (TIAZAC) 360 MG 24 hr capsule Take 1 capsule (360 mg total) by mouth daily.  ? Magnesium 400 MG CAPS Take 1 tablet by mouth daily.  ? Multiple Vitamins-Minerals (CENTRUM SILVER 50+MEN) TABS Take by mouth daily.  ? niacin 500 MG tablet Take  500 mg by mouth 2 (two) times daily.  ? perindopril (ACEON) 8 MG tablet Take 8 mg by mouth daily.  ? polyethylene glycol (MIRALAX / GLYCOLAX) packet Take 17 g by mouth daily as needed for mild constipation.   ? tamsulosin (FLOMAX) 0.4 MG CAPS capsule Take 1 capsule (0.4 mg total) by mouth daily.  ? ?No facility-administered encounter medications on file as of 08/21/2021.  ? ? ?Objective:  ? ?PHYSICAL EXAMINATION:   ? ?VITALS:   ?Vitals:  ? 08/21/21 0831  ?BP: 130/79  ?Pulse: 65  ?SpO2: 98%  ?Weight: 173 lb (78.5 kg)  ?Height: '5\' 9"'$  (1.753 m)  ? ? ? ? ?GEN:  The patient appears stated age and is in NAD. ?HEENT:  Normocephalic, atraumatic.  The mucous membranes are moist. The superficial temporal arteries are without ropiness or tenderness. ?CV:  RRR ?Lungs:  CTAB ?Neck/HEME:  There are no carotid bruits bilaterally. ? ?Neurological examination: ? ?Orientation: The patient is alert and oriented x3. ?Cranial nerves: There is good facial symmetry with facial hypomimia. The speech is fluent and clear. Soft palate rises symmetrically and there is no tongue deviation. Hearing is intact to conversational tone. ?Sensation: Sensation is intact to light touch throughout ?Motor: Strength is at least antigravity x4. ? ?Movement examination: ?Tone: There is nl tone in the UE/LE ?Abnormal movements: there is bilateral UE rest tremor, R>L.   ?Coordination:  There is no decremation with  RAM's, with any form of RAMS, including alternating supination and pronation of the forearm, hand opening and closing, finger taps, heel taps and toe taps. ?Gait and Station: The patient has no difficulty arising out of a deep-seated chair without the use of the hands. The patient's stride length is good.  ? ?I have reviewed and interpreted the following labs independently ? ?  Chemistry   ?   ?Component Value Date/Time  ? NA 138 09/09/2020 1114  ? K 4.6 09/09/2020 1114  ? CL 99 09/09/2020 1114  ? CO2 25 09/09/2020 1114  ? BUN 25 09/09/2020 1114  ?  CREATININE 1.33 (H) 09/09/2020 1114  ?    ?Component Value Date/Time  ? CALCIUM 10.0 09/09/2020 1114  ? ALKPHOS 64 10/30/2019 1212  ? AST 21 10/30/2019 1212  ? ALT 18 10/30/2019 1212  ? BILITOT 1.3 (H) 10/30/2019 1212  ?  ? ? ? ?Lab Results  ?Component Value Date  ? WBC 8.5 09/09/2020  ? HGB 14.1 09/09/2020  ? HCT 40.9 09/09/2020  ? MCV 88 09/09/2020  ? PLT 239 09/09/2020  ? ? ?Lab Results  ?Component Value Date  ? TSH 1.031 10/19/2019  ? ? ? ?Total time spent on today's visit was 25 minutes, including both face-to-face time and nonface-to-face time.  Time included that spent on review of records (prior notes available to me/labs/imaging if pertinent), discussing treatment and goals, answering patient's questions and coordinating care. ? ?Cc:  Reynold Bowen, MD ? ?

## 2021-08-24 ENCOUNTER — Ambulatory Visit: Payer: Self-pay | Admitting: Neurology

## 2021-08-31 ENCOUNTER — Ambulatory Visit: Payer: Self-pay | Admitting: Neurology

## 2021-09-08 ENCOUNTER — Other Ambulatory Visit: Payer: Self-pay | Admitting: Cardiology

## 2021-09-08 ENCOUNTER — Other Ambulatory Visit: Payer: Self-pay | Admitting: Neurology

## 2021-09-08 DIAGNOSIS — G20 Parkinson's disease: Secondary | ICD-10-CM

## 2021-09-08 NOTE — Telephone Encounter (Signed)
Prescription refill request for Eliquis received. ?Indication:Afib ?Last office visit:4/22 ?Scr:1.3 ?Age: 81 ?Weight:78.5 kg ? ?Prescription refilled ? ?

## 2021-09-19 ENCOUNTER — Ambulatory Visit: Payer: Medicare Other | Admitting: Neurology

## 2021-11-09 ENCOUNTER — Telehealth: Payer: Self-pay | Admitting: Neurology

## 2021-11-09 NOTE — Telephone Encounter (Signed)
Called and spoke to patients wife and informed her that as previously discussed with Dr. Carles Collet patient has levodopa resistant tremor.  That means that the levodopa helps for the slowness and stiffness of Parkinsons Disease but not the tremor.  Unfortunately, without surgery there isn't a lot to do.  Other meds likely will cause him more harm than good. Patient wife verbalized understanding and will keep him on the current dose. Patients wife had no further questions or concerns.

## 2021-11-09 NOTE — Telephone Encounter (Signed)
Patient wife called and states that the patient tremors are bad and he is on the Carbidopa levodopa 4 times a day. They use [facility] mail service for that RX. She wants to know if the medication should be adjusted  Please call

## 2021-12-20 NOTE — Progress Notes (Signed)
HPI: Follow-up atrial fibrillation.  Admitted May 2021 with atrial fibrillation.  Echocardiogram showed ejection fraction 65 to 70%, mild left ventricular hypertrophy.  TSH 1.031.  Since last seen his increased dyspnea, chest pain, palpitations or syncope.  2 brief episodes of atrial fibrillation for approximately 30 minutes each.  Current Outpatient Medications  Medication Sig Dispense Refill   acetaminophen (TYLENOL) 500 MG tablet Take 1,000 mg by mouth every 6 (six) hours as needed for mild pain or headache.      Calcium Carbonate Antacid (TUMS PO) Take 2 tablets by mouth daily as needed (heartburn).      carbidopa-levodopa (SINEMET IR) 25-100 MG tablet TAKE 1 TABLET BY MOUTH AT 6 AM,  10 AM, 2 PM AND 6 PM 360 tablet 1   diltiazem (TIAZAC) 360 MG 24 hr capsule TAKE 1 CAPSULE BY MOUTH  DAILY 90 capsule 3   ELIQUIS 5 MG TABS tablet TAKE 1 TABLET BY MOUTH  TWICE DAILY 180 tablet 3   erythromycin ophthalmic ointment SMARTSIG:0.5 Inch(es) In Eye(s) 3 Times Daily     Magnesium 400 MG CAPS Take 1 tablet by mouth daily.     Multiple Vitamins-Minerals (CENTRUM SILVER 50+MEN) TABS Take by mouth daily.     niacin 500 MG tablet Take 500 mg by mouth 2 (two) times daily.     perindopril (ACEON) 8 MG tablet Take 8 mg by mouth daily.     polyethylene glycol (MIRALAX / GLYCOLAX) packet Take 17 g by mouth daily as needed for mild constipation.      tamsulosin (FLOMAX) 0.4 MG CAPS capsule Take 1 capsule (0.4 mg total) by mouth daily. 30 capsule 0   No current facility-administered medications for this visit.     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    Tremor     Past Surgical History:  Procedure Laterality Date   CLEFT LIP REPAIR     CLEFT PALATE REPAIR      SKIN GRAFT FULL THICKNESS LEG  12/2018    Social History   Socioeconomic History   Marital status: Married    Spouse name: Not on file   Number of children: 2   Years of education: Not on file   Highest education level: Bachelor's degree (e.g., BA, AB, BS)  Occupational History   Occupation: retired    Comment: Musician - [employer]  Tobacco Use   Smoking status: Former    Types: Cigarettes    Quit date: 12/20/2012    Years since quitting: 9.0   Smokeless tobacco: Never  Vaping Use   Vaping Use: Never used  Substance and Sexual Activity   Alcohol use: No   Drug use: No   Sexual activity: Not Currently  Other Topics Concern   Not on file  Social History Narrative   Not on file   Social Determinants of Health   Financial Resource Strain: Not on file  Food Insecurity: Not on file  Transportation Needs: Not on file  Physical Activity: Not on file  Stress: Not on file  Social Connections: Not on file  Intimate Partner Violence: Not on file    Family History  Problem  Relation Age of Onset   Heart attack Mother 18   Heart disease Sister    Melanoma Sister    Heart attack Brother    Healthy Daughter    Healthy Daughter    Stomach cancer Neg Hx    Colon cancer Neg Hx     ROS: no fevers or chills, productive cough, hemoptysis, dysphasia, odynophagia, melena, hematochezia, dysuria, hematuria, rash, seizure activity, orthopnea, PND, pedal edema, claudication. Remaining systems are negative.  Physical Exam: Well-developed well-nourished in no acute distress.  Skin is warm and dry.  HEENT is normal.  Neck is supple.  Chest is clear to auscultation with normal expansion.  Cardiovascular exam is regular rate and rhythm.  Abdominal exam nontender or distended. No masses palpated. Extremities show no edema. neuro consistent with Parkinson's; resting tremor  ECG-normal sinus rhythm at a rate of 68, left axis deviation, no ST changes.   Personally reviewed  A/P  1 paroxysmal atrial fibrillation-patient remains in sinus rhythm.  Continue Cardizem and apixaban at present dose.  Note he states he had 2 brief episodes of atrial fibrillation in the past year.  If they become more frequent can consider addition of antiarrhythmic.  Laboratories June 2023 from primary care's office personally reviewed.  Creatinine 1.2, sodium 141, potassium 4.3, normal liver functions, hemoglobin 14.4.  2 hypertension-blood pressure controlled.  Continue present medical regimen.  3 history of Parkinson's-managed by primary care.  Note he fell once since last office visit but this is unusual.  If falls become more frequent would need to consider discontinuing anticoagulation (question referral for watchman).  Kirk Ruths, MD

## 2022-01-01 ENCOUNTER — Telehealth: Payer: Self-pay | Admitting: Neurology

## 2022-01-01 NOTE — Telephone Encounter (Signed)
Patient's wife called and said the patient has been having increased nausea for the last few months.  He has been trying to manage his nausea with with ginger ale.  She said Dr. Carles Collet previously prescribed Zophran for him 3 times a day and she'd like a new prescription for that if Dr. Carles Collet is okay with it.  [facility] on [address]

## 2022-01-01 NOTE — Telephone Encounter (Signed)
Patients wife said they do not see this DR any longer. She was hoping you might give patient a small prescription to help with the nausea

## 2022-01-02 ENCOUNTER — Ambulatory Visit: Payer: Medicare Other | Admitting: Cardiology

## 2022-01-02 ENCOUNTER — Encounter: Payer: Self-pay | Admitting: Cardiology

## 2022-01-02 VITALS — BP 115/62 | HR 68 | Ht 69.5 in | Wt 163.2 lb

## 2022-01-02 DIAGNOSIS — I480 Paroxysmal atrial fibrillation: Secondary | ICD-10-CM | POA: Diagnosis not present

## 2022-01-02 DIAGNOSIS — I10 Essential (primary) hypertension: Secondary | ICD-10-CM | POA: Diagnosis not present

## 2022-01-02 NOTE — Addendum Note (Signed)
Addended by: Dionicio Stall E on: 01/02/2022 01:09 PM   Modules accepted: Orders

## 2022-01-02 NOTE — Telephone Encounter (Signed)
Called patient and left message that Dr. Carles Collet would not be able to prescribe this medication but needs to reach out to patients PCP in order to get the medication refilled

## 2022-01-02 NOTE — Patient Instructions (Signed)

## 2022-01-10 ENCOUNTER — Other Ambulatory Visit: Payer: Self-pay | Admitting: Neurology

## 2022-01-10 DIAGNOSIS — G20C Parkinsonism, unspecified: Secondary | ICD-10-CM

## 2022-01-10 DIAGNOSIS — G20 Parkinson's disease: Secondary | ICD-10-CM

## 2022-01-10 DIAGNOSIS — G20A1 Parkinson's disease without dyskinesia, without mention of fluctuations: Secondary | ICD-10-CM

## 2022-02-23 NOTE — Progress Notes (Unsigned)
Assessment/Plan:   1.  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  He will likely shake no matter what the Parkinsons Disease medication we use. - he follows regularly with dermatology and saw derm last month.  Pt with hx of BCC  2.  Sialorrhea  -Not bad enough for intervention.  3.  PAF  -On Cardizem and Eliquis.  Following with cardiology.  4.  Nausea, intermittent  -seems unrelated to Parkinsons Disease  -Tums/pepcid seems to help  -discussed may need to get back to GI.  Used to f/u with Dr. Henrene Pastor  -did not recommend regular phenergan as can make Parkinsons Disease sx's worse.  Zofran is ok     Subjective:   Michael Odonnell was seen today in follow up for Parkinsons disease.  My previous records were reviewed prior to todays visit as well as outside records available to me. Wife with patient and supplements hx. called since last visit about his tremor.  Discussed again that he has levodopa resistant tremor and what that meant.  We did not change medication for this.  They did call me about a prescription for Zofran that they were prescribed previously by their GI doctor, but I did tell them that they would need to call primary care about this.  They did and phenergan recommended but pt not taking and he prefers tums/pepcid.  He only takes occasionally.  He did see Dr. Stanford Breed at the end of July who noted that if falls increased, he would consider a Watchman device over anticoagulation.  He is having some drooling but not bad enough to require botox.  He is exercising some.    Current prescribed movement disorder medications: carbidopa/levodopa 25/100, 6am/10am/2pm/6pm    ALLERGIES:   Allergies  Allergen Reactions   Codeine Nausea And Vomiting   Doxycycline Nausea Only   Metronidazole Nausea Only     CURRENT MEDICATIONS:  Outpatient Encounter Medications as of 02/27/2022  Medication Sig   acetaminophen (TYLENOL) 500 MG tablet Take 1,000 mg by mouth every 6 (six) hours as needed for mild pain or headache.    Calcium Carbonate Antacid (TUMS PO) Take 2 tablets by mouth daily as needed (heartburn).    carbidopa-levodopa (SINEMET IR) 25-100 MG tablet TAKE 1 TABLET BY MOUTH AT 6 AM,  10 AM, 2 PM AND 6 PM   diltiazem (TIAZAC) 360 MG 24 hr capsule TAKE 1 CAPSULE BY MOUTH  DAILY   ELIQUIS 5 MG TABS tablet TAKE 1 TABLET BY MOUTH  TWICE DAILY   erythromycin ophthalmic ointment SMARTSIG:0.5 Inch(es) In Eye(s) 3 Times Daily   Magnesium 400 MG CAPS Take 1 tablet by mouth daily.   Multiple Vitamins-Minerals (CENTRUM SILVER 50+MEN) TABS Take by mouth daily.   niacin 500 MG tablet Take 500 mg by mouth 2 (two) times daily.   perindopril (ACEON) 8 MG tablet Take 8 mg by mouth daily.   polyethylene glycol (MIRALAX / GLYCOLAX) packet Take 17 g by mouth daily as needed for mild constipation.    tamsulosin (FLOMAX) 0.4 MG CAPS capsule Take 1 capsule (0.4 mg total) by mouth daily.   No facility-administered encounter medications on file as of 02/27/2022.    Objective:   PHYSICAL EXAMINATION:    VITALS:   There were no vitals filed for this visit.     GEN:  The patient appears  stated age and is in NAD. HEENT:  Normocephalic, atraumatic.  The mucous membranes are moist. The superficial temporal arteries are without ropiness or tenderness. CV:  RRR Lungs:  CTAB Neck/HEME:  There are no carotid bruits bilaterally.  Neurological examination:  Orientation: The patient is alert and oriented x3. Cranial nerves: There is good facial symmetry with facial hypomimia. Lips are parted.  The speech is fluent and clear. Soft palate rises symmetrically and there is no tongue deviation. Hearing is intact to conversational tone. Sensation: Sensation is intact to light touch throughout Motor: Strength is at  least antigravity x4.  Movement examination: Tone: There is nl tone in the UE/LE Abnormal movements: there is bilateral UE rest tremor, R>L.   Coordination:  There is no decremation with RAM's, with any form of RAMS, including alternating supination and pronation of the forearm, hand opening and closing, finger taps, heel taps and toe taps. Gait and Station: The patient has no difficulty arising out of a deep-seated chair without the use of the hands. The patient's stride length is good.   I have reviewed and interpreted the following labs independently    Chemistry      Component Value Date/Time   NA 138 09/09/2020 1114   K 4.6 09/09/2020 1114   CL 99 09/09/2020 1114   CO2 25 09/09/2020 1114   BUN 25 09/09/2020 1114   CREATININE 1.33 (H) 09/09/2020 1114      Component Value Date/Time   CALCIUM 10.0 09/09/2020 1114   ALKPHOS 64 10/30/2019 1212   AST 21 10/30/2019 1212   ALT 18 10/30/2019 1212   BILITOT 1.3 (H) 10/30/2019 1212       Lab Results  Component Value Date   WBC 8.5 09/09/2020   HGB 14.1 09/09/2020   HCT 40.9 09/09/2020   MCV 88 09/09/2020   PLT 239 09/09/2020    Lab Results  Component Value Date   TSH 1.031 10/19/2019     Total time spent on today's visit was 21 minutes, including both face-to-face time and nonface-to-face time.  Time included that spent on review of records (prior notes available to me/labs/imaging if pertinent), discussing treatment and goals, answering patient's questions and coordinating care.  Cc:  Reynold Bowen, MD

## 2022-02-27 ENCOUNTER — Encounter: Payer: Self-pay | Admitting: Neurology

## 2022-02-27 ENCOUNTER — Ambulatory Visit: Payer: Medicare Other | Admitting: Neurology

## 2022-02-27 VITALS — BP 115/72 | HR 72 | Ht 69.0 in | Wt 161.6 lb

## 2022-02-27 DIAGNOSIS — G20 Parkinson's disease: Secondary | ICD-10-CM

## 2022-02-27 DIAGNOSIS — R110 Nausea: Secondary | ICD-10-CM | POA: Diagnosis not present

## 2022-02-27 NOTE — Patient Instructions (Signed)
Local and Online Resources for Power over Parkinson's Group September 2023  LOCAL Portal PARKINSON'S GROUPS  Power over Parkinson's Group:   Power Over Parkinson's Patient Education Group will be Wednesday, September 13th-*Hybrid meting*- in person at [facility] location and via [facility] at 2 pm.   Upcoming Power over [employer] Meetings:  2nd Wednesdays of the month at 2 pm:  September 13th, October 11th, November 8th Contact Amy Marriott at [email or web address] if interested in participating in this group Parkinson's Care Partners Group:    3rd Mondays, Contact Misty Paladino Atypical Parkinsonian Patient Group:   4th Wednesdays, [place] If you are interested in participating in these groups with Misty, please contact her directly for how to join those meetings.  Her contact information is [email or web address].    LOCAL EVENTS AND NEW OFFERINGS New PWR! Moves Dynegy Instructor-Led Classes offering at [employer]!  Wednesdays 1-2 pm.   Contact Vonna Kotyk at  [email or web address] or Caron Presume at [place], [email or web address] Dance for Parkinson 's classes will be on Tuesdays 9:30am-10:30am starting October 3-December 12 with a break the week of November 21 . Located in the [employer]  which is in the first floor of the [employer] ([place] for Parkinson's will be held on 2nd and 4th Mondays at 11:00am . First class will start  September 25th.  Located at the [place] ([place].) Through support from the [place] and Drumming for Parkinson's classes are free for both patients and caregivers.  Contact Misty Taylor-Paladino for more details about registering.  [place]:  www.[email or web address] PD Health at Home continues:  Mindfulness Mondays, Wellness  Wednesdays, Fitness Fridays  Upcoming Education:   Navigating Nutrition with PD.  Wednesday, Sept. 6th 1:00-2:00 pm Understanding Mind and Memory.  Wednesday, Sept. 20th 1:00-2:00 pm  Expert Briefing:    Parkinson's Disease and the Bladder.  Wednesday, Sept. 13th 1:00-2:00 pm Parkinson's and the Gut-Brain Connection.  Wednesday, Oct. 11th 1:00-2:00 pm Register for expert briefings (webinars) at [email or web address] Please check out their website to sign up for emails and see their full online offerings   [place]:  [email or web address]  Third Thursday Webinars:  On the third Thursday of every month at 12 p.m. ET, join our free live webinars to learn about various aspects of living with Parkinson's disease and our work to speed medical breakthroughs. Upcoming Webinar:  Stay tuned Check out additional information on their website to see their full online offerings  [employer]:  [email or web address] Upcoming Webinar:   Stay tuned Webinar Series:  Living with Parkinson's Meetup.   Third Thursdays each month, 3 pm Care Partner Monthly Meetup.  With Robin Searing [place].  First Tuesday of each month, 2 pm Check out additional information to Live Well Today on their website  Parkinson and Movement Disorders (PMD) Alliance:  www.[email or web address] [employer] Online:  Online Education Events Sign up for emails, which are sent weekly to give you updates on programming and online offerings  Parkinson's Association of the [place]:  www.[email or web address] Information on online support groups, education events, and online exercises including Yoga, Parkinson's exercises and more-LOTS of information on links to PD resources and online events Virtual Support Group through Parkinson's Association of the [place]; next one is scheduled for Wednesday, October 4th at 2 pm. (No September meeting  due to the symposium.  These are typically scheduled for the 1st Wednesday  of the month at 2 pm).  Visit website for details. Register for "Caring for Parkinson's-Caring for You", 9th Annual Symposium.  In-person event in [place].  September 9th.  To register:  [email or web address].org/symposium-registration/?blm_aid=[email or web address] MOVEMENT AND EXERCISE OPPORTUNITIES PWR! Moves Classes at [employer].  Wednesdays 10 and 11 am.   Contact Amy Marriott, PT [email or web address] if interested. NEW PWR! Moves Class offerings at [employer].  Wednesdays 1-2 pm.  Contact Vonna Kotyk at  [email or web address] or Caron Presume at [place],  [email or web address] Parkinson's Wellness Recovery (PWR! Moves)  www.[email or web address] Info on the PWR! Virtual Experience:  You will have access to our expertise through self-assessment, guided plans that start with the PD-specific fundamentals, educational content, tips, Q&A with an expert, and a growing Art therapist of PD-specific pre-recorded and live exercise classes of varying types and intensity - both physical and cognitive! If that is not enough, we offer 1:1 wellness consultations (in-person or virtual) to personalize your PWR! Research scientist (medical).  [place] Fridays:  As part of the PD Health @ Home program, this free video series focuses each week on one aspect of fitness designed to support people living with Parkinson's.  These weekly videos highlight the [place] recent fitness guidelines for people with Parkinson's disease. ModemGamers.si Dance for PD website is offering free, live-stream classes throughout the week, as well as links to [employer] of classes:  [email or web address]://danceforparkinsons.[email or web address]/ Virtual dance and Pilates for Parkinson's classes: Click on the Community Tab> Parkinson's Movement Initiative Tab.  To register for classes and for more  information, visit www.[email or web address] and click the "community" tab.  [employer] Parkinson's Cycling Classes  [employer]:  Thursdays @ Noon-Live classes at [employer] ([employer] at [email or web address] or [phone]) [employer]: Virtual Classes Mondays and Thursdays Jeanette Caprice classes Tuesday, Wednesday and Thursday (contact [place] at [email or web address]  or ([phone]) [place] Varied levels of classes are offered Tuesdays and Thursdays at [employer].  Stretching with Verdis Frederickson weekly class is also offered for people with Parkinson's To observe a class or for more information, call [phone] or email Hezzie Bump at [email or web address] ADDITIONAL SUPPORT AND RESOURCES Well[employer]Spring [employer]:Online Caregiver Education Opportunities:  www.well-springsolutions.org/caregiver-education/caregiver-support-group.  You may also contact Vickki Muff at [email or web address] or [phone].    Coping with Difficult Caregiver Emotions.  Wednesday, September 20th, 10:30 am-12.  The [facility] Navigating the Maze of Senior Care Options.  Thursday, September 28th, 4-5:15 pm.  The [facility]. Well-Spring Navigator:  [facility] program, a free service to help individuals and families through the journey of determining care for older adults.  The "Navigator" is a [employer], Education officer, museum, who will speak with a prospective client and/or loved ones to provide an assessment of the situation and a set of recommendations for a personalized care plan -- all free of charge, and whether Well-Spring [employer] offers the needed service or not. If the need is not a service we provide, we are well-connected with reputable programs in town that we can refer you to.  www.well-springsolutions.org or to speak with the Navigator, call [phone].

## 2022-03-29 ENCOUNTER — Other Ambulatory Visit: Payer: Self-pay | Admitting: Neurology

## 2022-03-29 DIAGNOSIS — G20A1 Parkinson's disease without dyskinesia, without mention of fluctuations: Secondary | ICD-10-CM

## 2022-03-29 DIAGNOSIS — G20C Parkinsonism, unspecified: Secondary | ICD-10-CM

## 2022-05-27 ENCOUNTER — Other Ambulatory Visit: Payer: Self-pay | Admitting: Neurology

## 2022-05-27 DIAGNOSIS — G20C Parkinsonism, unspecified: Secondary | ICD-10-CM

## 2022-05-27 DIAGNOSIS — G20A1 Parkinson's disease without dyskinesia, without mention of fluctuations: Secondary | ICD-10-CM

## 2022-06-18 ENCOUNTER — Other Ambulatory Visit: Payer: Self-pay | Admitting: Cardiology

## 2022-06-19 NOTE — Telephone Encounter (Signed)
Prescription refill request for Eliquis received. Indication:afib Last office visit:7/23 Scr:1.3 Age: 82 Weight:73.3  kg  Prescription refilled

## 2022-06-29 NOTE — Progress Notes (Signed)
HPI: Follow-up atrial fibrillation.  Admitted May 2021 with atrial fibrillation.  Echocardiogram showed ejection fraction 65 to 70%, mild left ventricular hypertrophy.  TSH 1.031.  Since last seen patient denies chest pain, palpitations or dyspnea.  He has not fallen.  There is no bleeding.  His tremor is worse.  Current Outpatient Medications  Medication Sig Dispense Refill   acetaminophen (TYLENOL) 500 MG tablet Take 1,000 mg by mouth every 6 (six) hours as needed for mild pain or headache.      Calcium Carbonate Antacid (TUMS PO) Take 2 tablets by mouth daily as needed (heartburn).      carbidopa-levodopa (SINEMET IR) 25-100 MG tablet TAKE 1 TABLET BY MOUTH AT 6 AM,  10 AM, 2 PM AND 6 PM 360 tablet 0   diltiazem (TIAZAC) 360 MG 24 hr capsule TAKE 1 CAPSULE BY MOUTH DAILY 90 capsule 1   ELIQUIS 5 MG TABS tablet TAKE 1 TABLET BY MOUTH TWICE  DAILY 200 tablet 2   Magnesium 400 MG CAPS Take 1 tablet by mouth daily.     Multiple Vitamins-Minerals (CENTRUM SILVER 50+MEN) TABS Take by mouth daily.     niacin 500 MG tablet Take 500 mg by mouth 2 (two) times daily.     perindopril (ACEON) 8 MG tablet Take 8 mg by mouth daily.     polyethylene glycol (MIRALAX / GLYCOLAX) packet Take 17 g by mouth daily as needed for mild constipation.      promethazine (PHENERGAN) 12.5 MG tablet Take 12.5 mg by mouth every 6 (six) hours as needed for nausea or vomiting.     tamsulosin (FLOMAX) 0.4 MG CAPS capsule Take 1 capsule (0.4 mg total) by mouth daily. 30 capsule 0   No current facility-administered medications for this visit.     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    Tremor     Past Surgical History:  Procedure Laterality Date   CLEFT LIP REPAIR     CLEFT PALATE  REPAIR     SKIN GRAFT FULL THICKNESS LEG  12/2018    Social History   Socioeconomic History   Marital status: Married    Spouse name: Not on file   Number of children: 2   Years of education: Not on file   Highest education level: Bachelor's degree (e.g., BA, AB, BS)  Occupational History   Occupation: retired    Comment: Musician - [employer]  Tobacco Use   Smoking status: Former    Types: Cigarettes    Quit date: 12/20/2012    Years since quitting: 9.5   Smokeless tobacco: Never  Vaping Use   Vaping Use: Never used  Substance and Sexual Activity   Alcohol use: No   Drug use: No   Sexual activity: Not Currently  Other Topics Concern   Not on file  Social History Narrative   Not on file   Social Determinants of Health   Financial Resource Strain: Not on file  Food Insecurity: Not on file  Transportation Needs: Not on file  Physical Activity: Not on file  Stress: Not on file  Social Connections: Not on file  Intimate Partner Violence: Not  on file    Family History  Problem Relation Age of Onset   Heart attack Mother 61   Heart disease Sister    Melanoma Sister    Heart attack Brother    Healthy Daughter    Healthy Daughter    Stomach cancer Neg Hx    Colon cancer Neg Hx     ROS: no fevers or chills, productive cough, hemoptysis, dysphasia, odynophagia, melena, hematochezia, dysuria, hematuria, rash, seizure activity, orthopnea, PND, pedal edema, claudication. Remaining systems are negative.  Physical Exam: Well-developed well-nourished in no acute distress.  Skin is warm and dry.  HEENT is normal.  Neck is supple.  Chest is clear to auscultation with normal expansion.  Cardiovascular exam is regular rate and rhythm.  Abdominal exam nontender or distended. No masses palpated. Extremities show no edema. neuro grossly intact; resting tremor from Parkinson's  A/P  1 paroxysmal atrial fibrillation-patient remains in sinus  rhythm on exam.  Continue Cardizem for rate control if atrial fibrillation recurs.  Continue apixaban.  Will consider referral for ablation or antiarrhythmic if he has more frequent episodes in the future.  Long discussion today concerning risk of anticoagulation.  He does not fall but he does have Parkinson's with a resting tremor.  I am concerned that his balance may be affected in the near future.  I therefore discussed the possibility of a Watchman device long-term.  However he declined and would prefer conservative measures at this point.  We will consider in the future if he is agreeable.  2 hypertension-blood pressure controlled.  Continue present medications.  3 history of Parkinson's-Per neurology.  Patient is not falling.   Kirk Ruths, MD

## 2022-07-12 ENCOUNTER — Ambulatory Visit: Payer: Medicare Other | Attending: Cardiology | Admitting: Cardiology

## 2022-07-12 ENCOUNTER — Encounter: Payer: Self-pay | Admitting: Cardiology

## 2022-07-12 VITALS — BP 130/62 | HR 60 | Ht 69.0 in | Wt 174.2 lb

## 2022-07-12 DIAGNOSIS — I480 Paroxysmal atrial fibrillation: Secondary | ICD-10-CM

## 2022-07-12 DIAGNOSIS — I10 Essential (primary) hypertension: Secondary | ICD-10-CM

## 2022-07-12 NOTE — Patient Instructions (Signed)
    Follow-Up: At [facility], you and your health needs are our priority.  As part of our continuing mission to provide you with exceptional heart care, we have created designated Provider Care Teams.  These Care Teams include your primary Cardiologist (physician) and Advanced Practice Providers (APPs -  Physician Assistants and Nurse Practitioners) who all work together to provide you with the care you need, when you need it.  We recommend signing up for the patient portal called "MyChart".  Sign up information is provided on this After Visit Summary.  MyChart is used to connect with patients for Virtual Visits (Telemedicine).  Patients are able to view lab/test results, encounter notes, upcoming appointments, etc.  Non-urgent messages can be sent to your provider as well.   To learn more about what you can do with MyChart, go to [email or web address].    Your next appointment:   6 month(s)  Provider:   Kirk Ruths, MD

## 2022-08-08 ENCOUNTER — Other Ambulatory Visit: Payer: Self-pay | Admitting: Neurology

## 2022-08-08 DIAGNOSIS — G20C Parkinsonism, unspecified: Secondary | ICD-10-CM

## 2022-08-08 DIAGNOSIS — G20A1 Parkinson's disease without dyskinesia, without mention of fluctuations: Secondary | ICD-10-CM

## 2022-08-13 ENCOUNTER — Telehealth: Payer: Self-pay | Admitting: *Deleted

## 2022-08-13 DIAGNOSIS — Z01818 Encounter for other preprocedural examination: Secondary | ICD-10-CM

## 2022-08-13 DIAGNOSIS — I480 Paroxysmal atrial fibrillation: Secondary | ICD-10-CM

## 2022-08-13 NOTE — Telephone Encounter (Signed)
Preoperative team, please contact patient and requesting office.  Please let them know that we do not have an updated BMP or CBC for this patient.  Please order BMP and CBC.  Once we have updated lab work we will be able to provide recommendations for upcoming's procedure.  Thank you.  Jossie Ng. Keyontay Stolz NP-C     08/13/2022, [facility] [address] Office [phone] Fax [phone]

## 2022-08-13 NOTE — Telephone Encounter (Signed)
Patient with diagnosis of A Fib on Eliquis for anticoagulation.    Procedure: RIGHT LOWER EYELID ECTROPION REPAIR  Date of procedure: 09/04/22   CHA2DS2-VASc Score = 3  This indicates a 3.2% annual risk of stroke. The patient's score is based upon: CHF History: 0 HTN History: 1 Diabetes History: 0 Stroke History: 0 Vascular Disease History: 0 Age Score: 2 Gender Score: 0   CrCl overdue Platelet count overdue  Patient needs updated BMP and CBC before clearance can be completed   **This guidance is not considered finalized until pre-operative APP has relayed final recommendations.**

## 2022-08-13 NOTE — Telephone Encounter (Signed)
Pt's wife called back and said the pt will have work done tomorrow at the [place] location. She is aware the lab orders have been placed.

## 2022-08-13 NOTE — Telephone Encounter (Signed)
Left message to call back about needing lab work for pre op clearance. I will place labs orders for BMET/CBC to be done at [facility] where the pt see's Dr. Stanford Breed. I left message to call back to let us know if he can get lab work tomorrow or Wed this week.

## 2022-08-13 NOTE — Telephone Encounter (Signed)
   Pre-operative Risk Assessment    Patient Name: Michael Odonnell  DOB: 1940-10-22 MRN: [id number]      Request for Surgical Clearance    Procedure:  RIGHT LOWER EYELID ECTROPION REPAIR  Date of Surgery:  Clearance 09/04/22                                 Surgeon:  NOT LISTED Surgeon's Group or Practice Name:  [facility] Phone number:  [phone] Fax number:  [phone]   Type of Clearance Requested:   - Pharmacy:  Hold Apixaban (Eliquis) THEY NEED DIRECTION   Type of Anesthesia:  Not Indicated   Additional requests/questions:    Arman Filter   08/13/2022, 2:40 PM

## 2022-08-14 NOTE — Telephone Encounter (Signed)
Spoke with pts wife and she states that patient came into the office this morning to have labs drawn

## 2022-08-15 LAB — CBC
Hematocrit: 38.6 % (ref 37.5–51.0)
Hemoglobin: 12.8 g/dL — ABNORMAL LOW (ref 13.0–17.7)
MCH: 29.4 pg (ref 26.6–33.0)
MCHC: 33.2 g/dL (ref 31.5–35.7)
MCV: 89 fL (ref 79–97)
Platelets: 158 10*3/uL (ref 150–450)
RBC: 4.36 x10E6/uL (ref 4.14–5.80)
RDW: 12.4 % (ref 11.6–15.4)
WBC: 8 10*3/uL (ref 3.4–10.8)

## 2022-08-15 LAB — BASIC METABOLIC PANEL
BUN/Creatinine Ratio: 23 (ref 10–24)
BUN: 24 mg/dL (ref 8–27)
CO2: 26 mmol/L (ref 20–29)
Calcium: 9.8 mg/dL (ref 8.6–10.2)
Chloride: 100 mmol/L (ref 96–106)
Creatinine, Ser: 1.06 mg/dL (ref 0.76–1.27)
Glucose: 102 mg/dL — ABNORMAL HIGH (ref 70–99)
Potassium: 4.6 mmol/L (ref 3.5–5.2)
Sodium: 139 mmol/L (ref 134–144)
eGFR: 70 mL/min/{1.73_m2} (ref >59)

## 2022-08-15 NOTE — Telephone Encounter (Signed)
I s/w the pt's wife and have gone over the instructions for pt to hold Eliquis 2 days prior to procedure with Dr. Leonard Schwartz with [facility]. Pt's wife thanked me for the help. Notes have been faxed to surgeon's office.

## 2022-08-15 NOTE — Telephone Encounter (Signed)
     Primary Cardiologist: Kirk Ruths, MD  Chart reviewed as part of pre-operative protocol coverage. Given past medical history and time since last visit, based on ACC/AHA guidelines, Ofir Griesinger would be at acceptable risk for the planned procedure without further cardiovascular testing.   Patient with diagnosis of A Fib on Eliquis for anticoagulation.     Procedure: RIGHT LOWER EYELID ECTROPION REPAIR  Date of procedure: 09/04/22     CHA2DS2-VASc Score = 3  This indicates a 3.2% annual risk of stroke. The patient's score is based upon: CHF History: 0 HTN History: 1 Diabetes History: 0 Stroke History: 0 Vascular Disease History: 0 Age Score: 2 Gender Score: 0   CrCl 59 mL/min Platelet count 158K   Patient can hold Eliquis for 2 days prior to procedure  I will route this recommendation to the requesting party via [place] fax function and remove from pre-op pool.  Please call with questions.  Jossie Ng. Hatsuko Bizzarro NP-C     08/15/2022, 8:55 AM [place] [address] Office [phone] Fax [phone]

## 2022-08-15 NOTE — Telephone Encounter (Signed)
Patient with diagnosis of A Fib on Eliquis for anticoagulation.     Procedure: RIGHT LOWER EYELID ECTROPION REPAIR  Date of procedure: 09/04/22     CHA2DS2-VASc Score = 3  This indicates a 3.2% annual risk of stroke. The patient's score is based upon: CHF History: 0 HTN History: 1 Diabetes History: 0 Stroke History: 0 Vascular Disease History: 0 Age Score: 2 Gender Score: 0   CrCl 59 mL/min Platelet count 158K   Patient can hold Eliquis for 2 days prior to procedure   **This guidance is not considered finalized until pre-operative APP has relayed final recommendations.**

## 2022-08-29 NOTE — Progress Notes (Signed)
Assessment/Plan:   1.  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  He will likely shake no matter what the Parkinsons Disease medication we use. - he follows regularly with dermatology due to hx of BCC and SCC.  He had recent Mohs.   -info given to community programs  2.  Sialorrhea  -Not bad enough for intervention.  3.  PAF  -On Cardizem and Eliquis.  Following with cardiology.  -Cardiology is considering a Watchman device given fall risk, but patient declined.  4.  Nausea, intermittent  -seems unrelated to Parkinsons Disease  -Tums/pepcid seems to help  -discussed may need to get back to GI.  Used to f/u with Dr. Henrene Pastor  -did not recommend regular phenergan as can make Parkinsons Disease sx's worse.  Zofran is ok     Subjective:   Michael Odonnell was seen today in follow up for Parkinsons disease.  My previous records were reviewed prior to todays visit as well as outside records available to me. Wife with patient and supplements hx. Pt denies falls.  Pt denies lightheadedness, near syncope.  No hallucinations.  Mood has been good.  He has seen cardiology.  He saw Dr. Stanford Breed February 1.  Cardiology considering a Watchman device given concern for fall risk on apixaban.  Patient declined at that point.  BP was high today but he says its not that way at home.    Current prescribed movement disorder medications: carbidopa/levodopa 25/100, 6am/10am/2pm/6pm    ALLERGIES:   Allergies  Allergen Reactions   Codeine Nausea And Vomiting   Doxycycline Nausea Only   Metronidazole Nausea Only    CURRENT MEDICATIONS:  Outpatient Encounter Medications as of 08/31/2022  Medication Sig   acetaminophen (TYLENOL) 500 MG tablet Take 1,000 mg by mouth every 6 (six) hours as needed for mild pain or headache.    Calcium  Carbonate Antacid (TUMS PO) Take 2 tablets by mouth daily as needed (heartburn).    carbidopa-levodopa (SINEMET IR) 25-100 MG tablet TAKE 1 TABLET BY MOUTH AT 6 AM,  10 AM, 2 PM AND 6 PM   diltiazem (TIAZAC) 360 MG 24 hr capsule TAKE 1 CAPSULE BY MOUTH DAILY   ELIQUIS 5 MG TABS tablet TAKE 1 TABLET BY MOUTH TWICE  DAILY   Magnesium 400 MG CAPS Take 1 tablet by mouth daily.   Multiple Vitamins-Minerals (CENTRUM SILVER 50+MEN) TABS Take by mouth daily.   niacin 500 MG tablet Take 500 mg by mouth 2 (two) times daily.   perindopril (ACEON) 8 MG tablet Take 8 mg by mouth daily.   polyethylene glycol (MIRALAX / GLYCOLAX) packet Take 17 g by mouth daily as needed for mild constipation.    promethazine (PHENERGAN) 12.5 MG tablet Take 12.5 mg by mouth every 6 (six) hours as needed for nausea or vomiting.   tamsulosin (FLOMAX) 0.4 MG CAPS capsule Take 1 capsule (0.4 mg total) by mouth daily.   [DISCONTINUED] diltiazem (CARDIZEM) 120 MG tablet Take 1 tablet 3 times a day by oral route. (Patient not taking: Reported on 08/31/2022)   No facility-administered encounter medications on file as of 08/31/2022.    Objective:   PHYSICAL EXAMINATION:    VITALS:   Vitals:   08/31/22 0859  BP: (!) 175/90  Pulse: 83  SpO2: 99%  Weight: 179 lb 9.6 oz (81.5 kg)  Height:  5\' 9"  (1.753 m)    GEN:  The patient appears stated age and is in NAD. HEENT:  Normocephalic, atraumatic.  The mucous membranes are moist. The superficial temporal arteries are without ropiness or tenderness. CV:  RRR Lungs:  CTAB.  He has audible upper airway stridor but lungs are clear. Neck/HEME:  There are no carotid bruits bilaterally.  Neurological examination:  Orientation: The patient is alert and oriented x3. Cranial nerves: There is good facial symmetry with facial hypomimia. Lips are parted.  The speech is fluent and clear. Soft palate rises symmetrically and there is no tongue deviation. Hearing is intact to conversational  tone. Sensation: Sensation is intact to light touch throughout Motor: Strength is at least antigravity x4.  Movement examination: Tone: There is mild increased tone in the RUE Abnormal movements: there is bilateral UE rest tremor, R>L.   Coordination:  There is no decremation with RAM's, with any form of RAMS, including alternating supination and pronation of the forearm, hand opening and closing, finger taps, heel taps and toe taps. Gait and Station: The patient has mild difficulty arising out of a deep-seated chair without the use of the hands. The patient's stride length is good.   I have reviewed and interpreted the following labs independently    Chemistry      Component Value Date/Time   NA 139 08/14/2022 1036   K 4.6 08/14/2022 1036   CL 100 08/14/2022 1036   CO2 26 08/14/2022 1036   BUN 24 08/14/2022 1036   CREATININE 1.06 08/14/2022 1036      Component Value Date/Time   CALCIUM 9.8 08/14/2022 1036   ALKPHOS 64 10/30/2019 1212   AST 21 10/30/2019 1212   ALT 18 10/30/2019 1212   BILITOT 1.3 (H) 10/30/2019 1212       Lab Results  Component Value Date   WBC 8.0 08/14/2022   HGB 12.8 (L) 08/14/2022   HCT 38.6 08/14/2022   MCV 89 08/14/2022   PLT 158 08/14/2022    Lab Results  Component Value Date   TSH 1.031 10/19/2019     Cc:  Reynold Bowen, MD

## 2022-08-31 ENCOUNTER — Ambulatory Visit: Payer: Medicare Other | Admitting: Neurology

## 2022-08-31 VITALS — BP 175/90 | HR 83 | Ht 69.0 in | Wt 179.6 lb

## 2022-08-31 DIAGNOSIS — G20A1 Parkinson's disease without dyskinesia, without mention of fluctuations: Secondary | ICD-10-CM | POA: Diagnosis not present

## 2022-08-31 DIAGNOSIS — K117 Disturbances of salivary secretion: Secondary | ICD-10-CM | POA: Diagnosis not present

## 2022-08-31 NOTE — Patient Instructions (Signed)
You look great today!  Local and Online Resources for Power over Parkinson's Group  March 2024   LOCAL [place] PARKINSON'S GROUPS   Power over Parkinson's Group:    Power Over Parkinson's Patient Education Group will be Wednesday, March 13th-*Hybrid meting*- in person at [facility] location and via WEBEX, 2:00-3:00 pm.   Starting in November 2023, Power over Parkinson's and Care Partner Groups will meet together, with plans for separate break out session for caregivers (*this will be evolving over the next few months) Upcoming Power over Parkinson's Meetings/Care Partner Support:  2nd Wednesdays of the month at 2 pm:  March 13th, April 10th Contact Amy Marriott at [email or web address] if interested in participating in this group    LOCAL EVENTS AND NEW OFFERINGS  Let's Try Pickleball-$25 for 6 weeks of Pickleball, starting February 2nd.  Contact Sarah Chambers for more details.  [email or web address] NEW:  Parkinson's Social Game Night.  First Thursday of each month, 2:00-4:00 pm.  *Next date is MARCH 7th*.  [facility], [place].  Contact [email or web address] if interested. Parkinson's CarePartner Group for Men is in the works, if interested email Sarah  [email or web address] ACT [employer] Chair Yoga classes "Train and Gain", Fridays 10 am, [employer].  Contact Gina at [phone].  PWR! Moves Community Fitness Instructor-Led Classes offering at [employer]!  TUESDAYS and Wednesdays 1-2 pm.   Contact Christy Weaver at  [email or web address]  or [phone] (Tuesday classes are modified for chair and standing only) Drumming for Parkinson's will be held on 2nd and 4th Mondays at 11:00 am.   Located at the [employer] ([address] [place].)  Contact Jane Maydian at [email or web address] or [phone]  Dance for Parkinson 's classes will be on Tuesdays 10-11 am starting in  February. Located in the [facility], in the first floor of the [facility] ([address] To register:  [email or web address] or [phone] [employer] Parkinson's Tai Chi Class, Mondays at 11 am.  Call [phone] for details Moving Day [place].  Saturday, May 4th, 10 am start.  Register at [email or web address]    ONLINE EDUCATION AND SUPPORT  Parkinson Foundation:  www.parkinson.org  PD Health at Home continues:  Mindfulness Mondays, Wellness Wednesdays, Fitness Fridays  (PWR! Moves as part of Fitness Fridays March 22nd, 1-1:45 pm) Upcoming Education:   Managing "Off" Periods:  Return of Parkinson's Symptoms.  Wednesday, March 20th, 1-2 pm Parkinson's 101.  Wednesday, April 3rd, 1-2 pm Expert Briefing:  Understanding Pain in Parkinson's.   Wednesday, March 13th, 1-2 pm  Research Update:  Working to Halt PD.  Wednesday, April 10th, 1-2 pm Register for virtual education and expert briefings (webinars) at www.[email or web address].org/resources-support/online-education Please check out their website to sign up for emails and see their full online offerings     Michael J Fox Foundation:  www.michaeljfox.org   Third Thursday Webinars:  On the third Thursday of every month at 12 p.m. ET, join our free live webinars to learn about various aspects of living with Parkinson's disease and our work to speed medical breakthroughs.  Upcoming Webinar:  Everyday Exposure to Parkinson's:  Environmental Connections to the Disease.  Thursday, March 21st at 12 noon. Check out additional information on their website to see their full online offerings    Davis Phinney Foundation:  www.davisphinneyfoundation.org  Upcoming Webinar:   Nutrition and Parkinson's.  Wednesday, March 6th, 12 noon Webinar Series:  Living with Parkinson's Meetup.   Third Thursdays each month, 3   pm  Care Partner Monthly Meetup.  With Connie Carpenter Phinney.  First Tuesday of each month, 2 pm  Check  out additional information to Live Well Today on their website    Parkinson and Movement Disorders (PMD) Alliance:  www.pmdalliance.org  [employer]:  Online Education Events  Sign up for emails, which are sent weekly to give you updates on programming and online offerings    Parkinson's Association of [place]:  www.parkinsonassociation.org  Information on online support groups, education events, and online exercises including Yoga, Parkinson's exercises and more-LOTS of information on links to PD resources and online events  Virtual Support Group through Parkinson's Association of [place]; next one is scheduled for Wednesday, March 6th  MOVEMENT AND EXERCISE OPPORTUNITIES  PWR! Moves Classes at [facility].  Wednesdays 10 and 11 am.   Contact Amy Marriott, PT [email or web address] if interested.  PWR! Moves Class offerings at [employer]. *TUESDAYS* and Wednesdays 1-2 pm.    Contact Christy Weaver at  [email or web address]    Parkinson's Wellness Recovery (PWR! Moves)  www.pwr4life.org  Info on the PWR! Virtual Experience:  You will have access to our expertise?through self-assessment, guided plans that start with the PD-specific fundamentals, educational content, tips, Q&A with an expert, and a growing library of PD-specific pre-recorded and live exercise classes of varying types and intensity - both physical and cognitive! If that is not enough, we offer 1:1 wellness consultations (in-person or virtual) to personalize your PWR! Virtual Experience.   Parkinson Foundation Fitness Fridays:   As part of the PD Health @ Home program, this free video series focuses each week on one aspect of fitness designed to support people living with Parkinson's.? These weekly videos highlight the Parkinson Foundation fitness guidelines for people with Parkinson's disease.  www.parkinson.org/resources-support/online-education/pdhealth#ff  Dance for PD website is  offering free, live-stream classes throughout the week, as well as links to digital library of classes:  [email or web address]://danceforparkinsons.org/  Virtual dance and Pilates for Parkinson's classes: Click on the Community Tab> Parkinson's Movement Initiative Tab.  To register for classes and for more information, visit www.americandancefestival.org and click the "community" tab.   [employer] Parkinson's Cycling Classes   [employer]:  Thursdays @ Noon-Live classes at [employer] (Contact Margaret Hazen at [email or web address]?or [phone])  [facility] [employer]: Virtual Classes Mondays and Thursdays /Live classes Tuesday, Wednesday and Thursday (contact Marlee at [email or web address] ?or [phone])  [place] [employer]ady Boxing  Varied levels of classes are offered Tuesdays and Thursdays at [facility].   Stretching with Maria weekly class is also offered for people with Parkinson's  To observe a class or for more information, call [phone] or email Hillary Savage at [email or web address]   ADDITIONAL SUPPORT AND RESOURCES  [employer]:Online Caregiver Education Opportunities:  www.well-springsolutions.org/caregiver-education/caregiver-support-group.  You may also contact Jodi Kolada at [email or web address] or [phone].     Family Caregiver [facility].  Thursday, March 7th, 10:15-1:45 at [facility].  Register with Jodi Kolada (see above) Well-Spring Navigator:  Just1Navigator program, a?free service to help individuals and families through the journey of determining care for older adults.  The "Navigator" is a social worker, Nicole Reynolds, who will speak with a prospective client and/or loved ones to provide an assessment of the situation and a set of recommendations for a personalized care plan -- all free of charge, and whether?[employer] offers the needed service or not. If the need is not a service we provide, we are well-connected  with reputable programs in town that we can refer   you to.  www.well-springsolutions.org or to speak with the Navigator, call [phone].     

## 2022-10-17 ENCOUNTER — Other Ambulatory Visit: Payer: Self-pay | Admitting: Neurology

## 2022-10-17 DIAGNOSIS — G20A1 Parkinson's disease without dyskinesia, without mention of fluctuations: Secondary | ICD-10-CM

## 2022-10-17 DIAGNOSIS — G20C Parkinsonism, unspecified: Secondary | ICD-10-CM

## 2022-10-23 ENCOUNTER — Other Ambulatory Visit: Payer: Self-pay | Admitting: Cardiology

## 2022-12-05 NOTE — Progress Notes (Signed)
HPI: Follow-up atrial fibrillation.  Admitted May 2021 with atrial fibrillation.  Echocardiogram showed ejection fraction 65 to 70%, mild left ventricular hypertrophy.  TSH 1.031.  Since last seen he denies dyspnea, chest pain, palpitations or syncope.  He does have difficulty with balance.  He has not fallen.  No bleeding.  Current Outpatient Medications  Medication Sig Dispense Refill   acetaminophen (TYLENOL) 500 MG tablet Take 1,000 mg by mouth every 6 (six) hours as needed for mild pain or headache.      Calcium Carbonate Antacid (TUMS PO) Take 2 tablets by mouth daily as needed (heartburn).      carbidopa-levodopa (SINEMET IR) 25-100 MG tablet TAKE 1 TABLET BY MOUTH AT 6 AM,  10 AM, 2 PM AND 6 PM 360 tablet 0   diltiazem (TIAZAC) 360 MG 24 hr capsule TAKE 1 CAPSULE BY MOUTH DAILY 100 capsule 3   ELIQUIS 5 MG TABS tablet TAKE 1 TABLET BY MOUTH TWICE  DAILY 200 tablet 2   Magnesium 400 MG CAPS Take 1 tablet by mouth daily.     Multiple Vitamins-Minerals (CENTRUM SILVER 50+MEN) TABS Take by mouth daily.     niacin 500 MG tablet Take 500 mg by mouth 2 (two) times daily.     perindopril (ACEON) 8 MG tablet Take 8 mg by mouth daily.     polyethylene glycol (MIRALAX / GLYCOLAX) packet Take 17 g by mouth daily as needed for mild constipation.      tamsulosin (FLOMAX) 0.4 MG CAPS capsule Take 1 capsule (0.4 mg total) by mouth daily. 30 capsule 0   promethazine (PHENERGAN) 12.5 MG tablet Take 12.5 mg by mouth every 6 (six) hours as needed for nausea or vomiting. (Patient not taking: Reported on 12/17/2022)     No current facility-administered medications for this visit.     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    Tremor     Past Surgical History:  Procedure  Laterality Date   CLEFT LIP REPAIR     CLEFT PALATE REPAIR     SKIN GRAFT FULL THICKNESS LEG  12/2018    Social History   Socioeconomic History   Marital status: Married    Spouse name: Not on file   Number of children: 2   Years of education: Not on file   Highest education level: Bachelor's degree (e.g., BA, AB, BS)  Occupational History   Occupation: retired    Comment: Aeronautical engineer - [employer]  Tobacco Use   Smoking status: Former    Types: Cigarettes    Quit date: 12/20/2012    Years since quitting: 9.9   Smokeless tobacco: Never  Vaping Use   Vaping Use: Never used  Substance and Sexual Activity   Alcohol use: No   Drug use: No   Sexual activity: Not Currently  Other Topics Concern   Not on file  Social History Narrative   Not on file   Social Determinants of Health   Financial Resource Strain: Not on file  Food Insecurity: Not on file  Transportation Needs: Not on file  Physical Activity: Not on file  Stress: Not on file  Social Connections: Not  on file  Intimate Partner Violence: Not on file    Family History  Problem Relation Age of Onset   Heart attack Mother 41   Heart disease Sister    Melanoma Sister    Heart attack Brother    Healthy Daughter    Healthy Daughter    Stomach cancer Neg Hx    Colon cancer Neg Hx     ROS: no fevers or chills, productive cough, hemoptysis, dysphasia, odynophagia, melena, hematochezia, dysuria, hematuria, rash, seizure activity, orthopnea, PND, pedal edema, claudication. Remaining systems are negative.  Physical Exam: Well-developed well-nourished in no acute distress.  Skin is warm and dry.  HEENT is normal.  Neck is supple.  Chest is clear to auscultation with normal expansion.  Cardiovascular exam is regular rate and rhythm.  Abdominal exam nontender or distended. No masses palpated. Extremities show no edema. neuro consistent with Parkinson's.  Resting tremor.  EKG  Interpretation Date/Time:  Monday December 17 2022 10:46:52 EDT Ventricular Rate:  117 PR Interval:    QRS Duration:  82 QT Interval:  332 QTC Calculation: 463 R Axis:   204  Text Interpretation: Significant artifact due to tremors; probable sinus When compared with ECG of 30-Oct-2019 10:18, PREVIOUS ECG IS PRESENT Confirmed by Olga Millers ([id number]) on 12/17/2022 11:08:59 AM    A/P  1 paroxysmal atrial fibrillation-patient remains in sinus rhythm.  Will continue Cardizem and apixaban at present dose.  If he has more frequent episodes in the future we will consider referral for ablation versus initiation of antiarrhythmic therapy.  Given his history of Parkinson's with resting tremor I again discussed Watchman device with the patient to avoid long-term anticoagulation (potential risk of fall).  However he would prefer to be conservative but will contact us in the future if he is agreeable.  2 hypertension-blood pressure controlled.  Continue present medications and follow-up.  3 Parkinson's-managed by neurology.  Olga Millers, MD

## 2022-12-17 ENCOUNTER — Ambulatory Visit: Payer: Medicare Other | Admitting: Cardiology

## 2022-12-17 ENCOUNTER — Encounter: Payer: Self-pay | Admitting: Cardiology

## 2022-12-17 VITALS — BP 142/68 | Ht 69.0 in | Wt 173.0 lb

## 2022-12-17 DIAGNOSIS — I480 Paroxysmal atrial fibrillation: Secondary | ICD-10-CM

## 2022-12-17 DIAGNOSIS — I10 Essential (primary) hypertension: Secondary | ICD-10-CM | POA: Diagnosis not present

## 2022-12-17 DIAGNOSIS — I4891 Unspecified atrial fibrillation: Secondary | ICD-10-CM | POA: Diagnosis not present

## 2022-12-17 NOTE — Patient Instructions (Signed)
    Follow-Up: At [facility], you and your health needs are our priority.  As part of our continuing mission to provide you with exceptional heart care, we have created designated Provider Care Teams.  These Care Teams include your primary Cardiologist (physician) and Advanced Practice Providers (APPs -  Physician Assistants and Nurse Practitioners) who all work together to provide you with the care you need, when you need it.  We recommend signing up for the patient portal called "MyChart".  Sign up information is provided on this After Visit Summary.  MyChart is used to connect with patients for Virtual Visits (Telemedicine).  Patients are able to view lab/test results, encounter notes, upcoming appointments, etc.  Non-urgent messages can be sent to your provider as well.   To learn more about what you can do with MyChart, go to [email or web address].    Your next appointment:   6 month(s)  Provider:   Brian Crenshaw, MD      

## 2023-03-05 NOTE — Progress Notes (Unsigned)
Assessment/Plan:   1.  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  He will likely shake no matter what the Parkinsons Disease medication we use. - he follows regularly with dermatology due to hx of BCC and SCC.  He had recent Mohs.   -info given to community programs  2.  Sialorrhea  -Not bad enough for intervention.  3.  PAF  -On Cardizem and Eliquis.  Following with cardiology.  -Cardiology has been discussing Watchman device with patient because of fall risk with Parkinsons, but ultimately patient has declined thus far.  4.  Nausea, intermittent  -seems unrelated to Parkinsons Disease  -Tums/pepcid seems to help  -Patient is to follow-up with Dr. Marina Goodell, and I recommend that he do that again if nausea persists.  -did not recommend regular phenergan as can make Parkinsons Disease sx's worse.  Zofran is ok     Subjective:   Michael Odonnell was seen today in follow up for Parkinsons disease.  My previous records were reviewed prior to todays visit as well as outside records available to me. Wife with patient and supplements hx. he has been doing fairly well from a Parkinson's standpoint.  Continues to take his levodopa.  Last visit, we talked about his nausea and it really did not seem to be related to Parkinson's/our medicines.  We told him he probably should get back to GI.  He has not done that yet.  He did see the cardiologist since our last visit.  They again discussed Watchman device to avoid the long-term risks of anticoagulation.  Current prescribed movement disorder medications: carbidopa/levodopa 25/100, 6am/10am/2pm/6pm    ALLERGIES:   Allergies  Allergen Reactions   Codeine Nausea And Vomiting   Doxycycline Nausea Only   Metronidazole Nausea Only    CURRENT MEDICATIONS:  Outpatient Encounter  Medications as of 03/06/2023  Medication Sig   acetaminophen (TYLENOL) 500 MG tablet Take 1,000 mg by mouth every 6 (six) hours as needed for mild pain or headache.    Calcium Carbonate Antacid (TUMS PO) Take 2 tablets by mouth daily as needed (heartburn).    carbidopa-levodopa (SINEMET IR) 25-100 MG tablet TAKE 1 TABLET BY MOUTH AT 6 AM,  10 AM, 2 PM AND 6 PM   diltiazem (TIAZAC) 360 MG 24 hr capsule TAKE 1 CAPSULE BY MOUTH DAILY   ELIQUIS 5 MG TABS tablet TAKE 1 TABLET BY MOUTH TWICE  DAILY   Magnesium 400 MG CAPS Take 1 tablet by mouth daily.   Multiple Vitamins-Minerals (CENTRUM SILVER 50+MEN) TABS Take by mouth daily.   niacin 500 MG tablet Take 500 mg by mouth 2 (two) times daily.   perindopril (ACEON) 8 MG tablet Take 8 mg by mouth daily.   polyethylene glycol (MIRALAX / GLYCOLAX) packet Take 17 g by mouth daily as needed for mild constipation.    promethazine (PHENERGAN) 12.5 MG tablet Take 12.5 mg by mouth every 6 (six) hours as needed for nausea or vomiting. (Patient not taking: Reported on 12/17/2022)   tamsulosin (FLOMAX) 0.4 MG CAPS capsule Take 1 capsule (0.4 mg total) by mouth daily.   No facility-administered encounter medications on file as of 03/06/2023.    Objective:   PHYSICAL EXAMINATION:    VITALS:   There were no vitals filed for this visit.   GEN:  The patient appears stated age and  is in NAD. HEENT:  Normocephalic, atraumatic.  The mucous membranes are moist. The superficial temporal arteries are without ropiness or tenderness. CV:  RRR Lungs:  CTAB.  He has audible upper airway stridor but lungs are clear. Neck/HEME:  There are no carotid bruits bilaterally.  Neurological examination:  Orientation: The patient is alert and oriented x3. Cranial nerves: There is good facial symmetry with facial hypomimia. Lips are parted.  The speech is fluent and clear. Soft palate rises symmetrically and there is no tongue deviation. Hearing is intact to conversational  tone. Sensation: Sensation is intact to light touch throughout Motor: Strength is at least antigravity x4.  Movement examination: Tone: There is mild increased tone in the RUE Abnormal movements: there is bilateral UE rest tremor, R>L.   Coordination:  There is no decremation with RAM's, with any form of RAMS, including alternating supination and pronation of the forearm, hand opening and closing, finger taps, heel taps and toe taps. Gait and Station: The patient has mild difficulty arising out of a deep-seated chair without the use of the hands. The patient's stride length is good.   I have reviewed and interpreted the following labs independently    Chemistry      Component Value Date/Time   NA 139 08/14/2022 1036   K 4.6 08/14/2022 1036   CL 100 08/14/2022 1036   CO2 26 08/14/2022 1036   BUN 24 08/14/2022 1036   CREATININE 1.06 08/14/2022 1036      Component Value Date/Time   CALCIUM 9.8 08/14/2022 1036   ALKPHOS 64 10/30/2019 1212   AST 21 10/30/2019 1212   ALT 18 10/30/2019 1212   BILITOT 1.3 (H) 10/30/2019 1212       Lab Results  Component Value Date   WBC 8.0 08/14/2022   HGB 12.8 (L) 08/14/2022   HCT 38.6 08/14/2022   MCV 89 08/14/2022   PLT 158 08/14/2022    Lab Results  Component Value Date   TSH 1.031 10/19/2019   Total time spent on today's visit was *** minutes, including both face-to-face time and nonface-to-face time.  Time included that spent on review of records (prior notes available to me/labs/imaging if pertinent), discussing treatment and goals, answering patient's questions and coordinating care.   Cc:  Adrian Prince, MD

## 2023-03-06 ENCOUNTER — Ambulatory Visit: Payer: Medicare Other | Admitting: Neurology

## 2023-03-06 ENCOUNTER — Encounter: Payer: Self-pay | Admitting: Neurology

## 2023-03-06 VITALS — BP 138/80 | HR 85 | Ht 69.0 in | Wt 170.2 lb

## 2023-03-06 DIAGNOSIS — G20A1 Parkinson's disease without dyskinesia, without mention of fluctuations: Secondary | ICD-10-CM

## 2023-04-12 ENCOUNTER — Other Ambulatory Visit: Payer: Self-pay

## 2023-04-12 ENCOUNTER — Emergency Department (HOSPITAL_BASED_OUTPATIENT_CLINIC_OR_DEPARTMENT_OTHER)
Admission: EM | Admit: 2023-04-12 | Discharge: 2023-04-12 | Disposition: A | Discharge status: Home / Self Care | Payer: Medicare Other | Attending: Emergency Medicine | Admitting: Emergency Medicine

## 2023-04-12 ENCOUNTER — Other Ambulatory Visit (HOSPITAL_BASED_OUTPATIENT_CLINIC_OR_DEPARTMENT_OTHER): Payer: Self-pay

## 2023-04-12 ENCOUNTER — Emergency Department (HOSPITAL_BASED_OUTPATIENT_CLINIC_OR_DEPARTMENT_OTHER): Payer: Medicare Other

## 2023-04-12 DIAGNOSIS — Z7901 Long term (current) use of anticoagulants: Secondary | ICD-10-CM | POA: Diagnosis not present

## 2023-04-12 DIAGNOSIS — N3289 Other specified disorders of bladder: Secondary | ICD-10-CM | POA: Insufficient documentation

## 2023-04-12 DIAGNOSIS — R300 Dysuria: Secondary | ICD-10-CM | POA: Diagnosis present

## 2023-04-12 LAB — CBC WITH DIFFERENTIAL/PLATELET
Abs Immature Granulocytes: 0.06 10*3/uL (ref 0.00–0.07)
Basophils Absolute: 0.1 10*3/uL (ref 0.0–0.1)
Basophils Relative: 0 %
Eosinophils Absolute: 0.1 10*3/uL (ref 0.0–0.5)
Eosinophils Relative: 1 %
HCT: 31 % — ABNORMAL LOW (ref 39.0–52.0)
Hemoglobin: 10.3 g/dL — ABNORMAL LOW (ref 13.0–17.0)
Immature Granulocytes: 0 %
Lymphocytes Relative: 7 %
Lymphs Abs: 1 10*3/uL (ref 0.7–4.0)
MCH: 30.5 pg (ref 26.0–34.0)
MCHC: 33.2 g/dL (ref 30.0–36.0)
MCV: 91.7 fL (ref 80.0–100.0)
Monocytes Absolute: 1.2 10*3/uL — ABNORMAL HIGH (ref 0.1–1.0)
Monocytes Relative: 9 %
Neutro Abs: 11.3 10*3/uL — ABNORMAL HIGH (ref 1.7–7.7)
Neutrophils Relative %: 83 %
Platelets: 265 10*3/uL (ref 150–400)
RBC: 3.38 MIL/uL — ABNORMAL LOW (ref 4.22–5.81)
RDW: 13 % (ref 11.5–15.5)
WBC: 13.7 10*3/uL — ABNORMAL HIGH (ref 4.0–10.5)
nRBC: 0 % (ref 0.0–0.2)

## 2023-04-12 LAB — BASIC METABOLIC PANEL
Anion gap: 9 (ref 5–15)
BUN: 18 mg/dL (ref 8–23)
CO2: 26 mmol/L (ref 22–32)
Calcium: 9.4 mg/dL (ref 8.9–10.3)
Chloride: 103 mmol/L (ref 98–111)
Creatinine, Ser: 0.92 mg/dL (ref 0.61–1.24)
GFR, Estimated: >60 mL/min (ref >60)
Glucose, Bld: 115 mg/dL — ABNORMAL HIGH (ref 70–99)
Potassium: 3.8 mmol/L (ref 3.5–5.1)
Sodium: 138 mmol/L (ref 135–145)

## 2023-04-12 MED ORDER — OXYBUTYNIN CHLORIDE 5 MG PO TABS: 2.5000 mg | ORAL_TABLET | Freq: Three times a day (TID) | ORAL | Status: DC

## 2023-04-12 MED ORDER — MIRABEGRON ER 25 MG PO TB24
25.0000 mg | ORAL_TABLET | Freq: Every day | ORAL | 0 refills | Status: DC
  Filled 2023-04-12: qty 30, 30d supply, fill #0

## 2023-04-12 MED ORDER — POLYETHYLENE GLYCOL 3350 17 G PO PACK
17.0000 g | PACK | Freq: Every day | ORAL | 0 refills | Status: DC
  Filled 2023-04-12: qty 14, 14d supply, fill #0

## 2023-04-12 MED ORDER — LIDOCAINE HCL URETHRAL/MUCOSAL 2 % EX GEL
1.0000 | Freq: Once | CUTANEOUS | Status: AC
  Administered 2023-04-12: 1 via URETHRAL
  Filled 2023-04-12: qty 11

## 2023-04-12 NOTE — Discharge Instructions (Addendum)
You can take Mirabegron each day for the next 30 days.  This medication may help with bladder spasms which I believe you are having.  Please call your primary care doctor to discuss this may send.  Your CAT scan multiple findings that your primary care doctor will need to arrange for further testing on.  Included below is the impression from the radiologist. IMPRESSION:  1. Circumferential pericystic stranding of the underdistended  urinary bladder, which may be related to cystitis or chronic outlet  obstruction in the setting of markedly enlarged prostate gland.  2. Punctate nonobstructing left renal stones. No hydronephrosis.  3. Indeterminate 1.1 cm exophytic medial right interpolar renal  lesion does not measure simple fluid attenuation. Recommend further  evaluation with nonemergent contrast-enhanced MR abdomen or renal  protocol CT.  4. Small bilateral inguinal hernias containing nonobstructed small  bowel on the right and fat on the left.  5. Multifocal nonenlarged pelvic lymph nodes, nonspecific. Recommend  correlation with PSA.  6. Aortic Atherosclerosis (ICD10-I70.0). Coronary artery  calcifications. Assessment for potential risk factor modification,  dietary therapy or pharmacologic therapy may be warranted, if  clinically indicated.

## 2023-04-12 NOTE — ED Notes (Signed)
Patient requesting to talk to DO before blood draw. DO made aware.

## 2023-04-12 NOTE — ED Triage Notes (Signed)
Pt reports dysuria for past few weeks, along with urgency, frequency and retention for past few days.  Pt seen by PMD on Tuesday, UA came back unremarkable per pt's wife Joann at bedside. Pt c/o pressure and feeling like he needs to urinate, but ability to only " Squeeze out a few tablespoons."  Pt's tamulsolin Rx dose decreased on Tuesday.

## 2023-04-12 NOTE — ED Provider Notes (Signed)
Signout from Dr. Andria Meuse.  82 year old male here with urinary frequency hesitancy.  Had a seen PCP already and had a negative UA.  Sent here for further evaluation.  He is pending lab work and CT abdomen and pelvis.  If no acute findings can be discharged to follow-up with PCP. Physical Exam  BP (!) 134/57   Pulse 63   Resp 14   SpO2 96%   Physical Exam  Procedures  Procedures  ED Course / MDM    Medical Decision Making Amount and/or Complexity of Data Reviewed Labs: ordered. Radiology: ordered.  Risk OTC drugs. Prescription drug management.   Patient CT showing multiple findings.  They mostly look like things that will be followed up as an outpatient.  I reviewed this with patient and his wife.  Prescription has already been sent to the pharmacy to try for bladder spasms.  They understand her need to follow-up with the PCP and likely will need to see urology.  Included below is the CT findings IMPRESSION:  1. Circumferential pericystic stranding of the underdistended  urinary bladder, which may be related to cystitis or chronic outlet  obstruction in the setting of markedly enlarged prostate gland.  2. Punctate nonobstructing left renal stones. No hydronephrosis.  3. Indeterminate 1.1 cm exophytic medial right interpolar renal  lesion does not measure simple fluid attenuation. Recommend further  evaluation with nonemergent contrast-enhanced MR abdomen or renal  protocol CT.  4. Small bilateral inguinal hernias containing nonobstructed small  bowel on the right and fat on the left.  5. Multifocal nonenlarged pelvic lymph nodes, nonspecific. Recommend  correlation with PSA.  6. Aortic Atherosclerosis (ICD10-I70.0). Coronary artery  calcifications. Assessment for potential risk factor modification,  dietary therapy or pharmacologic therapy may be warranted, if  clinically indicated.         Terrilee Files, MD 04/13/23 0930

## 2023-04-12 NOTE — ED Notes (Signed)
Patient dressed and ready to go home. RN explained delay and has alerted provider to patients wishes.

## 2023-04-12 NOTE — ED Notes (Signed)
After foley insertion, pt c/o increasing pain with feeling the continuous need to void. MD was made aware of pt's condition and he advised via secure chat to remove the foley d/t possible bladder spasms. Foley was removed and pt states it feels "much better"

## 2023-04-12 NOTE — ED Notes (Signed)
MD at bedside to update patient and family

## 2023-04-18 NOTE — ED Provider Notes (Signed)
[place] EMERGENCY DEPARTMENT AT [facility] Provider Note   CSN: [id number] Arrival date & time: 04/12/23  1159     History  Chief Complaint  Patient presents with   Dysuria    Michael Odonnell is a 82 y.o. male.  82 year old male here today with sensation of feeling though he needs to urinate.  No fever, no chills.   Dysuria Presenting symptoms: dysuria        Home Medications Prior to Admission medications   Medication Sig Start Date End Date Taking? Authorizing Provider  mirabegron ER (MYRBETRIQ) 25 MG TB24 tablet Take 1 tablet (25 mg total) by mouth daily. 04/12/23  Yes Anders Simmonds T, DO  polyethylene glycol (MIRALAX) 17 g packet Take 17 g by mouth daily. 04/12/23  Yes Anders Simmonds T, DO  acetaminophen (TYLENOL) 500 MG tablet Take 1,000 mg by mouth every 6 (six) hours as needed for mild pain or headache.     [provider]  Calcium Carbonate Antacid (TUMS PO) Take 2 tablets by mouth daily as needed (heartburn).     [provider]  carbidopa-levodopa (SINEMET IR) 25-100 MG tablet TAKE 1 TABLET BY MOUTH AT 6 AM,  10 AM, 2 PM AND 6 PM 10/17/22   Tat, Octaviano Batty, DO  diltiazem (TIAZAC) 360 MG 24 hr capsule TAKE 1 CAPSULE BY MOUTH DAILY 10/23/22   Lewayne Bunting, MD  ELIQUIS 5 MG TABS tablet TAKE 1 TABLET BY MOUTH TWICE  DAILY 06/19/22   Lewayne Bunting, MD  Magnesium 400 MG CAPS Take 1 tablet by mouth daily.    [provider]  Multiple Vitamins-Minerals (CENTRUM SILVER 50+MEN) TABS Take by mouth daily.    [provider]  niacin 500 MG tablet Take 500 mg by mouth 2 (two) times daily.    [provider]  perindopril (ACEON) 8 MG tablet Take 8 mg by mouth daily. 12/30/17   [provider]  promethazine (PHENERGAN) 12.5 MG tablet Take 12.5 mg by mouth every 6 (six) hours as needed for nausea or vomiting.    [provider]  tamsulosin (FLOMAX) 0.4 MG CAPS capsule Take 1 capsule (0.4 mg total) by mouth  daily. 12/22/16   Mackuen, Courteney Lyn, MD      Allergies    Codeine, Doxycycline, and Metronidazole    Review of Systems   Review of Systems  Genitourinary:  Positive for dysuria.    Physical Exam Updated Vital Signs BP (!) 149/57 (BP Location: Right Arm)   Pulse 65   Temp 98.2 F (36.8 C) (Oral)   Resp 16   SpO2 95%  Physical Exam Vitals reviewed.  Abdominal:     General: Abdomen is flat.     Palpations: Abdomen is soft.  Genitourinary:    Penis: Normal.   Neurological:     Mental Status: He is alert.     ED Results / Procedures / Treatments   Labs (all labs ordered are listed, but only abnormal results are displayed) Labs Reviewed  CBC WITH DIFFERENTIAL/PLATELET - Abnormal; Notable for the following components:      Result Value   WBC 13.7 (*)    RBC 3.38 (*)    Hemoglobin 10.3 (*)    HCT 31.0 (*)    Neutro Abs 11.3 (*)    Monocytes Absolute 1.2 (*)    All other components within normal limits  BASIC METABOLIC PANEL - Abnormal; Notable for the following components:   Glucose, Bld 115 (*)  All other components within normal limits    EKG None  Radiology No results found.  Procedures Procedures    Medications Ordered in ED Medications  lidocaine (XYLOCAINE) 2 % jelly 1 Application (1 Application Urethral Given 04/12/23 1308)    ED Course/ Medical Decision Making/ A&P                                 Medical Decision Making 82 year old male here today with sensation of feeling as though he needs to urinate.  Differential diagnoses include bladder retention, urinary retention, cystitis, bladder spasm.  Plan # with the patient's history of Parkinson's disease, could certainly be bladder spasm.  Had nursing staff do In-N-Out catheter, and unfortunately patient was still feeling as though he needed to urinate despite there being no urine.  Did order a CT scan of the patient's abdomen to see if there is other etiology ongoing.  Plan to discharge  patient with antispasmodics.  Patient was signed out to Dr. Pilar Plate.  Amount and/or Complexity of Data Reviewed Labs: ordered. Radiology: ordered.  Risk OTC drugs. Prescription drug management.           Final Clinical Impression(s) / ED Diagnoses Final diagnoses:  Bladder spasm    Rx / DC Orders ED Discharge Orders          Ordered    mirabegron ER (MYRBETRIQ) 25 MG TB24 tablet  Daily        04/12/23 1457    polyethylene glycol (MIRALAX) 17 g packet  Daily        04/12/23 1458              Anders Simmonds T, DO 04/18/23 1627

## 2023-04-21 ENCOUNTER — Other Ambulatory Visit: Payer: Self-pay | Admitting: Neurology

## 2023-04-21 DIAGNOSIS — G20A1 Parkinson's disease without dyskinesia, without mention of fluctuations: Secondary | ICD-10-CM

## 2023-04-21 DIAGNOSIS — G20C Parkinsonism, unspecified: Secondary | ICD-10-CM

## 2023-04-28 ENCOUNTER — Other Ambulatory Visit: Payer: Self-pay | Admitting: Cardiology

## 2023-04-29 NOTE — Telephone Encounter (Signed)
Prescription refill request for Eliquis received. Indication:afib Last office visit:7/24 Scr:0.92  11/24 Age: 82 Weight:77.2  kg  Prescription refilled

## 2023-07-30 ENCOUNTER — Other Ambulatory Visit: Payer: Self-pay | Admitting: Neurology

## 2023-07-30 DIAGNOSIS — G20C Parkinsonism, unspecified: Secondary | ICD-10-CM

## 2023-07-30 DIAGNOSIS — G20A1 Parkinson's disease without dyskinesia, without mention of fluctuations: Secondary | ICD-10-CM

## 2023-08-12 NOTE — Progress Notes (Unsigned)
 HPI: Follow-up atrial fibrillation.  Admitted May 2021 with atrial fibrillation.  Echocardiogram showed ejection fraction 65 to 70%, mild left ventricular hypertrophy.  TSH 1.031.  Abdominal CT November 2024 showed 1.1 cm right renal lesion and follow-up MRI or renal protocol CT recommended; pelvic lymph nodes recommended and PSA recommended; coronary artery calcification noted.  Since last seen   Current Outpatient Medications  Medication Sig Dispense Refill   acetaminophen (TYLENOL) 500 MG tablet Take 1,000 mg by mouth every 6 (six) hours as needed for mild pain or headache.      Calcium Carbonate Antacid (TUMS PO) Take 2 tablets by mouth daily as needed (heartburn).      carbidopa-levodopa (SINEMET IR) 25-100 MG tablet TAKE 1 TABLET BY MOUTH AT 6 AM,  10 AM, 2 PM AND 6 PM 400 tablet 0   diltiazem (TIAZAC) 360 MG 24 hr capsule TAKE 1 CAPSULE BY MOUTH DAILY 100 capsule 3   ELIQUIS 5 MG TABS tablet TAKE 1 TABLET BY MOUTH TWICE  DAILY 200 tablet 2   Magnesium 400 MG CAPS Take 1 tablet by mouth daily.     mirabegron ER (MYRBETRIQ) 25 MG TB24 tablet Take 1 tablet (25 mg total) by mouth daily. 30 tablet 0   Multiple Vitamins-Minerals (CENTRUM SILVER 50+MEN) TABS Take by mouth daily.     niacin 500 MG tablet Take 500 mg by mouth 2 (two) times daily.     perindopril (ACEON) 8 MG tablet Take 8 mg by mouth daily.     polyethylene glycol (MIRALAX) 17 g packet Take 17 g by mouth daily. 14 each 0   promethazine (PHENERGAN) 12.5 MG tablet Take 12.5 mg by mouth every 6 (six) hours as needed for nausea or vomiting.     tamsulosin (FLOMAX) 0.4 MG CAPS capsule Take 1 capsule (0.4 mg total) by mouth daily. 30 capsule 0   No current facility-administered medications for this visit.     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    Tremor     Past Surgical History:  Procedure Laterality Date   CLEFT LIP REPAIR     CLEFT PALATE REPAIR     SKIN GRAFT FULL THICKNESS LEG  12/2018    Social History   Socioeconomic History   Marital status: Married    Spouse name: Not on file   Number of children: 2   Years of education: Not on file   Highest education level: Bachelor's degree (e.g., BA, AB, BS)  Occupational History   Occupation: retired    Comment: Aeronautical engineer - [employer]  Tobacco Use   Smoking status: Former    Current packs/day: 0.00    Types: Cigarettes    Quit date: 12/20/2012    Years since quitting: 10.6   Smokeless tobacco: Never  Vaping Use   Vaping status: Never Used  Substance and Sexual Activity   Alcohol use: No   Drug use: No   Sexual activity: Not Currently  Other Topics Concern   Not on file  Social History Narrative   Not on file   Social Drivers of Health   Financial Resource Strain: Not on file  Food Insecurity:  Not on file  Transportation Needs: Not on file  Physical Activity: Not on file  Stress: Not on file  Social Connections: Not on file  Intimate Partner Violence: Not on file    Family History  Problem Relation Age of Onset   Heart attack Mother 18   Heart disease Sister    Melanoma Sister    Heart attack Brother    Healthy Daughter    Healthy Daughter    Stomach cancer Neg Hx    Colon cancer Neg Hx     ROS: no fevers or chills, productive cough, hemoptysis, dysphasia, odynophagia, melena, hematochezia, dysuria, hematuria, rash, seizure activity, orthopnea, PND, pedal edema, claudication. Remaining systems are negative.  Physical Exam: Well-developed well-nourished in no acute distress.  Skin is warm and dry.  HEENT is normal.  Neck is supple.  Chest is clear to auscultation with normal expansion.  Cardiovascular exam is regular rate and rhythm.  Abdominal exam nontender  or distended. No masses palpated. Extremities show no edema. neuro grossly intact  ECG- personally reviewed  A/P  1 paroxysmal atrial fibrillation-patient remains in sinus rhythm.  Continue Cardizem and apixaban.  We can consider addition of antiarrhythmic in the future for more frequent episodes versus referral for ablation.  We have previously discussed Watchman device given his Parkinson's and potential fall risk but he would prefer conservative management.  2 coronary calcification-noted on recent CT scan.  3 renal lesion-noted on recent abdominal CT.  Advised the patient to follow-up with primary care for this issue.  There is also recommendation for PSA.  4 hypertension-patient's blood pressure is controlled.  Continue present medical regimen.  5 Parkinson's-Per neurology.  Olga Millers, MD

## 2023-08-13 ENCOUNTER — Ambulatory Visit: Payer: Medicare Other | Attending: Cardiology | Admitting: Cardiology

## 2023-08-13 ENCOUNTER — Encounter: Payer: Self-pay | Admitting: Cardiology

## 2023-08-13 VITALS — BP 138/78 | HR 69 | Ht 69.0 in | Wt 171.0 lb

## 2023-08-13 DIAGNOSIS — I480 Paroxysmal atrial fibrillation: Secondary | ICD-10-CM

## 2023-08-13 DIAGNOSIS — I10 Essential (primary) hypertension: Secondary | ICD-10-CM

## 2023-08-13 NOTE — Patient Instructions (Signed)
   Follow-Up: At [facility], you and your health needs are our priority.  As part of our continuing mission to provide you with exceptional heart care, we have created designated Provider Care Teams.  These Care Teams include your primary Cardiologist (physician) and Advanced Practice Providers (APPs -  Physician Assistants and Nurse Practitioners) who all work together to provide you with the care you need, when you need it.    Your next appointment:   6 month(s)  Provider:   Olga Millers, MD

## 2023-08-19 ENCOUNTER — Telehealth: Payer: Self-pay | Admitting: Neurology

## 2023-08-19 NOTE — Progress Notes (Unsigned)
 Assessment/Plan:   1.  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  He will likely shake no matter what the Parkinsons Disease medication we use.  -increase walker use, esp if outside.  Some of inability to walk is due to SOB/DOE - he follows regularly with dermatology due to hx of BCC and SCC.   -refer to PT at [facility].  2.  Sialorrhea  -Not bad enough for intervention.  3.  PAF  -On Cardizem and Eliquis.  Following with cardiology.  -Cardiology has been discussing Watchman device with patient because of fall risk with Parkinsons, but ultimately patient has declined thus far.  4.  Nausea, intermittent  -seems unrelated to Parkinsons Disease and has now resolved  -Tums/pepcid seems to help  -Patient is to follow-up with Dr. Marina Goodell, and I recommend that he do that again if nausea persists.   Subjective:   Michael Odonnell was seen today in follow up for Parkinsons disease.  My previous records were reviewed prior to todays visit as well as outside records available to me. Wife with patient and supplements hx. his balance and stamina are not as good.  He would like to get back to physical therapy.  He has had no near syncope.  He saw Dr. Jens Som March 4.  Current prescribed movement disorder medications: carbidopa/levodopa 25/100, 6am/10am/2pm/6pm    ALLERGIES:   Allergies  Allergen Reactions   Codeine Nausea And Vomiting   Doxycycline Nausea Only   Metronidazole Nausea Only    CURRENT MEDICATIONS:  Outpatient Encounter Medications as of 08/21/2023  Medication Sig   acetaminophen (TYLENOL) 500 MG tablet Take 1,000 mg by mouth every 6 (six) hours as needed for mild pain or headache.    Calcium Carbonate Antacid (TUMS PO) Take 2 tablets by mouth daily as needed (heartburn).    carbidopa-levodopa (SINEMET IR)  25-100 MG tablet TAKE 1 TABLET BY MOUTH AT 6 AM,  10 AM, 2 PM AND 6 PM   diltiazem (TIAZAC) 360 MG 24 hr capsule TAKE 1 CAPSULE BY MOUTH DAILY   ELIQUIS 5 MG TABS tablet TAKE 1 TABLET BY MOUTH TWICE  DAILY   finasteride (PROSCAR) 5 MG tablet Take 5 mg by mouth daily.   Magnesium 400 MG CAPS Take 1 tablet by mouth daily.   Multiple Vitamins-Minerals (CENTRUM SILVER 50+MEN) TABS Take by mouth daily.   MYRBETRIQ 50 MG TB24 tablet Take 50 mg by mouth daily.   niacin 500 MG tablet Take 500 mg by mouth 2 (two) times daily.   perindopril (ACEON) 8 MG tablet Take 8 mg by mouth daily.   polyethylene glycol (MIRALAX) 17 g packet Take 17 g by mouth daily.   tamsulosin (FLOMAX) 0.4 MG CAPS capsule Take 1 capsule (0.4 mg total) by mouth daily. (Patient taking differently: Take 0.8 mg by mouth daily. Pt takes 2 0.4 mg tablets)   No facility-administered encounter medications on file as of 08/21/2023.    Objective:   PHYSICAL EXAMINATION:    VITALS:   There were no vitals filed for this visit.    GEN:  The patient appears stated age and is in NAD. HEENT:  Normocephalic, atraumatic.  The mucous membranes are moist. The superficial temporal arteries are without ropiness or tenderness. CV:  RRR Lungs:  CTAB.  He has DOE Neck/HEME:  There are no carotid bruits bilaterally.  Neurological examination:  Orientation: The patient is alert and oriented x3. Cranial nerves: There is good facial symmetry with facial hypomimia. Lips are parted.  The speech is fluent and clear. Soft palate rises symmetrically and there is no tongue deviation. Hearing is intact to conversational tone. Sensation: Sensation is intact to light touch throughout Motor: Strength is at least antigravity x4.  Movement examination: Tone: There is nl tone today Abnormal movements: L>RUE rest tremor; mild LLE rest tremor; there is tongue tremor Coordination:  There is no decremation with RAM's, with any form of RAMS, including  alternating supination and pronation of the forearm, hand opening and closing, finger taps, heel taps and toe taps. Gait and Station: The patient has mild difficulty arising out of a deep-seated chair without the use of the hands. The patient's stride length is good.   I have reviewed and interpreted the following labs independently    Chemistry      Component Value Date/Time   NA 138 04/12/2023 1531   NA 139 08/14/2022 1036   K 3.8 04/12/2023 1531   CL 103 04/12/2023 1531   CO2 26 04/12/2023 1531   BUN 18 04/12/2023 1531   BUN 24 08/14/2022 1036   CREATININE 0.92 04/12/2023 1531      Component Value Date/Time   CALCIUM 9.4 04/12/2023 1531   ALKPHOS 64 10/30/2019 1212   AST 21 10/30/2019 1212   ALT 18 10/30/2019 1212   BILITOT 1.3 (H) 10/30/2019 1212       Lab Results  Component Value Date   WBC 13.7 (H) 04/12/2023   HGB 10.3 (L) 04/12/2023   HCT 31.0 (L) 04/12/2023   MCV 91.7 04/12/2023   PLT 265 04/12/2023    Lab Results  Component Value Date   TSH 1.031 10/19/2019   Total time spent on today's visit was *** minutes, including both face-to-face time and nonface-to-face time.  Time included that spent on review of records (prior notes available to me/labs/imaging if pertinent), discussing treatment and goals, answering patient's questions and coordinating care.   Cc:  Adrian Prince, MD

## 2023-08-19 NOTE — Telephone Encounter (Signed)
 Wife of Pt cld and would like a Referral to Marlou Sa, PT [facility], from Dr.Tat

## 2023-08-21 ENCOUNTER — Ambulatory Visit: Admitting: Neurology

## 2023-08-21 VITALS — BP 124/76 | HR 89 | Ht 69.0 in | Wt 169.2 lb

## 2023-08-21 DIAGNOSIS — G20A1 Parkinson's disease without dyskinesia, without mention of fluctuations: Secondary | ICD-10-CM | POA: Diagnosis not present

## 2023-08-21 DIAGNOSIS — G20C Parkinsonism, unspecified: Secondary | ICD-10-CM

## 2023-08-21 DIAGNOSIS — M25511 Pain in right shoulder: Secondary | ICD-10-CM | POA: Diagnosis not present

## 2023-08-21 MED ORDER — CARBIDOPA-LEVODOPA 25-100 MG PO TABS: ORAL_TABLET | ORAL | 1 refills | Status: DC

## 2023-08-21 NOTE — Patient Instructions (Signed)
 Increase carbidopa/levodopa 25/100, 2 tablet at 6 AM/ and continue 1 at10 AM/2 PM/6 PM

## 2023-08-26 ENCOUNTER — Ambulatory Visit: Attending: Neurology | Admitting: Physical Therapy

## 2023-08-26 ENCOUNTER — Encounter: Payer: Self-pay | Admitting: Physical Therapy

## 2023-08-26 ENCOUNTER — Other Ambulatory Visit: Payer: Self-pay

## 2023-08-26 DIAGNOSIS — R29818 Other symptoms and signs involving the nervous system: Secondary | ICD-10-CM

## 2023-08-26 DIAGNOSIS — R293 Abnormal posture: Secondary | ICD-10-CM

## 2023-08-26 DIAGNOSIS — R2689 Other abnormalities of gait and mobility: Secondary | ICD-10-CM | POA: Diagnosis present

## 2023-08-26 DIAGNOSIS — M542 Cervicalgia: Secondary | ICD-10-CM | POA: Diagnosis present

## 2023-08-26 DIAGNOSIS — M6281 Muscle weakness (generalized): Secondary | ICD-10-CM | POA: Diagnosis present

## 2023-08-26 DIAGNOSIS — G20A1 Parkinson's disease without dyskinesia, without mention of fluctuations: Secondary | ICD-10-CM | POA: Insufficient documentation

## 2023-08-26 DIAGNOSIS — M25511 Pain in right shoulder: Secondary | ICD-10-CM | POA: Diagnosis not present

## 2023-08-26 DIAGNOSIS — R2681 Unsteadiness on feet: Secondary | ICD-10-CM | POA: Diagnosis present

## 2023-08-26 NOTE — Therapy (Signed)
 OUTPATIENT PHYSICAL THERAPY NEURO EVALUATION   Patient Name: Michael Odonnell MRN: [id number] DOB:25-Mar-1941, 83 y.o., male Today's Date: 08/26/2023   PCP: Adrian Prince, MD REFERRING PROVIDER: Vladimir Faster, DO   END OF SESSION:  PT End of Session - 08/26/23 0759     Visit Number 1    Number of Visits 12    Date for PT Re-Evaluation 10/04/23    Authorization Type [employer] Medicare    Progress Note Due on Visit 10    PT Start Time 0802    PT Stop Time 0848    PT Time Calculation (min) 46 min    Equipment Utilized During Treatment --   needs gait belt due to fall risk, unsteadiness   Activity Tolerance Patient tolerated treatment well    Behavior During Therapy WFL for tasks assessed/performed             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    Tremor    Past Surgical History:  Procedure Laterality Date   CLEFT LIP REPAIR     CLEFT PALATE REPAIR     SKIN GRAFT FULL THICKNESS LEG  12/2018   Patient Active Problem List   Diagnosis Date Noted   Chronic anticoagulation 11/10/2019   New onset atrial fibrillation (HCC) 10/19/2019   Essential hypertension 10/19/2019   Parkinson's disease (HCC) 10/19/2019   BPH (benign prostatic hyperplasia) 10/19/2019   Nausea 11/07/2017    ONSET DATE: 08/21/2023 (MD referral)  REFERRING DIAG:  G20.A1 (ICD-10-CM) - Parkinson's disease without dyskinesia or fluctuating manifestations (HCC)  M25.511 (ICD-10-CM) - Acute pain of right shoulder    THERAPY DIAG:  Abnormal posture  Cervicalgia  Muscle weakness (generalized)  Unsteadiness on feet  Other abnormalities of gait and mobility  Other symptoms and signs involving the nervous system  Rationale for Evaluation and Treatment:  Rehabilitation  SUBJECTIVE:                                                                                                                                                                                             SUBJECTIVE STATEMENT: Have a stiff neck and R shoulder muscles.  Several weeks ago, he was using the computer for a while and it worsened after that.  Back and hip on R side was hurting for 6 years and he  had PT, still does those exercises.  No falls, but wife reports his steps freeze sometimes.  Does have a walker and uses for [place] distances. *Pt is left-handed. Pt accompanied by: significant other  PERTINENT HISTORY: PD with levodopa resistant tremor; PAF, R scapular pain  PAIN:  Are you having pain? Yes: NPRS scale: "pretty high"-8/10 Pain location: neck, R shoulder Pain description: tightness, sharp, shooting Aggravating factors: raising arm/lifting Relieving factors: Tylenol  PRECAUTIONS: Fall  RED FLAGS: None   WEIGHT BEARING RESTRICTIONS: No  FALLS: Has patient fallen in last 6 months? No  LIVING ENVIRONMENT: Lives with: lives with their spouse Lives in: House/apartment Stairs: Yes: External: 2 steps; on right going up Has following equipment at home: Walker - 4 wheeled  PLOF: Independent with household mobility without device and Independent with community mobility with device  PATIENT GOALS: To get rid of the pain  OBJECTIVE:  Note: Objective measures were completed at Evaluation unless otherwise noted.  DIAGNOSTIC FINDINGS: NA for this episode  COGNITION: Overall cognitive status: Within functional limits for tasks assessed and some delayed responses for questions in PT eval   POSTURE: rounded shoulders, forward head, posterior pelvic tilt, and flexed trunk   CERVICAL ROM:  Flexion 40  Extension 10 *R neck pain  R Rotation 33 * R neck pain  L Rotation 25 *R neck pain  SHOULDER ROM:  R flexion:  68 degrees *R neck pain  R abduction  80  L flexion 100  L abduction 90  PALPATION:  Pt tender to palpation along R UT, with trigger points noted R UT>L UT  LOWER EXTREMITY ROM:   AROM WFL  LOWER EXTREMITY MMT:    MMT Right Eval Left Eval  Hip flexion 4 5  Hip extension    Hip abduction    Hip adduction    Hip internal rotation    Hip external rotation    Knee flexion 4 4  Knee extension 4 4  Ankle dorsiflexion 4 4  Ankle plantarflexion    Ankle inversion    Ankle eversion    (Blank rows = not tested)  BED MOBILITY:  NT  *sleeps in a recliner  TRANSFERS: Assistive device utilized: None  Sit to stand: SBA Stand to sit: SBA    GAIT: Gait pattern: step through pattern, decreased arm swing- Right, decreased arm swing- Left, decreased step length- Right, decreased step length- Left, and narrow BOS Distance walked: 50 ft x 2 Assistive device utilized: None Level of assistance: SBA and CGA Comments: reports episodes of freezing, not   FUNCTIONAL TESTS:  5 times sit to stand: 19.22 sec arms crossed at chest 10 meter walk test: 13.47 sec  (2.44 ft/sec)  PATIENT SURVEYS:  NDI 50% disability score                                                                                                                              TREATMENT DATE: 08/26/2023  PATIENT EDUCATION: Education details: Initial eval results; POC; discussed pain/postural changes contributing to pain and ways we will plan to address Person educated: Patient and Spouse Education method: Explanation, Demonstration, Verbal cues, and Handouts Education comprehension: verbalized understanding, returned demonstration, verbal cues required, and needs further education  HOME EXERCISE PROGRAM: Access Code: [email or web address] URL: [email or web address] Date: 08/26/2023 Prepared by: [facility] - [facility]  Exercises - Supine Scapular Retraction  - 1-2 x daily - 7 x weekly - 2 sets - 5 reps - 3 sec hold - Supine  Chin Tuck  - 1 x daily - 7 x weekly - 2 sets - 5 reps - 3 sec hold  GOALS: Goals reviewed with patient? Yes  SHORT TERM GOALS: Target date: 09/20/2023  Pt will be independent with HEP for improved posture, flexibility, decreased pain. Baseline: Goal status: INITIAL  2.  Pt will improve neck extension and shoulder flexion by 10 degrees for improved functional activities. Baseline:  Goal status: INITIAL  3.  Pt will improve 5x sit<>stand to less than or equal to 15 sec to demonstrate improved functional strength and transfer efficiency. Baseline:  Goal status: INITIAL   LONG TERM GOALS: Target date: 10/07/2023  Pt will be independent with progression of HEP for improved posture, balance, strength, flexibility, mobility. Baseline:  Goal status: INITIAL  2.  Pt will improve gait velocity to at least 2.62 ft/sec for improved gait efficiency and safety. Baseline:  Goal status: INITIAL  3.  Berg Balance test to be assessed and pt to improve by 8 points for decreased fall risk. Baseline: TBA Goal status: INITIAL  4.  Pt will improve NDI score by 10 points (or 20%) for improved neck pain and decreased limitation in functional daily activities. Baseline:  Goal status: INITIAL  5.  Pt will verbalize plans for continued community fitness upon d/c from PT to maximize gains made in PT. Baseline:  Goal status: INITIAL  ASSESSMENT:  CLINICAL IMPRESSION: Patient is an 83 y.o. male who was seen today for physical therapy evaluation and treatment for Parkinson's disease, for neck and shoulder pain.  Pt reports longstanding hx of R hip and low back pain (previously treated by PT 6 years ago) and recent increased pain in R shoulder and neck after prolonged sitting and computer work several weeks ago. He has hx of tremor-dominant PD, with tremors noted RUE>LUE.  Pt presents with decreased neck and shoulder flexibility, decreased strength, abnormal posture, muscle tightness/trigger point  involvement in R UT, decreased functional strength, decreased balance, decreased timing and coordination of gait.  Pt is at increased fall risk per FTSTS test and has limited community ambulator gait velocity.  He does have several LOB upon standing and turning during PT eval.  His primary concern at this time is his R shoulder and neck pain, which PT will plan to address.  Once pain is improved, pt would likely *also benefit from skilled OT services to address optimal posture and positioning with functional ADLS and upper body motions to decrease likelihood of pain in the future*.  Pt will benefit from PT to address the above stated deficits to improve overall functional mobility, participation in ADLs and functional daily routine, and to decrease fall risk.  OBJECTIVE IMPAIRMENTS: Abnormal gait, decreased balance, decreased mobility, difficulty walking, decreased ROM, decreased strength, increased muscle spasms, impaired flexibility, impaired UE functional use, postural dysfunction, and pain.   ACTIVITY LIMITATIONS: carrying, lifting, standing, sleeping, transfers, dressing, reach over head, and locomotion level  PARTICIPATION LIMITATIONS: shopping, community activity, and fitness  PERSONAL FACTORS: 3+ comorbidities: See PMH above  are also affecting patient's functional outcome.   REHAB POTENTIAL: Good  CLINICAL DECISION MAKING: Evolving/moderate complexity  EVALUATION COMPLEXITY: Moderate  PLAN:  PT FREQUENCY: 2x/week  PT DURATION: 6 weeks including eval week  PLANNED INTERVENTIONS: 97110-Therapeutic exercises, 97530- Therapeutic activity, [id number]- Neuromuscular re-education, 97535- Self Care, [id number]- Manual therapy, [id number]- Gait training, Patient/Family education, Balance training, Dry Needling, DME instructions, Cryotherapy, and Moist heat  PLAN FOR NEXT SESSION: review initial HEP and progress shoulder/neck exercises.  STM to UT and R neck/shoulder musculature; work on optimal UE  movements, postural stretching and strengthening.  When appropriate, start PWR! Moves-supine/sitting.  *Request OT orders for ADLs and UE flexibility/strengthening for when PT gets pain under control*   Aubry Rankin W., PT 08/26/2023, 9:53 AM  [facility] at [facility] [address] [place], [address] [place], [place] [address] Phone # [phone] Fax # [phone]

## 2023-08-29 ENCOUNTER — Ambulatory Visit

## 2023-08-30 ENCOUNTER — Encounter: Payer: Self-pay | Admitting: Physical Therapy

## 2023-08-30 ENCOUNTER — Ambulatory Visit: Admitting: Physical Therapy

## 2023-08-30 DIAGNOSIS — M542 Cervicalgia: Secondary | ICD-10-CM

## 2023-08-30 DIAGNOSIS — R293 Abnormal posture: Secondary | ICD-10-CM

## 2023-08-30 DIAGNOSIS — M6281 Muscle weakness (generalized): Secondary | ICD-10-CM

## 2023-08-30 NOTE — Therapy (Signed)
 OUTPATIENT PHYSICAL THERAPY NEURO EVALUATION   Patient Name: Michael Odonnell MRN: [id number] DOB:[birth year], 83 y.o., male Today's Date: 08/30/2023   PCP: Adrian Prince, MD REFERRING PROVIDER: Vladimir Faster, DO   END OF SESSION:  PT End of Session - 08/30/23 0933     Visit Number 2    Number of Visits 12    Date for PT Re-Evaluation 10/04/23    Authorization Type [employer] Medicare    Progress Note Due on Visit 10    PT Start Time 0933    PT Stop Time 1017    PT Time Calculation (min) 44 min    Equipment Utilized During Treatment --   needs gait belt due to fall risk, unsteadiness   Activity Tolerance Patient tolerated treatment well;Patient limited by pain    Behavior During Therapy WFL for tasks assessed/performed              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    Tremor    Past Surgical History:  Procedure Laterality Date   CLEFT LIP REPAIR     CLEFT PALATE REPAIR     SKIN GRAFT FULL THICKNESS LEG  12/2018   Patient Active Problem List   Diagnosis Date Noted   Chronic anticoagulation 11/10/2019   New onset atrial fibrillation (HCC) 10/19/2019   Essential hypertension 10/19/2019   Parkinson's disease (HCC) 10/19/2019   BPH (benign prostatic hyperplasia) 10/19/2019   Nausea 11/07/2017    ONSET DATE: 08/21/2023 (MD referral)  REFERRING DIAG:  G20.A1 (ICD-10-CM) - Parkinson's disease without dyskinesia or fluctuating manifestations (HCC)  M25.511 (ICD-10-CM) - Acute pain of right shoulder    THERAPY DIAG:  Abnormal posture  Cervicalgia  Muscle weakness (generalized)  Rationale for Evaluation and Treatment: Rehabilitation  SUBJECTIVE:                                                                                                                                                                                              SUBJECTIVE STATEMENT: Feel I overdid my exercises; still having some pain, like nerve pain.  Tried walking outside and back was bothering me, so I could hardly make it back. Pt accompanied by: significant other  PERTINENT HISTORY: PD with levodopa resistant tremor; PAF, R scapular pain  PAIN:  Are you having pain? Yes: NPRS scale:  8/10 Pain location: neck, R shoulder Pain description: tightness, sharp, shooting Aggravating factors: raising arm/lifting Relieving factors: Tylenol  PRECAUTIONS: Fall  RED FLAGS: None   WEIGHT BEARING RESTRICTIONS: No  FALLS: Has patient fallen in last 6 months? No  LIVING ENVIRONMENT: Lives with: lives with their spouse Lives in: House/apartment Stairs: Yes: External: 2 steps; on right going up Has following equipment at home: Walker - 4 wheeled  PLOF: Independent with household mobility without device and Independent with community mobility with device  PATIENT GOALS: To get rid of the pain  OBJECTIVE:    TODAY'S TREATMENT: 08/30/2023 Activity Comments  STM to suboccipitals, RUT with passive stretch Pt in supine, very tight  Supine scapular retraction, elbows straight x 5, then elbows flexed x 5 Good tolerance  Supine neck retraction, chin tuck Needs assistance for positioning of neck-as he hold head in R lateral tilt and rotation towards L  Seated PWR! UP 2 x 5, seated lateral rocking x 3 reps Good posture with PWR! Up  Seated posture/positioning and stretch: Horizontal shoulder adduction Scapular squeezes with elbows bent Chin tuck + neck rotation AROM/gentle stretch Lateral flexion stretch to L, 3 reps Towel roll behind back  Tightness noted R side with exercises      PATIENT EDUCATION: Education details: posture education and use of trunk motions forward/back/lateral to lessen shoulder pain and attempts at repetitive motion with  more forward flexed posture, updates to HEP Person educated: Patient and Spouse Education method: Explanation, Demonstration, Tactile cues, Verbal cues, and Handouts Education comprehension: verbalized understanding, returned demonstration, and needs further education ----------------------------------------------------------- Note: Objective measures were completed at Evaluation unless otherwise noted.  DIAGNOSTIC FINDINGS: NA for this episode  COGNITION: Overall cognitive status: Within functional limits for tasks assessed and some delayed responses for questions in PT eval   POSTURE: rounded shoulders, forward head, posterior pelvic tilt, and flexed trunk   CERVICAL ROM:  Flexion 40  Extension 10 *R neck pain  R Rotation 33 * R neck pain  L Rotation 25 *R neck pain  SHOULDER ROM:  R flexion:  68 degrees *R neck pain  R abduction 80  L flexion 100  L abduction 90  PALPATION:  Pt tender to palpation along R UT, with trigger points noted R UT>L UT  LOWER EXTREMITY ROM:   AROM WFL  LOWER EXTREMITY MMT:    MMT Right Eval Left Eval  Hip flexion 4 5  Hip extension    Hip abduction    Hip adduction    Hip internal rotation    Hip external rotation    Knee flexion 4 4  Knee extension 4 4  Ankle dorsiflexion 4 4  Ankle plantarflexion    Ankle inversion    Ankle eversion    (Blank rows = not tested)  BED MOBILITY:  NT  *sleeps in a recliner  TRANSFERS: Assistive device utilized: None  Sit to stand: SBA Stand to sit: SBA    GAIT: Gait pattern: step through pattern, decreased arm swing- Right, decreased arm swing- Left, decreased step length- Right, decreased step length- Left, and narrow BOS Distance walked: 50 ft x 2 Assistive device utilized: None Level of assistance: SBA and CGA Comments: reports episodes of freezing, not   FUNCTIONAL TESTS:  5 times sit to stand: 19.22 sec arms crossed at chest 10 meter walk test: 13.47 sec  (2.44 ft/sec)  PATIENT  SURVEYS:  NDI 50% disability score  TREATMENT DATE: 08/26/2023    PATIENT EDUCATION: Education details: Initial eval results; POC; discussed pain/postural changes contributing to pain and ways we will plan to address Person educated: Patient and Spouse Education method: Explanation, Demonstration, Verbal cues, and Handouts Education comprehension: verbalized understanding, returned demonstration, verbal cues required, and needs further education  HOME EXERCISE PROGRAM: Access Code: [email or web address] URL: [email or web address] Date: 08/26/2023 Prepared by: [facility] - Outpatient  Rehab - [facility]  Exercises - Supine Scapular Retraction  - 1-2 x daily - 7 x weekly - 2 sets - 5 reps - 3 sec hold - Supine Chin Tuck  - 1 x daily - 7 x weekly - 2 sets - 5 reps - 3 sec hold  GOALS: Goals reviewed with patient? Yes  SHORT TERM GOALS: Target date: 09/20/2023  Pt will be independent with HEP for improved posture, flexibility, decreased pain. Baseline: Goal status: IN PROGRESS  2.  Pt will improve neck extension and shoulder flexion by 10 degrees for improved functional activities. Baseline:  Goal status: IN PROGRESS  3.  Pt will improve 5x sit<>stand to less than or equal to 15 sec to demonstrate improved functional strength and transfer efficiency. Baseline:  Goal status: IN PROGRESS   LONG TERM GOALS: Target date: 10/07/2023  Pt will be independent with progression of HEP for improved posture, balance, strength, flexibility, mobility. Baseline:  Goal status: INITIAL  2.  Pt will improve gait velocity to at least 2.62 ft/sec for improved gait efficiency and safety. Baseline:  Goal status: INITIAL  3.  Berg Balance test to be assessed and pt to improve by 8 points for decreased fall risk. Baseline: TBA Goal status: INITIAL  4.  Pt will  improve NDI score by 10 points (or 20%) for improved neck pain and decreased limitation in functional daily activities. Baseline:  Goal status: INITIAL  5.  Pt will verbalize plans for continued community fitness upon d/c from PT to maximize gains made in PT. Baseline:  Goal status: INITIAL  ASSESSMENT:  CLINICAL IMPRESSION: Pt presents today reporting continued neck pain, and some pain into his back from a longer than usual walk earlier this week (no device and reports difficulty holding his head up. Skilled PT session focused on review of and progression of HEP for postural awareness, stretching, strengthening. Pt needs cues for upright posture through head and neck to initiate neck stretches, and continues to have significant tightness and tenderness along R UT. He does respond well to [facility]! Up exercise in sitting and to visual cues for posture, but he quickly reverts back to the very forward head posture.  Pt will continue to benefit from skilled PT towards goals for improved functional mobility and decreased pain.   OBJECTIVE IMPAIRMENTS: Abnormal gait, decreased balance, decreased mobility, difficulty walking, decreased ROM, decreased strength, increased muscle spasms, impaired flexibility, impaired UE functional use, postural dysfunction, and pain.   ACTIVITY LIMITATIONS: carrying, lifting, standing, sleeping, transfers, dressing, reach over head, and locomotion level  PARTICIPATION LIMITATIONS: shopping, community activity, and fitness  PERSONAL FACTORS: 3+ comorbidities: See PMH above  are also affecting patient's functional outcome.   REHAB POTENTIAL: Good  CLINICAL DECISION MAKING: Evolving/moderate complexity  EVALUATION COMPLEXITY: Moderate  PLAN:  PT FREQUENCY: 2x/week  PT DURATION: 6 weeks including eval week  PLANNED INTERVENTIONS: 97110-Therapeutic exercises, 97530- Therapeutic activity, [id number]- Neuromuscular re-education, 97535- Self Care, [id number]- Manual therapy,  95621- Gait training, Patient/Family education, Balance training, Dry Needling, DME instructions, Cryotherapy, and Moist heat  PLAN FOR NEXT SESSION:  review updates to HEP and progress shoulder/neck exercises.  STM to UT and R neck/shoulder musculature; work on optimal UE movements, postural stretching and strengthening.  When appropriate, start PWR! Moves-supine/sitting.  *Request OT orders for ADLs and UE flexibility/strengthening for when PT gets pain under control*   Faren Florence W., PT 08/30/2023, 11:54 AM  [facility] at [facility] [address] [place], [address] [place], [place] [address] Phone # [phone] Fax # [phone]

## 2023-09-02 ENCOUNTER — Ambulatory Visit

## 2023-09-02 DIAGNOSIS — R293 Abnormal posture: Secondary | ICD-10-CM | POA: Diagnosis not present

## 2023-09-02 DIAGNOSIS — R2689 Other abnormalities of gait and mobility: Secondary | ICD-10-CM

## 2023-09-02 DIAGNOSIS — M6281 Muscle weakness (generalized): Secondary | ICD-10-CM

## 2023-09-02 DIAGNOSIS — R2681 Unsteadiness on feet: Secondary | ICD-10-CM

## 2023-09-02 DIAGNOSIS — M542 Cervicalgia: Secondary | ICD-10-CM

## 2023-09-02 NOTE — Therapy (Signed)
 OUTPATIENT PHYSICAL THERAPY NEURO TREATMENT   Patient Name: Michael Odonnell MRN: [id number] DOB:1940-09-07, 83 y.o., male Today's Date: 09/02/2023   PCP: Adrian Prince, MD REFERRING PROVIDER: Vladimir Faster, DO   END OF SESSION:  PT End of Session - 09/02/23 1533     Visit Number 3    Number of Visits 12    Date for PT Re-Evaluation 10/04/23    Authorization Type [employer] Medicare    Progress Note Due on Visit 10    PT Start Time 1530    PT Stop Time 1615    PT Time Calculation (min) 45 min    Equipment Utilized During Treatment --   needs gait belt due to fall risk, unsteadiness   Activity Tolerance Patient tolerated treatment well;Patient limited by pain    Behavior During Therapy WFL for tasks assessed/performed              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    Tremor    Past Surgical History:  Procedure Laterality Date   CLEFT LIP REPAIR     CLEFT PALATE REPAIR     SKIN GRAFT FULL THICKNESS LEG  12/2018   Patient Active Problem List   Diagnosis Date Noted   Chronic anticoagulation 11/10/2019   New onset atrial fibrillation (HCC) 10/19/2019   Essential hypertension 10/19/2019   Parkinson's disease (HCC) 10/19/2019   BPH (benign prostatic hyperplasia) 10/19/2019   Nausea 11/07/2017    ONSET DATE: 08/21/2023 (MD referral)  REFERRING DIAG:  G20.A1 (ICD-10-CM) - Parkinson's disease without dyskinesia or fluctuating manifestations (HCC)  M25.511 (ICD-10-CM) - Acute pain of right shoulder    THERAPY DIAG:  Abnormal posture  Cervicalgia  Muscle weakness (generalized)  Unsteadiness on feet  Other abnormalities of gait and mobility  Rationale for Evaluation and Treatment: Rehabilitation  SUBJECTIVE:                                                                                                                                                                                              SUBJECTIVE STATEMENT: The neck is feeling a little better, more pain when raising the right arm. Pt accompanied by: significant other  PERTINENT HISTORY: PD with levodopa resistant tremor; PAF, R scapular pain  PAIN:  Are you having pain? Yes: NPRS scale: 8/10 Pain location:  neck, R shoulder Pain description: tightness, sharp, shooting Aggravating factors: raising arm/lifting Relieving factors: Tylenol  PRECAUTIONS: Fall  RED FLAGS: None   WEIGHT BEARING RESTRICTIONS: No  FALLS: Has patient fallen in last 6 months? No  LIVING ENVIRONMENT: Lives with: lives with their spouse Lives in: House/apartment Stairs: Yes: External: 2 steps; on right going up Has following equipment at home: Walker - 4 wheeled  PLOF: Independent with household mobility without device and Independent with community mobility with device  PATIENT GOALS: To get rid of the pain  OBJECTIVE:   TODAY'S TREATMENT: 09/02/23 Activity Comments  C-spine ROM 25 extension 20 left lateral flex 25 right lateral 25 left rotation 15 right rotation  Manual therapy -soft tissue mobilization c-spine with trigger point to right levator/upper trap and scalenes  -joint mobilization: grade 2-3 for lateral glide and downglide to improve rotation. Manual traction 10 sec hold x 20 reps  Supine neck extension isometric 1x10 Supine chin retraction 1x10 Cues for facilitation  Seated PWR moves 1x10 Visual demo for guidance and cues for sequence. Demo of Youtube video tutorial for home reference.                TODAY'S TREATMENT: 08/30/2023 Activity Comments  STM to suboccipitals, RUT with passive stretch Pt in supine, very tight  Supine scapular retraction, elbows straight x 5, then elbows flexed x 5 Good tolerance  Supine neck retraction, chin tuck Needs  assistance for positioning of neck-as he hold head in R lateral tilt and rotation towards L  Seated PWR! UP 2 x 5, seated lateral rocking x 3 reps Good posture with PWR! Up  Seated posture/positioning and stretch: Horizontal shoulder adduction Scapular squeezes with elbows bent Chin tuck + neck rotation AROM/gentle stretch Lateral flexion stretch to L, 3 reps Towel roll behind back  Tightness noted R side with exercises      PATIENT EDUCATION: Education details: posture education and use of trunk motions forward/back/lateral to lessen shoulder pain and attempts at repetitive motion with more forward flexed posture, updates to HEP Person educated: Patient and Spouse Education method: Explanation, Demonstration, Tactile cues, Verbal cues, and Handouts Education comprehension: verbalized understanding, returned demonstration, and needs further education ----------------------------------------------------------- Note: Objective measures were completed at Evaluation unless otherwise noted.  DIAGNOSTIC FINDINGS: NA for this episode  COGNITION: Overall cognitive status: Within functional limits for tasks assessed and some delayed responses for questions in PT eval   POSTURE: rounded shoulders, forward head, posterior pelvic tilt, and flexed trunk   CERVICAL ROM:  Flexion 40  Extension 10 *R neck pain  R Rotation 33 * R neck pain  L Rotation 25 *R neck pain  SHOULDER ROM:  R flexion:  68 degrees *R neck pain  R abduction 80  L flexion 100  L abduction 90  PALPATION:  Pt tender to palpation along R UT, with trigger points noted R UT>L UT  LOWER EXTREMITY ROM:   AROM WFL  LOWER EXTREMITY MMT:    MMT Right Eval Left Eval  Hip flexion 4 5  Hip extension    Hip abduction    Hip adduction    Hip internal rotation    Hip external rotation    Knee flexion 4 4  Knee extension 4 4  Ankle dorsiflexion 4 4  Ankle plantarflexion    Ankle inversion    Ankle eversion    (Blank  rows = not tested)  BED MOBILITY:  NT  *sleeps in a recliner  TRANSFERS: Assistive device  utilized: None  Sit to stand: SBA Stand to sit: SBA    GAIT: Gait pattern: step through pattern, decreased arm swing- Right, decreased arm swing- Left, decreased step length- Right, decreased step length- Left, and narrow BOS Distance walked: 50 ft x 2 Assistive device utilized: None Level of assistance: SBA and CGA Comments: reports episodes of freezing, not   FUNCTIONAL TESTS:  5 times sit to stand: 19.22 sec arms crossed at chest 10 meter walk test: 13.47 sec  (2.44 ft/sec)  PATIENT SURVEYS:  NDI 50% disability score                                                                                                                              TREATMENT DATE: 08/26/2023    PATIENT EDUCATION: Education details: Initial eval results; POC; discussed pain/postural changes contributing to pain and ways we will plan to address Person educated: Patient and Spouse Education method: Explanation, Demonstration, Verbal cues, and Handouts Education comprehension: verbalized understanding, returned demonstration, verbal cues required, and needs further education  HOME EXERCISE PROGRAM: Access Code: [email or web address] URL: [email or web address] Date: 08/26/2023 Prepared by: [facility] - Outpatient  Rehab - [facility]  Exercises - Supine Scapular Retraction  - 1-2 x daily - 7 x weekly - 2 sets - 5 reps - 3 sec hold - Supine Chin Tuck  - 1 x daily - 7 x weekly - 2 sets - 5 reps - 3 sec hold  --instructions for seated PWR moves  GOALS: Goals reviewed with patient? Yes  SHORT TERM GOALS: Target date: 09/20/2023  Pt will be independent with HEP for improved posture, flexibility, decreased pain. Baseline: Goal status: IN PROGRESS  2.  Pt will improve neck extension and shoulder flexion by 10 degrees for improved functional activities. Baseline:  Goal status: IN PROGRESS  3.  Pt will  improve 5x sit<>stand to less than or equal to 15 sec to demonstrate improved functional strength and transfer efficiency. Baseline:  Goal status: IN PROGRESS   LONG TERM GOALS: Target date: 10/07/2023  Pt will be independent with progression of HEP for improved posture, balance, strength, flexibility, mobility. Baseline:  Goal status: INITIAL  2.  Pt will improve gait velocity to at least 2.62 ft/sec for improved gait efficiency and safety. Baseline:  Goal status: INITIAL  3.  Berg Balance test to be assessed and pt to improve by 8 points for decreased fall risk. Baseline: TBA Goal status: INITIAL  4.  Pt will improve NDI score by 10 points (or 20%) for improved neck pain and decreased limitation in functional daily activities. Baseline:  Goal status: INITIAL  5.  Pt will verbalize plans for continued community fitness upon d/c from PT to maximize gains made in PT. Baseline:  Goal status: INITIAL  ASSESSMENT:  CLINICAL IMPRESSION: No change to c-spine ROM post-manual but reports decrease in neck pain with rotation post-tx and reinforced HEP for postural correction and reduced neck pain. Seated large  amplitude movements to improve coordination, flexibility, and motor control with need for consistent visual and verbal cues for sequence.  Continued sessions to progress POC details to reduce neck pain, improve balance/motor control, and reduce risk for falls.   OBJECTIVE IMPAIRMENTS: Abnormal gait, decreased balance, decreased mobility, difficulty walking, decreased ROM, decreased strength, increased muscle spasms, impaired flexibility, impaired UE functional use, postural dysfunction, and pain.   ACTIVITY LIMITATIONS: carrying, lifting, standing, sleeping, transfers, dressing, reach over head, and locomotion level  PARTICIPATION LIMITATIONS: shopping, community activity, and fitness  PERSONAL FACTORS: 3+ comorbidities: See PMH above  are also affecting patient's functional outcome.    REHAB POTENTIAL: Good  CLINICAL DECISION MAKING: Evolving/moderate complexity  EVALUATION COMPLEXITY: Moderate  PLAN:  PT FREQUENCY: 2x/week  PT DURATION: 6 weeks including eval week  PLANNED INTERVENTIONS: 97110-Therapeutic exercises, 97530- Therapeutic activity, 97112- Neuromuscular re-education, 97535- Self Care, 16109- Manual therapy, 60454- Gait training, Patient/Family education, Balance training, Dry Needling, DME instructions, Cryotherapy, and Moist heat  PLAN FOR NEXT SESSION: review updates to HEP and progress shoulder/neck exercises.  STM to UT and R neck/shoulder musculature; work on optimal UE movements, postural stretching and strengthening.  When appropriate, start PWR! Moves-supine/sitting.  *Request OT orders for ADLs and UE flexibility/strengthening for when PT gets pain under control*   Dion Body, PT 09/02/2023, 3:34 PM  [facility] Re[facility] at [facility] [address] [place], [address] [place], [place] [address] Phone # [phone] Fax # [phone]

## 2023-09-05 ENCOUNTER — Ambulatory Visit

## 2023-09-05 DIAGNOSIS — R2689 Other abnormalities of gait and mobility: Secondary | ICD-10-CM

## 2023-09-05 DIAGNOSIS — M542 Cervicalgia: Secondary | ICD-10-CM

## 2023-09-05 DIAGNOSIS — R2681 Unsteadiness on feet: Secondary | ICD-10-CM

## 2023-09-05 DIAGNOSIS — R293 Abnormal posture: Secondary | ICD-10-CM | POA: Diagnosis not present

## 2023-09-05 DIAGNOSIS — R29818 Other symptoms and signs involving the nervous system: Secondary | ICD-10-CM

## 2023-09-05 DIAGNOSIS — M6281 Muscle weakness (generalized): Secondary | ICD-10-CM

## 2023-09-05 NOTE — Therapy (Signed)
 OUTPATIENT PHYSICAL THERAPY NEURO TREATMENT   Patient Name: Michael Odonnell MRN: [id number] DOB:03/12/1941, 83 y.o., male Today's Date: 09/05/2023   PCP: Adrian Prince, MD REFERRING PROVIDER: Vladimir Faster, DO   END OF SESSION:  PT End of Session - 09/05/23 0923     Visit Number 4    Number of Visits 12    Date for PT Re-Evaluation 10/04/23    Authorization Type [employer] Medicare    Progress Note Due on Visit 10    PT Start Time 0930    PT Stop Time 1015    PT Time Calculation (min) 45 min    Equipment Utilized During Treatment --   needs gait belt due to fall risk, unsteadiness   Activity Tolerance Patient tolerated treatment well;Patient limited by pain    Behavior During Therapy WFL for tasks assessed/performed              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    Tremor    Past Surgical History:  Procedure Laterality Date   CLEFT LIP REPAIR     CLEFT PALATE REPAIR     SKIN GRAFT FULL THICKNESS LEG  12/2018   Patient Active Problem List   Diagnosis Date Noted   Chronic anticoagulation 11/10/2019   New onset atrial fibrillation (HCC) 10/19/2019   Essential hypertension 10/19/2019   Parkinson's disease (HCC) 10/19/2019   BPH (benign prostatic hyperplasia) 10/19/2019   Nausea 11/07/2017    ONSET DATE: 08/21/2023 (MD referral)  REFERRING DIAG:  G20.A1 (ICD-10-CM) - Parkinson's disease without dyskinesia or fluctuating manifestations (HCC)  M25.511 (ICD-10-CM) - Acute pain of right shoulder    THERAPY DIAG:  Abnormal posture  Cervicalgia  Muscle weakness (generalized)  Unsteadiness on feet  Other abnormalities of gait and mobility  Other symptoms and signs involving the nervous system  Rationale for Evaluation and  Treatment: Rehabilitation  SUBJECTIVE:                                                                                                                                                                                             SUBJECTIVE STATEMENT: Feeling a little better, neck is stiff. Pt accompanied by: significant other  PERTINENT HISTORY: PD with levodopa resistant tremor; PAF, R scapular pain  PAIN:  Are you having pain? Yes: NPRS scale: 5/10  Pain location: neck, R shoulder Pain description: tightness, sharp, shooting Aggravating factors: raising arm/lifting Relieving factors: Tylenol  PRECAUTIONS: Fall  RED FLAGS: None   WEIGHT BEARING RESTRICTIONS: No  FALLS: Has patient fallen in last 6 months? No  LIVING ENVIRONMENT: Lives with: lives with their spouse Lives in: House/apartment Stairs: Yes: External: 2 steps; on right going up Has following equipment at home: Walker - 4 wheeled  PLOF: Independent with household mobility without device and Independent with community mobility with device  PATIENT GOALS: To get rid of the pain  OBJECTIVE:   TODAY'S TREATMENT: 09/05/23 Activity Comments  Soft tissue mobilization x 15 min C-spine. Trigger point release right upper trap, levator, scalene, SCM. Contract-relax flexion-extension-lateral flexion Assisted extension isometrics Active-assisted chin retraction  Seated PWR moves 1x10 For flow W/ yellow resistance  Standing PWR moves 1x10 Instances of retro-LOB--instructed in techniques for safe home performance              TODAY'S TREATMENT: 09/02/23 Activity Comments  C-spine ROM 25 extension 20 left lateral flex 25 right lateral 25 left rotation 15 right rotation  Manual therapy -soft tissue mobilization c-spine with trigger point to right levator/upper trap and scalenes  -joint mobilization: grade 2-3 for lateral glide and downglide to improve rotation. Manual traction 10 sec hold x 20 reps  Supine neck  extension isometric 1x10 Supine chin retraction 1x10 Cues for facilitation  Seated PWR moves 1x10 Visual demo for guidance and cues for sequence. Demo of Youtube video tutorial for home reference.                 PATIENT EDUCATION: Education details: posture education and use of trunk motions forward/back/lateral to lessen shoulder pain and attempts at repetitive motion with more forward flexed posture, updates to HEP Person educated: Patient and Spouse Education method: Explanation, Demonstration, Tactile cues, Verbal cues, and Handouts Education comprehension: verbalized understanding, returned demonstration, and needs further education  PATIENT EDUCATION: Education details: Initial eval results; POC; discussed pain/postural changes contributing to pain and ways we will plan to address Person educated: Patient and Spouse Education method: Explanation, Demonstration, Verbal cues, and Handouts Education comprehension: verbalized understanding, returned demonstration, verbal cues required, and needs further education  HOME EXERCISE PROGRAM: Access Code: [email or web address] URL: [email or web address] Date: 08/26/2023 Prepared by: [facility] - Outpatient  Rehab - [facility]  Exercises - Supine Scapular Retraction  - 1-2 x daily - 7 x weekly - 2 sets - 5 reps - 3 sec hold - Supine Chin Tuck  - 1 x daily - 7 x weekly - 2 sets - 5 reps - 3 sec hold  --instructions for seated PWR moves  --instructions for standing PWR moves ----------------------------------------------------------- Note: Objective measures were completed at Evaluation unless otherwise noted.  DIAGNOSTIC FINDINGS: NA for this episode  COGNITION: Overall cognitive status: Within functional limits for tasks assessed and some delayed responses for questions in PT eval   POSTURE: rounded shoulders, forward head, posterior pelvic tilt, and flexed trunk   CERVICAL ROM:  Flexion 40  Extension 10 *R neck  pain  R Rotation 33 * R neck pain  L Rotation 25 *R neck pain  SHOULDER ROM:  R flexion:  68 degrees *R neck pain  R abduction 80  L flexion 100  L abduction 90  PALPATION:  Pt tender to palpation along R UT, with trigger points noted R UT>L UT  LOWER EXTREMITY ROM:   AROM WFL  LOWER EXTREMITY MMT:    MMT Right Eval  Left Eval  Hip flexion 4 5  Hip extension    Hip abduction    Hip adduction    Hip internal rotation    Hip external rotation    Knee flexion 4 4  Knee extension 4 4  Ankle dorsiflexion 4 4  Ankle plantarflexion    Ankle inversion    Ankle eversion    (Blank rows = not tested)  BED MOBILITY:  NT  *sleeps in a recliner  TRANSFERS: Assistive device utilized: None  Sit to stand: SBA Stand to sit: SBA    GAIT: Gait pattern: step through pattern, decreased arm swing- Right, decreased arm swing- Left, decreased step length- Right, decreased step length- Left, and narrow BOS Distance walked: 50 ft x 2 Assistive device utilized: None Level of assistance: SBA and CGA Comments: reports episodes of freezing, not   FUNCTIONAL TESTS:  5 times sit to stand: 19.22 sec arms crossed at chest 10 meter walk test: 13.47 sec  (2.44 ft/sec)  PATIENT SURVEYS:  NDI 50% disability score                                                                                                                              TREATMENT DATE: 08/26/2023     GOALS: Goals reviewed with patient? Yes  SHORT TERM GOALS: Target date: 09/20/2023  Pt will be independent with HEP for improved posture, flexibility, decreased pain. Baseline: Goal status: IN PROGRESS  2.  Pt will improve neck extension and shoulder flexion by 10 degrees for improved functional activities. Baseline:  Goal status: IN PROGRESS  3.  Pt will improve 5x sit<>stand to less than or equal to 15 sec to demonstrate improved functional strength and transfer efficiency. Baseline:  Goal status: IN  PROGRESS   LONG TERM GOALS: Target date: 10/07/2023  Pt will be independent with progression of HEP for improved posture, balance, strength, flexibility, mobility. Baseline:  Goal status: INITIAL  2.  Pt will improve gait velocity to at least 2.62 ft/sec for improved gait efficiency and safety. Baseline:  Goal status: INITIAL  3.  Berg Balance test to be assessed and pt to improve by 8 points for decreased fall risk. Baseline: TBA Goal status: INITIAL  4.  Pt will improve NDI score by 10 points (or 20%) for improved neck pain and decreased limitation in functional daily activities. Baseline:  Goal status: INITIAL  5.  Pt will verbalize plans for continued community fitness upon d/c from PT to maximize gains made in PT. Baseline:  Goal status: INITIAL  ASSESSMENT:  CLINICAL IMPRESSION: Pt reports some improvement to neck pain. Palpation reveals tenderness to right shoulder girdle/cervical column with palpable trigger points throughout. Soft tissue mobilization to reduce pain and improve mobility and contract-relax techniques followed by PROM to improve ROM carryover and reinforced positioning for postural re-education to good effect in supine position for chin retraction.  Review of seated large amplitude movements for improved flexibility, coordination, and  mobility and reinforced with yellow resistance for seated suite of movement and initiated the same large amplitude in standing with instances of retro-LOB under these demands when transitioning foot positions.  Demonstration of safe set-up for home practice of these in standing with pt and spouse verbalizing understanding. Continued sessions to address neck pain and improve balance/mobility and reduce risk for falls  OBJECTIVE IMPAIRMENTS: Abnormal gait, decreased balance, decreased mobility, difficulty walking, decreased ROM, decreased strength, increased muscle spasms, impaired flexibility, impaired UE functional use, postural  dysfunction, and pain.   ACTIVITY LIMITATIONS: carrying, lifting, standing, sleeping, transfers, dressing, reach over head, and locomotion level  PARTICIPATION LIMITATIONS: shopping, community activity, and fitness  PERSONAL FACTORS: 3+ comorbidities: See PMH above  are also affecting patient's functional outcome.   REHAB POTENTIAL: Good  CLINICAL DECISION MAKING: Evolving/moderate complexity  EVALUATION COMPLEXITY: Moderate  PLAN:  PT FREQUENCY: 2x/week  PT DURATION: 6 weeks including eval week  PLANNED INTERVENTIONS: 97110-Therapeutic exercises, 97530- Therapeutic activity, 97112- Neuromuscular re-education, 97535- Self Care, 95284- Manual therapy, 13244- Gait training, Patient/Family education, Balance training, Dry Needling, DME instructions, Cryotherapy, and Moist heat  PLAN FOR NEXT SESSION: review updates to HEP and progress shoulder/neck exercises.  STM to UT and R neck/shoulder musculature; work on optimal UE movements, postural stretching and strengthening.  When appropriate, start PWR! Moves-supine/sitting.  *Request OT orders for ADLs and UE flexibility/strengthening for when PT gets pain under control*   Dion Body, PT 09/05/2023, 9:24 AM  [facility] at [facility] [address] [place], [address] [place], [place] [address] Phone # [phone] Fax # [phone]

## 2023-09-09 ENCOUNTER — Ambulatory Visit: Admitting: Physical Therapy

## 2023-09-09 ENCOUNTER — Encounter: Payer: Self-pay | Admitting: Physical Therapy

## 2023-09-09 DIAGNOSIS — M6281 Muscle weakness (generalized): Secondary | ICD-10-CM

## 2023-09-09 DIAGNOSIS — R2681 Unsteadiness on feet: Secondary | ICD-10-CM

## 2023-09-09 DIAGNOSIS — M542 Cervicalgia: Secondary | ICD-10-CM

## 2023-09-09 DIAGNOSIS — R293 Abnormal posture: Secondary | ICD-10-CM

## 2023-09-09 NOTE — Therapy (Signed)
 OUTPATIENT PHYSICAL THERAPY NEURO TREATMENT   Patient Name: Michael Odonnell MRN: [id number] DOB:[birth year], 83 y.o., male Today's Date: 09/09/2023   PCP: Adrian Prince, MD REFERRING PROVIDER: Vladimir Faster, DO   END OF SESSION:  PT End of Session - 09/09/23 0848     Visit Number 5    Number of Visits 12    Date for PT Re-Evaluation 10/04/23    Authorization Type [employer] Medicare    Progress Note Due on Visit 10    PT Start Time 0849    PT Stop Time 0929    PT Time Calculation (min) 40 min    Equipment Utilized During Treatment --   needs gait belt due to fall risk, unsteadiness   Activity Tolerance Patient tolerated treatment well    Behavior During Therapy WFL for tasks assessed/performed               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    Tremor    Past Surgical History:  Procedure Laterality Date   CLEFT LIP REPAIR     CLEFT PALATE REPAIR     SKIN GRAFT FULL THICKNESS LEG  12/2018   Patient Active Problem List   Diagnosis Date Noted   Chronic anticoagulation 11/10/2019   New onset atrial fibrillation (HCC) 10/19/2019   Essential hypertension 10/19/2019   Parkinson's disease (HCC) 10/19/2019   BPH (benign prostatic hyperplasia) 10/19/2019   Nausea 11/07/2017    ONSET DATE: 08/21/2023 (MD referral)  REFERRING DIAG:  G20.A1 (ICD-10-CM) - Parkinson's disease without dyskinesia or fluctuating manifestations (HCC)  M25.511 (ICD-10-CM) - Acute pain of right shoulder    THERAPY DIAG:  Abnormal posture  Cervicalgia  Muscle weakness (generalized)  Unsteadiness on feet  Rationale for Evaluation and Treatment: Rehabilitation  SUBJECTIVE:                                                                                                                                                                                              SUBJECTIVE STATEMENT: Feeling a little better, don't have the sharp pains that I used.  Something is working-no pain in the shoulder. Pt accompanied by: significant other  PERTINENT HISTORY: PD with levodopa resistant tremor; PAF, R scapular pain  PAIN:  Are you having pain? Yes: NPRS scale: 0/10 Pain location: neck, R shoulder  Pain description: tightness Aggravating factors: tremors, PD symptoms Relieving factors: Tylenol  PRECAUTIONS: Fall  RED FLAGS: None   WEIGHT BEARING RESTRICTIONS: No  FALLS: Has patient fallen in last 6 months? No  LIVING ENVIRONMENT: Lives with: lives with their spouse Lives in: House/apartment Stairs: Yes: External: 2 steps; on right going up Has following equipment at home: Walker - 4 wheeled  PLOF: Independent with household mobility without device and Independent with community mobility with device  PATIENT GOALS: To get rid of the pain  OBJECTIVE:   TODAY'S TREATMENT: 09/09/2023 Activity Comments  Soft tissue mobilization x 15 min C-spine. Trigger point release right upper trap, levator, scalene, SCM. Contract-relax flexion-extension-lateral flexion Assisted extension isometrics Active-assisted chin retraction  Supine scapular retraction with elbows bent, 5 reps   Supine shoulder flexion x 5 reps   Seated PWR moves 1x10 Yellow theraband for PWR! Up  Standing at wall, chin tuck at purple ball, 2 x 5 reps   Sit to stand 2 x 5 reps with PWR! Up posture Cues to look ahead to target  Gait with cues to look ahead at target More difficulty with sustaining head position  Attempted stepping activities: forward/back step Alternating forward step and look ahead  Decreased step length posteriorly Difficulty sequencing step and head motion     TODAY'S TREATMENT: 09/05/23 Activity Comments  Soft tissue mobilization x 15 min C-spine. Trigger point  release right upper trap, levator, scalene, SCM. Contract-relax flexion-extension-lateral flexion Assisted extension isometrics Active-assisted chin retraction  Seated PWR moves 1x10 For flow W/ yellow resistance  Standing PWR moves 1x10 Instances of retro-LOB--instructed in techniques for safe home performance              TODAY'S TREATMENT: 09/02/23 Activity Comments  C-spine ROM 25 extension 20 left lateral flex 25 right lateral 25 left rotation 15 right rotation  Manual therapy -soft tissue mobilization c-spine with trigger point to right levator/upper trap and scalenes  -joint mobilization: grade 2-3 for lateral glide and downglide to improve rotation. Manual traction 10 sec hold x 20 reps  Supine neck extension isometric 1x10 Supine chin retraction 1x10 Cues for facilitation  Seated PWR moves 1x10 Visual demo for guidance and cues for sequence. Demo of Youtube video tutorial for home reference.                 PATIENT EDUCATION: Education details: Review of and plans to continue current HEP; postural awareness and correction Person educated: Patient and Spouse Education method: Explanation, Demonstration, Tactile cues, Verbal cues, and Handouts Education comprehension: verbalized understanding, returned demonstration, and needs further education   HOME EXERCISE PROGRAM: Access Code: [email or web address] URL: [email or web address] Date: 08/26/2023 Prepared by: [facility] - Outpatient  Rehab - [facility]  Exercises - Supine Scapular Retraction  - 1-2 x daily - 7 x weekly - 2 sets - 5 reps - 3 sec hold - Supine Chin Tuck  - 1 x daily - 7 x weekly - 2 sets - 5 reps - 3 sec hold  --instructions for seated PWR moves  --instructions for standing PWR moves ----------------------------------------------------------- Note: Objective measures were completed at Evaluation unless otherwise noted.  DIAGNOSTIC FINDINGS: NA for this episode  COGNITION: Overall  cognitive status: Within functional limits for tasks assessed and some delayed responses for questions in PT eval   POSTURE: rounded shoulders, forward head, posterior pelvic tilt, and flexed trunk   CERVICAL ROM:  Flexion 40  Extension 10 *R neck pain  R Rotation 33 * R neck  pain  L Rotation 25 *R neck pain  SHOULDER ROM:  R flexion:  68 degrees *R neck pain  R abduction 80  L flexion 100  L abduction 90  PALPATION:  Pt tender to palpation along R UT, with trigger points noted R UT>L UT  LOWER EXTREMITY ROM:   AROM WFL  LOWER EXTREMITY MMT:    MMT Right Eval Left Eval  Hip flexion 4 5  Hip extension    Hip abduction    Hip adduction    Hip internal rotation    Hip external rotation    Knee flexion 4 4  Knee extension 4 4  Ankle dorsiflexion 4 4  Ankle plantarflexion    Ankle inversion    Ankle eversion    (Blank rows = not tested)  BED MOBILITY:  NT  *sleeps in a recliner  TRANSFERS: Assistive device utilized: None  Sit to stand: SBA Stand to sit: SBA    GAIT: Gait pattern: step through pattern, decreased arm swing- Right, decreased arm swing- Left, decreased step length- Right, decreased step length- Left, and narrow BOS Distance walked: 50 ft x 2 Assistive device utilized: None Level of assistance: SBA and CGA Comments: reports episodes of freezing, not   FUNCTIONAL TESTS:  5 times sit to stand: 19.22 sec arms crossed at chest 10 meter walk test: 13.47 sec  (2.44 ft/sec)  PATIENT SURVEYS:  NDI 50% disability score                                                                                                                              TREATMENT DATE: 08/26/2023     GOALS: Goals reviewed with patient? Yes  SHORT TERM GOALS: Target date: 09/20/2023  Pt will be independent with HEP for improved posture, flexibility, decreased pain. Baseline: Goal status: IN PROGRESS  2.  Pt will improve neck extension and shoulder flexion by 10 degrees for  improved functional activities. Baseline:  Goal status: IN PROGRESS  3.  Pt will improve 5x sit<>stand to less than or equal to 15 sec to demonstrate improved functional strength and transfer efficiency. Baseline:  Goal status: IN PROGRESS   LONG TERM GOALS: Target date: 10/07/2023  Pt will be independent with progression of HEP for improved posture, balance, strength, flexibility, mobility. Baseline:  Goal status: INITIAL  2.  Pt will improve gait velocity to at least 2.62 ft/sec for improved gait efficiency and safety. Baseline:  Goal status: INITIAL  3.  Berg Balance test to be assessed and pt to improve by 8 points for decreased fall risk. Baseline: TBA Goal status: INITIAL  4.  Pt will improve NDI score by 10 points (or 20%) for improved neck pain and decreased limitation in functional daily activities. Baseline:  Goal status: INITIAL  5.  Pt will verbalize plans for continued community fitness upon d/c from PT to maximize gains made in PT. Baseline:  Goal status: INITIAL  ASSESSMENT:  CLINICAL  IMPRESSION: Pt presents today with reports of overall decreased pain in neck and shoulder area. Skilled PT session focused on manual therapy, plus active whole body movement patterns incorporating posture and head motions.  With standing and stepping activities, pt has more trouble sequencing and maintaining upright posture at neck.  He will continue to benefit from skilled PT towards goals for improved neck pain, functional mobility and decreased fall risk.   OBJECTIVE IMPAIRMENTS: Abnormal gait, decreased balance, decreased mobility, difficulty walking, decreased ROM, decreased strength, increased muscle spasms, impaired flexibility, impaired UE functional use, postural dysfunction, and pain.   ACTIVITY LIMITATIONS: carrying, lifting, standing, sleeping, transfers, dressing, reach over head, and locomotion level  PARTICIPATION LIMITATIONS: shopping, community activity, and  fitness  PERSONAL FACTORS: 3+ comorbidities: See PMH above  are also affecting patient's functional outcome.   REHAB POTENTIAL: Good  CLINICAL DECISION MAKING: Evolving/moderate complexity  EVALUATION COMPLEXITY: Moderate  PLAN:  PT FREQUENCY: 2x/week  PT DURATION: 6 weeks including eval week  PLANNED INTERVENTIONS: 97110-Therapeutic exercises, 97530- Therapeutic activity, 97112- Neuromuscular re-education, 97535- Self Care, 16109- Manual therapy, [phone]- Gait training, Patient/Family education, Balance training, Dry Needling, DME instructions, Cryotherapy, and Moist heat  PLAN FOR NEXT SESSION: Continue to review and progress HEP and progress shoulder/neck exercises.  STM to UT and R neck/shoulder musculature; work on optimal UE movements, postural stretching and strengthening.  Continue PWR! Moves-sitting, try supine.  *Request OT orders for ADLs and UE flexibility/strengthening for when PT gets pain under control*   Cabe Lashley W., PT 09/09/2023, 2:06 PM  [facility] at [facility] [address] [place], [address] [place], [place] [address] Phone # [phone] Fax # [phone]

## 2023-09-11 ENCOUNTER — Ambulatory Visit: Payer: Medicare Other | Admitting: Neurology

## 2023-09-12 ENCOUNTER — Ambulatory Visit: Attending: Neurology

## 2023-09-12 DIAGNOSIS — R2689 Other abnormalities of gait and mobility: Secondary | ICD-10-CM | POA: Diagnosis present

## 2023-09-12 DIAGNOSIS — R29818 Other symptoms and signs involving the nervous system: Secondary | ICD-10-CM | POA: Insufficient documentation

## 2023-09-12 DIAGNOSIS — M542 Cervicalgia: Secondary | ICD-10-CM | POA: Insufficient documentation

## 2023-09-12 DIAGNOSIS — M6281 Muscle weakness (generalized): Secondary | ICD-10-CM | POA: Diagnosis present

## 2023-09-12 DIAGNOSIS — R293 Abnormal posture: Secondary | ICD-10-CM | POA: Insufficient documentation

## 2023-09-12 DIAGNOSIS — R2681 Unsteadiness on feet: Secondary | ICD-10-CM | POA: Insufficient documentation

## 2023-09-12 NOTE — Therapy (Signed)
 OUTPATIENT PHYSICAL THERAPY NEURO TREATMENT   Patient Name: Michael Odonnell MRN: [id number] DOB:January 08, 1941, 83 y.o., male Today's Date: 09/12/2023   PCP: Adrian Prince, MD REFERRING PROVIDER: Vladimir Faster, DO   END OF SESSION:  PT End of Session - 09/12/23 0928     Visit Number 6    Number of Visits 12    Date for PT Re-Evaluation 10/04/23    Authorization Type [employer] Medicare    Progress Note Due on Visit 10    PT Start Time 0930    PT Stop Time 1015    PT Time Calculation (min) 45 min    Equipment Utilized During Treatment --   needs gait belt due to fall risk, unsteadiness   Activity Tolerance Patient tolerated treatment well    Behavior During Therapy WFL for tasks assessed/performed               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    Tremor    Past Surgical History:  Procedure Laterality Date   CLEFT LIP REPAIR     CLEFT PALATE REPAIR     SKIN GRAFT FULL THICKNESS LEG  12/2018   Patient Active Problem List   Diagnosis Date Noted   Chronic anticoagulation 11/10/2019   New onset atrial fibrillation (HCC) 10/19/2019   Essential hypertension 10/19/2019   Parkinson's disease (HCC) 10/19/2019   BPH (benign prostatic hyperplasia) 10/19/2019   Nausea 11/07/2017    ONSET DATE: 08/21/2023 (MD referral)  REFERRING DIAG:  G20.A1 (ICD-10-CM) - Parkinson's disease without dyskinesia or fluctuating manifestations (HCC)  M25.511 (ICD-10-CM) - Acute pain of right shoulder    THERAPY DIAG:  Abnormal posture  Cervicalgia  Muscle weakness (generalized)  Unsteadiness on feet  Other abnormalities of gait and mobility  Other symptoms and signs involving the nervous system  Rationale for Evaluation and Treatment:  Rehabilitation  SUBJECTIVE:                                                                                                                                                                                             SUBJECTIVE STATEMENT: Neck is feeling better, sharp pains are gone, difficulty with holding head up high when walking Pt accompanied by: significant other  PERTINENT HISTORY: PD with levodopa resistant tremor; PAF, R scapular pain  PAIN:  Are  you having pain? Yes: NPRS scale: 0/10 Pain location: neck, R shoulder Pain description: tightness Aggravating factors: tremors, PD symptoms Relieving factors: Tylenol  PRECAUTIONS: Fall  RED FLAGS: None   WEIGHT BEARING RESTRICTIONS: No  FALLS: Has patient fallen in last 6 months? No  LIVING ENVIRONMENT: Lives with: lives with their spouse Lives in: House/apartment Stairs: Yes: External: 2 steps; on right going up Has following equipment at home: Walker - 4 wheeled  PLOF: Independent with household mobility without device and Independent with community mobility with device  PATIENT GOALS: To get rid of the pain  OBJECTIVE:   TODAY'S TREATMENT: 09/12/23 Activity Comments  Seated chin retractions Mirror for feedback improved performance and carryover  Seated chin retractions 10x 3 sec against yellow   Static balance -feet together EO/EC x 30 sec. Head turns 2x EO/EC on firm and foam -semi-tandem x 15 sec  Calf raise 1x10 EO/EC  Lateral step over half roll 1x10 W/ and without UE support  Forward backwards over half roll 1x10 1/ and without UE support  4-square step 3x tape lines clockwise/counter 3x w/ airex pad and 6" hurdle in quadrant 3/4     TODAY'S TREATMENT: 09/09/2023 Activity Comments  Soft tissue mobilization x 15 min C-spine. Trigger point release right upper trap, levator, scalene, SCM. Contract-relax flexion-extension-lateral flexion Assisted extension isometrics Active-assisted chin retraction  Supine  scapular retraction with elbows bent, 5 reps   Supine shoulder flexion x 5 reps   Seated PWR moves 1x10 Yellow theraband for PWR! Up  Standing at wall, chin tuck at purple ball, 2 x 5 reps   Sit to stand 2 x 5 reps with PWR! Up posture Cues to look ahead to target  Gait with cues to look ahead at target More difficulty with sustaining head position  Attempted stepping activities: forward/back step Alternating forward step and look ahead  Decreased step length posteriorly Difficulty sequencing step and head motion             PATIENT EDUCATION: Education details: Review of and plans to continue current HEP; postural awareness and correction Person educated: Patient and Spouse Education method: Explanation, Demonstration, Tactile cues, Verbal cues, and Handouts Education comprehension: verbalized understanding, returned demonstration, and needs further education   HOME EXERCISE PROGRAM: Access Code: [email or web address] URL: [email or web address] Date: 08/26/2023 Prepared by: [facility] - Outpatient  Rehab - [facility]  Exercises - Supine Scapular Retraction  - 1-2 x daily - 7 x weekly - 2 sets - 5 reps - 3 sec hold - Supine Chin Tuck  - 1 x daily - 7 x weekly - 2 sets - 5 reps - 3 sec hold  --instructions for seated PWR moves  --instructions for standing PWR moves ----------------------------------------------------------- Note: Objective measures were completed at Evaluation unless otherwise noted.  DIAGNOSTIC FINDINGS: NA for this episode  COGNITION: Overall cognitive status: Within functional limits for tasks assessed and some delayed responses for questions in PT eval   POSTURE: rounded shoulders, forward head, posterior pelvic tilt, and flexed trunk   CERVICAL ROM:  Flexion 40  Extension 10 *R neck pain  R Rotation 33 * R neck pain  L Rotation 25 *R neck pain  SHOULDER ROM:  R flexion:  68 degrees *R neck pain  R abduction 80  L flexion 100  L  abduction 90  PALPATION:  Pt tender to palpation along R UT, with trigger points noted R UT>L UT  LOWER EXTREMITY ROM:   AROM WFL  LOWER EXTREMITY MMT:  MMT Right Eval Left Eval  Hip flexion 4 5  Hip extension    Hip abduction    Hip adduction    Hip internal rotation    Hip external rotation    Knee flexion 4 4  Knee extension 4 4  Ankle dorsiflexion 4 4  Ankle plantarflexion    Ankle inversion    Ankle eversion    (Blank rows = not tested)  BED MOBILITY:  NT  *sleeps in a recliner  TRANSFERS: Assistive device utilized: None  Sit to stand: SBA Stand to sit: SBA    GAIT: Gait pattern: step through pattern, decreased arm swing- Right, decreased arm swing- Left, decreased step length- Right, decreased step length- Left, and narrow BOS Distance walked: 50 ft x 2 Assistive device utilized: None Level of assistance: SBA and CGA Comments: reports episodes of freezing, not   FUNCTIONAL TESTS:  5 times sit to stand: 19.22 sec arms crossed at chest 10 meter walk test: 13.47 sec  (2.44 ft/sec)  PATIENT SURVEYS:  NDI 50% disability score                                                                                                                              TREATMENT DATE: 08/26/2023     GOALS: Goals reviewed with patient? Yes  SHORT TERM GOALS: Target date: 09/20/2023  Pt will be independent with HEP for improved posture, flexibility, decreased pain. Baseline: Goal status: IN PROGRESS  2.  Pt will improve neck extension and shoulder flexion by 10 degrees for improved functional activities. Baseline:  Goal status: IN PROGRESS  3.  Pt will improve 5x sit<>stand to less than or equal to 15 sec to demonstrate improved functional strength and transfer efficiency. Baseline:  Goal status: IN PROGRESS   LONG TERM GOALS: Target date: 10/07/2023  Pt will be independent with progression of HEP for improved posture, balance, strength, flexibility,  mobility. Baseline:  Goal status: INITIAL  2.  Pt will improve gait velocity to at least 2.62 ft/sec for improved gait efficiency and safety. Baseline:  Goal status: INITIAL  3.  Berg Balance test to be assessed and pt to improve by 8 points for decreased fall risk. Baseline: TBA Goal status: INITIAL  4.  Pt will improve NDI score by 10 points (or 20%) for improved neck pain and decreased limitation in functional daily activities. Baseline:  Goal status: INITIAL  5.  Pt will verbalize plans for continued community fitness upon d/c from PT to maximize gains made in PT. Baseline:  Goal status: INITIAL  ASSESSMENT:  CLINICAL IMPRESSION: Pt presents today with reports of overall decreased pain in neck and shoulder area. Instructed in postural re-education exercises in sitting to work against gravity with good reinforcement using mirror for visual feedback and keeping head upright.  Continued with balance training and motor planning activities to enhance single limb support, change of direction, and tolerance to multisensory balance conditions.  Overall, did  quite well with condition 4 M-CTSIB with mild-moderate sway but reports perception of LOB.  Difficulty with single limb support in retro-direction requiring UE support 50% of the time. Continued sessions to progress POC details toimprove mobility and reduce risk for falls  OBJECTIVE IMPAIRMENTS: Abnormal gait, decreased balance, decreased mobility, difficulty walking, decreased ROM, decreased strength, increased muscle spasms, impaired flexibility, impaired UE functional use, postural dysfunction, and pain.   ACTIVITY LIMITATIONS: carrying, lifting, standing, sleeping, transfers, dressing, reach over head, and locomotion level  PARTICIPATION LIMITATIONS: shopping, community activity, and fitness  PERSONAL FACTORS: 3+ comorbidities: See PMH above  are also affecting patient's functional outcome.   REHAB POTENTIAL: Good  CLINICAL  DECISION MAKING: Evolving/moderate complexity  EVALUATION COMPLEXITY: Moderate  PLAN:  PT FREQUENCY: 2x/week  PT DURATION: 6 weeks including eval week  PLANNED INTERVENTIONS: 97110-Therapeutic exercises, 97530- Therapeutic activity, 97112- Neuromuscular re-education, 97535- Self Care, 16109- Manual therapy, [phone]- Gait training, Patient/Family education, Balance training, Dry Needling, DME instructions, Cryotherapy, and Moist heat  PLAN FOR NEXT SESSION: Continue to review and progress HEP and progress shoulder/neck exercises.  STM to UT and R neck/shoulder musculature; work on optimal UE movements, postural stretching and strengthening.  Continue PWR! Moves-sitting, try supine.  *Request OT orders for ADLs and UE flexibility/strengthening for when PT gets pain under control*   Dion Body, PT 09/12/2023, 9:28 AM  [facility] at [facility] [address] [place], [address] [place], [place] [address] Phone # [phone] Fax # [phone]

## 2023-09-16 ENCOUNTER — Encounter: Payer: Self-pay | Admitting: Physical Therapy

## 2023-09-16 ENCOUNTER — Ambulatory Visit: Admitting: Physical Therapy

## 2023-09-16 DIAGNOSIS — R2681 Unsteadiness on feet: Secondary | ICD-10-CM

## 2023-09-16 DIAGNOSIS — M6281 Muscle weakness (generalized): Secondary | ICD-10-CM

## 2023-09-16 DIAGNOSIS — R293 Abnormal posture: Secondary | ICD-10-CM

## 2023-09-16 DIAGNOSIS — M542 Cervicalgia: Secondary | ICD-10-CM

## 2023-09-16 NOTE — Therapy (Signed)
 OUTPATIENT PHYSICAL THERAPY NEURO TREATMENT   Patient Name: Michael Odonnell MRN: [id number] DOB:13-Feb-1941, 83 y.o., male Today's Date: 09/16/2023   PCP: Adrian Prince, MD REFERRING PROVIDER: Vladimir Faster, DO   END OF SESSION:  PT End of Session - 09/16/23 0800     Visit Number 7    Number of Visits 12    Date for PT Re-Evaluation 10/04/23    Authorization Type [employer] Medicare    Progress Note Due on Visit 10    PT Start Time 0802    PT Stop Time 0843    PT Time Calculation (min) 41 min    Equipment Utilized During Treatment --   needs gait belt due to fall risk, unsteadiness   Activity Tolerance Patient tolerated treatment well    Behavior During Therapy WFL for tasks assessed/performed                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    Tremor    Past Surgical History:  Procedure Laterality Date   CLEFT LIP REPAIR     CLEFT PALATE REPAIR     SKIN GRAFT FULL THICKNESS LEG  12/2018   Patient Active Problem List   Diagnosis Date Noted   Chronic anticoagulation 11/10/2019   New onset atrial fibrillation (HCC) 10/19/2019   Essential hypertension 10/19/2019   Parkinson's disease (HCC) 10/19/2019   BPH (benign prostatic hyperplasia) 10/19/2019   Nausea 11/07/2017    ONSET DATE: 08/21/2023 (MD referral)  REFERRING DIAG:  G20.A1 (ICD-10-CM) - Parkinson's disease without dyskinesia or fluctuating manifestations (HCC)  M25.511 (ICD-10-CM) - Acute pain of right shoulder    THERAPY DIAG:  Abnormal posture  Cervicalgia  Muscle weakness (generalized)  Unsteadiness on feet  Rationale for Evaluation and Treatment: Rehabilitation  SUBJECTIVE:                                                                                                                                                                                              SUBJECTIVE STATEMENT: Pain is gone.  Just still can't hold up my neck. Pt accompanied by: significant other  PERTINENT HISTORY: PD with levodopa resistant tremor; PAF, R scapular pain  PAIN:  Are you having pain? Yes: NPRS scale: 0/10 Pain location: neck, R shoulder Pain description: tightness Aggravating factors: tremors, PD symptoms  Relieving factors: Tylenol  PRECAUTIONS: Fall  RED FLAGS: None   WEIGHT BEARING RESTRICTIONS: No  FALLS: Has patient fallen in last 6 months? No  LIVING ENVIRONMENT: Lives with: lives with their spouse Lives in: House/apartment Stairs: Yes: External: 2 steps; on right going up Has following equipment at home: Walker - 4 wheeled  PLOF: Independent with household mobility without device and Independent with community mobility with device  PATIENT GOALS: To get rid of the pain  OBJECTIVE:    TODAY'S TREATMENT: 09/16/2023 Activity Comments  Reviewed seated chin tucks, resisted band chin tucks Good return demo  Discussed and performed more upright posture   Modified quadruped PWR! Moves: PWR! Up x 10 PWR! Rock x 10 PWR! Twist x 10 PWR! Step x 10 At chair  Resisted foward walking-in parallel bars x 5 reps Blue band  Seated chin tuck, seated lateral flexion 5 reps       PATIENT EDUCATION: Education details: Posture/position changes through the day Person educated: Patient and Spouse Education method: Explanation, Demonstration, and Verbal cues Education comprehension: verbalized understanding, returned demonstration, and needs further education    TODAY'S TREATMENT: 09/12/23 Activity Comments  Seated chin retractions Mirror for feedback improved performance and carryover  Seated chin retractions 10x 3 sec against yellow   Static balance -feet together EO/EC x 30 sec. Head turns 2x EO/EC on firm and foam -semi-tandem x 15 sec  Calf  raise 1x10 EO/EC  Lateral step over half roll 1x10 W/ and without UE support  Forward backwards over half roll 1x10 1/ and without UE support  4-square step 3x tape lines clockwise/counter 3x w/ airex pad and 6" hurdle in quadrant 3/4     TODAY'S TREATMENT: 09/09/2023 Activity Comments  Soft tissue mobilization x 15 min C-spine. Trigger point release right upper trap, levator, scalene, SCM. Contract-relax flexion-extension-lateral flexion Assisted extension isometrics Active-assisted chin retraction  Supine scapular retraction with elbows bent, 5 reps   Supine shoulder flexion x 5 reps   Seated PWR moves 1x10 Yellow theraband for PWR! Up  Standing at wall, chin tuck at purple ball, 2 x 5 reps   Sit to stand 2 x 5 reps with PWR! Up posture Cues to look ahead to target  Gait with cues to look ahead at target More difficulty with sustaining head position  Attempted stepping activities: forward/back step Alternating forward step and look ahead  Decreased step length posteriorly Difficulty sequencing step and head motion             PATIENT EDUCATION: Education details: Review of and plans to continue current HEP; postural awareness and correction Person educated: Patient and Spouse Education method: Explanation, Demonstration, Tactile cues, Verbal cues, and Handouts Education comprehension: verbalized understanding, returned demonstration, and needs further education   HOME EXERCISE PROGRAM: Access Code: [email or web address] URL: [email or web address] Date: 08/26/2023 Prepared by: [facility] - Outpatient  Rehab - [facility]  Exercises - Supine Scapular Retraction  - 1-2 x daily - 7 x weekly - 2 sets - 5 reps - 3 sec hold - Supine Chin Tuck  - 1 x daily - 7 x weekly - 2 sets - 5 reps - 3 sec hold  --instructions for seated PWR moves  --instructions for standing PWR moves ----------------------------------------------------------- Note: Objective measures were  completed at Evaluation unless otherwise noted.  DIAGNOSTIC FINDINGS: NA for this episode  COGNITION: Overall cognitive status: Within functional limits for tasks assessed and some delayed responses for questions in PT eval   POSTURE: rounded shoulders,  forward head, posterior pelvic tilt, and flexed trunk   CERVICAL ROM:  Flexion 40  Extension 10 *R neck pain  R Rotation 33 * R neck pain  L Rotation 25 *R neck pain  SHOULDER ROM:  R flexion:  68 degrees *R neck pain  R abduction 80  L flexion 100  L abduction 90  PALPATION:  Pt tender to palpation along R UT, with trigger points noted R UT>L UT  LOWER EXTREMITY ROM:   AROM WFL  LOWER EXTREMITY MMT:    MMT Right Eval Left Eval  Hip flexion 4 5  Hip extension    Hip abduction    Hip adduction    Hip internal rotation    Hip external rotation    Knee flexion 4 4  Knee extension 4 4  Ankle dorsiflexion 4 4  Ankle plantarflexion    Ankle inversion    Ankle eversion    (Blank rows = not tested)  BED MOBILITY:  NT  *sleeps in a recliner  TRANSFERS: Assistive device utilized: None  Sit to stand: SBA Stand to sit: SBA    GAIT: Gait pattern: step through pattern, decreased arm swing- Right, decreased arm swing- Left, decreased step length- Right, decreased step length- Left, and narrow BOS Distance walked: 50 ft x 2 Assistive device utilized: None Level of assistance: SBA and CGA Comments: reports episodes of freezing, not   FUNCTIONAL TESTS:  5 times sit to stand: 19.22 sec arms crossed at chest 10 meter walk test: 13.47 sec  (2.44 ft/sec)  PATIENT SURVEYS:  NDI 50% disability score                                                                                                                              TREATMENT DATE: 08/26/2023     GOALS: Goals reviewed with patient? Yes  SHORT TERM GOALS: Target date: 09/20/2023  Pt will be independent with HEP for improved posture, flexibility, decreased  pain. Baseline: Goal status: IN PROGRESS  2.  Pt will improve neck extension and shoulder flexion by 10 degrees for improved functional activities. Baseline:  Goal status: IN PROGRESS  3.  Pt will improve 5x sit<>stand to less than or equal to 15 sec to demonstrate improved functional strength and transfer efficiency. Baseline:  Goal status: IN PROGRESS   LONG TERM GOALS: Target date: 10/07/2023  Pt will be independent with progression of HEP for improved posture, balance, strength, flexibility, mobility. Baseline:  Goal status: INITIAL  2.  Pt will improve gait velocity to at least 2.62 ft/sec for improved gait efficiency and safety. Baseline:  Goal status: INITIAL  3.  Berg Balance test to be assessed and pt to improve by 8 points for decreased fall risk. Baseline: TBA Goal status: INITIAL  4.  Pt will improve NDI score by 10 points (or 20%) for improved neck pain and decreased limitation in functional daily activities. Baseline:  Goal status: INITIAL  5.  Pt will verbalize plans for continued community fitness upon d/c from PT to maximize gains made in PT. Baseline:  Goal status: INITIAL  ASSESSMENT:  CLINICAL IMPRESSION: Pt presents today with no new complaints. Skilled PT session focused on review of recent seated chin tuck additions and then education/practice to incorporate better overall posture and more full body activation. Pt needs cues for proper technique of exercises and his neck muscles fatigue through visit.  Pt will continue to benefit from skilled PT towards goals for improved functional mobility and decreased fall risk.   OBJECTIVE IMPAIRMENTS: Abnormal gait, decreased balance, decreased mobility, difficulty walking, decreased ROM, decreased strength, increased muscle spasms, impaired flexibility, impaired UE functional use, postural dysfunction, and pain.   ACTIVITY LIMITATIONS: carrying, lifting, standing, sleeping, transfers, dressing, reach over head,  and locomotion level  PARTICIPATION LIMITATIONS: shopping, community activity, and fitness  PERSONAL FACTORS: 3+ comorbidities: See PMH above  are also affecting patient's functional outcome.   REHAB POTENTIAL: Good  CLINICAL DECISION MAKING: Evolving/moderate complexity  EVALUATION COMPLEXITY: Moderate  PLAN:  PT FREQUENCY: 2x/week  PT DURATION: 6 weeks including eval week  PLANNED INTERVENTIONS: 97110-Therapeutic exercises, 97530- Therapeutic activity, 97112- Neuromuscular re-education, 97535- Self Care, [id number]- Manual therapy, [phone]- Gait training, Patient/Family education, Balance training, Dry Needling, DME instructions, Cryotherapy, and Moist heat  PLAN FOR NEXT SESSION: *Forward/back stepping with reaching/looking to incorporate whole body movements; Check STGs; discuss POC and consider adding one more week of appts Whole body movements, postural stretching and strengthening.  Continue PWR! Moves-sitting, try supine.  *Ask pt if they request OT orders for ADLs and UE flexibility/strengthening for when PT gets pain under control*   Halley Shepheard W., PT 09/16/2023, 8:43 AM  [facility] at [facility] [address] [place], [address] [place], [place] [address] Phone # [phone] Fax # [phone]

## 2023-09-18 ENCOUNTER — Ambulatory Visit

## 2023-09-18 DIAGNOSIS — R2681 Unsteadiness on feet: Secondary | ICD-10-CM

## 2023-09-18 DIAGNOSIS — R293 Abnormal posture: Secondary | ICD-10-CM | POA: Diagnosis not present

## 2023-09-18 DIAGNOSIS — M542 Cervicalgia: Secondary | ICD-10-CM

## 2023-09-18 DIAGNOSIS — R2689 Other abnormalities of gait and mobility: Secondary | ICD-10-CM

## 2023-09-18 DIAGNOSIS — M6281 Muscle weakness (generalized): Secondary | ICD-10-CM

## 2023-09-18 NOTE — Therapy (Signed)
 OUTPATIENT PHYSICAL THERAPY NEURO TREATMENT and D/C Summary   Patient Name: Michael Odonnell MRN: [id number] DOB:May 07, 1941, 83 y.o., male Today's Date: 09/18/2023   PCP: Adrian Prince, MD REFERRING PROVIDER: Tat, Octaviano Batty, DO   PHYSICAL THERAPY DISCHARGE SUMMARY  Visits from Start of Care: 8  Current functional level related to goals / functional outcomes: Reports 0/10 neck pain, other goals not met due to unexpected D/C   Remaining deficits: Postural dysfunction, gait/balance deficits   Education / Equipment: HEP initiated   Patient agrees to discharge. Patient goals were not met. Patient is being discharged due to being pleased with the current functional level.  END OF SESSION:  PT End of Session - 09/18/23 0931     Visit Number 8    Number of Visits 12    Date for PT Re-Evaluation 10/04/23    Authorization Type [employer] Medicare    Progress Note Due on Visit 10    PT Start Time 0930    PT Stop Time 1015    PT Time Calculation (min) 45 min    Equipment Utilized During Treatment --   needs gait belt due to fall risk, unsteadiness   Activity Tolerance Patient tolerated treatment well    Behavior During Therapy WFL for tasks assessed/performed                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    Tremor    Past Surgical History:  Procedure Laterality Date   CLEFT LIP REPAIR     CLEFT PALATE REPAIR     SKIN GRAFT FULL THICKNESS LEG  12/2018   Patient Active Problem List   Diagnosis Date Noted   Chronic anticoagulation 11/10/2019   New onset atrial fibrillation (HCC) 10/19/2019   Essential hypertension 10/19/2019   Parkinson's disease (HCC) 10/19/2019   BPH (benign prostatic hyperplasia) 10/19/2019   Nausea 11/07/2017     ONSET DATE: 08/21/2023 (MD referral)  REFERRING DIAG:  G20.A1 (ICD-10-CM) - Parkinson's disease without dyskinesia or fluctuating manifestations (HCC)  M25.511 (ICD-10-CM) - Acute pain of right shoulder    THERAPY DIAG:  Abnormal posture  Cervicalgia  Muscle weakness (generalized)  Unsteadiness on feet  Other abnormalities of gait and mobility  Rationale for Evaluation and Treatment: Rehabilitation  SUBJECTIVE:  SUBJECTIVE STATEMENT: Pain is gone.  Just still can't hold up my neck. Pt accompanied by: significant other  PERTINENT HISTORY: PD with levodopa resistant tremor; PAF, R scapular pain  PAIN:  Are you having pain? Yes: NPRS scale: 0/10 Pain location: neck, R shoulder Pain description: tightness Aggravating factors: tremors, PD symptoms Relieving factors: Tylenol  PRECAUTIONS: Fall  RED FLAGS: None   WEIGHT BEARING RESTRICTIONS: No  FALLS: Has patient fallen in last 6 months? No  LIVING ENVIRONMENT: Lives with: lives with their spouse Lives in: House/apartment Stairs: Yes: External: 2 steps; on right going up Has following equipment at home: Walker - 4 wheeled  PLOF: Independent with household mobility without device and Independent with community mobility with device  PATIENT GOALS: To get rid of the pain  OBJECTIVE:   TODAY'S TREATMENT: 09/18/23 Activity Comments  Seated upper body strength 2x10 Work to failure at 6-10 reps 15-25# with unilat and bilat movements  Seated hamstring curls 1x10 10, 15, 15#  Step-ups 2x10, 8" box+ HR W/ dual-task demands  Multi-sensory balance    4-square step          TODAY'S TREATMENT: 09/16/2023 Activity Comments  Reviewed seated chin tucks, resisted band chin tucks Good return demo  Discussed and performed more upright  posture   Modified quadruped PWR! Moves: PWR! Up x 10 PWR! Rock x 10 PWR! Twist x 10 PWR! Step x 10 At chair  Resisted foward walking-in parallel bars x 5 reps Blue band  Seated chin tuck, seated lateral flexion 5 reps       PATIENT EDUCATION: Education details: Posture/position changes through the day Person educated: Patient and Spouse Education method: Explanation, Demonstration, and Verbal cues Education comprehension: verbalized understanding, returned demonstration, and needs further education    TODAY'S TREATMENT: 09/12/23 Activity Comments  Seated chin retractions Mirror for feedback improved performance and carryover  Seated chin retractions 10x 3 sec against yellow   Static balance -feet together EO/EC x 30 sec. Head turns 2x EO/EC on firm and foam -semi-tandem x 15 sec  Calf raise 1x10 EO/EC  Lateral step over half roll 1x10 W/ and without UE support  Forward backwards over half roll 1x10 1/ and without UE support  4-square step 3x tape lines clockwise/counter 3x w/ airex pad and 6" hurdle in quadrant 3/4     TODAY'S TREATMENT: 09/09/2023 Activity Comments  Soft tissue mobilization x 15 min C-spine. Trigger point release right upper trap, levator, scalene, SCM. Contract-relax flexion-extension-lateral flexion Assisted extension isometrics Active-assisted chin retraction  Supine scapular retraction with elbows bent, 5 reps   Supine shoulder flexion x 5 reps   Seated PWR moves 1x10 Yellow theraband for PWR! Up  Standing at wall, chin tuck at purple ball, 2 x 5 reps   Sit to stand 2 x 5 reps with PWR! Up posture Cues to look ahead to target  Gait with cues to look ahead at target More difficulty with sustaining head position  Attempted stepping activities: forward/back step Alternating forward step and look ahead  Decreased step length posteriorly Difficulty sequencing step and head motion             PATIENT EDUCATION: Education details: Review of and  plans to continue current HEP; postural awareness and correction Person educated: Patient and Spouse Education method: Explanation, Demonstration, Tactile cues, Verbal cues, and Handouts Education comprehension: verbalized understanding, returned demonstration, and needs further education   HOME EXERCISE PROGRAM: Access Code: [email or web address] URL: [email or web address] Date: 08/26/2023 Prepared by: [facility] -  Outpatient  Rehab - [facility]  Exercises - Supine Scapular Retraction  - 1-2 x daily - 7 x weekly - 2 sets - 5 reps - 3 sec hold - Supine Chin Tuck  - 1 x daily - 7 x weekly - 2 sets - 5 reps - 3 sec hold  --instructions for seated PWR moves  --instructions for standing PWR moves ----------------------------------------------------------- Note: Objective measures were completed at Evaluation unless otherwise noted.  DIAGNOSTIC FINDINGS: NA for this episode  COGNITION: Overall cognitive status: Within functional limits for tasks assessed and some delayed responses for questions in PT eval   POSTURE: rounded shoulders, forward head, posterior pelvic tilt, and flexed trunk   CERVICAL ROM:  Flexion 40  Extension 10 *R neck pain  R Rotation 33 * R neck pain  L Rotation 25 *R neck pain  SHOULDER ROM:  R flexion:  68 degrees *R neck pain  R abduction 80  L flexion 100  L abduction 90  PALPATION:  Pt tender to palpation along R UT, with trigger points noted R UT>L UT  LOWER EXTREMITY ROM:   AROM WFL  LOWER EXTREMITY MMT:    MMT Right Eval Left Eval  Hip flexion 4 5  Hip extension    Hip abduction    Hip adduction    Hip internal rotation    Hip external rotation    Knee flexion 4 4  Knee extension 4 4  Ankle dorsiflexion 4 4  Ankle plantarflexion    Ankle inversion    Ankle eversion    (Blank rows = not tested)  BED MOBILITY:  NT  *sleeps in a recliner  TRANSFERS: Assistive device utilized: None  Sit to stand: SBA Stand to sit:  SBA    GAIT: Gait pattern: step through pattern, decreased arm swing- Right, decreased arm swing- Left, decreased step length- Right, decreased step length- Left, and narrow BOS Distance walked: 50 ft x 2 Assistive device utilized: None Level of assistance: SBA and CGA Comments: reports episodes of freezing, not   FUNCTIONAL TESTS:  5 times sit to stand: 19.22 sec arms crossed at chest 10 meter walk test: 13.47 sec  (2.44 ft/sec)  PATIENT SURVEYS:  NDI 50% disability score                                                                                                                              TREATMENT DATE: 08/26/2023     GOALS: Goals reviewed with patient? Yes  SHORT TERM GOALS: Target date: 09/20/2023  Pt will be independent with HEP for improved posture, flexibility, decreased pain. Baseline: Goal status: MET  2.  Pt will improve neck extension and shoulder flexion by 10 degrees for improved functional activities. Baseline:  Goal status: NOT MET  3.  Pt will improve 5x sit<>stand to less than or equal to 15 sec to demonstrate improved functional strength and transfer efficiency. Baseline:  Goal status: DNT  LONG TERM GOALS: Target date: 10/07/2023  Pt will be independent with progression of HEP for improved posture, balance, strength, flexibility, mobility. Baseline:  Goal status: DNT  2.  Pt will improve gait velocity to at least 2.62 ft/sec for improved gait efficiency and safety. Baseline:  Goal status: DNT  3.  Berg Balance test to be assessed and pt to improve by 8 points for decreased fall risk. Baseline: TBA Goal status: DNT  4.  Pt will improve NDI score by 10 points (or 20%) for improved neck pain and decreased limitation in functional daily activities. Baseline:  Goal status: DNT  5.  Pt will verbalize plans for continued community fitness upon d/c from PT to maximize gains made in PT. Baseline:  Goal status: DNT  ASSESSMENT:  CLINICAL  IMPRESSION: Focus on strength training for improved postural strength/endurance.  Step-up tasks with dual-tasking demands to improve coordination and mobility with diverted attention with 75% success.  Multisensory balance activities to improve postural stability and facilitate righting reactions.  At end of session pt and spouse report they intend to D/C at this time and maintain HEP at home with large amplitude exercises.  Provided with additional resistance band to accompany this for progression of strength.  Will D/C at this time due to pt request  OBJECTIVE IMPAIRMENTS: Abnormal gait, decreased balance, decreased mobility, difficulty walking, decreased ROM, decreased strength, increased muscle spasms, impaired flexibility, impaired UE functional use, postural dysfunction, and pain.   ACTIVITY LIMITATIONS: carrying, lifting, standing, sleeping, transfers, dressing, reach over head, and locomotion level  PARTICIPATION LIMITATIONS: shopping, community activity, and fitness  PERSONAL FACTORS: 3+ comorbidities: See PMH above  are also affecting patient's functional outcome.   REHAB POTENTIAL: Good  CLINICAL DECISION MAKING: Evolving/moderate complexity  EVALUATION COMPLEXITY: Moderate  PLAN:  PT FREQUENCY: 2x/week  PT DURATION: 6 weeks including eval week  PLANNED INTERVENTIONS: 97110-Therapeutic exercises, 97530- Therapeutic activity, 97112- Neuromuscular re-education, 97535- Self Care, [id number]- Manual therapy, [phone]- Gait training, Patient/Family education, Balance training, Dry Needling, DME instructions, Cryotherapy, and Moist heat  PLAN FOR NEXT SESSION: D/C to HEP per pt request   Dion Body, PT 09/18/2023, 9:31 AM  [facility] at [facility] [address], [address] [place], [place] [address] Phone # [phone] Fax # [phone]

## 2023-09-24 ENCOUNTER — Ambulatory Visit

## 2023-09-26 ENCOUNTER — Ambulatory Visit

## 2023-11-01 LAB — LAB REPORT - SCANNED
EGFR: 52.5
TSH: 1.36 (ref 0.41–5.90)

## 2023-11-01 LAB — HEMOGLOBIN A1C: A1c: 6.2

## 2023-11-07 ENCOUNTER — Other Ambulatory Visit: Payer: Self-pay | Admitting: Cardiology

## 2024-01-17 NOTE — Progress Notes (Signed)
 Assessment/Plan:   1.  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  He will likely shake no matter what the Parkinsons Disease medication we use.  -increase walker use.  Discussed freezing.  Some of inability to walk is due to SOB/DOE - he follows regularly with dermatology due to hx of BCC and SCC.   - We discussed the day/night reversal and discussed regular daily schedule and exactly what that means.  2.  Sialorrhea  - He carries a drool cloth, but not interested in Botox.  3.  PAF  -On Cardizem  and Eliquis .  Following with cardiology.  -Cardiology has been discussing Watchman device with patient because of fall risk with Parkinsons, but ultimately patient has declined thus far.  4.  SOB  -? Etiology.  Unrelated to Parkinsons Disease.  Following with pcp and cardiology  5.  Urinary frequency  - Following with urology, Dr. Carolynn.  I do think some of his day/night reversal is related to frequent urination. Subjective:   Michael Odonnell was seen today in follow up for Parkinsons disease.  My previous records were reviewed prior to todays visit as well as outside records available to me. Wife with patient and supplements hx.  I just slightly increased with patient's levodopa  last visit (morning dose).  He tolerated that well, without side effects.  He has been to therapy since our last visit.  Those notes are reviewed.  No falls.  He is exercising.  No syncope or near syncope.  He does have freezing spells.  They bring labs from primary care.  They were overall fairly unremarkable.  States that they did not talk about shortness of breath.  He is sleeping a lot during the day, but is not sleeping a lot at night.  He is sleeping in a recliner.  He is up frequently to use the bathroom, but does not empty the  bladder a lot.  Current prescribed movement disorder medications: carbidopa /levodopa  25/100, 2 at 6am/1 at 10am/2pm/6pm    ALLERGIES:   Allergies  Allergen Reactions   Codeine Nausea And Vomiting   Doxycycline Nausea Only   Metronidazole Nausea Only    CURRENT MEDICATIONS:  Outpatient Encounter Medications as of 01/21/2024  Medication Sig   acetaminophen  (TYLENOL ) 500 MG tablet Take 1,000 mg by mouth every 6 (six) hours as needed for mild pain or headache.    Calcium  Carbonate Antacid (TUMS PO) Take 2 tablets by mouth daily as needed (heartburn).    carbidopa -levodopa  (SINEMET  IR) 25-100 MG tablet 2 at 6 AM, 1 at 10 AM, 2 PM AND 6 PM   diltiazem  (TIAZAC ) 360 MG 24 hr capsule TAKE 1 CAPSULE BY MOUTH DAILY   ELIQUIS  5 MG TABS tablet TAKE 1 TABLET BY MOUTH TWICE  DAILY   finasteride (PROSCAR) 5 MG tablet Take 5 mg by mouth daily.   Magnesium  400 MG CAPS Take 1 tablet by mouth daily.   Multiple Vitamins-Minerals (CENTRUM SILVER 50+MEN) TABS Take by mouth daily.   MYRBETRIQ  50 MG TB24 tablet Take 50 mg by mouth daily.   niacin  500 MG tablet Take 500 mg by mouth 2 (two) times daily.   perindopril (ACEON) 8 MG tablet Take 8 mg by mouth daily.   polyethylene glycol (MIRALAX ) 17 g packet Take 17 g by mouth daily.   tamsulosin  (FLOMAX )  0.4 MG CAPS capsule Take 1 capsule (0.4 mg total) by mouth daily. (Patient taking differently: Take 0.8 mg by mouth daily. Pt takes 2 0.4 mg tablets)   No facility-administered encounter medications on file as of 01/21/2024.    Objective:   PHYSICAL EXAMINATION:    VITALS:   Vitals:   01/21/24 0832  BP: (!) 122/58  Pulse: 80  SpO2: 95%  Weight: 158 lb (71.7 kg)  Height: 5' 9 (1.753 m)    GEN:  The patient appears stated age and is in NAD. HEENT:  Normocephalic, atraumatic.  The mucous membranes are moist. The superficial temporal arteries are without ropiness or tenderness. CV:  RRR Lungs:  CTAB.  He has DOE (same as previous) Neck/HEME:   There are no carotid bruits bilaterally.  Neurological examination:  Orientation: The patient is alert and oriented x3. Cranial nerves: There is good facial symmetry with facial hypomimia. Lips are parted.  The speech is fluent and clear. Soft palate rises symmetrically and there is no tongue deviation. Hearing is intact to conversational tone. Sensation: Sensation is intact to light touch throughout Motor: Strength is at least antigravity x4.  Movement examination: Tone: There is mild increased tone in the RUE (due for medication in 30 min) Abnormal movements: L>RUE rest tremor; mild LLE rest tremor; there is tongue tremor (this is all stable) Coordination:  There is mild decremation with foot taps on the L.  All other RAMs are normal. Gait and Station: The patient pushes off to arise.  He ambulates well with the rollator.   I have reviewed and interpreted the following labs independently    Chemistry      Component Value Date/Time   NA 138 04/12/2023 1531   NA 139 08/14/2022 1036   K 3.8 04/12/2023 1531   CL 103 04/12/2023 1531   CO2 26 04/12/2023 1531   BUN 18 04/12/2023 1531   BUN 24 08/14/2022 1036   CREATININE 0.92 04/12/2023 1531      Component Value Date/Time   CALCIUM  9.4 04/12/2023 1531   ALKPHOS 64 10/30/2019 1212   AST 21 10/30/2019 1212   ALT 18 10/30/2019 1212   BILITOT 1.3 (H) 10/30/2019 1212       Lab Results  Component Value Date   WBC 13.7 (H) 04/12/2023   HGB 10.3 (L) 04/12/2023   HCT 31.0 (L) 04/12/2023   MCV 91.7 04/12/2023   PLT 265 04/12/2023    Lab Results  Component Value Date   TSH 1.031 10/19/2019   Total time spent on today's visit was 31 minutes, including both face-to-face time and nonface-to-face time.  Time included that spent on review of records (prior notes available to me/labs/imaging if pertinent), discussing treatment and goals, answering patient's questions and coordinating care.   Cc:  Nichole Senior, MD

## 2024-01-21 ENCOUNTER — Encounter: Payer: Self-pay | Admitting: Neurology

## 2024-01-21 ENCOUNTER — Ambulatory Visit: Admitting: Neurology

## 2024-01-21 VITALS — BP 122/58 | HR 80 | Ht 69.0 in | Wt 158.0 lb

## 2024-01-21 DIAGNOSIS — G20A1 Parkinson's disease without dyskinesia, without mention of fluctuations: Secondary | ICD-10-CM

## 2024-01-21 DIAGNOSIS — R351 Nocturia: Secondary | ICD-10-CM | POA: Diagnosis not present

## 2024-01-21 NOTE — Patient Instructions (Signed)

## 2024-02-12 ENCOUNTER — Other Ambulatory Visit: Payer: Self-pay | Admitting: Cardiology

## 2024-02-13 NOTE — Telephone Encounter (Signed)
 Prescription refill request for Eliquis  received. Indication: afib  Last office visit: [place] 08/13/2023 Scr: 0.92, 04/12/2023 Age: 83 yo  Weight: 71.7 kg   Refill sent.

## 2024-02-16 ENCOUNTER — Other Ambulatory Visit: Payer: Self-pay | Admitting: Neurology

## 2024-02-16 DIAGNOSIS — G20A1 Parkinson's disease without dyskinesia, without mention of fluctuations: Secondary | ICD-10-CM

## 2024-02-16 DIAGNOSIS — G20C Parkinsonism, unspecified: Secondary | ICD-10-CM

## 2024-02-26 ENCOUNTER — Telehealth: Payer: Self-pay | Admitting: Neurology

## 2024-02-26 ENCOUNTER — Encounter: Payer: Self-pay | Admitting: Neurology

## 2024-02-26 NOTE — Telephone Encounter (Signed)
 Pt. Wife lefted a message stating  that she sent a message through MyChart about her husband fall. . Wife stated that her husband fell  and now she would like to see if she can get physical therapy to come to their home , due to her husband needs help getting around  the home. Thanks.

## 2024-02-27 NOTE — Telephone Encounter (Signed)
 Responded via [employer]

## 2024-03-06 ENCOUNTER — Telehealth: Payer: Self-pay | Admitting: Neurology

## 2024-03-06 NOTE — Telephone Encounter (Signed)
 Called patients wife and gave advice from Dr. Leigh

## 2024-03-06 NOTE — Telephone Encounter (Signed)
 Pt's wife called in this morning. She wants your opinion on can they replace Tylenol    with Lavender 80 mg to help her husband ( pt ) relax more. Thanks

## 2024-05-05 ENCOUNTER — Emergency Department (HOSPITAL_COMMUNITY)
Admission: EM | Admit: 2024-05-05 | Discharge: 2024-05-06 | Disposition: A | Discharge status: Home / Self Care | Attending: Emergency Medicine | Admitting: Emergency Medicine

## 2024-05-05 ENCOUNTER — Encounter (HOSPITAL_COMMUNITY): Payer: Self-pay

## 2024-05-05 ENCOUNTER — Other Ambulatory Visit: Payer: Self-pay

## 2024-05-05 DIAGNOSIS — G20C Parkinsonism, unspecified: Secondary | ICD-10-CM | POA: Diagnosis not present

## 2024-05-05 DIAGNOSIS — I4891 Unspecified atrial fibrillation: Secondary | ICD-10-CM | POA: Diagnosis not present

## 2024-05-05 DIAGNOSIS — Z7901 Long term (current) use of anticoagulants: Secondary | ICD-10-CM | POA: Insufficient documentation

## 2024-05-05 DIAGNOSIS — I10 Essential (primary) hypertension: Secondary | ICD-10-CM | POA: Diagnosis not present

## 2024-05-05 DIAGNOSIS — K625 Hemorrhage of anus and rectum: Secondary | ICD-10-CM | POA: Insufficient documentation

## 2024-05-05 DIAGNOSIS — R58 Hemorrhage, not elsewhere classified: Secondary | ICD-10-CM

## 2024-05-05 MED ORDER — PANTOPRAZOLE SODIUM 40 MG IV SOLR
40.0000 mg | Freq: Once | INTRAVENOUS | Status: AC
  Administered 2024-05-06: 40 mg via INTRAVENOUS
  Filled 2024-05-05: qty 10

## 2024-05-05 NOTE — ED Triage Notes (Signed)
 BIB [facility] from home. Pt went to have a bowel movement around 30 minutes ago and noticed some blood on the floor as well as in his brief. Pt has a bed sore in the sacral area that is being treated.   Pt is on eliquis 

## 2024-05-05 NOTE — ED Provider Notes (Signed)
 [place] EMERGENCY DEPARTMENT AT [facility] Provider Note   CSN: [id number] Arrival date & time: 05/05/24  2324     Patient presents with: Rectal Bleeding   Michael Odonnell is a 83 y.o. male with history of chronic anticoagulation secondary to atrial fibrillation, hypertension, Parkinson's, tremor.  Presents to ED complaining of rectal bleeding.  Wife states that on November 13 she noted patient to have some blood dripping out of his briefs.  Reports that this resolved and then tonight noticed that he had blood dripping down his leg as well as blood in his briefs.  States she believes he is having blood coming from his rectum.  Patient denies history of hemorrhoids or rectal pain.  Denies abdominal pain, nausea or vomiting.  Denies fevers, diarrhea at home.  Reports chronic anticoagulation secondary to A-fib.  Denies chest pain or shortness of breath, leg swelling.  Denies lightheadedness, dizziness or weakness.  Reports that he also has sacral wound but this is in the course of healing and patient wife does not believe this is because of patient rectal bleeding.  She denies that he had blood in his stool.   Rectal Bleeding      Prior to Admission medications   Medication Sig Start Date End Date Taking? Authorizing Provider  acetaminophen  (TYLENOL ) 500 MG tablet Take 1,000 mg by mouth every 6 (six) hours as needed for mild pain or headache.     [provider]  apixaban  (ELIQUIS ) 5 MG TABS tablet TAKE 1 TABLET BY MOUTH TWICE  DAILY 02/13/24   Pietro Redell RAMAN, MD  Calcium  Carbonate Antacid (TUMS PO) Take 2 tablets by mouth daily as needed (heartburn).     [provider]  carbidopa -levodopa  (SINEMET  IR) 25-100 MG tablet TAKE 2 TABELTS BY MOUTH AT 6 AM, 1 AT 10 AM, 1 AT 2 PM, AND 1 AT 6 PM 02/17/24   Tat, Asberry RAMAN, DO  diltiazem  (CARDIZEM  CD) 360 MG 24 hr capsule take on etablet by mouth daily Oral Once a day; Duration: 90 days 11/03/19   [provider]   diltiazem  (TIAZAC ) 360 MG 24 hr capsule TAKE 1 CAPSULE BY MOUTH DAILY 11/07/23   Pietro Redell RAMAN, MD  finasteride (PROSCAR) 5 MG tablet Take 5 mg by mouth daily. 07/06/23   [provider]  Magnesium  400 MG CAPS Take 1 tablet by mouth daily.    [provider]  Multiple Vitamins-Minerals (CENTRUM SILVER 50+MEN) TABS Take by mouth daily.    [provider]  MYRBETRIQ  50 MG TB24 tablet Take 50 mg by mouth daily. 07/24/23   [provider]  niacin  500 MG tablet Take 500 mg by mouth 2 (two) times daily.    [provider]  perindopril (ACEON) 8 MG tablet Take 8 mg by mouth daily. 12/30/17   [provider]  polyethylene glycol (MIRALAX ) 17 g packet Take 17 g by mouth daily. 04/12/23   Mannie Pac T, DO  tamsulosin  (FLOMAX ) 0.4 MG CAPS capsule Take 1 capsule (0.4 mg total) by mouth daily. 12/22/16   Mackuen, Courteney Lyn, MD    Allergies: Codeine, Doxycycline, and Metronidazole    Review of Systems  Gastrointestinal:  Positive for hematochezia.    Updated Vital Signs BP (!) 157/84   Pulse 72   Resp 18   Ht 5' 8 (1.727 m)   Wt 72.6 kg   SpO2 99%   BMI 24.33 kg/m   Physical Exam  (all labs ordered are listed, but  only abnormal results are displayed) Labs Reviewed  CBC  COMPREHENSIVE METABOLIC PANEL WITH GFR  URINALYSIS, ROUTINE W REFLEX MICROSCOPIC  OCCULT BLOOD X 1 CARD TO LAB, STOOL  PROTIME-INR  TYPE AND SCREEN  ABO/RH    EKG: None  Radiology: No results found.  {Document cardiac monitor, telemetry assessment procedure when appropriate:32947} Procedures   Medications Ordered in the ED  pantoprazole  (PROTONIX ) injection 40 mg (has no administration in time range)      {Click here for ABCD2, HEART and other calculators REFRESH Note before signing:1}                              Medical Decision Making Amount and/or Complexity of Data Reviewed Labs: ordered. ECG/medicine tests: ordered.  Risk Prescription  drug management.   ***  {Document critical care time when appropriate  Document review of labs and clinical decision tools ie CHADS2VASC2, etc  Document your independent review of radiology images and any outside records  Document your discussion with family members, caretakers and with consultants  Document social determinants of health affecting pt's care  Document your decision making why or why not admission, treatments were needed:32947:::1}   Final diagnoses:  None    ED Discharge Orders     None

## 2024-05-06 ENCOUNTER — Emergency Department (HOSPITAL_COMMUNITY)

## 2024-05-06 LAB — URINALYSIS, ROUTINE W REFLEX MICROSCOPIC
Bacteria, UA: NONE SEEN
Bilirubin Urine: NEGATIVE
Glucose, UA: NEGATIVE mg/dL
Hgb urine dipstick: NEGATIVE
Ketones, ur: NEGATIVE mg/dL
Nitrite: NEGATIVE
Protein, ur: NEGATIVE mg/dL
Specific Gravity, Urine: 1.015 (ref 1.005–1.030)
pH: 6 (ref 5.0–8.0)

## 2024-05-06 LAB — COMPREHENSIVE METABOLIC PANEL WITH GFR
ALT: 7 U/L (ref 0–44)
AST: 16 U/L (ref 15–41)
Albumin: 3.9 g/dL (ref 3.5–5.0)
Alkaline Phosphatase: 80 U/L (ref 38–126)
Anion gap: 10 (ref 5–15)
BUN: 25 mg/dL — ABNORMAL HIGH (ref 8–23)
CO2: 27 mmol/L (ref 22–32)
Calcium: 9.3 mg/dL (ref 8.9–10.3)
Chloride: 103 mmol/L (ref 98–111)
Creatinine, Ser: 1.17 mg/dL (ref 0.61–1.24)
GFR, Estimated: >60 mL/min (ref >60)
Glucose, Bld: 134 mg/dL — ABNORMAL HIGH (ref 70–99)
Potassium: 4.2 mmol/L (ref 3.5–5.1)
Sodium: 140 mmol/L (ref 135–145)
Total Bilirubin: 0.5 mg/dL (ref 0.0–1.2)
Total Protein: 6.7 g/dL (ref 6.5–8.1)

## 2024-05-06 LAB — POC OCCULT BLOOD, ED
Fecal Occult Bld: NEGATIVE
Fecal Occult Bld: NEGATIVE

## 2024-05-06 LAB — CBC
HCT: 39.5 % (ref 39.0–52.0)
Hemoglobin: 12.8 g/dL — ABNORMAL LOW (ref 13.0–17.0)
MCH: 30.9 pg (ref 26.0–34.0)
MCHC: 32.4 g/dL (ref 30.0–36.0)
MCV: 95.4 fL (ref 80.0–100.0)
Platelets: 234 K/uL (ref 150–400)
RBC: 4.14 MIL/uL — ABNORMAL LOW (ref 4.22–5.81)
RDW: 13.1 % (ref 11.5–15.5)
WBC: 9.1 K/uL (ref 4.0–10.5)
nRBC: 0 % (ref 0.0–0.2)

## 2024-05-06 LAB — PROTIME-INR
INR: 1.2 (ref 0.8–1.2)
Prothrombin Time: 15.5 s — ABNORMAL HIGH (ref 11.4–15.2)

## 2024-05-06 LAB — TYPE AND SCREEN
ABO/RH(D): A NEG
Antibody Screen: NEGATIVE

## 2024-05-06 LAB — ABO/RH: ABO/RH(D): A NEG

## 2024-05-06 MED ORDER — IOHEXOL 350 MG/ML SOLN
75.0000 mL | Freq: Once | INTRAVENOUS | Status: AC | PRN
  Administered 2024-05-06: 75 mL via INTRAVENOUS

## 2024-05-06 NOTE — ED Notes (Signed)
 POC occult test done in mini lab. Results negative working on getting results in chart

## 2024-05-06 NOTE — Discharge Instructions (Addendum)
 As we discussed, your husband's workup here is reassuring.  Please have patient seen by PCP on Monday as we discussed.  If patient has continued bleeding, please return to ED.  I am referring you to GI.  You may follow-up with them for further care.

## 2024-05-18 ENCOUNTER — Other Ambulatory Visit: Payer: Self-pay | Admitting: Cardiology

## 2024-05-19 ENCOUNTER — Other Ambulatory Visit: Payer: Self-pay | Admitting: Neurology

## 2024-05-19 DIAGNOSIS — G20A1 Parkinson's disease without dyskinesia, without mention of fluctuations: Secondary | ICD-10-CM

## 2024-05-19 DIAGNOSIS — G20C Parkinsonism, unspecified: Secondary | ICD-10-CM

## 2024-05-23 ENCOUNTER — Other Ambulatory Visit: Payer: Self-pay

## 2024-05-23 ENCOUNTER — Inpatient Hospital Stay (HOSPITAL_COMMUNITY): Admitting: Registered Nurse

## 2024-05-23 ENCOUNTER — Emergency Department (HOSPITAL_COMMUNITY)

## 2024-05-23 ENCOUNTER — Encounter (HOSPITAL_COMMUNITY)
Admission: EM | Disposition: A | Discharge status: Skilled Nursing Facility | Payer: Self-pay | Source: Home / Self Care | Attending: Internal Medicine

## 2024-05-23 ENCOUNTER — Encounter (HOSPITAL_COMMUNITY): Payer: Self-pay

## 2024-05-23 ENCOUNTER — Inpatient Hospital Stay (HOSPITAL_COMMUNITY)
Admission: EM | Admit: 2024-05-23 | Discharge: 2024-05-26 | DRG: 481 | Disposition: A | Discharge status: Skilled Nursing Facility | Attending: Internal Medicine | Admitting: Internal Medicine

## 2024-05-23 ENCOUNTER — Inpatient Hospital Stay (HOSPITAL_COMMUNITY)

## 2024-05-23 DIAGNOSIS — Z87891 Personal history of nicotine dependence: Secondary | ICD-10-CM | POA: Diagnosis not present

## 2024-05-23 DIAGNOSIS — S72002A Fracture of unspecified part of neck of left femur, initial encounter for closed fracture: Secondary | ICD-10-CM | POA: Diagnosis not present

## 2024-05-23 DIAGNOSIS — Z86007 Personal history of in-situ neoplasm of skin: Secondary | ICD-10-CM | POA: Diagnosis not present

## 2024-05-23 DIAGNOSIS — Z8249 Family history of ischemic heart disease and other diseases of the circulatory system: Secondary | ICD-10-CM | POA: Diagnosis not present

## 2024-05-23 DIAGNOSIS — Z881 Allergy status to other antibiotic agents status: Secondary | ICD-10-CM | POA: Diagnosis not present

## 2024-05-23 DIAGNOSIS — S72012A Unspecified intracapsular fracture of left femur, initial encounter for closed fracture: Secondary | ICD-10-CM | POA: Diagnosis present

## 2024-05-23 DIAGNOSIS — I480 Paroxysmal atrial fibrillation: Secondary | ICD-10-CM | POA: Diagnosis present

## 2024-05-23 DIAGNOSIS — W010XXA Fall on same level from slipping, tripping and stumbling without subsequent striking against object, initial encounter: Secondary | ICD-10-CM | POA: Diagnosis present

## 2024-05-23 DIAGNOSIS — Z7901 Long term (current) use of anticoagulants: Secondary | ICD-10-CM | POA: Diagnosis not present

## 2024-05-23 DIAGNOSIS — W19XXXA Unspecified fall, initial encounter: Secondary | ICD-10-CM

## 2024-05-23 DIAGNOSIS — S42292A Other displaced fracture of upper end of left humerus, initial encounter for closed fracture: Secondary | ICD-10-CM

## 2024-05-23 DIAGNOSIS — Z808 Family history of malignant neoplasm of other organs or systems: Secondary | ICD-10-CM | POA: Diagnosis not present

## 2024-05-23 DIAGNOSIS — Z885 Allergy status to narcotic agent status: Secondary | ICD-10-CM | POA: Diagnosis not present

## 2024-05-23 DIAGNOSIS — I10 Essential (primary) hypertension: Secondary | ICD-10-CM | POA: Diagnosis present

## 2024-05-23 DIAGNOSIS — G20A1 Parkinson's disease without dyskinesia, without mention of fluctuations: Secondary | ICD-10-CM | POA: Diagnosis present

## 2024-05-23 DIAGNOSIS — S42202A Unspecified fracture of upper end of left humerus, initial encounter for closed fracture: Secondary | ICD-10-CM | POA: Diagnosis present

## 2024-05-23 DIAGNOSIS — K219 Gastro-esophageal reflux disease without esophagitis: Secondary | ICD-10-CM | POA: Diagnosis present

## 2024-05-23 DIAGNOSIS — Z888 Allergy status to other drugs, medicaments and biological substances status: Secondary | ICD-10-CM | POA: Diagnosis not present

## 2024-05-23 DIAGNOSIS — Z79899 Other long term (current) drug therapy: Secondary | ICD-10-CM | POA: Diagnosis not present

## 2024-05-23 DIAGNOSIS — G20C Parkinsonism, unspecified: Secondary | ICD-10-CM

## 2024-05-23 DIAGNOSIS — L89151 Pressure ulcer of sacral region, stage 1: Secondary | ICD-10-CM | POA: Diagnosis present

## 2024-05-23 LAB — CBC
HCT: 37.6 % — ABNORMAL LOW (ref 39.0–52.0)
HCT: 34.1 % — ABNORMAL LOW (ref 39.0–52.0)
Hemoglobin: 12.6 g/dL — ABNORMAL LOW (ref 13.0–17.0)
Hemoglobin: 11.4 g/dL — ABNORMAL LOW (ref 13.0–17.0)
MCH: 31.7 pg (ref 26.0–34.0)
MCH: 31.2 pg (ref 26.0–34.0)
MCHC: 33.5 g/dL (ref 30.0–36.0)
MCHC: 33.4 g/dL (ref 30.0–36.0)
MCV: 94.7 fL (ref 80.0–100.0)
MCV: 93.4 fL (ref 80.0–100.0)
Platelets: 223 K/uL (ref 150–400)
Platelets: 230 K/uL (ref 150–400)
RBC: 3.97 MIL/uL — ABNORMAL LOW (ref 4.22–5.81)
RBC: 3.65 MIL/uL — ABNORMAL LOW (ref 4.22–5.81)
RDW: 13 % (ref 11.5–15.5)
RDW: 13.1 % (ref 11.5–15.5)
WBC: 14.6 K/uL — ABNORMAL HIGH (ref 4.0–10.5)
WBC: 15.9 K/uL — ABNORMAL HIGH (ref 4.0–10.5)
nRBC: 0 % (ref 0.0–0.2)
nRBC: 0 % (ref 0.0–0.2)

## 2024-05-23 LAB — BASIC METABOLIC PANEL WITH GFR
Anion gap: 7 (ref 5–15)
BUN: 31 mg/dL — ABNORMAL HIGH (ref 8–23)
CO2: 26 mmol/L (ref 22–32)
Calcium: 9.1 mg/dL (ref 8.9–10.3)
Chloride: 105 mmol/L (ref 98–111)
Creatinine, Ser: 1.17 mg/dL (ref 0.61–1.24)
GFR, Estimated: >60 mL/min (ref >60)
Glucose, Bld: 147 mg/dL — ABNORMAL HIGH (ref 70–99)
Potassium: 4.4 mmol/L (ref 3.5–5.1)
Sodium: 138 mmol/L (ref 135–145)

## 2024-05-23 LAB — COMPREHENSIVE METABOLIC PANEL WITH GFR
ALT: 8 U/L (ref 0–44)
AST: 25 U/L (ref 15–41)
Albumin: 3.9 g/dL (ref 3.5–5.0)
Alkaline Phosphatase: 74 U/L (ref 38–126)
Anion gap: 11 (ref 5–15)
BUN: 29 mg/dL — ABNORMAL HIGH (ref 8–23)
CO2: 24 mmol/L (ref 22–32)
Calcium: 9 mg/dL (ref 8.9–10.3)
Chloride: 104 mmol/L (ref 98–111)
Creatinine, Ser: 1.16 mg/dL (ref 0.61–1.24)
GFR, Estimated: >60 mL/min (ref >60)
Glucose, Bld: 129 mg/dL — ABNORMAL HIGH (ref 70–99)
Potassium: 4.6 mmol/L (ref 3.5–5.1)
Sodium: 139 mmol/L (ref 135–145)
Total Bilirubin: 0.7 mg/dL (ref 0.0–1.2)
Total Protein: 6.7 g/dL (ref 6.5–8.1)

## 2024-05-23 LAB — I-STAT CHEM 8, ED
BUN: 35 mg/dL — ABNORMAL HIGH (ref 8–23)
Calcium, Ion: 1.13 mmol/L — ABNORMAL LOW (ref 1.15–1.40)
Chloride: 106 mmol/L (ref 98–111)
Creatinine, Ser: 1.3 mg/dL — ABNORMAL HIGH (ref 0.61–1.24)
Glucose, Bld: 130 mg/dL — ABNORMAL HIGH (ref 70–99)
HCT: 37 % — ABNORMAL LOW (ref 39.0–52.0)
Hemoglobin: 12.6 g/dL — ABNORMAL LOW (ref 13.0–17.0)
Potassium: 4.4 mmol/L (ref 3.5–5.1)
Sodium: 140 mmol/L (ref 135–145)
TCO2: 25 mmol/L (ref 22–32)

## 2024-05-23 LAB — TYPE AND SCREEN
ABO/RH(D): A NEG
Antibody Screen: NEGATIVE

## 2024-05-23 LAB — I-STAT CG4 LACTIC ACID, ED: Lactic Acid, Venous: 1.4 mmol/L (ref 0.5–1.9)

## 2024-05-23 LAB — PROTIME-INR
INR: 1.2 (ref 0.8–1.2)
Prothrombin Time: 15.7 s — ABNORMAL HIGH (ref 11.4–15.2)

## 2024-05-23 LAB — SAMPLE TO BLOOD BANK

## 2024-05-23 LAB — ETHANOL: Alcohol, Ethyl (B): <15 mg/dL (ref <15)

## 2024-05-23 SURGERY — FIXATION, FEMUR, NECK, PERCUTANEOUS, USING SCREW
Anesthesia: General | Site: Hip | Laterality: Left

## 2024-05-23 SURGERY — HEMIARTHROPLASTY (BIPOLAR) HIP, POSTERIOR APPROACH FOR FRACTURE
Anesthesia: General | Laterality: Left

## 2024-05-23 MED ORDER — METHOCARBAMOL 500 MG PO TABS
500.0000 mg | ORAL_TABLET | Freq: Four times a day (QID) | ORAL | Status: DC | PRN
  Administered 2024-05-23 – 2024-05-24 (×2): 500 mg via ORAL
  Filled 2024-05-23 (×2): qty 1

## 2024-05-23 MED ORDER — ONDANSETRON HCL 4 MG/2ML IJ SOLN
INTRAMUSCULAR | Status: DC | PRN
  Administered 2024-05-23: 4 mg via INTRAVENOUS

## 2024-05-23 MED ORDER — ALBUMIN HUMAN 5 % IV SOLN: INTRAVENOUS | Status: DC | PRN

## 2024-05-23 MED ORDER — FENTANYL CITRATE (PF) 100 MCG/2ML IJ SOLN
25.0000 ug | INTRAMUSCULAR | Status: DC | PRN
  Administered 2024-05-23 (×2): 25 ug via INTRAVENOUS

## 2024-05-23 MED ORDER — ROCURONIUM BROMIDE 10 MG/ML (PF) SYRINGE
PREFILLED_SYRINGE | INTRAVENOUS | Status: DC | PRN
  Administered 2024-05-23: 60 mg via INTRAVENOUS

## 2024-05-23 MED ORDER — 0.9 % SODIUM CHLORIDE (POUR BTL) OPTIME
TOPICAL | Status: DC | PRN
  Administered 2024-05-23: 1000 mL

## 2024-05-23 MED ORDER — LIDOCAINE 2% (20 MG/ML) 5 ML SYRINGE
INTRAMUSCULAR | Status: DC | PRN
  Administered 2024-05-23: 60 mg via INTRAVENOUS

## 2024-05-23 MED ORDER — SUGAMMADEX SODIUM 200 MG/2ML IV SOLN
INTRAVENOUS | Status: DC | PRN
  Administered 2024-05-23: 200 mg via INTRAVENOUS

## 2024-05-23 MED ORDER — CHLORHEXIDINE GLUCONATE 0.12 % MT SOLN
OROMUCOSAL | Status: AC
  Filled 2024-05-23: qty 15

## 2024-05-23 MED ORDER — TRANEXAMIC ACID-NACL 1000-0.7 MG/100ML-% IV SOLN
1000.0000 mg | Freq: Once | INTRAVENOUS | Status: AC
  Administered 2024-05-23: 1000 mg via INTRAVENOUS
  Filled 2024-05-23: qty 100

## 2024-05-23 MED ORDER — OXYCODONE HCL 5 MG PO TABS
2.5000 mg | ORAL_TABLET | ORAL | Status: DC | PRN
  Administered 2024-05-23 – 2024-05-26 (×4): 5 mg via ORAL
  Filled 2024-05-23 (×5): qty 1

## 2024-05-23 MED ORDER — HYDRALAZINE HCL 25 MG PO TABS: 25.0000 mg | ORAL_TABLET | Freq: Four times a day (QID) | ORAL | Status: DC | PRN

## 2024-05-23 MED ORDER — FENTANYL CITRATE (PF) 100 MCG/2ML IJ SOLN
INTRAMUSCULAR | Status: AC
  Filled 2024-05-23: qty 2

## 2024-05-23 MED ORDER — MIRABEGRON ER 50 MG PO TB24
50.0000 mg | ORAL_TABLET | Freq: Every day | ORAL | Status: DC
  Administered 2024-05-24 – 2024-05-25 (×2): 50 mg via ORAL
  Filled 2024-05-23 (×3): qty 1

## 2024-05-23 MED ORDER — POLYETHYLENE GLYCOL 3350 17 G PO PACK: 17.0000 g | PACK | Freq: Every day | ORAL | Status: DC | PRN

## 2024-05-23 MED ORDER — PROPOFOL 10 MG/ML IV BOLUS
INTRAVENOUS | Status: DC | PRN
  Administered 2024-05-23: 130 mg via INTRAVENOUS

## 2024-05-23 MED ORDER — PHENYLEPHRINE HCL-NACL 20-0.9 MG/250ML-% IV SOLN
INTRAVENOUS | Status: DC | PRN
  Administered 2024-05-23: 30 ug/min via INTRAVENOUS

## 2024-05-23 MED ORDER — FENTANYL CITRATE (PF) 250 MCG/5ML IJ SOLN
INTRAMUSCULAR | Status: DC | PRN
  Administered 2024-05-23: 25 ug via INTRAVENOUS

## 2024-05-23 MED ORDER — CARBIDOPA-LEVODOPA 25-100 MG PO TABS
1.0000 | ORAL_TABLET | Freq: Three times a day (TID) | ORAL | Status: DC
  Administered 2024-05-23 – 2024-05-25 (×8): 1 via ORAL
  Filled 2024-05-23 (×9): qty 1

## 2024-05-23 MED ORDER — VANCOMYCIN HCL 1000 MG IV SOLR
INTRAVENOUS | Status: DC | PRN
  Administered 2024-05-23: 1000 mg via TOPICAL

## 2024-05-23 MED ORDER — VANCOMYCIN HCL 1000 MG IV SOLR
INTRAVENOUS | Status: AC
  Filled 2024-05-23: qty 20

## 2024-05-23 MED ORDER — TAMSULOSIN HCL 0.4 MG PO CAPS
0.4000 mg | ORAL_CAPSULE | Freq: Every day | ORAL | Status: DC
  Administered 2024-05-23 – 2024-05-25 (×3): 0.4 mg via ORAL
  Filled 2024-05-23 (×3): qty 1

## 2024-05-23 MED ORDER — TRANEXAMIC ACID-NACL 1000-0.7 MG/100ML-% IV SOLN
1000.0000 mg | INTRAVENOUS | Status: AC
  Administered 2024-05-23: 1000 mg via INTRAVENOUS

## 2024-05-23 MED ORDER — ORAL CARE MOUTH RINSE: 15.0000 mL | Freq: Once | OROMUCOSAL | Status: AC

## 2024-05-23 MED ORDER — ACETAMINOPHEN 10 MG/ML IV SOLN: 1000.0000 mg | Freq: Once | INTRAVENOUS | Status: DC | PRN

## 2024-05-23 MED ORDER — BISACODYL 5 MG PO TBEC: 5.0000 mg | DELAYED_RELEASE_TABLET | Freq: Every day | ORAL | Status: DC | PRN

## 2024-05-23 MED ORDER — HYDROMORPHONE HCL 1 MG/ML IJ SOLN
0.5000 mg | Freq: Once | INTRAMUSCULAR | Status: AC
  Administered 2024-05-23: 0.5 mg via INTRAVENOUS
  Filled 2024-05-23: qty 1

## 2024-05-23 MED ORDER — ACETAMINOPHEN 10 MG/ML IV SOLN
INTRAVENOUS | Status: AC
  Filled 2024-05-23: qty 100

## 2024-05-23 MED ORDER — ACETAMINOPHEN 10 MG/ML IV SOLN
INTRAVENOUS | Status: DC | PRN
  Administered 2024-05-23: 1000 mg via INTRAVENOUS

## 2024-05-23 MED ORDER — CARBIDOPA-LEVODOPA 25-100 MG PO TABS: 1.0000 | ORAL_TABLET | ORAL | Status: DC

## 2024-05-23 MED ORDER — MORPHINE SULFATE (PF) 2 MG/ML IV SOLN: 2.0000 mg | INTRAVENOUS | Status: AC | PRN

## 2024-05-23 MED ORDER — FENTANYL CITRATE (PF) 50 MCG/ML IJ SOSY
PREFILLED_SYRINGE | INTRAMUSCULAR | Status: AC
  Filled 2024-05-23: qty 2

## 2024-05-23 MED ORDER — CEFAZOLIN SODIUM-DEXTROSE 2-4 GM/100ML-% IV SOLN
INTRAVENOUS | Status: AC
  Filled 2024-05-23: qty 100

## 2024-05-23 MED ORDER — TRANEXAMIC ACID-NACL 1000-0.7 MG/100ML-% IV SOLN
INTRAVENOUS | Status: AC
  Filled 2024-05-23: qty 100

## 2024-05-23 MED ORDER — FINASTERIDE 5 MG PO TABS
5.0000 mg | ORAL_TABLET | Freq: Every day | ORAL | Status: DC
  Administered 2024-05-23 – 2024-05-26 (×4): 5 mg via ORAL
  Filled 2024-05-23 (×4): qty 1

## 2024-05-23 MED ORDER — CEFAZOLIN SODIUM-DEXTROSE 2-4 GM/100ML-% IV SOLN
2.0000 g | Freq: Three times a day (TID) | INTRAVENOUS | Status: AC
  Administered 2024-05-23 – 2024-05-24 (×3): 2 g via INTRAVENOUS
  Filled 2024-05-23 (×3): qty 100

## 2024-05-23 MED ORDER — HYDRALAZINE HCL 20 MG/ML IJ SOLN: 10.0000 mg | Freq: Four times a day (QID) | INTRAMUSCULAR | Status: DC | PRN

## 2024-05-23 MED ORDER — LACTATED RINGERS IV SOLN: INTRAVENOUS | Status: DC

## 2024-05-23 MED ORDER — PROCHLORPERAZINE EDISYLATE 10 MG/2ML IJ SOLN: 5.0000 mg | Freq: Four times a day (QID) | INTRAMUSCULAR | Status: DC | PRN

## 2024-05-23 MED ORDER — ENSURE SURGERY PO LIQD
237.0000 mL | Freq: Two times a day (BID) | ORAL | Status: DC
  Administered 2024-05-24 – 2024-05-25 (×3): 237 mL via ORAL
  Filled 2024-05-23 (×6): qty 237

## 2024-05-23 MED ORDER — DILTIAZEM HCL ER COATED BEADS 180 MG PO CP24
360.0000 mg | ORAL_CAPSULE | Freq: Every day | ORAL | Status: DC
  Administered 2024-05-23 – 2024-05-26 (×4): 360 mg via ORAL
  Filled 2024-05-23 (×5): qty 2

## 2024-05-23 MED ORDER — CEFAZOLIN SODIUM-DEXTROSE 2-4 GM/100ML-% IV SOLN
2.0000 g | INTRAVENOUS | Status: AC
  Administered 2024-05-23: 2 g via INTRAVENOUS

## 2024-05-23 MED ORDER — CARBIDOPA-LEVODOPA 25-100 MG PO TABS
2.0000 | ORAL_TABLET | ORAL | Status: DC
  Administered 2024-05-23 – 2024-05-26 (×4): 2 via ORAL
  Filled 2024-05-23 (×4): qty 2

## 2024-05-23 MED ORDER — METHOCARBAMOL 1000 MG/10ML IJ SOLN: 500.0000 mg | Freq: Four times a day (QID) | INTRAMUSCULAR | Status: DC | PRN

## 2024-05-23 MED ORDER — PROPOFOL 10 MG/ML IV BOLUS
INTRAVENOUS | Status: AC
  Filled 2024-05-23: qty 20

## 2024-05-23 MED ORDER — FENTANYL CITRATE (PF) 50 MCG/ML IJ SOSY
100.0000 ug | PREFILLED_SYRINGE | Freq: Once | INTRAMUSCULAR | Status: AC
  Administered 2024-05-23: 100 ug via INTRAVENOUS

## 2024-05-23 MED ORDER — CHLORHEXIDINE GLUCONATE 0.12 % MT SOLN
15.0000 mL | Freq: Once | OROMUCOSAL | Status: AC
  Administered 2024-05-23: 15 mL via OROMUCOSAL

## 2024-05-23 MED ORDER — FENTANYL CITRATE (PF) 50 MCG/ML IJ SOSY
12.5000 ug | PREFILLED_SYRINGE | INTRAMUSCULAR | Status: DC | PRN
  Administered 2024-05-23: 25 ug via INTRAVENOUS
  Filled 2024-05-23: qty 1

## 2024-05-23 SURGICAL SUPPLY — 32 items
BAG COUNTER SPONGE SURGICOUNT (BAG) ×1 IMPLANT
BIT DRILL 3.2 QUICK MINI 300 (DRILL) IMPLANT
BIT DRILL 5.0 QC 6.5 (BIT) IMPLANT
BNDG COHESIVE 6X5 TAN ST LF (GAUZE/BANDAGES/DRESSINGS) ×1 IMPLANT
BRUSH SCRUB EZ PLAIN DRY (MISCELLANEOUS) ×1 IMPLANT
CHLORAPREP W/TINT 26 (MISCELLANEOUS) ×1 IMPLANT
COVER SURGICAL LIGHT HANDLE (MISCELLANEOUS) ×2 IMPLANT
DERMABOND ADVANCED .7 DNX12 (GAUZE/BANDAGES/DRESSINGS) ×1 IMPLANT
DRAPE C-ARMOR (DRAPES) ×1 IMPLANT
DRAPE STERI IOBAN 125X83 (DRAPES) ×1 IMPLANT
DRESSING MEPILEX FLEX 4X4 (GAUZE/BANDAGES/DRESSINGS) ×1 IMPLANT
DRSG TEGADERM 4X4.75 (GAUZE/BANDAGES/DRESSINGS) IMPLANT
GAUZE SPONGE 4X4 12PLY STRL (GAUZE/BANDAGES/DRESSINGS) IMPLANT
GAUZE XEROFORM 1X8 LF (GAUZE/BANDAGES/DRESSINGS) IMPLANT
GLOVE BIO SURGEON STRL SZ 6.5 (GLOVE) ×3 IMPLANT
GLOVE BIO SURGEON STRL SZ7.5 (GLOVE) ×4 IMPLANT
GLOVE BIOGEL PI IND STRL 7.5 (GLOVE) ×1 IMPLANT
GOWN STRL REUS W/ TWL LRG LVL3 (GOWN DISPOSABLE) ×2 IMPLANT
KIT BASIN OR (CUSTOM PROCEDURE TRAY) ×1 IMPLANT
KIT TURNOVER KIT B (KITS) ×1 IMPLANT
MANIFOLD NEPTUNE II (INSTRUMENTS) ×1 IMPLANT
PACK GENERAL/GYN (CUSTOM PROCEDURE TRAY) ×1 IMPLANT
PAD ARMBOARD POSITIONER FOAM (MISCELLANEOUS) ×2 IMPLANT
SCREW 6.5 x 85 PT 46MM (Screw) IMPLANT
SCREW CORT ANN 6.5X85 (Screw) IMPLANT
SCREW PT 6.5X90 (Screw) IMPLANT
SOLN 0.9% NACL POUR BTL 1000ML (IV SOLUTION) ×1 IMPLANT
SOLN STERILE WATER BTL 1000 ML (IV SOLUTION) ×1 IMPLANT
STAPLER SKIN PROX 35W (STAPLE) ×1 IMPLANT
TOWEL GREEN STERILE (TOWEL DISPOSABLE) ×2 IMPLANT
TOWEL GREEN STERILE FF (TOWEL DISPOSABLE) ×1 IMPLANT
WASHER CANN 12.7 NS (Orthopedic Implant) IMPLANT

## 2024-05-23 NOTE — Anesthesia Procedure Notes (Signed)
 Procedure Name: Intubation Date/Time: 05/23/2024 12:27 PM  Performed by: Virgil Ee, CRNAPre-anesthesia Checklist: Patient identified, Patient being monitored, Timeout performed, Emergency Drugs available and Suction available Patient Re-evaluated:Patient Re-evaluated prior to induction Oxygen Delivery Method: Circle system utilized Preoxygenation: Pre-oxygenation with 100% oxygen Induction Type: IV induction Ventilation: Mask ventilation without difficulty Laryngoscope Size: Mac and 4 Grade View: Grade I Tube type: Oral Tube size: 7.0 mm Number of attempts: 1 Airway Equipment and Method: Stylet Placement Confirmation: ETT inserted through vocal cords under direct vision, positive ETCO2 and breath sounds checked- equal and bilateral Secured at: 22 cm Tube secured with: Tape Dental Injury: Teeth and Oropharynx as per pre-operative assessment

## 2024-05-23 NOTE — ED Triage Notes (Incomplete)
 Pt was home and had an unwitnessed fall, he stumbled and fell, pt did hit his head, no obvious hematoma or trauma. Pt has left houlder pain and left hip pain. No deformity. Pt is on eliquis .   Medic vital

## 2024-05-23 NOTE — Progress Notes (Signed)
 Orthopedic Tech Progress Note Patient Details:  Michael Odonnell 1940/09/25 [id number] Called down to assist with arm sling due to present humerus fx. There is also a hip fx but did not have orders for the hip.  Ortho Devices Type of Ortho Device: Arm sling Ortho Device/Splint Location: L HUMERUS Ortho Device/Splint Interventions: Ordered, Application, Adjustment   Post Interventions Patient Tolerated: Fair Instructions Provided: Care of device  Michael Odonnell 05/23/2024, 6:05 AM

## 2024-05-23 NOTE — ED Notes (Signed)
 Patient states no difference in pain and is still unable to move left upper extremity. Noted grimace attempts to move left upper extremity.

## 2024-05-23 NOTE — Hospital Course (Signed)
°  Judson Tsan is a 83 y.o. male with past medical history significant for Parkinson disease, atrial fibrillation on Eliquis , hypertension, and arthritis presented to the hospital with left shoulder left hip and temple pain after falling at home.  Patient attributed that it was walking too fast with socks on a varnish floor.  He reported hitting his head and complained of pain in his left temple, left shoulder, and left hip.  In the ED, vitals were stable.  Labs were notable for mild leukocytosis at 14.6 with normal lactic.  X-ray showed left hip fracture and proximal left humerus fracture.  CT head/C-spine was negative for acute findings.  Orthopedic surgery (Dr. Georgina) was consulted by the ED PA and the patient was treated with Dilaudid  and fentanyl  and was admitted hospital for further evaluation and treatment..   Assessment and Plan.   Left hip hip fracture status post mechanical fall. Patient was on Eliquis  last dose 12/12.  Orthopedics was consulted and underwent percutaneous fixation of the femoral neck by orthopedics on 05/23/2024 intervention..  PT OT evaluation pending.  Aspirin  twice daily for DVT prophylaxis.  Patient is weightbearing as tolerated on the lower extremity but nonweightbearing on the left left upper extremity in a sling.   Left proximal humerus fracture  Continue immobilization in a sling..  Nonoperative treatment.  Follow orthopedic recommendations.    Atrial fibrillation  Continue Cardizem .  Eliquis  on hold currently.  Will resume when okay with orthopedics.   Parkinson disease  - Continue Sinemet      Essential HTN  ACE inhibitors on hold for now.  On as needed hydralazine .

## 2024-05-23 NOTE — Anesthesia Postprocedure Evaluation (Signed)
 Anesthesia Post Note  Patient: Michael Odonnell  Procedure(s) Performed: PERCUTANEOUS FIXATION OF FEMURAL NECK (Left: Hip)     Patient location during evaluation: PACU Anesthesia Type: General Level of consciousness: awake and alert Pain management: pain level controlled Vital Signs Assessment: post-procedure vital signs reviewed and stable Respiratory status: spontaneous breathing, nonlabored ventilation, respiratory function stable and patient connected to nasal cannula oxygen Cardiovascular status: blood pressure returned to baseline and stable Postop Assessment: no apparent nausea or vomiting Anesthetic complications: no   No notable events documented.  Last Vitals:  Vitals:   05/23/24 1600 05/23/24 1638  BP: 137/64 (!) 130/58  Pulse:  77  Resp: 15 16  Temp:  36.4 C  SpO2: 98% 99%    Last Pain:  Vitals:   05/23/24 1638  TempSrc: Oral  PainSc:                  Michael Odonnell

## 2024-05-23 NOTE — Op Note (Signed)
 Orthopedic Surgery Operative Report   Procedure: Left femoral neck fracture percutaneous pinning   Modifier: none   Date of procedure: 05/23/2024   Patient name: Michael Odonnell   MRN: [id number]  DOB: [birth year]   Surgeon: Ozell Ada, MD Assistant: none Pre-operative diagnosis: left valgus impacted femoral neck fracture Post-operative diagnosis: same as above Findings: left valgus impacted femoral neck fracture   Specimens: none Anesthesia: general EBL: 30cc Complications: none Pre-incision antibiotic: ancef  TXA was given prior to incision as well   Implants:  Implant Name Type Inv. Item Serial No. Manufacturer Lot No. LRB No. Used Action  WASHER CANN 12.7 NS - [id number] Orthopedic Implant WASHER CANN 12.7 NS  [employer]  Left 3 Implanted  SCREW PT 6.5X90 - [id number] Screw SCREW PT 6.5X90  [employer]  Left 1 Implanted and Explanted  SCREW 6.5 x 85 PT Screw   [employer]  Left 2 Implanted  SCREW PT 6.5X90 - [id number] Screw SCREW PT 6.5X90  [employer]  Left 1 Implanted       Indication for procedure: Patient is a 83 y.o. male who presented to the ER after a ground level fall. The patient had left hip pain and x-rays revealed a left femoral neck fracture.  CT was obtained which showed a valgus impacted fracture pattern.  The patient was admitted to a medicine service with orthopedics consulted. I met the patient and discussed the fracture. I recommended operative management in the form of percutaneous pinning.the risks, benefits, alternatives of surgery were covered with the patient and his wife.  All their questions were answered to their satisfaction.  After our discussion, patient elected to proceed with surgery.    Procedure Description: The patient was met in the pre-operative holding area. The patient's identity and consent were verified. The operative site was marked by myself. The patient's  remaining questions about the surgery were answered. The patient was brought back to the operating room. General anesthesia was induced and an endotracheal tube was placed by the anesthesia staff. The patient was transferred to the [facility] table. All bony prominences were well padded. Fluoroscopy confirmed no interval translation of the fracture since presentation to the ER. The surgical area was cleansed with alcohol.  Ancef  and TXA were administered by anesthesia. The patient's skin was then prepped and draped in a standard, sterile fashion. A time out was performed that identified the patient, the procedure, and the operative site. All team members agreed with what was stated in the time out.    Fluoroscopy was then brought in in the AP position.  A guidewire was placed over the skin and under fluoroscopic guidance, an estimate of the guidewire trajectory was made.  This trajectory was then drawn out on shower curtain drape overlying the skin with a marking pen.  The same was then done for the more inferior screw trajectory.  A sharp incision was then made over the lateral proximal femur between the 2 lines that had been drawn.  The incision was taken sharply down through the skin and dermis.  A guidewire was placed along the lateral aspect of the proximal femur.  Using fluoroscopic guidance, the wire was advanced into the femoral neck and head.  AP and lateral fluoroscopy confirmed satisfactory position of the wire.  The same process was then repeated to insert 2 more wires in the proximal aspect of the femoral neck.  Once the 3 wires had been placed, the screw  length was estimated off of the wires.  A drill was then used to drill over the first wire.  The estimated screw length with a washer was then placed over the wire and advanced into the femoral neck and head under fluoroscopic guidance.  The same process was repeated for the 2 remaining guidewires to insert a total of 3 cannulated partially-threaded 6.5  millimeter screws.  The inferior most screw was a 90 mm screw in length.  Initially, a 90 mm screw was placed over one of the more proximal guidewires but it appeared to be too long as I was advancing it so was removed.  An 85 mm was selected instead.  Another 85 mm length screw was placed over the other proximal guidewire.   Final AP and lateral fluoroscopic images were then taken of the hip that showed maintenance of the valgus and patent fracture alignment and satisfactory placement of the 3 partially-threaded cannulated screws. The wound was copiously irrigated with sterile saline.  Vancomycin  powder was placed in the wound.  The fascia was closed with 0 vicryl. The deep dermal layer was closed with 2-0 vicryl. The skin was closed with staples. Dressings were applied. All counts were correct at the end of the case. Patient was transferred back to a hospital bed. The patient was awakened from anesthesia and brought back to the post-anesthesia care unit in stable condition.     Post-operative plan: The patient will recover in the post-anesthesia care unit and then go to the floor on the medicine service. The patient will receive two post-operative doses of ancef .  He will get another dose of TXA.  He will be weight bearing as tolerated on the left lower extremity.  He will be nonweightbearing on the left upper extremity due to a proximal humerus fracture which is discussed in my consult note. The patient will work with physical therapy. The patient's disposition will be determined by the medicine service.        Ozell Ada, MD Orthopedic Surgeon

## 2024-05-23 NOTE — Anesthesia Preprocedure Evaluation (Signed)
 Anesthesia Evaluation  Patient identified by MRN, date of birth, ID band Patient awake    Reviewed: NPO status , Patient's Chart, lab work & pertinent test results  Airway Mallampati: II  TM Distance: >3 FB Neck ROM: Full    Dental no notable dental hx.    Pulmonary neg pulmonary ROS, former smoker   Pulmonary exam normal        Cardiovascular hypertension, Pt. on medications + dysrhythmias Atrial Fibrillation  Rhythm:Regular Rate:Normal     Neuro/Psych       Dementia Parkinsons Neuromuscular disease  negative psych ROS   GI/Hepatic Neg liver ROS,GERD  ,,  Endo/Other  negative endocrine ROS    Renal/GU negative Renal ROS  negative genitourinary   Musculoskeletal  (+) Arthritis , Osteoarthritis,    Abdominal Normal abdominal exam  (+)   Peds  Hematology Lab Results      Component                Value               Date                      WBC                      15.9 (H)            05/23/2024                HGB                      11.4 (L)            05/23/2024                HCT                      34.1 (L)            05/23/2024                MCV                      93.4                05/23/2024                PLT                      230                 05/23/2024             Lab Results      Component                Value               Date                      NA                       138                 05/23/2024                K  4.4                 05/23/2024                CO2                      26                  05/23/2024                GLUCOSE                  147 (H)             05/23/2024                BUN                      31 (H)              05/23/2024                CREATININE               1.17                05/23/2024                CALCIUM                   9.1                 05/23/2024                EGFR                     52.5                11/01/2023                 GFRNONAA                 >60                 05/23/2024              Anesthesia Other Findings   Reproductive/Obstetrics                              Anesthesia Physical Anesthesia Plan  ASA: 3  Anesthesia Plan: General   Post-op Pain Management:    Induction: Intravenous  PONV Risk Score and Plan: 2 and Ondansetron , Dexamethasone and Treatment may vary due to age or medical condition  Airway Management Planned: Mask and Oral ETT  Additional Equipment: None  Intra-op Plan:   Post-operative Plan: Extubation in OR  Informed Consent: I have reviewed the patients History and Physical, chart, labs and discussed the procedure including the risks, benefits and alternatives for the proposed anesthesia with the patient or authorized representative who has indicated his/her understanding and acceptance.     Dental advisory given and Consent reviewed with POA  Plan Discussed with: CRNA  Anesthesia Plan Comments:         Anesthesia Quick Evaluation

## 2024-05-23 NOTE — Transfer of Care (Addendum)
 Immediate Anesthesia Transfer of Care Note  Patient: Michael Odonnell  Procedure(s) Performed: PERCUTANEOUS FIXATION OF FEMURAL NECK (Left: Hip)  Patient Location: PACU  Anesthesia Type:General  Level of Consciousness: drowsy and responds to stimulation  Airway & Oxygen Therapy: Patient Spontanous Breathing and Patient connected to face mask oxygen  Post-op Assessment: Report given to RN and Post -op Vital signs reviewed and stable  Post vital signs: Reviewed and stable  Last Vitals:  Vitals Value Taken Time  BP 115/53 05/23/24 13:48  Temp    Pulse 69 05/23/24 13:53  Resp 11 05/23/24 13:53  SpO2 100 % 05/23/24 13:53  Vitals shown include unfiled device data.  Last Pain:  Vitals:   05/23/24 1145  TempSrc: Oral  PainSc: 0-No pain         Complications: No notable events documented.

## 2024-05-23 NOTE — Progress Notes (Signed)
°   05/23/24 0105  Spiritual Encounters  Type of Visit Initial  Care provided to: Family;Pt and family  Referral source Trauma page  Reason for visit Trauma  OnCall Visit Yes   Responded to level 2 trauma, FOT. Patient being taken for CT. Provided support and comfort to wife [name] until patient returned. Offered additional support to patient. Advised patient and spouse of chaplain's continued availability.

## 2024-05-23 NOTE — ED Notes (Signed)
 Patient does not think he can swallow pills at this time.

## 2024-05-23 NOTE — ED Provider Notes (Signed)
 [facility] Emergency Department Provider Note MRN:  [id number]  Arrival date & time: 05/23/2024     Chief Complaint   FOT   History of Present Illness   Michael Odonnell is a 83 y.o. year-old male presents to the ED with chief complaint of fall at home.  Uncertain what caused the fall.  States that he kind of slumped down to the ground.  Has history of Parkinson's.  He is anticoagulated on Eliquis  for A-fib.  He complains of left shoulder pain.  Denies any pain in the left hip.  He is not certain if he hit his head, but does complain of some pain on the left temple.  He denies any neck pain.  Denies any chest pain, shortness of breath, or abdominal pain.  Denies any back pain.  History provided by patient.   Review of Systems  Pertinent positive and negative review of systems noted in HPI.    Physical Exam   Vitals:   05/23/24 0130 05/23/24 0145  BP: (!) 162/54 (!) 147/67  Pulse: 74 75  Resp: 13 13  Temp:    SpO2: 94% 95%    CONSTITUTIONAL:  non toxic-appearing, NAD NEURO:  Alert and oriented x 3, CN 3-12 grossly intact EYES:  eyes equal and reactive ENT/NECK:  Supple, no stridor  CARDIO:  normal rate, regular rhythm, appears well-perfused  PULM:  No respiratory distress,  CTAB GI/GU:  non-distended,  MSK/SPINE:  Swelling and deformity to the left shoulder, no tenderness to bilateral hips, pelvis is stable, no CTLS spine tenderness, step off, or deformity SKIN:  no rash, atraumatic   *Additional and/or pertinent findings included in MDM below  Diagnostic and Interventional Summary    EKG Interpretation Date/Time:    Ventricular Rate:    PR Interval:    QRS Duration:    QT Interval:    QTC Calculation:   R Axis:      Text Interpretation:         Labs Reviewed  COMPREHENSIVE METABOLIC PANEL WITH GFR - Abnormal; Notable for the following components:      Result Value   Glucose, Bld 129 (*)    BUN 29 (*)    All other components within  normal limits  CBC - Abnormal; Notable for the following components:   WBC 14.6 (*)    RBC 3.97 (*)    Hemoglobin 12.6 (*)    HCT 37.6 (*)    All other components within normal limits  PROTIME-INR - Abnormal; Notable for the following components:   Prothrombin Time 15.7 (*)    All other components within normal limits  I-STAT CHEM 8, ED - Abnormal; Notable for the following components:   BUN 35 (*)    Creatinine, Ser 1.30 (*)    Glucose, Bld 130 (*)    Calcium , Ion 1.13 (*)    Hemoglobin 12.6 (*)    HCT 37.0 (*)    All other components within normal limits  ETHANOL  URINALYSIS, ROUTINE W REFLEX MICROSCOPIC  I-STAT CG4 LACTIC ACID, ED  SAMPLE TO BLOOD BANK    CT Shoulder Left Wo Contrast  Final Result    CT HEAD WO CONTRAST  Final Result    CT CERVICAL SPINE WO CONTRAST  Final Result    DG Chest Port 1 View  Final Result    DG Pelvis Portable  Final Result    DG Shoulder Left  Final Result      Medications  HYDROmorphone  (DILAUDID )  injection 0.5 mg (has no administration in time range)  fentaNYL  (SUBLIMAZE ) injection 100 mcg (100 mcg Intravenous Given 05/23/24 0103)     Procedures  /  Critical Care Procedures  ED Course and Medical Decision Making  I have reviewed the triage vital signs, the nursing notes, and pertinent available records from the EMR.  Social Determinants Affecting Complexity of Care: Patient has no clinically significant social determinants affecting this chief complaint..   ED Course: Clinical Course as of 05/23/24 0222  Sat May 23, 2024  0101 GLF On [facility]. Humerus fx.  [CC]    Clinical Course User Index [CC] Jerral Meth, MD    Medical Decision Making Patient here after a fall from home.  He has history of Parkinson's and lost balance falling to the ground.  He is uncertain if he hit his head.  He is anticoagulated on Eliquis .  He takes this for A-fib.  Patient brought in as a level 2 trauma per department protocol.  He has a  deformity over his left shoulder.  Plain films of the shoulder show a proximal humerus fracture.  The humeral head does appear to be located within the glenoid fossa.  He does not have any hip tenderness, but there is questionable fracture on pelvis imaging.    There is a subcapital femoral neck fracture noted by radiology.  I will consult with orthopedics.   Amount and/or Complexity of Data Reviewed Labs: ordered. Radiology: ordered.  Risk Prescription drug management. Decision regarding hospitalization.         Consultants: I consulted with Hospitalist, Dr. Charlton, who is appreciated for admitting. I consulted with Dr. Georgina, from orthopedics, who will see patient in the AM.  Treatment and Plan: Patient's exam and diagnostic results are concerning for fall with trauma (left subcapital femoral neck and left proximal humerus fx).  Feel that patient will need admission to the hospital for further treatment and evaluation.    Final Clinical Impressions(s) / ED Diagnoses     ICD-10-CM   1. Closed fracture of neck of left femur, initial encounter (HCC)  S72.002A     2. Other closed displaced fracture of proximal end of left humerus, initial encounter  S42.292A     3. Fall, initial encounter  W19.[facility]       ED Discharge Orders     None         Discharge Instructions Discussed with and Provided to Patient:   Discharge Instructions   None      Vicky Charleston, PA-C 05/23/24 [id number]    Jerral Meth, MD 05/23/24 [phone]

## 2024-05-23 NOTE — Consult Note (Signed)
 Orthopedic Surgery Consult Note  Assessment: Patient is a 83 y.o. male with left proximal humerus fracture, left femoral neck fracture   Plan: -Will plan for operative management of the femoral neck fracture today. Left proximal humerus fracture will plan to treat non-operatively -Diet: NPO for procedure -DVT ppx: aspirin  81mg  BID -Ancef  and TXA on call to OR -Weight bearing status: NWB LUE in sling, NWB LLE -PT evaluate and treat post-op -Pain control -Dispo: pending completion of operative plans   Discussed recommendation for operative intervention in the form of left hip fracture percutaneous pinning. Explained the risks of this procedure included, but were not limited to: malunion, nonunion, infection, bleeding, stiffness, screw cut out, neurovascular injury, need for additional procedures, deep vein thrombosis, pulmonary embolism, and death. The benefit of this procedure would be to stabilize the fracture. It would also allow for early mobilization after surgery. The alternatives of this surgery would be to treat the fracture with immobilization or to do no intervention. The patient's questions were answered to his satisfaction. After this discussion, patient elected to proceed with surgery. Informed consent was obtained.   ___________________________________________________________________________   Reason for consult: left proximal humerus fracture, left hip fracture  History:  Patient is a 83 y.o. male who fell yesterday evening on his way to the bathroom. He landed on his left side. He was unable to weight bear. He was brought to [facility] ER. He was found to have a left proximal humerus fracture and left hip fracture for which orthopedics was consulted. He was admitted to medicine. He is reporting left hip and left shoulder pain. No pain elsewhere.   Review of systems: General: denies fevers and chills, myalgias Neurologic: denies recent changes in vision, slurred  speech Abdomen: denies nausea, vomiting, hematemesis Respiratory: denies cough, shortness of breath  Past medical history:  Parkinsons Atrial fibrillation HTN GERD  Allergies: hydrochlorothiazide, codeine, doxycycline, metronidazole, primidone    Past surgical history:  Cleft palate repair  Social history: Denies use of nicotine-containing products (cigarettes, vaping, smokeless, etc.) Alcohol use: denies Denies use of recreational drugs  Family history: -reviewed and not pertinent to hip fracture and proximal humerus fracture   Physical Exam:  General: no acute distress, appears stated age Neurologic: alert, answering questions appropriately, following commands Cardiovascular: regular rate, no cyanosis Respiratory: unlabored breathing on room air, symmetric chest rise Psychiatric: appropriate affect, normal cadence to speech  MSK:   -Bilateral upper extremities  No tenderness to palpation over extremity, except over the left shoulder. No open wounds seen Fires deltoid, biceps, triceps, wrist extensors, wrist flexors, finger extensors, finger flexors  AIN/PIN/IO intact  Palpable radial pulse  Sensation intact to light touch in median/ulnar/radial/axillary nerve distributions  Hand warm and well perfused  -Right lower extremities  No tenderness to palpation over extremity, no gross deformity, no open wounds, no pain with log roll Fires hip flexors, quadriceps, hamstrings, tibialis anterior, gastrocnemius and soleus, extensor hallucis longus Plantarflexes and dorsiflexes toes Sensation intact to light touch in sural, saphenous, tibial, deep peroneal, and superficial peroneal nerve distributions Foot warm and well perfused  Left lower extremity  No tenderness to palpation over extremity, except around the hip. Pain with log roll in the groin. No gross deformity. No open wounds seen.  Does not fire hip flexors due to pain. Fires quadriceps, hamstrings, tibialis  anterior, gastrocnemius and soleus, extensor hallucis longus Plantarflexes and dorsiflexes toes Sensation intact to light touch in sural, saphenous, tibial, deep peroneal, and superficial peroneal nerve distributions Foot  warm and well perfused    Imaging: XRs of the left shoulder from 05/23/2024 was independently reviewed and interpreted, showing a shortened and displaced proximal humerus fracture. No dislocation seen.   XRs of the pelvis from 05/23/2024 was independently reviewed and interpreted, showing a femoral neck fracture. No dislocation seen. No other fracture seen.  CT showed a valgus impacted fracture pattern that is essentially nondisplaced   Patient name: Michael Odonnell Patient MRN: [id number] Date: 05/23/2024

## 2024-05-23 NOTE — ED Notes (Signed)
 Patient transported to CT

## 2024-05-23 NOTE — ED Notes (Signed)
 Per Linzy, RN in OR ancef  to be given one hour before surgery. She also states surgery will most likely not be until this afternoon.

## 2024-05-23 NOTE — Progress Notes (Addendum)
 Orthopedic Tech Progress Note Patient Details:  Michael Odonnell 1940-08-06 [id number] LV2T FOT. Patient was not present in the room to apply sling. Left it at bedside with his wife.  Joei Frangos L Karlis Cregg 05/23/2024, 1:22 AM

## 2024-05-23 NOTE — Brief Op Note (Signed)
 05/23/2024  1:43 PM  PATIENT:  Michael Odonnell  83 y.o. male  PRE-OPERATIVE DIAGNOSIS:  LEFT FEMORAL NECK FRACTURE  POST-OPERATIVE DIAGNOSIS:  LEFT FEMORAL NECK FRACTURE  PROCEDURE:  Procedures: PERCUTANEOUS FIXATION OF FEMURAL NECK (Left)  SURGEON:  Surgeons and Role:    DEWAINE Georgina Ozell DELENA, MD - Primary  PHYSICIAN ASSISTANT: none  ASSISTANTS: none   ANESTHESIA:   general  EBL:  30cc   BLOOD ADMINISTERED:none  DRAINS: none   LOCAL MEDICATIONS USED:  NONE  SPECIMEN:  No Specimen  DISPOSITION OF SPECIMEN:  N/A  COUNTS:  YES  TOURNIQUET:  NONE  DICTATION: .Note written in EPIC  PLAN OF CARE: Admit to inpatient   PATIENT DISPOSITION:  PACU - hemodynamically stable.   Delay start of Pharmacological VTE agent (>24hrs) due to surgical blood loss or risk of bleeding: no

## 2024-05-23 NOTE — H&P (Signed)
 History and Physical    Kairen Hallinan FMW:[id number] DOB: Feb 15, 1941 DOA: 05/23/2024  PCP: Nichole Senior, MD   Patient coming from: Home   Chief Complaint: Fall with left shoulder, left hip, and left temple pain   HPI: Aldair Rickel is a 83 y.o. male with medical history significant for Parkinson disease, atrial fibrillation on Eliquis , hypertension, and arthritis who presents with pain in his left shoulder, left hip, and left temple after a slip and fall at home.  Patient reports that he was in his usual state of health and had just gotten up from bed to use the bathroom when he slipped and fell.  He attributes the fall to walking too fast with socks on a varnish floor.  He reports hitting his head and has been experiencing pain in his left temple, left shoulder, and left hip.  He denies any recent illness, fever, chills, or chest pain. He denies any pain in his back or neck.  Patient states that he never experiences chest pain with activity and denies any history of myocardial infarction. His wife manages his medications and states that she last gave him Eliquis  at ~6 pm last night.    ED Course: Upon arrival to the ED, patient is found to be afebrile and saturating mid 90s on room air with normal RR, normal HR, and stable BP.  Labs are most notable for normal creatinine, WBC 14,600, and normal lactic acid.  Plain radiographs demonstrate left hip fracture and proximal left humerus fracture.  CT is negative for acute intracranial abnormality or C-spine fracture.  Orthopedic surgery (Dr. Georgina) was consulted by the ED PA and the patient was treated with Dilaudid  and fentanyl .  Review of Systems:  All other systems reviewed and apart from HPI, are negative.  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 His smoking use included cigarettes. He has never used smokeless tobacco. He reports that he does not drink alcohol and does not use drugs.  Allergies[1]  Family History  Problem Relation Age of Onset   Heart attack Mother 36   Heart disease Sister    Melanoma Sister    Heart attack Brother    Healthy Daughter    Healthy Daughter    Stomach cancer Neg Hx    Colon cancer Neg Hx      Prior to Admission medications  Medication Sig Start Date End Date Taking? Authorizing Provider  acetaminophen  (TYLENOL ) 500 MG tablet Take 1,000 mg by mouth every 6 (six) hours as needed for mild pain or headache.    Yes [provider]  apixaban  (ELIQUIS ) 5 MG TABS tablet TAKE 1 TABLET BY MOUTH TWICE  DAILY 02/13/24  Yes Crenshaw, Redell RAMAN, MD  Calcium  Carbonate Antacid (TUMS PO) Take 2 tablets by mouth daily as needed (heartburn).    Yes [provider]  carbidopa -levodopa  (SINEMET  IR) 25-100 MG tablet TAKE 2  TABLETS BY MOUTH AT 6 AM, 1 TABLET AT 10 AM, 1 TABLET AT 2 PM, AND 1 TABLET AT 6 PM 05/19/24  Yes Tat, Asberry RAMAN, DO  clobetasol cream (TEMOVATE) 0.05 % Apply topically. 05/12/24  Yes [provider]  diltiazem  (TIAZAC ) 360 MG 24 hr capsule TAKE 1 CAPSULE BY MOUTH DAILY 05/19/24  Yes Crenshaw, Redell RAMAN, MD  finasteride  (PROSCAR ) 5 MG tablet Take 5 mg by mouth daily in the afternoon. 07/06/23  Yes [provider]  Magnesium  400 MG TABS Take 400 mg by mouth daily in the afternoon.   Yes [provider]  mirabegron  ER (MYRBETRIQ ) 50 MG TB24 tablet Take 50 mg by mouth daily at 4 PM. 04/16/23  Yes [provider]  Multiple Vitamins-Minerals (CENTRUM SILVER 50+MEN) TABS Take 1 tablet by mouth daily  in the afternoon.   Yes [provider]  niacin  500 MG tablet Take 500 mg by mouth 2 (two) times daily with a meal.   Yes [provider]  perindopril (ACEON) 8 MG tablet Take 8 mg by mouth in the morning. 12/30/17  Yes [provider]  Polyethyl Glyc-Propyl Glyc PF (SYSTANE HYDRATION PF) 0.4-0.3 % SOLN Place 1 drop into both eyes daily as needed (dry eyes; eye irritations).   Yes [provider]  polyethylene glycol (MIRALAX  / GLYCOLAX ) 17 g packet Take 17 g by mouth daily as needed for moderate constipation.   Yes [provider]  tamsulosin  (FLOMAX ) 0.4 MG CAPS capsule Take 1 capsule (0.4 mg total) by mouth daily. Patient taking differently: Take 0.4 mg by mouth daily after supper. 12/22/16  Yes Nelta Jackye Saha, MD    Physical Exam: Vitals:   05/23/24 0145 05/23/24 0200 05/23/24 0230 05/23/24 0300  BP: (!) 147/67 (!) 156/65 (!) 157/94 127/88  Pulse: 75 75 81 85  Resp: 13 16 13 18   Temp:      TempSrc:      SpO2: 95% 94% 97% 94%  Weight:      Height:        Constitutional: NAD, calm  Eyes: PERTLA, lids and conjunctivae normal ENMT: Mucous membranes are moist. Posterior pharynx clear of any exudate or lesions.   Neck: supple, no masses  Respiratory: no wheezing, no crackles. No accessory muscle use.  Cardiovascular: S1 & S2 heard, rate ~80. No extremity edema.   Abdomen: No distension, no tenderness, soft. Bowel sounds active.  Musculoskeletal: no clubbing / cyanosis. Tender at left hip and left shoulder; neurovascularly intact.   Skin: no significant rashes, lesions, ulcers. Warm, dry, well-perfused. Neurologic: CN 2-12 grossly intact. Moving all extremities. Alert and oriented to person, place, and situation. Resting tremor involving LUE.  Psychiatric: Pleasant. Cooperative.    Labs and Imaging on Admission: I have personally reviewed following labs and imaging studies  CBC: Recent Labs  Lab 05/23/24 0102 05/23/24 0103  WBC  14.6*  --   HGB 12.6* 12.6*  HCT 37.6* 37.0*  MCV 94.7  --   PLT 223  --    Basic Metabolic Panel: Recent Labs  Lab 05/23/24 0102 05/23/24 0103  NA 139 140  K 4.6 4.4  CL 104 106  CO2 24  --   GLUCOSE 129* 130*  BUN 29* 35*  CREATININE 1.16 1.30*  CALCIUM  9.0  --    GFR: Estimated Creatinine Clearance: 40.2 mL/min (A) (by C-G formula based on SCr of 1.3 mg/dL (H)). Liver Function Tests: Recent Labs  Lab 05/23/24 0102  AST 25  ALT 8  ALKPHOS 74  BILITOT 0.7  PROT 6.7  ALBUMIN  3.9   No results for input(s): LIPASE, AMYLASE in the last 168 hours. No results for input(s): AMMONIA in the last 168 hours. Coagulation Profile: Recent Labs  Lab 05/23/24 0102  INR 1.2   Cardiac Enzymes: No results for input(s): CKTOTAL, CKMB, CKMBINDEX, TROPONINI in the last 168 hours. BNP (last 3 results) No results for input(s): PROBNP in the last 8760 hours. HbA1C: No results for input(s): HGBA1C in the last 72 hours. CBG: No results for input(s): GLUCAP in the last 168 hours. Lipid Profile: No results for input(s): CHOL, HDL, LDLCALC, TRIG, CHOLHDL, LDLDIRECT in the last 72 hours. Thyroid  Function Tests: No results for input(s): TSH, T4TOTAL, FREET4, T3FREE, THYROIDAB in the last 72 hours. Anemia Panel: No results for input(s): VITAMINB12, FOLATE, FERRITIN, TIBC, IRON, RETICCTPCT in the last 72 hours. Urine analysis:    Component Value Date/Time   COLORURINE YELLOW 05/06/2024 0109   APPEARANCEUR CLOUDY (A) 05/06/2024 0109   LABSPEC 1.015 05/06/2024 0109   PHURINE 6.0 05/06/2024 0109   GLUCOSEU NEGATIVE 05/06/2024 0109   HGBUR NEGATIVE 05/06/2024 0109   BILIRUBINUR NEGATIVE 05/06/2024 0109   KETONESUR NEGATIVE 05/06/2024 0109   PROTEINUR NEGATIVE 05/06/2024 0109   NITRITE NEGATIVE 05/06/2024 0109   LEUKOCYTESUR MODERATE (A) 05/06/2024 0109   Sepsis Labs: @LABRCNTIP (procalcitonin:4,lacticidven:4) )No results found  for this or any previous visit (from the past 240 hours).   Radiological Exams on Admission: CT Shoulder Left Wo Contrast Result Date: 05/23/2024 EXAM: CT LEFT SHOULDER, WITHOUT IV CONTRAST 05/23/2024 01:37:40 AM TECHNIQUE: Axial images were acquired through the left shoulder without IV contrast. Reformatted images were reviewed. Automated exposure control, iterative reconstruction, and/or weight based adjustment of the mA/kV was utilized to reduce the radiation dose to as low as reasonably achievable. COMPARISON: Plain film from earlier in the same day. CLINICAL HISTORY: Clinical history known left proximal humeral fracture FINDINGS: BONES: There is again noted a comminuted fracture of the proximal left humerus involving primarily the surgical neck. Impaction at the fracture site is noted. No other fractures are noted. JOINTS: The humeral head is well seated within the glenoid. No significant joint effusion is noted. SOFT TISSUES: No soft tissue hematoma is noted. No other focal abnormality is seen. IMPRESSION: 1. Comminuted fracture of the proximal left humerus involving primarily the surgical neck with impaction at the fracture site. 2. The humeral head remains well seated within the glenoid. 3. No additional fractures identified. Electronically signed by: Oneil Devonshire MD 05/23/2024 01:46 AM EST RP Workstation: [id number]   CT CERVICAL SPINE WO CONTRAST Result Date: 05/23/2024 EXAM: CT CERVICAL SPINE WITHOUT CONTRAST 05/23/2024 01:34:14 AM TECHNIQUE: CT of the cervical spine was performed without the administration of intravenous contrast. Multiplanar reformatted images are provided for review. Automated exposure control, iterative reconstruction, and/or weight based adjustment of the mA/kV was utilized to reduce the radiation dose to as low as reasonably achievable. COMPARISON: None available. CLINICAL HISTORY: Polytrauma, blunt. FINDINGS: BONES AND ALIGNMENT: No acute cervical spine fracture. There is a  minimally displaced fracture of the anterior superior corner of T2 favored to be acute. No traumatic malalignment. 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 SOFT TISSUES: No prevertebral soft tissue swelling. IMPRESSION: 1. Minimally displaced fracture of the anterior superior corner of T2, favored to be acute. 2. No acute cervical spine fracture. 3. 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 Electronically signed by: Franky Stanford MD  05/23/2024 01:45 AM EST RP Workstation: [id number]   CT HEAD WO CONTRAST Result Date: 05/23/2024 EXAM: CT HEAD WITHOUT 05/23/2024 01:34:14 AM TECHNIQUE: CT of the head was performed without the administration of intravenous contrast. Automated exposure control, iterative reconstruction, and/or weight based adjustment of the mA/kV was utilized to reduce the radiation dose to as low as reasonably achievable. COMPARISON: None available. CLINICAL HISTORY: Head trauma, moderate-severe FINDINGS: BRAIN AND VENTRICLES: No acute intracranial hemorrhage. No mass effect or midline shift. No extra-axial fluid collection. No evidence of acute infarct. No hydrocephalus. Mild diffuse cerebral volume loss. ORBITS: No acute abnormality. SINUSES AND MASTOIDS: No acute abnormality. SOFT TISSUES AND SKULL: No acute skull fracture. No acute soft tissue abnormality. IMPRESSION: 1. No acute intracranial abnormality. 2. Mild diffuse cerebral volume loss. Electronically signed by: Franky Stanford MD 05/23/2024 01:40 AM EST RP Workstation: [id number]   DG Shoulder Left Result Date: 05/23/2024 EXAM: 3 VIEW(S) XRAY OF THE LEFT SHOULDER 05/23/2024 01:23:49 AM COMPARISON: None available. CLINICAL HISTORY: Trauma FINDINGS: BONES AND JOINTS: Comminuted fracture of left humeral head and neck with approximately 1 shaft width anterior displacement of distal fracture fragment. The [facility] joint is unremarkable.  SOFT TISSUES: No abnormal calcifications. Visualized lung is unremarkable. IMPRESSION: 1. Comminuted left humeral head/neck fracture, as above. Electronically signed by: Pinkie Pebbles MD 05/23/2024 01:29 AM EST RP Workstation: [id number]   DG Pelvis Portable Result Date: 05/23/2024 EXAM: 1 VIEW(S) XRAY OF THE PELVIS 05/23/2024 01:23:49 AM COMPARISON: None available. CLINICAL HISTORY: Trauma FINDINGS: BONES AND JOINTS: Subcapital left femoral neck fracture. Mild bilateral hip osteoarthritis. SOFT TISSUES: The soft tissues are unremarkable. IMPRESSION: 1. Subcapital left femoral neck fracture. Electronically signed by: Pinkie Pebbles MD 05/23/2024 01:28 AM EST RP Workstation: [id number]   DG Chest Port 1 View Result Date: 05/23/2024 EXAM: 1 VIEW(S) XRAY OF THE CHEST 05/23/2024 01:23:49 AM COMPARISON: 10/30/2019 CLINICAL HISTORY: Trauma FINDINGS: LUNGS AND PLEURA: No focal pulmonary opacity. No pleural effusion. No pneumothorax. HEART AND MEDIASTINUM: No acute abnormality of the cardiac and mediastinal silhouettes. BONES AND SOFT TISSUES: No acute osseous abnormality. IMPRESSION: 1. No acute cardiopulmonary process. Electronically signed by: Pinkie Pebbles MD 05/23/2024 01:27 AM EST RP Workstation: [id number]    EKG: Independently reviewed. Artifact, undetermined rhythm, LAFB.   Assessment/Plan   1. Left hip fracture  - Based on the available data, Mr. Nawaz presents an estimated 0.9% risk of perioperative MI or cardiac arrest; he appears optimized for surgery and no further cardiac evaluation is indicated  - Hold Eliquis  (last dose was ~6 PM on 12/12), continue NPO, continue pain-control and supportive care    2. Left proximal humerus fracture  - Continue immobilization and supportive care, follow-up on orthopedic surgery recommendations    3. Atrial fibrillation  - Hold Eliquis , continue diltiazem    4. Parkinson disease  - Continue Sinemet     5. HTN  - Hold ACE-i and treat  as-needed only for now     DVT prophylaxis: SCDs  Code Status: Full  Level of Care: Level of care: Med-Surg Family Communication: Wife updated from ED   Disposition Plan:  Patient is from: Home  Anticipated d/c is to: TBD Anticipated d/c date is: 05/27/24  Patient currently: Pending orthopedic surgery consultation and likely operative hip repair, disposition planning  Consults called: Orthopedic surgery Admission status: Inpatient     Evalene GORMAN Sprinkles, MD [facility]  05/23/2024, 3:49 AM       [1]  Allergies Allergen Reactions   Hydrochlorothiazide Nausea Only and Palpitations  Codeine Nausea And Vomiting   Doxycycline Nausea Only   Metronidazole Nausea Only   Mysoline [Primidone] Other (See Comments)    Change in balance

## 2024-05-23 NOTE — Progress Notes (Signed)
 Same day note  Michael Odonnell is a 83 y.o. male with past medical history significant for Parkinson disease, atrial fibrillation on Eliquis , hypertension, and arthritis presented to hospital with left shoulder left hip and temple pain after falling at home.  Patient attributed that it was walking too fast with socks on a varnish floor.  He reported hitting his head and complained of pain in his left temple, left shoulder, and left hip.  In the ED, vitals were stable.  Labs were notable for mild leukocytosis at 14.6 with normal lactic.  X-ray showed left hip fracture and proximal left humerus fracture.  CT head/C-spine was negative for acute findings.  Orthopedic surgery (Dr. Georgina) was consulted by the ED PA and the patient was treated with Dilaudid  and fentanyl  and was admitted hospital for further evaluation and treatment..  Patient seen and examined at bedside.  Patient was admitted to the hospital for fall with left shoulder hip and temple pain.  At the time of my evaluation, patient complains of hip pain  Physical examination reveals elderly male, tenderness over the left hip and shoulder.  Laboratory data and imaging was reviewed  Assessment and Plan.  Left hip hip fracture status post mechanical fall. Patient was on Eliquis  last dose 12/12.  Orthopedics has been notified for surgical intervention..  Currently NPO.   Left proximal humerus fracture  Continue immobilization.  Follow orthopedic recommendations.    Atrial fibrillation  Continue Cardizem .  Eliquis  on hold   Parkinson disease  - Continue Sinemet      Essential HTN  ACE inhibitors on hold for now.  Blood pressure slightly elevated.  Will add as needed hydralazine .   No Charge  Signed,  Vernal Anselm Alstrom, MD [facility]

## 2024-05-24 DIAGNOSIS — L8990 Pressure ulcer of unspecified site, unspecified stage: Secondary | ICD-10-CM | POA: Diagnosis present

## 2024-05-24 LAB — BASIC METABOLIC PANEL WITH GFR
Anion gap: 12 (ref 5–15)
BUN: 31 mg/dL — ABNORMAL HIGH (ref 8–23)
CO2: 22 mmol/L (ref 22–32)
Calcium: 8.4 mg/dL — ABNORMAL LOW (ref 8.9–10.3)
Chloride: 105 mmol/L (ref 98–111)
Creatinine, Ser: 1.3 mg/dL — ABNORMAL HIGH (ref 0.61–1.24)
GFR, Estimated: 54 mL/min — ABNORMAL LOW (ref >60)
Glucose, Bld: 138 mg/dL — ABNORMAL HIGH (ref 70–99)
Potassium: 4.4 mmol/L (ref 3.5–5.1)
Sodium: 139 mmol/L (ref 135–145)

## 2024-05-24 LAB — CBC
HCT: 26.3 % — ABNORMAL LOW (ref 39.0–52.0)
Hemoglobin: 8.8 g/dL — ABNORMAL LOW (ref 13.0–17.0)
MCH: 31.2 pg (ref 26.0–34.0)
MCHC: 33.5 g/dL (ref 30.0–36.0)
MCV: 93.3 fL (ref 80.0–100.0)
Platelets: 174 K/uL (ref 150–400)
RBC: 2.82 MIL/uL — ABNORMAL LOW (ref 4.22–5.81)
RDW: 13.1 % (ref 11.5–15.5)
WBC: 10.7 K/uL — ABNORMAL HIGH (ref 4.0–10.5)
nRBC: 0 % (ref 0.0–0.2)

## 2024-05-24 MED ORDER — ACETAMINOPHEN 325 MG PO TABS
650.0000 mg | ORAL_TABLET | Freq: Four times a day (QID) | ORAL | Status: DC | PRN
  Administered 2024-05-24 – 2024-05-26 (×5): 650 mg via ORAL
  Filled 2024-05-24 (×5): qty 2

## 2024-05-24 MED ORDER — ADULT MULTIVITAMIN W/MINERALS CH
1.0000 | ORAL_TABLET | Freq: Every day | ORAL | Status: DC
  Administered 2024-05-24 – 2024-05-25 (×2): 1 via ORAL
  Filled 2024-05-24 (×2): qty 1

## 2024-05-24 MED ORDER — ASPIRIN 81 MG PO CHEW
81.0000 mg | CHEWABLE_TABLET | Freq: Two times a day (BID) | ORAL | Status: DC
  Administered 2024-05-24 (×2): 81 mg via ORAL
  Filled 2024-05-24 (×2): qty 1

## 2024-05-24 NOTE — Discharge Instructions (Addendum)
 Orthopedic Surgery Discharge Instructions  Patient name: Michael Odonnell Fracture: left femoral neck fracture, left proximal humerus fracture Procedure Performed: left femoral neck fracture percutaneous pinning Date of Surgery: 05/23/2024 Surgeon: Ozell Ada, MD  Activity: You are allowed to put as much weight on your leg as you would like. You can walk as much as you would like. You can perform household activities such as cleaning dishes, doing laundry, vacuuming, etc.You should not put any weight through or do any lifting with the left upper extremity. You should use a sling for the first two weeks. After two weeks, Dr. Ada will start letting you come out of the sling for parts of the day.   Incision Care: Your incision site has a dressing over it. That dressing should remain in place and dry at all times for a total of one week after surgery. After one week, you can remove the dressing. Underneath the dressing, you will find skin staples. You should leave these staples in place. They will be taken out in the office when the wound has healed. Do not pick, rub, or scrub at them. Do not put cream or lotion over the surgical area. After one week and once the dressing is off, it is okay to let soap and water run over your incision. Again, do not pick, scrub, or rub at the staples when bathing. Do not submerge (e.g., take a bath, swim, go in a hot tub, etc.) until six weeks after surgery. There may be some bloody drainage from the incision into the dressing after surgery. This is normal. You do not need to replace the dressing. Continue to leave it in place for the one week as instructed above. Should the dressing become saturated with blood or drainage, please call the office for further instructions.   Medications: You have been prescribed oxycodone . This is a narcotic pain medication and should only be taken as prescribed. You should not drink alcohol or operate heavy machinery (including driving)  while taking this medication. The oxycodone  can cause constipation as a side effect. For that reason, you have been prescribed senna and miralax . These are both laxatives. You do not need to take this medication if you develop diarrhea. Should you remain constipated even while taking the senna and miralax , please use the miralax  twice daily. Tylenol  has been prescribed to be taken every 8 hours, which will give you additional pain relief.   You have been prescribed aspirin  as a blood thinner. This medication is to be taken to prevent blood clots. Take 81 milligrams twice daily. You should refrain from using other blood thinners (warfarin, apixaban , plavix, xarelto, etc.) while using the aspirin . You will need to take this medication for a total of 6 weeks after your surgery.   You should not use over-the-counter NSAIDs (ibuprofen, Aleve, Celebrex, naproxen, meloxicam, etc.) for pain relief because aspirin  is a similar medication. There can be side effects including but not limited to kidney injury and ulcers if you take these type of medications with the aspirin .  In order to set expectations for opioid prescriptions, you will only be prescribed opioids for a total of six weeks after surgery and, at two-weeks after surgery, your opioid prescription will start to tapered (decreased dosage and number of pills). If you have ongoing need for opioid medication six weeks after surgery, you will be referred to pain management. If you are already established with a provider that is giving you opioid medications, you should schedule an appointment with  them for six weeks after surgery if you feel you are going to need another prescription. State law only allows for opioid prescriptions one week at a time. If you are running out of opioid medication near the end of the week, please call the office during business hours before running out so I can send you another prescription.   Driving: You should not drive while  taking narcotic pain medications. You should start getting back to driving slowly and you may want to try driving in a parking lot before doing anything more.   Diet: You are safe to resume your regular diet after surgery.   Reasons to Call the Office After Surgery: You should feel free to call the office with any concerns or questions you have in the post-operative period, but you should definitely notify the office if you develop: -shortness of breath, chest pain, or trouble breathing -excessive bleeding, drainage, redness, or swelling around the surgical site -fevers, chills, or pain that is getting worse with each passing day -persistent nausea or vomiting -new weakness in the left upper or lower extremity, new or worsening numbness or tingling in the left upper or lower extremity -other concerns about your surgery  Follow Up Appointments: You have an office follow up appointment with Dr. Georgina scheduled for 06/10/2024 at 2:45pm. The office location and phone number are listed below. Please arrive on time to your appointment.   Office Information:  -Ozell Georgina, MD -Phone number: [phone] -Address: [address]       [place], [place] [address]

## 2024-05-24 NOTE — Progress Notes (Signed)
 Orthopedic Surgery Progress Note   Assessment: Patient is a 83 y.o. male with left femoral neck fracture status post percutaneous pinning. Left proximal humerus fracture being treated non-operatively   Plan: -Operative plans: complete -Diet: regular -DVT ppx: restart home eliquis  -Antibiotics: ancef  x2 post-op doses -Weight bearing status: WBAT LLE, NWB LUE in sling -PT evaluate and treat -Pain control -Dispo: per primary  ___________________________________________________________________________  Subjective: No acute events overnight. Sleeping this morning but awakes to voice. States hip pain is better this morning than yesterday. Has not tried mobilizing yet.    Physical Exam:  General: no acute distress, appears stated age Neurologic: sleeping but awakes to voice, answering questions appropriately, following commands Respiratory: unlabored breathing on room air, symmetric chest rise Psychiatric: appropriate affect, normal cadence to speech  MSK:   -Left upper extremity  Sling in place Fires deltoid, biceps, triceps, wrist extensors, wrist flexors, finger extensors, finger flexors  AIN/PIN/IO intact  Palpable radial pulse  Sensation intact to light touch in median/ulnar/radial/axillary nerve distributions  Hand warm and well perfused  -Left lower extremity  Dressing over hip c/d/i EHL/TA/GSC intact Plantarflexes and dorsiflexes toes Sensation intact to light touch in sural, saphenous, tibial, deep peroneal, and superficial peroneal nerve distributions Foot warm and well perfused   Yesterday's total administered Morphine  Milligram Equivalents: 83   Patient name: Michael Odonnell Patient MRN: [id number] Date: 05/24/2024

## 2024-05-24 NOTE — Evaluation (Signed)
 Physical Therapy Evaluation Patient Details Name: Michael Odonnell MRN: [id number] DOB: 03/30/1941 Today's Date: 05/24/2024  History of Present Illness  83 y.o. male presents to [facility] 05/23/24 after a fall with L femoral neck fx s/p percutaneous pinning 12/13 and L proximal humerus fx treated non-op. PMHx:  Parkinson disease, atrial fibrillation on Eliquis , hypertension, and arthritis  Clinical Impression  PTA pt would mobilize with use of rollator with at least 2 falls in the past 6 months. Educated pt/wife on mobility precautions with TotalA needed to re-adjust sling. Pt presents below mobility baseline with pain in L UE>LE, impaired balance, and impaired gait pattern. Pt required MaxAx2 for bed mobility and CGA/MinA for seated balance with slight posterior lean. Pt was able to perform step-pivot to the right with MaxAx2 and 1HH on the R. Pt had a slight posterior lean with increased fear of falling. Discussed performing quad sets and ankle pumps throughout the day with pt/wife in agreement. Pt has 24/7 supervision available from wife upon d/c home. Recommending <3hrs post acute rehab to maximize rehab potential and decrease caregiver burden. Acute PT to follow.         If plan is discharge home, recommend the following: A lot of help with walking and/or transfers;A lot of help with bathing/dressing/bathroom;Assist for transportation;Help with stairs or ramp for entrance;Direct supervision/assist for medications management;Direct supervision/assist for financial management;Two people to help with bathing/dressing/bathroom   Can travel by private vehicle   No    Equipment Recommendations Other (comment) (TBD)     Functional Status Assessment Patient has had a recent decline in their functional status and demonstrates the ability to make significant improvements in function in a reasonable and predictable amount of time.     Precautions / Restrictions Precautions Precautions: Fall Recall of  Precautions/Restrictions: Impaired Precaution/Restrictions Comments: hx parkinson's Required Braces or Orthoses: Sling (L UE sling) Restrictions Weight Bearing Restrictions Per Provider Order: Yes LUE Weight Bearing Per Provider Order: Non weight bearing (in sling) LLE Weight Bearing Per Provider Order: Weight bearing as tolerated      Mobility  Bed Mobility Overal bed mobility: Needs Assistance Bed Mobility: Supine to Sit    Supine to sit: Max assist, +2 for physical assistance, +2 for safety/equipment, HOB elevated    General bed mobility comments: able to move R LE towards EOB with assist to guide LLE off and shift hips. Assist to also raise trunk with limited initiation from pt    Transfers Overall transfer level: Needs assistance Equipment used: 1 person hand held assist Transfers: Sit to/from Stand, Bed to chair/wheelchair/BSC Sit to Stand: Mod assist, +2 physical assistance, +2 safety/equipment   Step pivot transfers: Max assist, +2 physical assistance, +2 safety/equipment     General transfer comment: ModAx2 with 1HH w/ R hand, use of gait belt and bed pad to boost-up and steady. Able to step-pivot to the right with tactile cueing to take steps. Assist to slowly lower    Ambulation/Gait Ambulation/Gait assistance: Max assist, +2 physical assistance, +2 safety/equipment Gait Distance (Feet): 2 Feet Assistive device: 1 person hand held assist Gait Pattern/deviations: Step-to pattern, Decreased stance time - left, Decreased weight shift to left, Shuffle Gait velocity: decr     General Gait Details: Slight tremors in B LE's at rest and with movement. Less WB on LLE due to pain, however, able to take shuffling steps towards the right. Slight posterior lean throughout    Balance Overall balance assessment: Needs assistance, History of Falls Sitting-balance support: Single extremity supported,  Feet supported Sitting balance-Leahy Scale: Poor Sitting balance - Comments:  reliant on 1UE and CGA/MinA for posterior lean Postural control: Posterior lean Standing balance support: Single extremity supported, During functional activity, Reliant on assistive device for balance Standing balance-Leahy Scale: Poor Standing balance comment: reliant on 1UE support and external support with posterior lean       Pertinent Vitals/Pain Pain Assessment Pain Assessment: Faces Faces Pain Scale: Hurts even more Pain Location: L UE>L LE Pain Descriptors / Indicators: Aching, Discomfort, Grimacing Pain Intervention(s): Limited activity within patient's tolerance, Monitored during session, Repositioned, Premedicated before session    Home Living Family/patient expects to be discharged to:: Private residence Living Arrangements: Spouse/significant other Available Help at Discharge: Family;Available 24 hours/day Type of Home: House Home Access: Stairs to enter Entrance Stairs-Rails: Lawyer of Steps: 3   Home Layout: One level;Other (Comment) (attic) Home Equipment: Agricultural Consultant (2 wheels);Cane - quad;Rollator (4 wheels);Shower seat;BSC/3in1;Grab bars - tub/shower;Grab bars - toilet      Prior Function Prior Level of Function : Needs assist    Mobility Comments: Normally uses rollator, 2 prior falls before the admission fall ADLs Comments: assist for UB dressing     Extremity/Trunk Assessment   Upper Extremity Assessment Upper Extremity Assessment: Defer to OT evaluation    Lower Extremity Assessment Lower Extremity Assessment: LLE deficits/detail LLE Deficits / Details: Limited knee flexion/hip flexion ROM 2/2 pain. Deferred MMT due to pain, however, at least 3/5 with pt able to WB. Fair quad contraction LLE Sensation: WNL       Communication   Communication Communication: No apparent difficulties    Cognition Arousal: Alert Behavior During Therapy: WFL for tasks assessed/performed   PT - Cognitive impairments: Memory,  Attention, Initiation, Sequencing, Problem solving, Safety/Judgement    PT - Cognition Comments: Would look at wife to answer questions with wife providing accurate answers Following commands: Impaired Following commands impaired: Follows one step commands with increased time, Follows multi-step commands inconsistently     Cueing Cueing Techniques: Verbal cues, Tactile cues, Visual cues     General Comments General comments (skin integrity, edema, etc.): VSS on RA. Wife present and supportive    Exercises General Exercises - Lower Extremity Ankle Circles/Pumps: AROM, Both, 5 reps, Supine Quad Sets: AROM, Left, Supine (3 reps, 3 sec hold)   Assessment/Plan    PT Assessment Patient needs continued PT services  PT Problem List Decreased strength;Decreased range of motion;Decreased activity tolerance;Decreased balance;Decreased mobility;Decreased cognition;Decreased knowledge of use of DME;Decreased safety awareness;Decreased knowledge of precautions       PT Treatment Interventions DME instruction;Gait training;Stair training;Therapeutic activities;Functional mobility training;Therapeutic exercise;Balance training;Neuromuscular re-education;Patient/family education;Wheelchair mobility training    PT Goals (Current goals can be found in the Care Plan section)  Acute Rehab PT Goals Patient Stated Goal: to get better PT Goal Formulation: With patient/family Time For Goal Achievement: 06/07/24 Potential to Achieve Goals: Good    Frequency Min 2X/week     Co-evaluation   Reason for Co-Treatment: For patient/therapist safety;To address functional/ADL transfers;Necessary to address cognition/behavior during functional activity PT goals addressed during session: Balance;Mobility/safety with mobility         AM-PAC PT 6 Clicks Mobility  Outcome Measure Help needed turning from your back to your side while in a flat bed without using bedrails?: Total Help needed moving from lying  on your back to sitting on the side of a flat bed without using bedrails?: Total Help needed moving to and from a bed to a chair (including  a wheelchair)?: Total Help needed standing up from a chair using your arms (e.g., wheelchair or bedside chair)?: Total Help needed to walk in hospital room?: Total Help needed climbing 3-5 steps with a railing? : Total 6 Click Score: 6    End of Session Equipment Utilized During Treatment: Gait belt;Other (comment) (sling) Activity Tolerance: Patient limited by pain Patient left: in chair;with call bell/phone within reach;with chair alarm set;with family/visitor present Nurse Communication: Mobility status;Need for lift equipment;Precautions;Weight bearing status PT Visit Diagnosis: Unsteadiness on feet (R26.81);Other abnormalities of gait and mobility (R26.89);Muscle weakness (generalized) (M62.81);History of falling (Z91.81)    Time: 9080-9057 PT Time Calculation (min) (ACUTE ONLY): 23 min   Charges:   PT Evaluation $PT Eval Low Complexity: 1 Low   PT General Charges $$ ACUTE PT VISIT: 1 Visit       Kate ORN, PT, DPT Secure Chat Preferred  Rehab Office [phone]   Kate BRAVO Wendolyn 05/24/2024, 10:28 AM

## 2024-05-24 NOTE — Progress Notes (Signed)
 PROGRESS NOTE    Barclay Lennox  FMW:[id number] DOB: December 05, 1940 DOA: 05/23/2024 PCP: Nichole Senior, MD    Brief Narrative:   Roshard Rezabek is a 83 y.o. male with past medical history significant for Parkinson disease, atrial fibrillation on Eliquis , hypertension, and arthritis presented to the hospital with left shoulder left hip and temple pain after falling at home.  Patient attributed that it was walking too fast with socks on a varnish floor.  He reported hitting his head and complained of pain in his left temple, left shoulder, and left hip.  In the ED, vitals were stable.  Labs were notable for mild leukocytosis at 14.6 with normal lactic.  X-ray showed left hip fracture and proximal left humerus fracture.  CT head/C-spine was negative for acute findings.  Orthopedic surgery (Dr. Georgina) was consulted by the ED PA and the patient was treated with Dilaudid  and fentanyl  and was admitted hospital for further evaluation and treatment..     Assessment and Plan:  Principal Problem:   Closed fracture of proximal end of left humerus Active Problems:   PAF (paroxysmal atrial fibrillation) (HCC)   Essential hypertension   Parkinson's disease (HCC)   Closed fracture of neck of left femur (HCC)   Pressure injury of skin   Left hip hip fracture status post mechanical fall. Patient was on Eliquis  last dose 12/12.  Orthopedics was consulted and underwent percutaneous fixation of the femoral neck by orthopedics on 05/23/2024 intervention...  Aspirin  twice daily for DVT prophylaxis.  Patient is weightbearing as tolerated on the lower extremity but nonweightbearing on the left left upper extremity in a sling.  PT OT evaluation recommends skilled nursing facility   Left proximal humerus fracture  Continue immobilization in a sling..  Nonoperative treatment.  Follow orthopedic recommendations.    Atrial fibrillation  Continue Cardizem .  Eliquis  on hold currently.  Will resume when okay with  orthopedics.   Parkinson disease  - Continue Sinemet      Essential HTN  ACE inhibitors on hold for now.  On as needed hydralazine .   Pressure injury sacrum stage I.  Present on admission.  Continue pressure ulcer prevention protocol. Wound 05/23/24 1652 Pressure Injury Sacrum Upper Stage 1 -  Intact skin with non-blanchable redness of a localized area usually over a bony prominence. (Active)      DVT prophylaxis: SCDs Start: 05/23/24 0332, will need to restart Eliquis  if okay with orthopedics   Code Status:     Code Status: Full Code  Disposition: Skilled nursing facility as per PT evaluation  Status is: Inpatient Remains inpatient appropriate because: Need for rehabilitation   Family Communication: Spoke with the spouse at bedside on 05/24/2024  Consultants:  Orthopedics  Procedures:  Percutaneous fixation of the left femoral neck on 05/23/2024  Antimicrobials:  None  Anti-infectives (From admission, onward)    Start     Dose/Rate Route Frequency Ordered Stop   05/23/24 2030  ceFAZolin  (ANCEF ) IVPB 2g/100 mL premix        2 g 200 mL/hr over 30 Minutes Intravenous Every 8 hours 05/23/24 1640 05/24/24 1248   05/23/24 1318  vancomycin  (VANCOCIN ) powder  Status:  Discontinued          As needed 05/23/24 1319 05/23/24 1343   05/23/24 0630  ceFAZolin  (ANCEF ) IVPB 2g/100 mL premix        2 g 200 mL/hr over 30 Minutes Intravenous To Surgery 05/23/24 0615 05/23/24 1259        Subjective: Today, patient  was seen and examined at bedside.  Complains of pain in the right foot.  Denies any fever chills nausea vomiting shortness of breath.  Objective: Vitals:   05/23/24 1638 05/23/24 1943 05/24/24 0415 05/24/24 0722  BP: (!) 130/58 (!) 109/57 118/62 (!) 138/56  Pulse: 77 75 78 77  Resp: 16 15 18 17   Temp: 97.6 F (36.4 C) 98.2 F (36.8 C) 98.4 F (36.9 C) 98.9 F (37.2 C)  TempSrc: Oral Oral Oral Oral  SpO2: 99% 100% 93% 95%  Weight:      Height:         Intake/Output Summary (Last 24 hours) at 05/24/2024 1417 Last data filed at 05/24/2024 0900 Gross per 24 hour  Intake 120 ml  Output 100 ml  Net 20 ml   Filed Weights   05/23/24 0056  Weight: 72 kg    Physical Examination: Body mass index is 24.14 kg/m.   General:  Average built, not in obvious distress, alert awake Communicative, elderly male HENT:   No scleral pallor or icterus noted. Oral mucosa is moist.  Chest: Diminished breath sounds bilaterally.  No wheezes or crackles. CVS: S1 &S2 heard. No murmur.  Regular rate and rhythm. Abdomen: Soft, nontender, nondistended.  Bowel sounds are heard.   Extremities: No cyanosis, clubbing or edema.  Peripheral pulses are palpable.  Left upper extremity in a sling. Psych: Alert, awake and oriented, normal mood CNS:  No cranial nerve deficits.  Moves all extremities.  Tremors noted in the upper extremities.  Left hip with dressing. Skin: Warm and dry.    Data Reviewed:   CBC: Recent Labs  Lab 05/23/24 0102 05/23/24 0103 05/23/24 0352 05/24/24 0351  WBC 14.6*  --  15.9* 10.7*  HGB 12.6* 12.6* 11.4* 8.8*  HCT 37.6* 37.0* 34.1* 26.3*  MCV 94.7  --  93.4 93.3  PLT 223  --  230 174    Basic Metabolic Panel: Recent Labs  Lab 05/23/24 0102 05/23/24 0103 05/23/24 0352 05/24/24 0351  NA 139 140 138 139  K 4.6 4.4 4.4 4.4  CL 104 106 105 105  CO2 24  --  26 22  GLUCOSE 129* 130* 147* 138*  BUN 29* 35* 31* 31*  CREATININE 1.16 1.30* 1.17 1.30*  CALCIUM  9.0  --  9.1 8.4*    Liver Function Tests: Recent Labs  Lab 05/23/24 0102  AST 25  ALT 8  ALKPHOS 74  BILITOT 0.7  PROT 6.7  ALBUMIN  3.9     Radiology Studies: DG HIP UNILAT WITH PELVIS 2-3 VIEWS LEFT Result Date: 05/23/2024 CLINICAL DATA:  [id number] Surgery, elective [id number] EXAM: DG HIP (WITH OR WITHOUT PELVIS) 2-3V LEFT COMPARISON:  May 23, 2024 FINDINGS: Spot fluoroscopy images were obtained for surgical planning purposes. This demonstrates placement  of femoral neck screws through a femoral neck fracture. Fracture fragments are in similar alignment. Time: 92.2 seconds Dose: 24.43 mGy Please reference procedure report for further details. IMPRESSION: Spot fluoroscopy images obtained for surgical planning purposes. Electronically Signed   By: Corean Salter M.D.   On: 05/23/2024 18:14   DG C-Arm 1-60 Min-No Report Result Date: 05/23/2024 Fluoroscopy was utilized by the requesting physician.  No radiographic interpretation.   DG C-Arm 1-60 Min-No Report Result Date: 05/23/2024 Fluoroscopy was utilized by the requesting physician.  No radiographic interpretation.   DG C-Arm 1-60 Min-No Report Result Date: 05/23/2024 Fluoroscopy was utilized by the requesting physician.  No radiographic interpretation.   CT HIP LEFT WO CONTRAST Result Date:  05/23/2024 EXAM: CT OF THE LEFT HIP WITHOUT IV CONTRAST 05/23/2024 07:03:33 AM TECHNIQUE: CT of the left hip was performed without the administration of intravenous contrast. Multiplanar reformatted images are provided for review. Automated exposure control, iterative reconstruction, and/or weight based adjustment of the mA/kV was utilized to reduce the radiation dose to as low as reasonably achievable. COMPARISON: CTA abdomen and pelvis 05/06/2024. CLINICAL HISTORY: Hip surgical planning. FINDINGS: BONES: Osteopenia. There is an acute transverse oblique mid-cervical proximal left femoral fracture with impaction at the fracture site and about 1 cortex width of cephalad translation of the main distal fracture fragment. Spurring is seen at the left SI joint and symphysis. No destructive bone lesion is seen. There are 2 small sclerotic foci in the right pubic bone, 2 in the sacrum, probable bone islands but metastases are in the differential. No aggressive appearing osseous abnormality or periostitis. There is no further evidence of fractures. SOFT TISSUE: No significant soft tissue edema or fluid collections. No  soft tissue mass. No hematoma is seen around the fracture bed. JOINT: Partial joint space loss at the hip with acetabular osteophytes, consistent with osteoarthritis. No erosive arthropathy. No significant left hip joint effusion. INTRAPELVIC CONTENTS: Sigmoid diverticulosis and moderate fecal retention. 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 There are no overt findings of hernia or incarceration. No dilated bowel is seen in the visualized portion of the pelvis. There is iliofemoral calcific arteriosclerosis. No pelvic mass, hemorrhage, or free fluid. Multiple pelvic phleboliths. . Enlarged prostate again noted with dystrophic calcifications. The bladder is catheterized and contracted. Both testicles are in the scrotal sac. IMPRESSION: 1. Acute transverse oblique mid-cervical proximal left femoral fracture with impaction and approximately one cortex width cephalad translation of the distal fragment. No peri-fracture hematoma identified. 2. Partial hip joint space loss with acetabular osteophytes, consistent with osteoarthritis. No erosive arthropathy. 3. Bilateral inguinal hernias containing bowel, seen previously. 4. Prostatomegaly. Electronically signed by: Francis Quam MD 05/23/2024 07:39 AM EST RP Workstation: [id number]   CT Shoulder Left Wo Contrast Result Date: 05/23/2024 EXAM: CT LEFT SHOULDER, WITHOUT IV CONTRAST 05/23/2024 01:37:40 AM TECHNIQUE: Axial images were acquired through the left shoulder without IV contrast. Reformatted images were reviewed. Automated exposure control, iterative reconstruction, and/or weight based adjustment of the mA/kV was utilized to reduce the radiation dose to as low as reasonably achievable. COMPARISON: Plain film from earlier in the same day. CLINICAL HISTORY: Clinical history known left proximal humeral fracture FINDINGS:  BONES: There is again noted a comminuted fracture of the proximal left humerus involving primarily the surgical neck. Impaction at the fracture site is noted. No other fractures are noted. JOINTS: The humeral head is well seated within the glenoid. No significant joint effusion is noted. SOFT TISSUES: No soft tissue hematoma is noted. No other focal abnormality is seen. IMPRESSION: 1. Comminuted fracture of the proximal left humerus involving primarily the surgical neck with impaction at the fracture site. 2. The humeral head remains well seated within the glenoid. 3. No additional fractures identified. Electronically signed by: Oneil Devonshire MD 05/23/2024 01:46 AM EST RP Workstation: [id number]   CT CERVICAL SPINE WO CONTRAST Result Date: 05/23/2024 EXAM: CT CERVICAL SPINE WITHOUT CONTRAST 05/23/2024 01:34:14 AM TECHNIQUE: CT of the cervical spine was performed without the administration of intravenous contrast. Multiplanar reformatted images are provided for review. Automated exposure control, iterative reconstruction, and/or weight  based adjustment of the mA/kV was utilized to reduce the radiation dose to as low as reasonably achievable. COMPARISON: None available. CLINICAL HISTORY: Polytrauma, blunt. FINDINGS: BONES AND ALIGNMENT: No acute cervical spine fracture. There is a minimally displaced fracture of the anterior superior corner of T2 favored to be acute. No traumatic malalignment. 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 SOFT TISSUES: No prevertebral soft tissue swelling. IMPRESSION: 1. Minimally displaced fracture of the anterior superior corner of T2, favored to be acute. 2. No acute cervical spine fracture. 3. 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 Electronically signed by: Franky Stanford MD 05/23/2024 01:45 AM EST RP Workstation: [id number]   CT HEAD WO CONTRAST Result Date:  05/23/2024 EXAM: CT HEAD WITHOUT 05/23/2024 01:34:14 AM TECHNIQUE: CT of the head was performed without the administration of intravenous contrast. Automated exposure control, iterative reconstruction, and/or weight based adjustment of the mA/kV was utilized to reduce the radiation dose to as low as reasonably achievable. COMPARISON: None available. CLINICAL HISTORY: Head trauma, moderate-severe FINDINGS: BRAIN AND VENTRICLES: No acute intracranial hemorrhage. No mass effect or midline shift. No extra-axial fluid collection. No evidence of acute infarct. No hydrocephalus. Mild diffuse cerebral volume loss. ORBITS: No acute abnormality. SINUSES AND MASTOIDS: No acute abnormality. SOFT TISSUES AND SKULL: No acute skull fracture. No acute soft tissue abnormality. IMPRESSION: 1. No acute intracranial abnormality. 2. Mild diffuse cerebral volume loss. Electronically signed by: Franky Stanford MD 05/23/2024 01:40 AM EST RP Workstation: [id number]   DG Shoulder Left Result Date: 05/23/2024 EXAM: 3 VIEW(S) XRAY OF THE LEFT SHOULDER 05/23/2024 01:23:49 AM COMPARISON: None available. CLINICAL HISTORY: Trauma FINDINGS: BONES AND JOINTS: Comminuted fracture of left humeral head and neck with approximately 1 shaft width anterior displacement of distal fracture fragment. The [facility] joint is unremarkable. SOFT TISSUES: No abnormal calcifications. Visualized lung is unremarkable. IMPRESSION: 1. Comminuted left humeral head/neck fracture, as above. Electronically signed by: Pinkie Pebbles MD 05/23/2024 01:29 AM EST RP Workstation: [id number]   DG Pelvis Portable Result Date: 05/23/2024 EXAM: 1 VIEW(S) XRAY OF THE PELVIS 05/23/2024 01:23:49 AM COMPARISON: None available. CLINICAL HISTORY: Trauma FINDINGS: BONES AND JOINTS: Subcapital left femoral neck fracture. Mild bilateral hip osteoarthritis. SOFT TISSUES: The soft tissues are unremarkable. IMPRESSION: 1. Subcapital left femoral neck fracture. Electronically signed by: Pinkie Pebbles MD 05/23/2024 01:28 AM EST RP Workstation: [id number]   DG Chest Port 1 View Result Date: 05/23/2024 EXAM: 1 VIEW(S) XRAY OF THE CHEST 05/23/2024 01:23:49 AM COMPARISON: 10/30/2019 CLINICAL HISTORY: Trauma FINDINGS: LUNGS AND PLEURA: No focal pulmonary opacity. No pleural effusion. No pneumothorax. HEART AND MEDIASTINUM: No acute abnormality of the cardiac and mediastinal silhouettes. BONES AND SOFT TISSUES: No acute osseous abnormality. IMPRESSION: 1. No acute cardiopulmonary process. Electronically signed by: Pinkie Pebbles MD 05/23/2024 01:27 AM EST RP Workstation: [id number]      LOS: 1 day    Vernal Alstrom, MD [facility] Available via Epic secure chat 7am-7pm After these hours, please refer to coverage provider listed on [email or web address] 05/24/2024, 2:17 PM

## 2024-05-24 NOTE — TOC CAGE-AID Note (Signed)
 Transition of Care [facility]) - CAGE-AID Screening  Patient Details  Name: Michael Odonnell MRN: [id number] Date of Birth: May 26, 1941  Clinical Narrative:  Patient denies any current alcohol or drug use, no need for substance abuse resources at this time.  CAGE-AID Screening:   Have You Ever Felt You Ought to Cut Down on Your Drinking or Drug Use?: No Have People Annoyed You By Critizing Your Drinking Or Drug Use?: No Have You Felt Bad Or Guilty About Your Drinking Or Drug Use?: No Have You Ever Had a Drink or Used Drugs First Thing In The Morning to Steady Your Nerves or to Get Rid of a Hangover?: No CAGE-AID Score: 0  Substance Abuse Education Offered: No

## 2024-05-24 NOTE — Evaluation (Signed)
 Occupational Therapy Evaluation Patient Details Name: Michael Odonnell MRN: [id number] DOB: 08-16-1940 Today's Date: 05/24/2024   History of Present Illness   83 y.o. male presents to [facility] 05/23/24 after a fall with L femoral neck fx s/p percutaneous pinning 12/13 and L proximal humerus fx treated non-op. PMHx:  Parkinson disease, atrial fibrillation on Eliquis , hypertension, and arthritis     Clinical Impressions Pt admitted based on above, and was seen based on problem list below. PTA pt was living with his wife who assisted with some ADLs at baseline. Today pt is requiring min  to total +2 for ADLs. Bed mobility was max +2 and functional transfers are  max +2 with HH assist. Based on pt's performance today recommending <3 hours of skilled rehab daily. OT will continue to follow acutely to maximize functional independence.     If plan is discharge home, recommend the following:   Two people to help with walking and/or transfers;Two people to help with bathing/dressing/bathroom;Assistance with feeding;Assist for transportation     Functional Status Assessment   Patient has had a recent decline in their functional status and demonstrates the ability to make significant improvements in function in a reasonable and predictable amount of time.     Equipment Recommendations   Other (comment) (Defer to next venue)      Precautions/Restrictions   Precautions Precautions: Fall Recall of Precautions/Restrictions: Impaired Precaution/Restrictions Comments: hx parkinson's Required Braces or Orthoses: Sling (L UE sling) Restrictions Weight Bearing Restrictions Per Provider Order: Yes LUE Weight Bearing Per Provider Order: Non weight bearing (in sling) LLE Weight Bearing Per Provider Order: Weight bearing as tolerated     Mobility Bed Mobility Overal bed mobility: Needs Assistance Bed Mobility: Supine to Sit     Supine to sit: Max assist, +2 for physical assistance, +2 for  safety/equipment, HOB elevated     General bed mobility comments: Assist for LLE and trunk    Transfers Overall transfer level: Needs assistance Equipment used: 1 person hand held assist Transfers: Sit to/from Stand, Bed to chair/wheelchair/BSC Sit to Stand: Mod assist, +2 physical assistance, +2 safety/equipment     Step pivot transfers: Max assist, +2 physical assistance, +2 safety/equipment     General transfer comment: +2 assist to rise and step pivot      Balance Overall balance assessment: Needs assistance, History of Falls Sitting-balance support: Single extremity supported, Feet supported Sitting balance-Leahy Scale: Poor Sitting balance - Comments: reliant on 1UE and CGA/MinA for posterior lean Postural control: Posterior lean Standing balance support: Single extremity supported, During functional activity, Reliant on assistive device for balance Standing balance-Leahy Scale: Poor Standing balance comment: Reliant on external support         ADL either performed or assessed with clinical judgement   ADL Overall ADL's : Needs assistance/impaired Eating/Feeding: Sitting;Minimal assistance   Grooming: Minimal assistance;Sitting   Upper Body Bathing: Sitting;Moderate assistance   Lower Body Bathing: +2 for safety/equipment;+2 for physical assistance;Sit to/from stand;Maximal assistance   Upper Body Dressing : Cueing for UE precautions;Moderate assistance   Lower Body Dressing: Total assistance;+2 for physical assistance;+2 for safety/equipment;Sit to/from stand   Toilet Transfer: Maximal assistance;Rolling walker (2 wheels);+2 for physical assistance;+2 for safety/equipment           Functional mobility during ADLs: Maximal assistance;+2 for physical assistance;+2 for safety/equipment;Rolling walker (2 wheels) General ADL Comments: Limited d/t dominant impaired LUE and decreased balance     Vision Baseline Vision/History: 1 Wears glasses Patient Visual  Report: No change from  baseline Vision Assessment?: No apparent visual deficits            Pertinent Vitals/Pain Pain Assessment Pain Assessment: Faces Faces Pain Scale: Hurts even more Pain Location: L UE>L LE Pain Descriptors / Indicators: Aching, Discomfort, Grimacing Pain Intervention(s): Limited activity within patient's tolerance     Extremity/Trunk Assessment Upper Extremity Assessment Upper Extremity Assessment: LUE deficits/detail;Left hand dominant LUE Deficits / Details: L proximal humerus fx. No edema in digits, good AROM LUE: Unable to fully assess due to immobilization LUE Sensation: WNL LUE Coordination: decreased gross motor   Lower Extremity Assessment Lower Extremity Assessment: Defer to PT evaluation   Cervical / Trunk Assessment Cervical / Trunk Assessment: Kyphotic   Communication Communication Communication: No apparent difficulties   Cognition Arousal: Alert Behavior During Therapy: WFL for tasks assessed/performed Cognition: Cognition impaired   Orientation impairments: Situation Awareness: Online awareness impaired Memory impairment (select all impairments): Short-term memory, Working memory Attention impairment (select first level of impairment): Sustained attention Executive functioning impairment (select all impairments): Organization, Problem solving, Reasoning OT - Cognition Comments: Pt aware of fall and surgery, unaware of which extremity had surgery, despite reminded in session         Following commands: Impaired Following commands impaired: Follows one step commands with increased time, Follows multi-step commands inconsistently     Cueing  General Comments   Cueing Techniques: Verbal cues;Tactile cues;Visual cues  Wife present and supportive           Home Living Family/patient expects to be discharged to:: Private residence Living Arrangements: Spouse/significant other Available Help at Discharge: Family;Available 24  hours/day Type of Home: House Home Access: Stairs to enter [employer] of Steps: 3 Entrance Stairs-Rails: Left;Right Home Layout: One level;Other (Comment) (attic)     Bathroom Shower/Tub: Producer, Television/film/video: Standard     Home Equipment: Agricultural Consultant (2 wheels);Cane - quad;Rollator (4 wheels);Shower seat;BSC/3in1;Grab bars - tub/shower;Grab bars - toilet          Prior Functioning/Environment Prior Level of Function : Needs assist             Mobility Comments: Normally uses rollator, 2 prior falls before the admission fall ADLs Comments: assist for UB dressing    OT Problem List: Decreased strength;Decreased range of motion;Decreased activity tolerance;Impaired balance (sitting and/or standing);Decreased cognition;Decreased safety awareness;Decreased knowledge of use of DME or AE;Decreased knowledge of precautions;Impaired UE functional use   OT Treatment/Interventions: Self-care/ADL training;Therapeutic exercise;Energy conservation;DME and/or AE instruction;Therapeutic activities;Patient/family education;Balance training      OT Goals(Current goals can be found in the care plan section)   Acute Rehab OT Goals Patient Stated Goal: To get better OT Goal Formulation: With patient Time For Goal Achievement: 06/07/24 Potential to Achieve Goals: Good   OT Frequency:  Min 2X/week    Co-evaluation PT/OT/SLP Co-Evaluation/Treatment: Yes Reason for Co-Treatment: For patient/therapist safety;To address functional/ADL transfers;Necessary to address cognition/behavior during functional activity   OT goals addressed during session: ADL's and self-care      AM-PAC OT 6 Clicks Daily Activity     Outcome Measure Help from another person eating meals?: A Little Help from another person taking care of personal grooming?: A Little Help from another person toileting, which includes using toliet, bedpan, or urinal?: Total Help from another person  bathing (including washing, rinsing, drying)?: A Lot Help from another person to put on and taking off regular upper body clothing?: A Lot Help from another person to put on and taking off regular lower  body clothing?: A Lot 6 Click Score: 13   End of Session Equipment Utilized During Treatment: Gait belt;Other (comment) (sling) Nurse Communication: Mobility status  Activity Tolerance: Patient tolerated treatment well Patient left: in chair;with call bell/phone within reach;with chair alarm set;with family/visitor present  OT Visit Diagnosis: Unsteadiness on feet (R26.81);Other abnormalities of gait and mobility (R26.89);Muscle weakness (generalized) (M62.81)                Time: 9080-9057 OT Time Calculation (min): 23 min Charges:  OT General Charges $OT Visit: 1 Visit OT Evaluation $OT Eval Moderate Complexity: 1 Mod  Adrianne BROCKS, OT  Acute Rehabilitation Services Office [phone] Secure chat preferred   Adrianne GORMAN Savers 05/24/2024, 2:32 PM

## 2024-05-24 NOTE — Progress Notes (Signed)
 Initial Nutrition Assessment  INTERVENTION:   -Continue Ensure Surgery PO BID, each provides 330 kcals and 18g protein   -Multivitamin with minerals daily  NUTRITION DIAGNOSIS:   Increased nutrient needs related to post-op healing, hip fracture as evidenced by estimated needs.  GOAL:   Patient will meet greater than or equal to 90% of their needs  MONITOR:   PO intake, Supplement acceptance  REASON FOR ASSESSMENT:   Consult Hip fracture protocol  ASSESSMENT:   83 y.o. male with left femoral neck fracture status post percutaneous pinning. Left proximal humerus fracture being treated non-operatively  12/13 admitted, s/p PERCUTANEOUS FIXATION OF FEMURAL NECK (Left)   Patient now on regular diet. Ensure Surgery has been ordered for additional protein.  Pt currently consuming 75% of meals.   Per weight records, last weight measured was on 11/25, 158 lbs Pt's weight has decreased since March 2025, insignificant change for time frame.  Medications: Sinemet   Labs reviewed.  NUTRITION - FOCUSED PHYSICAL EXAM:  Unable to complete, working remotely.  Diet Order:   Diet Order             Diet regular Room service appropriate? Yes; Fluid consistency: Thin  Diet effective now                   EDUCATION NEEDS:   No education needs have been identified at this time  Skin:  Skin Assessment: Skin Integrity Issues: Skin Integrity Issues:: Stage I, Incisions Stage I: sacrum Incisions: left hip  Last BM:  12/12  Height:   Ht Readings from Last 1 Encounters:  05/23/24 5' 8 (1.727 m)    Weight:   Wt Readings from Last 1 Encounters:  05/23/24 72 kg    BMI:  Body mass index is 24.14 kg/m.  Estimated Nutritional Needs:   Kcal:  1800-2000  Protein:  85-95g  Fluid:  2L/day  Morna Lee, MS, RD, LDN Inpatient Clinical Dietitian Contact via Secure chat

## 2024-05-24 NOTE — Plan of Care (Signed)
  Problem: Education: Goal: Knowledge of General Education information will improve Description: Including pain rating scale, medication(s)/side effects and non-pharmacologic comfort measures Outcome: Progressing   Problem: Health Behavior/Discharge Planning: Goal: Ability to manage health-related needs will improve Outcome: Progressing   Problem: Coping: Goal: Level of anxiety will decrease Outcome: Progressing   Problem: Elimination: Goal: Will not experience complications related to bowel motility Outcome: Progressing   Problem: Skin Integrity: Goal: Risk for impaired skin integrity will decrease Outcome: Progressing

## 2024-05-25 ENCOUNTER — Encounter (HOSPITAL_COMMUNITY): Payer: Self-pay | Admitting: Orthopedic Surgery

## 2024-05-25 MED ORDER — APIXABAN 5 MG PO TABS
5.0000 mg | ORAL_TABLET | Freq: Two times a day (BID) | ORAL | Status: DC
  Administered 2024-05-25 – 2024-05-26 (×3): 5 mg via ORAL
  Filled 2024-05-25 (×3): qty 1

## 2024-05-25 NOTE — NC FL2 (Signed)
 [place]  MEDICAID FL2 LEVEL OF CARE FORM     IDENTIFICATION  Patient Name: Michael Odonnell Birthdate: 17-Dec-1940 Sex: male Admission Date (Current Location): 05/23/2024  [facility] and [place] Number:  Producer, Television/film/video and Address:  [facility] [place], [address] [place], [place] [address]      Provider Number: [id number]  Attending Physician Name and Address:  Sonjia Held, MD  Relative Name and Phone Number:  loyal, holzheimer   [phone]    Current Level of Care: Hospital Recommended Level of Care: Skilled Nursing Facility Prior Approval Number:    Date Approved/Denied:   PASRR Number:    Discharge Plan: SNF    Current Diagnoses: Patient Active Problem List   Diagnosis Date Noted   Pressure injury of skin 05/24/2024   Closed fracture of neck of left femur (HCC) 05/23/2024   Closed fracture of proximal end of left humerus 05/23/2024   Chronic anticoagulation 11/10/2019   PAF (paroxysmal atrial fibrillation) (HCC) 10/19/2019   Essential hypertension 10/19/2019   Parkinson's disease (HCC) 10/19/2019   BPH (benign prostatic hyperplasia) 10/19/2019   Nausea 11/07/2017    Orientation RESPIRATION BLADDER Height & Weight     Self, Situation, Place  O2   Weight: 158 lb 11.7 oz (72 kg) Height:  5' 8 (172.7 cm)  BEHAVIORAL SYMPTOMS/MOOD NEUROLOGICAL BOWEL NUTRITION STATUS        Diet (see discharge summary)  AMBULATORY STATUS COMMUNICATION OF NEEDS Skin   Extensive Assist Verbally Surgical wounds, Other (Comment) (stage 1 pressure injury on sacrum)                       Personal Care Assistance Level of Assistance  Bathing, Feeding, Dressing, Total care Bathing Assistance: Maximum assistance Feeding assistance: Limited assistance Dressing Assistance: Maximum assistance Total Care Assistance: Maximum assistance   Functional Limitations Info  Sight, Hearing, Speech Sight Info: Adequate Hearing Info: Adequate Speech Info:  Adequate    SPECIAL CARE FACTORS FREQUENCY  PT (By licensed PT), OT (By licensed OT)     PT Frequency: 5x week OT Frequency: 5x week            Contractures Contractures Info: Not present    Additional Factors Info  Code Status, Allergies Code Status Info: full Allergies Info: Hydrochlorothiazide, Codeine, Doxycycline, Metronidazole, Mysoline (Primidone)           Current Medications (05/25/2024):  This is the current hospital active medication list Current Facility-Administered Medications  Medication Dose Route Frequency Provider Last Rate Last Admin   acetaminophen  (TYLENOL ) tablet 650 mg  650 mg Oral Q6H PRN Pokhrel, Laxman, MD   650 mg at 05/25/24 9070   apixaban  (ELIQUIS ) tablet 5 mg  5 mg Oral BID Pokhrel, Laxman, MD   5 mg at 05/25/24 9070   bisacodyl  (DULCOLAX) EC tablet 5 mg  5 mg Oral Daily PRN Georgina Ozell LABOR, MD       carbidopa -levodopa  (SINEMET  IR) 25-100 MG per tablet immediate release 2 tablet  2 tablet Oral Q24H Georgina Ozell LABOR, MD   2 tablet at 05/25/24 9482   And   carbidopa -levodopa  (SINEMET  IR) 25-100 MG per tablet immediate release 1 tablet  1 tablet Oral TID Georgina Ozell LABOR, MD   1 tablet at 05/25/24 9070   diltiazem  (CARDIZEM  CD) 24 hr capsule 360 mg  360 mg Oral Daily Moore, Michael A, MD   360 mg at 05/25/24 9071   feeding supplement (ENSURE SURGERY) liquid 237  mL  237 mL Oral BID BM Moore, Michael A, MD   237 mL at 05/25/24 0932   finasteride  (PROSCAR ) tablet 5 mg  5 mg Oral Daily Moore, Michael A, MD   5 mg at 05/25/24 9070   hydrALAZINE  (APRESOLINE ) injection 10 mg  10 mg Intravenous Q6H PRN Georgina Ozell LABOR, MD       methocarbamol  (ROBAXIN ) tablet 500 mg  500 mg Oral Q6H PRN Moore, Michael A, MD   500 mg at 05/24/24 9177   Or   methocarbamol  (ROBAXIN ) injection 500 mg  500 mg Intravenous Q6H PRN Georgina Ozell LABOR, MD       mirabegron  ER (MYRBETRIQ ) tablet 50 mg  50 mg Oral q1600 Moore, Michael A, MD   50 mg at 05/24/24 1653   multivitamin with  minerals tablet 1 tablet  1 tablet Oral Q supper Pokhrel, Laxman, MD   1 tablet at 05/24/24 1653   oxyCODONE  (Oxy IR/ROXICODONE ) immediate release tablet 2.5-5 mg  2.5-5 mg Oral Q4H PRN Moore, Michael A, MD   5 mg at 05/24/24 1825   polyethylene glycol (MIRALAX  / GLYCOLAX ) packet 17 g  17 g Oral Daily PRN Moore, Michael A, MD       prochlorperazine  (COMPAZINE ) injection 5 mg  5 mg Intravenous Q6H PRN Moore, Michael A, MD       tamsulosin  (FLOMAX ) capsule 0.4 mg  0.4 mg Oral QPC supper Moore, Michael A, MD   0.4 mg at 05/24/24 1823     Discharge Medications: Please see discharge summary for a list of discharge medications.  Relevant Imaging Results:  Relevant Lab Results:   Additional Information SSN: [id number]  Bridget Cordella Simmonds, LCSW

## 2024-05-25 NOTE — Progress Notes (Deleted)
 Orthopedic Surgery Progress Note   Assessment: Patient is a 83 y.o. male with left femoral neck fracture status post percutaneous pinning. Left proximal humerus fracture being treated non-operatively   Plan: -Operative plans: complete -Diet: regular -DVT ppx: aspirin  81mg  BID -Antibiotics: ancef  x2 post-op doses -Weight bearing status: WBAT LLE, NWB LUE in sling -PT evaluate and treat -Pain control -Dispo: per primary  ___________________________________________________________________________  Subjective: No acute events overnight. Sleeping this morning but awakes to voice. States hip pain is better this morning than yesterday. Has not tried mobilizing yet.    Physical Exam:  General: no acute distress, appears stated age Neurologic: sleeping but awakes to voice, answering questions appropriately, following commands Respiratory: unlabored breathing on room air, symmetric chest rise Psychiatric: appropriate affect, normal cadence to speech  MSK:   -Left upper extremity  Sling in place Fires deltoid, biceps, triceps, wrist extensors, wrist flexors, finger extensors, finger flexors  AIN/PIN/IO intact  Palpable radial pulse  Sensation intact to light touch in median/ulnar/radial/axillary nerve distributions  Hand warm and well perfused  -Left lower extremity  Dressing over hip c/d/i EHL/TA/GSC intact Plantarflexes and dorsiflexes toes Sensation intact to light touch in sural, saphenous, tibial, deep peroneal, and superficial peroneal nerve distributions Foot warm and well perfused   Yesterday's total administered Morphine  Milligram Equivalents: 7.5   Patient name: Michael Odonnell Patient MRN: [id number] Date: 05/25/2024

## 2024-05-25 NOTE — TOC Initial Note (Signed)
 Transition of Care [facility]) - Initial/Assessment Note    Patient Details  Name: Michael Odonnell MRN: [id number] Date of Birth: 03/25/1941  Transition [facility]) CM/SW Contact:    Bridget Cordella Simmonds, LCSW Phone Number: 05/25/2024, 11:53 AM  Clinical Narrative:       CSW met with pt and wife [name] regarding PT recommendation for SNF.  Pt oriented x3, pleasant, most info from wife.  Pt from home with wife, no current services.  They are agreeable to SNF, medicare choice document provided, permission given to send out referral in the hub.  Referral sent out in hub for SNF. PASSR requires additional infor.    1600: Bed offers provided to pt and wife, they will accept offer at [facility].  [facility] confirmed they can receive pt tomorrow.            Expected Discharge Plan: Skilled Nursing Facility Barriers to Discharge: Continued Medical Work up, SNF Pending bed offer   Patient Goals and CMS Choice Patient states their goals for this hospitalization and ongoing recovery are:: to not fall CMS Medicare.gov Compare Post Acute Care list provided to:: Patient Represenative (must comment) (wife [name]) Choice offered to / list presented to : Patient, Spouse      Expected Discharge Plan and Services In-house Referral: Clinical Social Work   Post Acute Care Choice: Skilled Nursing Facility Living arrangements for the past 2 months: Single Family Home                                      Prior Living Arrangements/Services Living arrangements for the past 2 months: Single Family Home Lives with:: Spouse Patient language and need for interpreter reviewed:: Yes Do you feel safe going back to the place where you live?: Yes      Need for Family Participation in Patient Care: Yes (Comment) Care giver support system in place?: Yes (comment)   Criminal Activity/Legal Involvement Pertinent to Current Situation/Hospitalization: No - Comment as needed  Activities of Daily  Living   ADL Screening (condition at time of admission) Independently performs ADLs?: Yes (appropriate for developmental age) Is the patient deaf or have difficulty hearing?: No Does the patient have difficulty seeing, even when wearing glasses/contacts?: No Does the patient have difficulty concentrating, remembering, or making decisions?: Yes  Permission Sought/Granted         Emotional Assessment Appearance:: Appears stated age Attitude/Demeanor/Rapport: Engaged Affect (typically observed): Pleasant Orientation: : Oriented to Self, Oriented to Place, Oriented to Situation      Admission diagnosis:  Parkinson's disease (HCC) [G20.A1] Fall, initial encounter [W19.XXXA] Closed fracture of neck of left femur, initial encounter (HCC) [S72.002A] Closed left hip fracture, initial encounter (HCC) [S72.002A] Parkinsonism, unspecified Parkinsonism type (HCC) [G20.C] Other closed displaced fracture of proximal end of left humerus, initial encounter [S42.292A] Patient Active Problem List   Diagnosis Date Noted   Pressure injury of skin 05/24/2024   Closed fracture of neck of left femur (HCC) 05/23/2024   Closed fracture of proximal end of left humerus 05/23/2024   Chronic anticoagulation 11/10/2019   PAF (paroxysmal atrial fibrillation) (HCC) 10/19/2019   Essential hypertension 10/19/2019   Parkinson's disease (HCC) 10/19/2019   BPH (benign prostatic hyperplasia) 10/19/2019   Nausea 11/07/2017   PCP:  Nichole Senior, MD Pharmacy:   [facility] [phone] - [place], [place] - [address] [place] [address] Phone: [phone] Fax: [phone]  [facility]) - [place], [place] - [address] [facility] [address] [place] [address] Phone: [phone] Fax: [phone]  [facility] [address] [place] [address] Phone: [phone] Fax:  [phone]  [facility] - [place], [place] - [address] [place]  [address] Phone: [phone] Fax: [phone]  [facility] [address] [place] [address] Phone: [phone] Fax: [phone]     Social Drivers of Health (SDOH) Social History: SDOH Screenings   Food Insecurity: Patient Unable To Answer (05/23/2024)  Housing: Unknown (05/23/2024)  Transportation Needs: Patient Unable To Answer (05/23/2024)  Utilities: Patient Unable To Answer (05/23/2024)  Social Connections: Moderately Isolated (05/23/2024)  Tobacco Use: Medium Risk (05/23/2024)   SDOH Interventions:     Readmission Risk Interventions     No data to display

## 2024-05-25 NOTE — Progress Notes (Deleted)
°  RE:  Michael Odonnell       Date of Birth: [birth year]      Date:  05/25/24        To Whom It May Concern:  Please be advised that the above-named patient has a primary diagnosis of dementia which supersedes any psychiatric diagnosis.                 MD signature                Date

## 2024-05-25 NOTE — Progress Notes (Signed)
 RE:  Michael Odonnell       Date of Birth:  [birth year]     Date:   05/25/24       To Whom It May Concern:  Please be advised that the above-named patient will require a short-term nursing home stay - anticipated 30 days or less for rehabilitation and strengthening.  The plan is for return home.                 MD signature                Date

## 2024-05-25 NOTE — Progress Notes (Signed)
 PROGRESS NOTE    Michael Odonnell  FMW:[id number] DOB: June 25, 1940 DOA: 05/23/2024 PCP: Nichole Senior, MD    Brief Narrative:   Michael Odonnell is a 83 y.o. male with past medical history significant for Parkinson disease, atrial fibrillation on Eliquis , hypertension, and arthritis presented to the hospital with left shoulder left hip and temple pain after falling at home.  Patient attributed that it was walking too fast with socks on a varnish floor.  He reported hitting his head and complained of pain in his left temple, left shoulder, and left hip.  In the ED, vitals were stable.  Labs were notable for mild leukocytosis at 14.6 with normal lactic.  X-ray showed left hip fracture and proximal left humerus fracture.  CT head/C-spine was negative for acute findings.  Orthopedic surgery (Dr. Georgina) was consulted by the ED PA and the patient was treated with Dilaudid  and fentanyl  and was admitted hospital for further evaluation and treatment..     Assessment and Plan:  Principal Problem:   Closed fracture of proximal end of left humerus Active Problems:   PAF (paroxysmal atrial fibrillation) (HCC)   Essential hypertension   Parkinson's disease (HCC)   Closed fracture of neck of left femur (HCC)   Pressure injury of skin   Left hip hip fracture status post mechanical fall. Patient was on Eliquis  last dose 12/12.  Orthopedics was consulted and underwent percutaneous fixation of the femoral neck by orthopedics on 05/23/2024 intervention... Was initially started on aspirin  but will be transition to Eliquis .  Discussed with orthopedics about.  Patient is weightbearing as tolerated on the lower extremity but nonweightbearing on the left left upper extremity in a sling.  PT OT evaluation recommends skilled nursing facility   Left proximal humerus fracture  Continue immobilization in a sling..  Nonoperative treatment.  Follow orthopedic recommendations.  Complains of mild pain.    Atrial fibrillation   Continue Cardizem .  Will resume Eliquis  from today.  Discontinue aspirin    Parkinson disease  - Continue Sinemet .  Has mild tremors.   Essential HTN  ACE inhibitors on hold for now.  On as needed hydralazine .   Pressure injury sacrum stage I.  Present on admission.  Continue pressure ulcer prevention protocol. Wound 05/23/24 1652 Pressure Injury Sacrum Upper Stage 1 -  Intact skin with non-blanchable redness of a localized area usually over a bony prominence. (Active)      DVT prophylaxis: SCDs Start: 05/23/24 0332,  apixaban  (ELIQUIS ) tablet 5 mg   Code Status:     Code Status: Full Code  Disposition: Skilled nursing facility as per PT evaluation  Status is: Inpatient Remains inpatient appropriate because: Need for rehabilitation   Family Communication: Spoke with the spouse at bedside on 05/24/2024  Consultants:  Orthopedics  Procedures:  Percutaneous fixation of the left femoral neck on 05/23/2024 Left upper extremity in a sling  Antimicrobials:  None  Anti-infectives (From admission, onward)    Start     Dose/Rate Route Frequency Ordered Stop   05/23/24 2030  ceFAZolin  (ANCEF ) IVPB 2g/100 mL premix        2 g 200 mL/hr over 30 Minutes Intravenous Every 8 hours 05/23/24 1640 05/24/24 1248   05/23/24 1318  vancomycin  (VANCOCIN ) powder  Status:  Discontinued          As needed 05/23/24 1319 05/23/24 1343   05/23/24 0630  ceFAZolin  (ANCEF ) IVPB 2g/100 mL premix        2 g 200 mL/hr over 30 Minutes Intravenous  To Surgery 05/23/24 0615 05/23/24 1259        Subjective: Today, patient was seen and examined at bedside.  Patient complains of pain over the left upper extremity.  Has been having some cough.  Poor historian.   Objective: Vitals:   05/24/24 1943 05/25/24 0347 05/25/24 0750 05/25/24 0925  BP: (!) 134/56 (!) 139/111 (!) 134/51 (!) 151/61  Pulse: 73 71 73 77  Resp: 18 18 16 18   Temp: 98.2 F (36.8 C) 98.4 F (36.9 C) 98.1 F (36.7 C) 99.4 F (37.4  C)  TempSrc: Oral Oral  Oral  SpO2: 90% 98% 100% 99%  Weight:      Height:        Intake/Output Summary (Last 24 hours) at 05/25/2024 1120 Last data filed at 05/24/2024 1700 Gross per 24 hour  Intake 240 ml  Output --  Net 240 ml   Filed Weights   05/23/24 0056  Weight: 72 kg    Physical Examination: Body mass index is 24.14 kg/m.   General:  Average built, not in obvious distress, alert awake Communicative, elderly male, oriented to self, HENT:   No scleral pallor or icterus noted. Oral mucosa is moist.  Chest: Diminished breath sounds bilaterally.  No wheezes or crackles. CVS: S1 &S2 heard. No murmur.  Regular rate and rhythm. Abdomen: Soft, nontender, nondistended.  Bowel sounds are heard.   Extremities: No cyanosis, clubbing or edema.  Peripheral pulses are palpable.  Left upper extremity in a sling. Psych: Alert, awake and oriented, normal mood CNS:  No cranial nerve deficits.  Moves all extremities.  Tremors noted in the upper extremities.  Left hip with dressing. Skin: Warm and dry.  Intermittently confused  Data Reviewed:   CBC: Recent Labs  Lab 05/23/24 0102 05/23/24 0103 05/23/24 0352 05/24/24 0351  WBC 14.6*  --  15.9* 10.7*  HGB 12.6* 12.6* 11.4* 8.8*  HCT 37.6* 37.0* 34.1* 26.3*  MCV 94.7  --  93.4 93.3  PLT 223  --  230 174    Basic Metabolic Panel: Recent Labs  Lab 05/23/24 0102 05/23/24 0103 05/23/24 0352 05/24/24 0351  NA 139 140 138 139  K 4.6 4.4 4.4 4.4  CL 104 106 105 105  CO2 24  --  26 22  GLUCOSE 129* 130* 147* 138*  BUN 29* 35* 31* 31*  CREATININE 1.16 1.30* 1.17 1.30*  CALCIUM  9.0  --  9.1 8.4*    Liver Function Tests: Recent Labs  Lab 05/23/24 0102  AST 25  ALT 8  ALKPHOS 74  BILITOT 0.7  PROT 6.7  ALBUMIN  3.9     Radiology Studies: DG HIP UNILAT WITH PELVIS 2-3 VIEWS LEFT Result Date: 05/23/2024 CLINICAL DATA:  [id number] Surgery, elective [id number] EXAM: DG HIP (WITH OR WITHOUT PELVIS) 2-3V LEFT COMPARISON:   May 23, 2024 FINDINGS: Spot fluoroscopy images were obtained for surgical planning purposes. This demonstrates placement of femoral neck screws through a femoral neck fracture. Fracture fragments are in similar alignment. Time: 92.2 seconds Dose: 24.43 mGy Please reference procedure report for further details. IMPRESSION: Spot fluoroscopy images obtained for surgical planning purposes. Electronically Signed   By: Corean Salter M.D.   On: 05/23/2024 18:14   DG C-Arm 1-60 Min-No Report Result Date: 05/23/2024 Fluoroscopy was utilized by the requesting physician.  No radiographic interpretation.   DG C-Arm 1-60 Min-No Report Result Date: 05/23/2024 Fluoroscopy was utilized by the requesting physician.  No radiographic interpretation.   DG C-Arm 1-60 Min-No Report Result  Date: 05/23/2024 Fluoroscopy was utilized by the requesting physician.  No radiographic interpretation.      LOS: 2 days    Vernal Alstrom, MD [facility] Available via Epic secure chat 7am-7pm After these hours, please refer to coverage provider listed on [email or web address] 05/25/2024, 11:20 AM

## 2024-05-25 NOTE — Progress Notes (Signed)
°  RE:  Michael Odonnell       Date of Birth: [birth year]      Date:  05/25/24        To Whom It May Concern:  Please be advised that the above-named patient has a primary diagnosis of dementia which supersedes any psychiatric diagnosis.                 MD signature                Date

## 2024-05-26 LAB — CBC
HCT: 24 % — ABNORMAL LOW (ref 39.0–52.0)
Hemoglobin: 7.9 g/dL — ABNORMAL LOW (ref 13.0–17.0)
MCH: 31 pg (ref 26.0–34.0)
MCHC: 32.9 g/dL (ref 30.0–36.0)
MCV: 94.1 fL (ref 80.0–100.0)
Platelets: 172 K/uL (ref 150–400)
RBC: 2.55 MIL/uL — ABNORMAL LOW (ref 4.22–5.81)
RDW: 13 % (ref 11.5–15.5)
WBC: 10.6 K/uL — ABNORMAL HIGH (ref 4.0–10.5)
nRBC: 0 % (ref 0.0–0.2)

## 2024-05-26 LAB — BASIC METABOLIC PANEL WITH GFR
Anion gap: 5 (ref 5–15)
BUN: 28 mg/dL — ABNORMAL HIGH (ref 8–23)
CO2: 27 mmol/L (ref 22–32)
Calcium: 8.2 mg/dL — ABNORMAL LOW (ref 8.9–10.3)
Chloride: 104 mmol/L (ref 98–111)
Creatinine, Ser: 0.97 mg/dL (ref 0.61–1.24)
GFR, Estimated: >60 mL/min (ref >60)
Glucose, Bld: 129 mg/dL — ABNORMAL HIGH (ref 70–99)
Potassium: 3.9 mmol/L (ref 3.5–5.1)
Sodium: 136 mmol/L (ref 135–145)

## 2024-05-26 MED ORDER — METHOCARBAMOL 500 MG PO TABS: 500.0000 mg | ORAL_TABLET | Freq: Four times a day (QID) | ORAL | Status: AC | PRN

## 2024-05-26 MED ORDER — OXYCODONE HCL 5 MG PO TABS: 5.0000 mg | ORAL_TABLET | Freq: Four times a day (QID) | ORAL | 0 refills | Status: DC | PRN

## 2024-05-26 MED ORDER — BISACODYL 5 MG PO TBEC: 5.0000 mg | DELAYED_RELEASE_TABLET | Freq: Every day | ORAL | Status: DC | PRN

## 2024-05-26 MED ORDER — ENSURE SURGERY PO LIQD: 237.0000 mL | Freq: Two times a day (BID) | ORAL | Status: DC

## 2024-05-26 NOTE — Care Management Important Message (Signed)
 Important Message  Patient Details  Name: Michael Odonnell MRN: [id number] Date of Birth: 1941/02/19   Important Message Given:  No     [name] 05/26/2024, 12:45 PM

## 2024-05-26 NOTE — TOC Progression Note (Addendum)
 Transition of Care [facility]) - Progression Note    Patient Details  Name: Michael Odonnell MRN: [id number] Date of Birth: [birth year]  Transition of Care [facility]) CM/SW Contact  Bridget Cordella Simmonds, LCSW Phone Number: 05/26/2024, 8:10 AM  Clinical Narrative:   PASSR received: [id number].  SNF auth request submitted and approved: [id number], 3 days: 12/16-12/18.  MD informed.   Expected Discharge Plan: Skilled Nursing Facility Barriers to Discharge: Continued Medical Work up, SNF Pending bed offer               Expected Discharge Plan and Services In-house Referral: Clinical Social Work   Post Acute Care Choice: Skilled Nursing Facility Living arrangements for the past 2 months: Single Family Home                                       Social Drivers of Health (SDOH) Interventions SDOH Screenings   Food Insecurity: Patient Unable To Answer (05/23/2024)  Housing: Unknown (05/23/2024)  Transportation Needs: Patient Unable To Answer (05/23/2024)  Utilities: Patient Unable To Answer (05/23/2024)  Social Connections: Moderately Isolated (05/23/2024)  Tobacco Use: Medium Risk (05/23/2024)    Readmission Risk Interventions     No data to display

## 2024-05-26 NOTE — Progress Notes (Signed)
 Patient discharged, Important Message Letter mailed to patient.

## 2024-05-26 NOTE — Progress Notes (Signed)
 Discharge Summary: DC order noted per MD. DC RN at bedside. Med details completed. AVS printed, set in discharge packet at with patient chart with prescription, pending med necessity.

## 2024-05-26 NOTE — Progress Notes (Signed)
 Patient states he only wants tylenol  for pain.

## 2024-05-26 NOTE — Discharge Summary (Addendum)
 Physician Discharge Summary  Michael Odonnell FMW:[id number] DOB: Mar 05, 1941 DOA: 05/23/2024  PCP: Nichole Senior, MD  Admit date: 05/23/2024 Discharge date: 05/26/2024  Admitted From: Home  Discharge disposition: Skilled nursing facility   Recommendations for Outpatient Follow-Up:   Follow up with your primary care provider in one week.  Check CBC, BMP, magnesium  in the next visit Follow-up with orthopedics Dr. Georgina as outpatient in 1 week.   Discharge Diagnosis:   Principal Problem:   Closed fracture of proximal end of left humerus Active Problems:   PAF (paroxysmal atrial fibrillation) (HCC)   Essential hypertension   Parkinson's disease (HCC)   Closed fracture of neck of left femur (HCC)   Pressure injury of skin    Discharge Condition: Improved.  Diet recommendation:   Regular.  Wound care: None.  Code status: Full.   History of Present Illness:   Michael Odonnell is a 83 y.o. male with past medical history significant for Parkinson disease, atrial fibrillation on Eliquis , hypertension, and arthritis presented to the hospital with left shoulder left hip and temple pain after falling at home.  Patient attributed that it was walking too fast with socks on a varnish floor.  He reported hitting his head and complained of pain in his left temple, left shoulder, and left hip.  In the ED, vitals were stable.  Labs were notable for mild leukocytosis at 14.6 with normal lactic.  X-ray showed left hip fracture and proximal left humerus fracture.  CT head/C-spine was negative for acute findings.  Orthopedic surgery (Dr. Georgina) was consulted by the ED PA and the patient was treated with Dilaudid  and fentanyl  and was admitted hospital for further evaluation and treatment.SABRA    Hospital Course:   Following conditions were addressed during hospitalization as listed below,  Left hip hip fracture status post mechanical fall. Patient was on Eliquis  last dose 12/12.  Orthopedics was  consulted and underwent percutaneous fixation of the femoral neck by orthopedics on 05/23/2024 intervention.will transition to Eliquis  from home.  Patient is weightbearing as tolerated on the lower extremity but nonweightbearing on the left left upper extremity in a sling.  PT OT evaluation recommends skilled nursing facility placement.  Patient will need to follow-up with orthopedics as outpatient in 1 week.   Left proximal humerus fracture  Continue immobilization in a sling..  Nonoperative treatment.  Follow orthopedic recommendations as outpatient.  Stable    Atrial fibrillation  Continue Cardizem  and Eliquis .  Rate controlled   Parkinson disease  - Continue Sinemet .  Has mild tremors.   Essential HTN  On ACE inhibitors as outpatient.   Pressure injury sacrum stage I.  Present on admission.  Continue pressure ulcer prevention protocol. Wound 05/23/24 1652 Pressure Injury Sacrum Upper Stage 1 -  Intact skin with non-blanchable redness of a localized area usually over a bony prominence. (Active)    Disposition.  At this time, patient is stable for disposition to skilled nursing facility with outpatient orthopedic follow-up  Medical Consultants:   Orthopedics  Procedures:    Percutaneous fixation of the femoral neck by orthopedics on 07/25/2023. Left upper extremity in a sling Subjective:   Today, patient was seen and examined at bedside.  Denies any pain, nausea, vomiting, fever, chills or rigor.  Denies any shortness of breath or dyspnea.  Discharge Exam:   Vitals:   05/26/24 0322 05/26/24 0714  BP: (!) 140/80 (!) 130/59  Pulse: 64 63  Resp: 18 16  Temp: 98.7 F (37.1 C) 98.6 F (  37 C)  SpO2: 100% 99%   Vitals:   05/25/24 0925 05/25/24 2044 05/26/24 0322 05/26/24 0714  BP: (!) 151/61 (!) 149/68 (!) 140/80 (!) 130/59  Pulse: 77 67 64 63  Resp: 18 18 18 16   Temp: 99.4 F (37.4 C) 99 F (37.2 C) 98.7 F (37.1 C) 98.6 F (37 C)  TempSrc: Oral     SpO2: 99% 100%  100% 99%  Weight:      Height:       Body mass index is 24.14 kg/m.   General: Alert awake, not in obvious distress, elderly male HENT: pupils equally reacting to light,  No scleral pallor or icterus noted. Oral mucosa is moist.  Chest: Diminished breath sounds bilaterally. CVS: S1 &S2 heard. No murmur.  Regular rate and rhythm. Abdomen: Soft, nontender, nondistended.  Bowel sounds are heard.   Extremities: No cyanosis, clubbing or edema.  Peripheral pulses are palpable.  Left upper extremity in a sling,  Left hip with dressing. Psych: Alert, awake and oriented, normal mood CNS:  No cranial nerve deficits.  Moves extremity Skin: Warm and dry.  No rashes noted.  The results of significant diagnostics from this hospitalization (including imaging, microbiology, ancillary and laboratory) are listed below for reference.     Diagnostic Studies:   DG HIP UNILAT WITH PELVIS 2-3 VIEWS LEFT Result Date: 05/23/2024 CLINICAL DATA:  [id number] Surgery, elective [id number] EXAM: DG HIP (WITH OR WITHOUT PELVIS) 2-3V LEFT COMPARISON:  May 23, 2024 FINDINGS: Spot fluoroscopy images were obtained for surgical planning purposes. This demonstrates placement of femoral neck screws through a femoral neck fracture. Fracture fragments are in similar alignment. Time: 92.2 seconds Dose: 24.43 mGy Please reference procedure report for further details. IMPRESSION: Spot fluoroscopy images obtained for surgical planning purposes. Electronically Signed   By: Corean Salter M.D.   On: 05/23/2024 18:14   DG C-Arm 1-60 Min-No Report Result Date: 05/23/2024 Fluoroscopy was utilized by the requesting physician.  No radiographic interpretation.   DG C-Arm 1-60 Min-No Report Result Date: 05/23/2024 Fluoroscopy was utilized by the requesting physician.  No radiographic interpretation.   DG C-Arm 1-60 Min-No Report Result Date: 05/23/2024 Fluoroscopy was utilized by the requesting physician.  No radiographic  interpretation.   CT HIP LEFT WO CONTRAST Result Date: 05/23/2024 EXAM: CT OF THE LEFT HIP WITHOUT IV CONTRAST 05/23/2024 07:03:33 AM TECHNIQUE: CT of the left hip was performed without the administration of intravenous contrast. Multiplanar reformatted images are provided for review. Automated exposure control, iterative reconstruction, and/or weight based adjustment of the mA/kV was utilized to reduce the radiation dose to as low as reasonably achievable. COMPARISON: CTA abdomen and pelvis 05/06/2024. CLINICAL HISTORY: Hip surgical planning. FINDINGS: BONES: Osteopenia. There is an acute transverse oblique mid-cervical proximal left femoral fracture with impaction at the fracture site and about 1 cortex width of cephalad translation of the main distal fracture fragment. Spurring is seen at the left SI joint and symphysis. No destructive bone lesion is seen. There are 2 small sclerotic foci in the right pubic bone, 2 in the sacrum, probable bone islands but metastases are in the differential. No aggressive appearing osseous abnormality or periostitis. There is no further evidence of fractures. SOFT TISSUE: No significant soft tissue edema or fluid collections. No soft tissue mass. No hematoma is seen around the fracture bed. JOINT: Partial joint space loss at the hip with acetabular osteophytes, consistent with osteoarthritis. No erosive arthropathy. No significant left hip joint effusion. INTRAPELVIC CONTENTS: Sigmoid diverticulosis and  moderate fecal retention. 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 There are no overt findings of hernia or incarceration. No dilated bowel is seen in the visualized portion of the pelvis. There is iliofemoral calcific arteriosclerosis. No pelvic mass, hemorrhage, or free fluid. Multiple pelvic phleboliths. . Enlarged prostate again  noted with dystrophic calcifications. The bladder is catheterized and contracted. Both testicles are in the scrotal sac. IMPRESSION: 1. Acute transverse oblique mid-cervical proximal left femoral fracture with impaction and approximately one cortex width cephalad translation of the distal fragment. No peri-fracture hematoma identified. 2. Partial hip joint space loss with acetabular osteophytes, consistent with osteoarthritis. No erosive arthropathy. 3. Bilateral inguinal hernias containing bowel, seen previously. 4. Prostatomegaly. Electronically signed by: Francis Quam MD 05/23/2024 07:39 AM EST RP Workstation: [id number]   CT Shoulder Left Wo Contrast Result Date: 05/23/2024 EXAM: CT LEFT SHOULDER, WITHOUT IV CONTRAST 05/23/2024 01:37:40 AM TECHNIQUE: Axial images were acquired through the left shoulder without IV contrast. Reformatted images were reviewed. Automated exposure control, iterative reconstruction, and/or weight based adjustment of the mA/kV was utilized to reduce the radiation dose to as low as reasonably achievable. COMPARISON: Plain film from earlier in the same day. CLINICAL HISTORY: Clinical history known left proximal humeral fracture FINDINGS: BONES: There is again noted a comminuted fracture of the proximal left humerus involving primarily the surgical neck. Impaction at the fracture site is noted. No other fractures are noted. JOINTS: The humeral head is well seated within the glenoid. No significant joint effusion is noted. SOFT TISSUES: No soft tissue hematoma is noted. No other focal abnormality is seen. IMPRESSION: 1. Comminuted fracture of the proximal left humerus involving primarily the surgical neck with impaction at the fracture site. 2. The humeral head remains well seated within the glenoid. 3. No additional fractures identified. Electronically signed by: Oneil Devonshire MD 05/23/2024 01:46 AM EST RP Workstation: [id number]   CT CERVICAL SPINE WO CONTRAST Result Date:  05/23/2024 EXAM: CT CERVICAL SPINE WITHOUT CONTRAST 05/23/2024 01:34:14 AM TECHNIQUE: CT of the cervical spine was performed without the administration of intravenous contrast. Multiplanar reformatted images are provided for review. Automated exposure control, iterative reconstruction, and/or weight based adjustment of the mA/kV was utilized to reduce the radiation dose to as low as reasonably achievable. COMPARISON: None available. CLINICAL HISTORY: Polytrauma, blunt. FINDINGS: BONES AND ALIGNMENT: No acute cervical spine fracture. There is a minimally displaced fracture of the anterior superior corner of T2 favored to be acute. No traumatic malalignment. 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 SOFT TISSUES: No prevertebral soft tissue swelling. IMPRESSION: 1. Minimally displaced fracture of the anterior superior corner of T2, favored to be acute. 2. No acute cervical spine fracture. 3. 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 Electronically signed by: Franky Stanford MD 05/23/2024 01:45 AM EST RP Workstation: [id number]   CT HEAD WO CONTRAST Result Date: 05/23/2024 EXAM: CT HEAD WITHOUT 05/23/2024 01:34:14 AM TECHNIQUE: CT of the head was performed without the administration of intravenous contrast. Automated exposure control, iterative reconstruction, and/or weight based adjustment of the mA/kV was utilized to reduce the radiation dose to as low as reasonably achievable. COMPARISON: None available. CLINICAL HISTORY: Head trauma, moderate-severe FINDINGS: BRAIN AND VENTRICLES: No acute intracranial hemorrhage. No mass effect or midline shift. No extra-axial fluid collection. No evidence of acute infarct. No hydrocephalus. Mild diffuse  cerebral volume loss. ORBITS: No acute abnormality. SINUSES AND MASTOIDS: No acute abnormality. SOFT TISSUES AND SKULL: No acute skull fracture. No acute soft tissue  abnormality. IMPRESSION: 1. No acute intracranial abnormality. 2. Mild diffuse cerebral volume loss. Electronically signed by: Franky Stanford MD 05/23/2024 01:40 AM EST RP Workstation: [id number]   DG Shoulder Left Result Date: 05/23/2024 EXAM: 3 VIEW(S) XRAY OF THE LEFT SHOULDER 05/23/2024 01:23:49 AM COMPARISON: None available. CLINICAL HISTORY: Trauma FINDINGS: BONES AND JOINTS: Comminuted fracture of left humeral head and neck with approximately 1 shaft width anterior displacement of distal fracture fragment. [facility] joint is unremarkable. SOFT TISSUES: No abnormal calcifications. Visualized lung is unremarkable. IMPRESSION: 1. Comminuted left humeral head/neck fracture, as above. Electronically signed by: Pinkie Pebbles MD 05/23/2024 01:29 AM EST RP Workstation: [id number]   DG Pelvis Portable Result Date: 05/23/2024 EXAM: 1 VIEW(S) XRAY OF THE PELVIS 05/23/2024 01:23:49 AM COMPARISON: None available. CLINICAL HISTORY: Trauma FINDINGS: BONES AND JOINTS: Subcapital left femoral neck fracture. Mild bilateral hip osteoarthritis. SOFT TISSUES: The soft tissues are unremarkable. IMPRESSION: 1. Subcapital left femoral neck fracture. Electronically signed by: Pinkie Pebbles MD 05/23/2024 01:28 AM EST RP Workstation: [id number]   DG Chest Port 1 View Result Date: 05/23/2024 EXAM: 1 VIEW(S) XRAY OF THE CHEST 05/23/2024 01:23:49 AM COMPARISON: 10/30/2019 CLINICAL HISTORY: Trauma FINDINGS: LUNGS AND PLEURA: No focal pulmonary opacity. No pleural effusion. No pneumothorax. HEART AND MEDIASTINUM: No acute abnormality of the cardiac and mediastinal silhouettes. BONES AND SOFT TISSUES: No acute osseous abnormality. IMPRESSION: 1. No acute cardiopulmonary process. Electronically signed by: Pinkie Pebbles MD 05/23/2024 01:27 AM EST RP Workstation: [id number]     Labs:   Basic Metabolic Panel: Recent Labs  Lab 05/23/24 0102 05/23/24 0103 05/23/24 0352 05/24/24 0351 05/26/24 0345  NA 139 140 138 139  136  K 4.6 4.4 4.4 4.4 3.9  CL 104 106 105 105 104  CO2 24  --  26 22 27   GLUCOSE 129* 130* 147* 138* 129*  BUN 29* 35* 31* 31* 28*  CREATININE 1.16 1.30* 1.17 1.30* 0.97  CALCIUM  9.0  --  9.1 8.4* 8.2*   GFR Estimated Creatinine Clearance: 53.9 mL/min (by C-G formula based on SCr of 0.97 mg/dL). Liver Function Tests: Recent Labs  Lab 05/23/24 0102  AST 25  ALT 8  ALKPHOS 74  BILITOT 0.7  PROT 6.7  ALBUMIN  3.9   No results for input(s): LIPASE, AMYLASE in the last 168 hours. No results for input(s): AMMONIA in the last 168 hours. Coagulation profile Recent Labs  Lab 05/23/24 0102  INR 1.2    CBC: Recent Labs  Lab 05/23/24 0102 05/23/24 0103 05/23/24 0352 05/24/24 0351 05/26/24 0345  WBC 14.6*  --  15.9* 10.7* 10.6*  HGB 12.6* 12.6* 11.4* 8.8* 7.9*  HCT 37.6* 37.0* 34.1* 26.3* 24.0*  MCV 94.7  --  93.4 93.3 94.1  PLT 223  --  230 174 172   Cardiac Enzymes: No results for input(s): CKTOTAL, CKMB, CKMBINDEX, TROPONINI in the last 168 hours. BNP: Invalid input(s): POCBNP CBG: No results for input(s): GLUCAP in the last 168 hours. D-Dimer No results for input(s): DDIMER in the last 72 hours. Hgb A1c No results for input(s): HGBA1C in the last 72 hours. Lipid Profile No results for input(s): CHOL, HDL, LDLCALC, TRIG, CHOLHDL, LDLDIRECT in the last 72 hours. Thyroid  function studies No results for input(s): TSH, T4TOTAL, T3FREE, THYROIDAB in the last 72 hours.  Invalid input(s): FREET3 Anemia work up No results for input(s): VITAMINB12, FOLATE,  FERRITIN, TIBC, IRON, RETICCTPCT in the last 72 hours. Microbiology No results found for this or any previous visit (from the past 240 hours).   Discharge Instructions:   Discharge Instructions     Diet general   Complete by: As directed    Discharge instructions   Complete by: As directed    Follow-up with your primary care provider at the skilled  nursing facility in 3 to 5 days.  Check blood work at that time.  Seek medical attention for worsening symptoms.  Weightbearing as tolerated in the left lower extremity.  Nonweightbearing on the left upper extremity and keep in a sling. Follow up with Dr Georgina, orthopedics in one week for post op follow-up.   Increase activity slowly   Complete by: As directed    No wound care   Complete by: As directed       Allergies as of 05/26/2024       Reactions   Hydrochlorothiazide Nausea Only, Palpitations   Codeine Nausea And Vomiting   Doxycycline Nausea Only   Metronidazole Nausea Only   Mysoline [primidone] Other (See Comments)   Change in balance        Medication List     TAKE these medications    carbidopa -levodopa  25-100 MG tablet Commonly known as: SINEMET  IR TAKE 2 TABLETS BY MOUTH AT 6 AM, 1 TABLET AT 10 AM, 1 TABLET AT 2 PM, AND 1 TABLET AT 6 PM The timing of this medication is very important.   acetaminophen  500 MG tablet Commonly known as: TYLENOL  Take 1,000 mg by mouth every 6 (six) hours as needed for mild pain or headache.   bisacodyl  5 MG EC tablet Commonly known as: DULCOLAX Take 1 tablet (5 mg total) by mouth daily as needed for moderate constipation.   Centrum Silver 50+Men Tabs Take 1 tablet by mouth daily in the afternoon.   clobetasol cream 0.05 % Commonly known as: TEMOVATE Apply topically.   diltiazem  360 MG 24 hr capsule Commonly known as: TIAZAC  TAKE 1 CAPSULE BY MOUTH DAILY   Eliquis  5 MG Tabs tablet Generic drug: apixaban  TAKE 1 TABLET BY MOUTH TWICE  DAILY   feeding supplement Liqd Take 237 mLs by mouth 2 (two) times daily between meals.   finasteride  5 MG tablet Commonly known as: PROSCAR  Take 5 mg by mouth daily in the afternoon.   Magnesium  400 MG Tabs Take 400 mg by mouth daily in the afternoon.   methocarbamol  500 MG tablet Commonly known as: ROBAXIN  Take 1 tablet (500 mg total) by mouth every 6 (six) hours as needed for  muscle spasms.   mirabegron  ER 50 MG Tb24 tablet Commonly known as: MYRBETRIQ  Take 50 mg by mouth daily at 4 PM.   niacin  500 MG tablet Commonly known as: VITAMIN B3 Take 500 mg by mouth 2 (two) times daily with a meal.   oxyCODONE  5 MG immediate release tablet Commonly known as: Oxy IR/ROXICODONE  Take 1 tablet (5 mg total) by mouth every 6 (six) hours as needed for moderate pain (pain score 4-6) or severe pain (pain score 7-10).   perindopril 8 MG tablet Commonly known as: ACEON Take 8 mg by mouth in the morning.   polyethylene glycol 17 g packet Commonly known as: MIRALAX  / GLYCOLAX  Take 17 g by mouth daily as needed for moderate constipation.   Systane Hydration PF 0.4-0.3 % Soln Generic drug: Polyethyl Glyc-Propyl Glyc PF Place 1 drop into both eyes daily as needed (dry eyes; eye  irritations).   tamsulosin  0.4 MG Caps capsule Commonly known as: Flomax  Take 1 capsule (0.4 mg total) by mouth daily. What changed: when to take this   TUMS PO Take 2 tablets by mouth daily as needed (heartburn).        Contact information for follow-up providers     Georgina Ozell LABOR, MD Follow up in 1 week(s).   Specialty: Orthopedic Surgery Why: post op followup Contact information: [address] [place] [address] [phone]              Contact information for after-discharge care     Destination     [facility] [place] .   Service: Skilled Nursing Contact information: [address] [place]  [address] [phone]                      Time coordinating discharge: 39 minutes  Signed:  Areebah Meinders  [facility] 05/26/2024, 10:58 AM

## 2024-05-26 NOTE — TOC Transition Note (Signed)
 Transition of Care [facility]) - Discharge Note   Patient Details  Name: Michael Odonnell MRN: [id number] Date of Birth: 03/17/1941  Transition of [facility]) CM/SW Contact:  Bridget Cordella Simmonds, LCSW Phone Number: 05/26/2024, 10:49 AM   Clinical Narrative:   Pt discharging to [facility]. RN call report to [phone].   PTAR called [phone].  Final next level of care: Skilled Nursing Facility Barriers to Discharge: Barriers Resolved   Patient Goals and CMS Choice Patient states their goals for this hospitalization and ongoing recovery are:: to not fall CMS Medicare.gov Compare Post Acute Care list provided to:: Patient Represenative (must comment) (wife [name]) Choice offered to / list presented to : Patient, Spouse      Discharge Placement              Patient [facility] bed at: [facility] Patient to be transferred to facility by: ptar Name of family member notified: wife Edna in room Patient and family notified of of transfer: 05/26/24  Discharge Plan and Services Additional resources added to the After Visit Summary for   In-house Referral: Clinical Social Work   Post Acute Care Choice: Skilled Nursing Facility                               Social Drivers of Health (SDOH) Interventions SDOH Screenings   Food Insecurity: Patient Unable To Answer (05/23/2024)  Housing: Unknown (05/23/2024)  Transportation Needs: Patient Unable To Answer (05/23/2024)  Utilities: Patient Unable To Answer (05/23/2024)  Social Connections: Moderately Isolated (05/23/2024)  Tobacco Use: Medium Risk (05/23/2024)     Readmission Risk Interventions     No data to display

## 2024-05-27 ENCOUNTER — Encounter (HOSPITAL_COMMUNITY): Payer: Self-pay

## 2024-05-27 ENCOUNTER — Inpatient Hospital Stay (HOSPITAL_COMMUNITY)
Admission: EM | Admit: 2024-05-27 | Discharge: 2024-06-01 | DRG: 920 | Disposition: A | Discharge status: Skilled Nursing Facility | Source: Other Acute Inpatient Hospital | Attending: Family Medicine | Admitting: Family Medicine

## 2024-05-27 ENCOUNTER — Other Ambulatory Visit: Payer: Self-pay

## 2024-05-27 ENCOUNTER — Emergency Department (HOSPITAL_COMMUNITY)

## 2024-05-27 DIAGNOSIS — D649 Anemia, unspecified: Secondary | ICD-10-CM | POA: Insufficient documentation

## 2024-05-27 DIAGNOSIS — D62 Acute posthemorrhagic anemia: Secondary | ICD-10-CM | POA: Diagnosis present

## 2024-05-27 DIAGNOSIS — G20A1 Parkinson's disease without dyskinesia, without mention of fluctuations: Secondary | ICD-10-CM | POA: Diagnosis present

## 2024-05-27 DIAGNOSIS — D72829 Elevated white blood cell count, unspecified: Secondary | ICD-10-CM | POA: Diagnosis present

## 2024-05-27 DIAGNOSIS — S72002A Fracture of unspecified part of neck of left femur, initial encounter for closed fracture: Secondary | ICD-10-CM | POA: Diagnosis present

## 2024-05-27 DIAGNOSIS — Z87891 Personal history of nicotine dependence: Secondary | ICD-10-CM

## 2024-05-27 DIAGNOSIS — Z8249 Family history of ischemic heart disease and other diseases of the circulatory system: Secondary | ICD-10-CM

## 2024-05-27 DIAGNOSIS — S42202D Unspecified fracture of upper end of left humerus, subsequent encounter for fracture with routine healing: Secondary | ICD-10-CM

## 2024-05-27 DIAGNOSIS — N400 Enlarged prostate without lower urinary tract symptoms: Secondary | ICD-10-CM | POA: Diagnosis present

## 2024-05-27 DIAGNOSIS — L8990 Pressure ulcer of unspecified site, unspecified stage: Secondary | ICD-10-CM | POA: Diagnosis present

## 2024-05-27 DIAGNOSIS — Z808 Family history of malignant neoplasm of other organs or systems: Secondary | ICD-10-CM

## 2024-05-27 DIAGNOSIS — S72002D Fracture of unspecified part of neck of left femur, subsequent encounter for closed fracture with routine healing: Secondary | ICD-10-CM

## 2024-05-27 DIAGNOSIS — Z888 Allergy status to other drugs, medicaments and biological substances status: Secondary | ICD-10-CM

## 2024-05-27 DIAGNOSIS — Z79899 Other long term (current) drug therapy: Secondary | ICD-10-CM

## 2024-05-27 DIAGNOSIS — L89151 Pressure ulcer of sacral region, stage 1: Secondary | ICD-10-CM | POA: Diagnosis present

## 2024-05-27 DIAGNOSIS — T8189XA Other complications of procedures, not elsewhere classified, initial encounter: Principal | ICD-10-CM | POA: Diagnosis present

## 2024-05-27 DIAGNOSIS — I10 Essential (primary) hypertension: Secondary | ICD-10-CM | POA: Diagnosis present

## 2024-05-27 DIAGNOSIS — S42202A Unspecified fracture of upper end of left humerus, initial encounter for closed fracture: Secondary | ICD-10-CM | POA: Diagnosis present

## 2024-05-27 DIAGNOSIS — F05 Delirium due to known physiological condition: Secondary | ICD-10-CM | POA: Diagnosis present

## 2024-05-27 DIAGNOSIS — R410 Disorientation, unspecified: Principal | ICD-10-CM

## 2024-05-27 DIAGNOSIS — Z7901 Long term (current) use of anticoagulants: Secondary | ICD-10-CM

## 2024-05-27 DIAGNOSIS — I480 Paroxysmal atrial fibrillation: Secondary | ICD-10-CM | POA: Diagnosis present

## 2024-05-27 DIAGNOSIS — Z86007 Personal history of in-situ neoplasm of skin: Secondary | ICD-10-CM

## 2024-05-27 DIAGNOSIS — Z87730 Personal history of (corrected) cleft lip and palate: Secondary | ICD-10-CM

## 2024-05-27 DIAGNOSIS — M1990 Unspecified osteoarthritis, unspecified site: Secondary | ICD-10-CM | POA: Diagnosis present

## 2024-05-27 DIAGNOSIS — Z885 Allergy status to narcotic agent status: Secondary | ICD-10-CM

## 2024-05-27 DIAGNOSIS — Z860100 Personal history of colon polyps, unspecified: Secondary | ICD-10-CM

## 2024-05-27 DIAGNOSIS — Z881 Allergy status to other antibiotic agents status: Secondary | ICD-10-CM

## 2024-05-27 LAB — COMPREHENSIVE METABOLIC PANEL WITH GFR
ALT: <5 U/L (ref 0–44)
AST: 29 U/L (ref 15–41)
Albumin: 3.6 g/dL (ref 3.5–5.0)
Alkaline Phosphatase: 72 U/L (ref 38–126)
Anion gap: 12 (ref 5–15)
BUN: 28 mg/dL — ABNORMAL HIGH (ref 8–23)
CO2: 25 mmol/L (ref 22–32)
Calcium: 9.3 mg/dL (ref 8.9–10.3)
Chloride: 101 mmol/L (ref 98–111)
Creatinine, Ser: 0.98 mg/dL (ref 0.61–1.24)
GFR, Estimated: >60 mL/min (ref >60)
Glucose, Bld: 131 mg/dL — ABNORMAL HIGH (ref 70–99)
Potassium: 4 mmol/L (ref 3.5–5.1)
Sodium: 137 mmol/L (ref 135–145)
Total Bilirubin: 1 mg/dL (ref 0.0–1.2)
Total Protein: 6.3 g/dL — ABNORMAL LOW (ref 6.5–8.1)

## 2024-05-27 LAB — URINALYSIS, ROUTINE W REFLEX MICROSCOPIC
Bacteria, UA: NONE SEEN
Bilirubin Urine: NEGATIVE
Glucose, UA: NEGATIVE mg/dL
Ketones, ur: 5 mg/dL — AB
Leukocytes,Ua: NEGATIVE
Nitrite: NEGATIVE
Protein, ur: 30 mg/dL — AB
Specific Gravity, Urine: 1.019 (ref 1.005–1.030)
pH: 8 (ref 5.0–8.0)

## 2024-05-27 LAB — CBC
HCT: 25.4 % — ABNORMAL LOW (ref 39.0–52.0)
Hemoglobin: 8.6 g/dL — ABNORMAL LOW (ref 13.0–17.0)
MCH: 31.7 pg (ref 26.0–34.0)
MCHC: 33.9 g/dL (ref 30.0–36.0)
MCV: 93.7 fL (ref 80.0–100.0)
Platelets: 233 K/uL (ref 150–400)
RBC: 2.71 MIL/uL — ABNORMAL LOW (ref 4.22–5.81)
RDW: 12.9 % (ref 11.5–15.5)
WBC: 11.3 K/uL — ABNORMAL HIGH (ref 4.0–10.5)
nRBC: 0 % (ref 0.0–0.2)

## 2024-05-27 LAB — AMMONIA: Ammonia: <13 umol/L (ref 9–35)

## 2024-05-27 LAB — CBG MONITORING, ED: Glucose-Capillary: 134 mg/dL — ABNORMAL HIGH (ref 70–99)

## 2024-05-27 LAB — I-STAT CG4 LACTIC ACID, ED: Lactic Acid, Venous: 1.7 mmol/L (ref 0.5–1.9)

## 2024-05-27 NOTE — ED Triage Notes (Signed)
 Pt BIB [facility] from [facility] c/o AMS. Pts wife was talking to pt on the phone and stated he did not seem right. Per EMS, pt GCS was anywhere between 13-14. Pt has been in [facility] for fractured arm and femur. Pt on Eliquis .  CBG 197 RR 24 97% RA 148/68 HR 80

## 2024-05-27 NOTE — ED Provider Notes (Signed)
 [place] EMERGENCY DEPARTMENT AT [facility] Provider Note   CSN: [id number] Arrival date & time: 05/27/24  2057     Patient presents with: No chief complaint on file.   Michael Odonnell is a 83 y.o. male with past medical history of HTN, PAF, tremor, diverticulosis, parkinsons presents Emergency Department via EMS for evaluation of AMS.  Patient was recently admitted for left hip fracture, left femoral neck fracture. S/P percutaneous pinning on 05/23/2024. Left humerus fracture being treated nonoperatively.   Today, patient's wife was visiting patient at [facility] rehab where patient has been following surgery when she noted some AMS, tangible speech today at 1300.  Denies new falls  {Add pertinent medical, surgical, social history, OB history to HPI:32947} HPI     Prior to Admission medications  Medication Sig Start Date End Date Taking? Authorizing Provider  acetaminophen  (TYLENOL ) 500 MG tablet Take 1,000 mg by mouth every 6 (six) hours as needed for mild pain or headache.     [provider]  apixaban  (ELIQUIS ) 5 MG TABS tablet TAKE 1 TABLET BY MOUTH TWICE  DAILY 02/13/24   Pietro Redell RAMAN, MD  bisacodyl  (DULCOLAX) 5 MG EC tablet Take 1 tablet (5 mg total) by mouth daily as needed for moderate constipation. 05/26/24   Pokhrel, Laxman, MD  Calcium  Carbonate Antacid (TUMS PO) Take 2 tablets by mouth daily as needed (heartburn).     [provider]  carbidopa -levodopa  (SINEMET  IR) 25-100 MG tablet TAKE 2 TABLETS BY MOUTH AT 6 AM, 1 TABLET AT 10 AM, 1 TABLET AT 2 PM, AND 1 TABLET AT 6 PM 05/19/24   Tat, Asberry RAMAN, DO  clobetasol cream (TEMOVATE) 0.05 % Apply topically. 05/12/24   [provider]  diltiazem  (TIAZAC ) 360 MG 24 hr capsule TAKE 1 CAPSULE BY MOUTH DAILY 05/19/24   Pietro Redell RAMAN, MD  feeding supplement (ENSURE SURGERY) LIQD Take 237 mLs by mouth 2 (two) times daily between meals. 05/26/24   Pokhrel, Laxman, MD  finasteride  (PROSCAR ) 5 MG  tablet Take 5 mg by mouth daily in the afternoon. 07/06/23   [provider]  Magnesium  400 MG TABS Take 400 mg by mouth daily in the afternoon.    [provider]  methocarbamol  (ROBAXIN ) 500 MG tablet Take 1 tablet (500 mg total) by mouth every 6 (six) hours as needed for muscle spasms. 05/26/24   Pokhrel, Laxman, MD  mirabegron  ER (MYRBETRIQ ) 50 MG TB24 tablet Take 50 mg by mouth daily at 4 PM. 04/16/23   [provider]  Multiple Vitamins-Minerals (CENTRUM SILVER 50+MEN) TABS Take 1 tablet by mouth daily in the afternoon.    [provider]  niacin  500 MG tablet Take 500 mg by mouth 2 (two) times daily with a meal.    [provider]  oxyCODONE  (OXY IR/ROXICODONE ) 5 MG immediate release tablet Take 1 tablet (5 mg total) by mouth every 6 (six) hours as needed for moderate pain (pain score 4-6) or severe pain (pain score 7-10). 05/26/24   Pokhrel, Laxman, MD  perindopril (ACEON) 8 MG tablet Take 8 mg by mouth in the morning. 12/30/17   [provider]  Polyethyl Glyc-Propyl Glyc PF (SYSTANE HYDRATION PF) 0.4-0.3 % SOLN Place 1 drop into both eyes daily as needed (dry eyes; eye irritations).    [provider]  polyethylene glycol (MIRALAX  / GLYCOLAX ) 17 g packet Take 17 g by mouth daily as needed for moderate constipation.    [provider]  tamsulosin  (  FLOMAX ) 0.4 MG CAPS capsule Take 1 capsule (0.4 mg total) by mouth daily. Patient taking differently: Take 0.4 mg by mouth daily after supper. 12/22/16   Mackuen, Courteney Lyn, MD    Allergies: Hydrochlorothiazide, Codeine, Doxycycline, Metronidazole, and Mysoline [primidone]    Review of Systems  Psychiatric/Behavioral:  Positive for confusion.     Updated Vital Signs BP (!) 159/71   Pulse 80   Temp 98.1 F (36.7 C) (Oral)   Resp (!) 23   Ht 5' 9 (1.753 m)   Wt 73.5 kg   SpO2 100%   BMI 23.92 kg/m   Physical Exam Vitals and nursing note reviewed.   Constitutional:      General: He is not in acute distress.    Appearance: Normal appearance.  HENT:     Head: Normocephalic and atraumatic.  Eyes:     Conjunctiva/sclera: Conjunctivae normal.  Cardiovascular:     Rate and Rhythm: Normal rate.     Pulses:          Radial pulses are 2+ on the right side and 2+ on the left side.  Pulmonary:     Effort: Pulmonary effort is normal. No respiratory distress.  Chest:     Chest wall: No tenderness.  Abdominal:     Tenderness: There is no abdominal tenderness.  Musculoskeletal:     Comments: Significant healing ecchymosis from left shoulder down into left hand.  Deformity to left humerus. Staples to left thigh are intact with no surrounding erythema, warmth, nor fluctuance   Skin:    Coloration: Skin is not jaundiced or pale.  Neurological:     Mental Status: He is alert and oriented to person, place, and time.     GCS: GCS eye subscore is 4. GCS verbal subscore is 5. GCS motor subscore is 6.     Comments: Sensation 2/2 of BUE and BLE. Tangential speech.     (all labs ordered are listed, but only abnormal results are displayed) Labs Reviewed  COMPREHENSIVE METABOLIC PANEL WITH GFR  CBC  URINALYSIS, ROUTINE W REFLEX MICROSCOPIC  CBG MONITORING, ED    EKG: EKG Interpretation Date/Time:  Wednesday May 27 2024 21:19:21 EST Ventricular Rate:  79 PR Interval:    QRS Duration:  100 QT Interval:  370 QTC Calculation: 425 R Axis:   -31  Text Interpretation: Sinus rhythm Left axis deviation Confirmed by Randol Simmonds [phone]) on 05/27/2024 9:23:39 PM  Radiology: No results found.  {Document cardiac monitor, telemetry assessment procedure when appropriate:32947} Procedures   Medications Ordered in the ED - No data to display    {Click here for ABCD2, HEART and other calculators REFRESH Note before signing:1}                              Medical Decision Making Amount and/or Complexity of Data Reviewed Labs:  ordered. Radiology: ordered.   Patient presents to the ED for concern of AMS, this involves an extensive number of treatment options, and is a complaint that carries with it a high risk of complications and morbidity.  The differential diagnosis includes encephalopathy, UTI, CVA/TIA, ICH, dehydration, sepsis, electrolyte abnormality, hypoglycemia, polypharmacy.   Co morbidities that complicate the patient evaluation  On AC, parkingsons See HPI   Additional history obtained:  Additional history obtained from Family, Nursing, Outside Medical Records, and Past Admission   External records from outside source obtained and reviewed including wife at bedside, triage RN  note, recent mission from 05/23/2024   Lab Tests:  I Ordered, and personally interpreted labs.  The pertinent results include:   WBC 11.3 Lactic WNL Hgb 8.6   Imaging Studies ordered:  I ordered imaging studies including CT head I independently visualized and interpreted imaging which showed  No acute intracranial abnormality. Periventricular white matter decreased attenuation consistent with small vessel ischemic changes. Prominent ventricles, sulci, and cisterns consistent with age-related involutional changes. Remote infarct involving the right globus pallidus I agree with the radiologist interpretation   Cardiac Monitoring:  The patient was maintained on a cardiac monitor.  I personally viewed and interpreted the cardiac monitored which showed an underlying rhythm of: NSR with no ischemic changes    Consultations Obtained:  I requested consultation with the ***,  and discussed lab and imaging findings as well as pertinent plan - they recommend: ***   Problem List / ED Course:  AMS VS hemodynamically stable. No fever nor tachycardia while in ED. No fevers reported from rehab facility Tangential speech. A&Ox3. Able to follow directions appropriately Labs notable for mild leukocytosis without lactic  acidosis. Hgb 8.6 improved from 7.9 yesterday Does not appear septic nor have systemic infection. Suture repair from femur percutaneous pinning does not appear infected. BC pending Will obtain UA via in and out cath. Patient uses depends and it incontinent at baseline Will also obtain ammonia  CT head wo subdural hematoma nor acute abnormalities   Reevaluation:  After the interventions noted above, I reevaluated the patient and found that they have :stayed the same     Dispostion:  After consideration of the diagnostic results and the patients response to treatment, I feel that the patent would benefit from ***.    {Document critical care time when appropriate  Document review of labs and clinical decision tools ie CHADS2VASC2, etc  Document your independent review of radiology images and any outside records  Document your discussion with family members, caretakers and with consultants  Document social determinants of health affecting pt's care  Document your decision making why or why not admission, treatments were needed:32947:::1}   Final diagnoses:  None    ED Discharge Orders     None

## 2024-05-28 ENCOUNTER — Encounter (HOSPITAL_COMMUNITY): Payer: Self-pay | Admitting: Internal Medicine

## 2024-05-28 ENCOUNTER — Emergency Department (HOSPITAL_COMMUNITY)

## 2024-05-28 ENCOUNTER — Observation Stay (HOSPITAL_COMMUNITY)

## 2024-05-28 DIAGNOSIS — I480 Paroxysmal atrial fibrillation: Secondary | ICD-10-CM | POA: Diagnosis not present

## 2024-05-28 DIAGNOSIS — G20A1 Parkinson's disease without dyskinesia, without mention of fluctuations: Secondary | ICD-10-CM

## 2024-05-28 DIAGNOSIS — I10 Essential (primary) hypertension: Secondary | ICD-10-CM

## 2024-05-28 DIAGNOSIS — D649 Anemia, unspecified: Secondary | ICD-10-CM | POA: Insufficient documentation

## 2024-05-28 DIAGNOSIS — R410 Disorientation, unspecified: Secondary | ICD-10-CM

## 2024-05-28 LAB — IRON AND TIBC
Iron: 33 ug/dL — ABNORMAL LOW (ref 45–182)
Saturation Ratios: 14 % — ABNORMAL LOW (ref 17.9–39.5)
TIBC: 241 ug/dL — ABNORMAL LOW (ref 250–450)
UIBC: 208 ug/dL

## 2024-05-28 LAB — TSH: TSH: 1.43 u[IU]/mL (ref 0.350–4.500)

## 2024-05-28 LAB — COMPREHENSIVE METABOLIC PANEL WITH GFR
ALT: 5 U/L (ref 0–44)
AST: 27 U/L (ref 15–41)
Albumin: 3.4 g/dL — ABNORMAL LOW (ref 3.5–5.0)
Alkaline Phosphatase: 67 U/L (ref 38–126)
Anion gap: 10 (ref 5–15)
BUN: 24 mg/dL — ABNORMAL HIGH (ref 8–23)
CO2: 24 mmol/L (ref 22–32)
Calcium: 8.7 mg/dL — ABNORMAL LOW (ref 8.9–10.3)
Chloride: 104 mmol/L (ref 98–111)
Creatinine, Ser: 0.92 mg/dL (ref 0.61–1.24)
GFR, Estimated: >60 mL/min (ref >60)
Glucose, Bld: 148 mg/dL — ABNORMAL HIGH (ref 70–99)
Potassium: 3.9 mmol/L (ref 3.5–5.1)
Sodium: 137 mmol/L (ref 135–145)
Total Bilirubin: 1 mg/dL (ref 0.0–1.2)
Total Protein: 5.9 g/dL — ABNORMAL LOW (ref 6.5–8.1)

## 2024-05-28 LAB — FERRITIN: Ferritin: 221 ng/mL (ref 24–336)

## 2024-05-28 LAB — CBC
HCT: 23.9 % — ABNORMAL LOW (ref 39.0–52.0)
Hemoglobin: 7.7 g/dL — ABNORMAL LOW (ref 13.0–17.0)
MCH: 30.8 pg (ref 26.0–34.0)
MCHC: 32.2 g/dL (ref 30.0–36.0)
MCV: 95.6 fL (ref 80.0–100.0)
Platelets: 244 K/uL (ref 150–400)
RBC: 2.5 MIL/uL — ABNORMAL LOW (ref 4.22–5.81)
RDW: 13 % (ref 11.5–15.5)
WBC: 9.4 K/uL (ref 4.0–10.5)
nRBC: 0 % (ref 0.0–0.2)

## 2024-05-28 LAB — RETICULOCYTES
Immature Retic Fract: 28.4 % — ABNORMAL HIGH (ref 2.3–15.9)
RBC.: 2.49 MIL/uL — ABNORMAL LOW (ref 4.22–5.81)
Retic Count, Absolute: 89.4 K/uL (ref 19.0–186.0)
Retic Ct Pct: 3.6 % — ABNORMAL HIGH (ref 0.4–3.1)

## 2024-05-28 LAB — SYPHILIS: RPR W/REFLEX TO RPR TITER AND TREPONEMAL ANTIBODIES, TRADITIONAL SCREENING AND DIAGNOSIS ALGORITHM: RPR Ser Ql: NONREACTIVE

## 2024-05-28 LAB — VITAMIN B12: Vitamin B-12: 819 pg/mL (ref 180–914)

## 2024-05-28 LAB — FOLATE: Folate: >20 ng/mL (ref >5.9)

## 2024-05-28 MED ORDER — MIRABEGRON ER 50 MG PO TB24
50.0000 mg | ORAL_TABLET | Freq: Every day | ORAL | Status: DC
  Administered 2024-05-29 – 2024-05-31 (×3): 50 mg via ORAL
  Filled 2024-05-28 (×5): qty 1

## 2024-05-28 MED ORDER — TRANDOLAPRIL 1 MG PO TABS
4.0000 mg | ORAL_TABLET | Freq: Every day | ORAL | Status: DC
  Administered 2024-05-28 – 2024-06-01 (×5): 4 mg via ORAL
  Filled 2024-05-28 (×5): qty 4

## 2024-05-28 MED ORDER — APIXABAN 5 MG PO TABS
5.0000 mg | ORAL_TABLET | Freq: Two times a day (BID) | ORAL | Status: DC
  Administered 2024-05-28 – 2024-05-29 (×5): 5 mg via ORAL
  Filled 2024-05-28 (×6): qty 1

## 2024-05-28 MED ORDER — CARBIDOPA-LEVODOPA 25-100 MG PO TABS: 1.0000 | ORAL_TABLET | Freq: Four times a day (QID) | ORAL | Status: DC

## 2024-05-28 MED ORDER — BISACODYL 5 MG PO TBEC: 5.0000 mg | DELAYED_RELEASE_TABLET | Freq: Every day | ORAL | Status: DC | PRN

## 2024-05-28 MED ORDER — CARBIDOPA-LEVODOPA 25-100 MG PO TABS
1.0000 | ORAL_TABLET | ORAL | Status: DC
  Administered 2024-05-28 – 2024-06-01 (×14): 1 via ORAL
  Filled 2024-05-28 (×14): qty 1

## 2024-05-28 MED ORDER — OXYCODONE HCL 5 MG PO TABS
2.5000 mg | ORAL_TABLET | ORAL | Status: DC | PRN
  Administered 2024-05-29 – 2024-06-01 (×8): 2.5 mg via ORAL
  Filled 2024-05-28 (×9): qty 1

## 2024-05-28 MED ORDER — CARBIDOPA-LEVODOPA 25-100 MG PO TABS
2.0000 | ORAL_TABLET | Freq: Every day | ORAL | Status: DC
  Administered 2024-05-28 – 2024-06-01 (×5): 2 via ORAL
  Filled 2024-05-28 (×5): qty 2

## 2024-05-28 MED ORDER — ACETAMINOPHEN 500 MG PO TABS
1000.0000 mg | ORAL_TABLET | Freq: Three times a day (TID) | ORAL | Status: DC | PRN
  Administered 2024-05-28 – 2024-05-30 (×5): 1000 mg via ORAL
  Filled 2024-05-28 (×5): qty 2

## 2024-05-28 MED ORDER — TAMSULOSIN HCL 0.4 MG PO CAPS
0.4000 mg | ORAL_CAPSULE | Freq: Every day | ORAL | Status: DC
  Administered 2024-05-28 – 2024-06-01 (×5): 0.4 mg via ORAL
  Filled 2024-05-28 (×5): qty 1

## 2024-05-28 MED ORDER — DILTIAZEM HCL ER COATED BEADS 180 MG PO CP24
360.0000 mg | ORAL_CAPSULE | Freq: Every day | ORAL | Status: DC
  Administered 2024-05-28 – 2024-06-01 (×5): 360 mg via ORAL
  Filled 2024-05-28 (×5): qty 2

## 2024-05-28 MED ORDER — MAGNESIUM OXIDE -MG SUPPLEMENT 400 (240 MG) MG PO TABS
400.0000 mg | ORAL_TABLET | Freq: Every day | ORAL | Status: DC
  Administered 2024-05-28 – 2024-06-01 (×5): 400 mg via ORAL
  Filled 2024-05-28 (×5): qty 1

## 2024-05-28 MED ORDER — DILTIAZEM HCL ER BEADS 240 MG PO CP24: 360.0000 mg | ORAL_CAPSULE | Freq: Every day | ORAL | Status: DC

## 2024-05-28 MED ORDER — FINASTERIDE 5 MG PO TABS
5.0000 mg | ORAL_TABLET | Freq: Every day | ORAL | Status: DC
  Administered 2024-05-28 – 2024-06-01 (×5): 5 mg via ORAL
  Filled 2024-05-28 (×5): qty 1

## 2024-05-28 NOTE — H&P (Addendum)
 History and Physical    Michael Odonnell FMW:[id number] DOB: 03/21/1941 DOA: 05/27/2024  Patient coming from: Skilled nursing facility.  Chief Complaint: Increasing confusion.  History obtained from patient's wife.  HPI: Michael Odonnell is a 83 y.o. male with history of atrial fibrillation, Parkinson's disease, hypertension, BPH who was recently admitted to the hospital for hip fracture underwent percutaneous fixation of the femoral neck by orthopedics on 05/23/2024 and the patient's left proximal humerus fracture was managed with a sling was discharged to rehab on 05/26/2024 and per patient's wife he was doing well and has been only taking Tylenol  for pain control and was noticed to have increasing confusion since yesterday afternoon.  Has not had any fever chills nausea vomiting diarrhea or chest pain.  ED Course: In the ER patient appears confused and not following commands.  He was disoriented to time place and person.  Patient was afebrile.  Labs revealed WBC of 11.3 hemoglobin of 8.6 creatinine 0.9 BUN 28 ammonia less than 13.  UA is negative for nitrates and leukocyte esterase CT head and MRI did not show anything acute.  Cultures were obtained admitted for further observation for acute delirium.  Review of Systems: As per HPI, rest all negative.   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 His smoking use included cigarettes. He has never used smokeless tobacco. He reports that he does not drink alcohol and does not use drugs.  Allergies[1]  Family History  Problem Relation Age of Onset   Heart attack Mother 63   Heart disease Sister    Melanoma Sister    Heart attack Brother    Healthy Daughter    Healthy Daughter    Stomach cancer Neg Hx    Colon cancer Neg Hx     Prior to Admission medications  Medication Sig Start Date End Date Taking? Authorizing Provider  acetaminophen  (TYLENOL ) 500 MG tablet Take 1,000 mg by mouth every 6 (six) hours as needed for mild pain or headache.    Yes [provider]  apixaban  (ELIQUIS ) 5 MG TABS tablet TAKE 1 TABLET BY MOUTH TWICE  DAILY 02/13/24  Yes Crenshaw, Redell RAMAN, MD  bisacodyl  (DULCOLAX) 5 MG EC tablet Take 1 tablet (5 mg total) by mouth daily as needed for moderate constipation. 05/26/24  Yes Pokhrel, Laxman, MD  Calcium  Carbonate Antacid (TUMS PO) Take 2 tablets by mouth daily as needed (heartburn).    Yes [provider]  carbidopa -levodopa  (SINEMET  IR) 25-100 MG tablet TAKE 2 TABLETS BY MOUTH AT 6 AM, 1 TABLET AT 10 AM, 1 TABLET AT 2 PM, AND 1 TABLET AT 6 PM 05/19/24  Yes Tat, Asberry RAMAN, DO  clobetasol cream (TEMOVATE) 0.05 % Apply 1 Application topically 2 (  two) times daily. for redness 05/12/24  Yes [provider]  diltiazem  (TIAZAC ) 360 MG 24 hr capsule TAKE 1 CAPSULE BY MOUTH DAILY 05/19/24  Yes Crenshaw, Redell RAMAN, MD  finasteride  (PROSCAR ) 5 MG tablet Take 5 mg by mouth daily in the afternoon. 07/06/23  Yes [provider]  Magnesium  400 MG TABS Take 400 mg by mouth daily in the afternoon.   Yes [provider]  magnesium  hydroxide (MILK OF MAGNESIA) 400 MG/5ML suspension Take 30 mLs by mouth daily as needed for mild constipation.   Yes [provider]  methocarbamol  (ROBAXIN ) 500 MG tablet Take 1 tablet (500 mg total) by mouth every 6 (six) hours as needed  for muscle spasms. 05/26/24  Yes Pokhrel, Laxman, MD  mirabegron  ER (MYRBETRIQ ) 50 MG TB24 tablet Take 50 mg by mouth daily at 4 PM. 04/16/23  Yes [provider]  Multiple Vitamins-Minerals (CENTRUM SILVER 50+MEN) TABS Take 1 tablet by mouth at bedtime.   Yes [provider]  niacin  500 MG tablet Take 500 mg by mouth 2 (two) times daily with a meal.   Yes [provider]  oxyCODONE  (OXY IR/ROXICODONE ) 5 MG immediate release tablet Take 1 tablet (5 mg total) by mouth every 6 (six) hours as needed for moderate pain (pain score 4-6) or severe pain (pain score 7-10). 05/26/24  Yes Pokhrel, Laxman, MD  perindopril (ACEON) 8 MG tablet Take 8 mg by mouth in the morning. 12/30/17  Yes [provider]  Polyethyl Glyc-Propyl Glyc PF (SYSTANE HYDRATION PF) 0.4-0.3 % SOLN Place 1 drop into both eyes daily as needed (dry eyes; eye irritations).   Yes [provider]  polyethylene glycol (MIRALAX  / GLYCOLAX ) 17 g packet Take 17 g by mouth daily as needed for moderate constipation.   Yes [provider]  Sodium Phosphates (ENEMA READY-TO-USE RE) Place 1 Bottle rectally daily as needed (for constipation).   Yes [provider]  tamsulosin  (FLOMAX ) 0.4 MG CAPS capsule Take 1 capsule (0.4 mg total) by mouth daily. Patient taking differently: Take 0.4 mg by mouth in the morning. 12/22/16  Yes Mackuen, Courteney Lyn, MD  feeding supplement (ENSURE SURGERY) LIQD Take 237 mLs by mouth 2 (two) times daily between meals. Patient not taking: Reported on 05/28/2024 05/26/24   Pokhrel, Laxman, MD  Nystatin (GERHARDT'S BUTT CREAM) CREA Apply 1 Application topically 2 (two) times daily. Apply to buttocks Patient not taking: Reported on 05/28/2024    [provider]    Physical Exam: Constitutional: Moderately built and nourished. Vitals:   05/28/24 0027 05/28/24 0028 05/28/24 0030 05/28/24 0250  BP: (!) 147/96  (!) 160/68 (!) 177/61  Pulse: 84  82 87   Resp:  18 15 (!) 21  Temp:      TempSrc:      SpO2: 96%  96% 96%  Weight:      Height:       Eyes: Anicteric no pallor. ENMT: No discharge from the ears eyes nose or mouth. Neck: No mass felt.  No neck rigidity. Respiratory: No rhonchi or crepitations. Cardiovascular: S1-S2 heard. Abdomen: Soft nontender bowel sound present. Musculoskeletal: Left arm is in a sling. Skin: Chronic skin changes.  Sacral pressure injury. Neurologic: Alert awake but disoriented to name place and person.  Moving all extremities.  Tremors. Psychiatric: Patient is disoriented.   Labs on Admission: I have personally reviewed following labs and imaging studies  CBC: Recent Labs  Lab 05/23/24 0102 05/23/24 0103 05/23/24 0352 05/24/24 9648  05/26/24 0345 05/27/24 2114  WBC 14.6*  --  15.9* 10.7* 10.6* 11.3*  HGB 12.6* 12.6* 11.4* 8.8* 7.9* 8.6*  HCT 37.6* 37.0* 34.1* 26.3* 24.0* 25.4*  MCV 94.7  --  93.4 93.3 94.1 93.7  PLT 223  --  230 174 172 233   Basic Metabolic Panel: Recent Labs  Lab 05/23/24 0102 05/23/24 0103 05/23/24 0352 05/24/24 0351 05/26/24 0345 05/27/24 2114  NA 139 140 138 139 136 137  K 4.6 4.4 4.4 4.4 3.9 4.0  CL 104 106 105 105 104 101  CO2 24  --  26 22 27 25   GLUCOSE 129* 130* 147* 138* 129* 131*  BUN 29* 35* 31* 31* 28* 28*  CREATININE 1.16 1.30* 1.17 1.30* 0.97 0.98  CALCIUM  9.0  --  9.1 8.4* 8.2* 9.3   GFR: Estimated Creatinine Clearance: 55.1 mL/min (by C-G formula based on SCr of 0.98 mg/dL). Liver Function Tests: Recent Labs  Lab 05/23/24 0102 05/27/24 2114  AST 25 29  ALT 8 <5  ALKPHOS 74 72  BILITOT 0.7 1.0  PROT 6.7 6.3*  ALBUMIN  3.9 3.6   No results for input(s): LIPASE, AMYLASE in the last 168 hours. Recent Labs  Lab 05/27/24 2145  AMMONIA <13   Coagulation Profile: Recent Labs  Lab 05/23/24 0102  INR 1.2   Cardiac Enzymes: No results for input(s): CKTOTAL, CKMB, CKMBINDEX, TROPONINI in the last 168 hours. BNP (last 3  results) No results for input(s): PROBNP in the last 8760 hours. HbA1C: No results for input(s): HGBA1C in the last 72 hours. CBG: Recent Labs  Lab 05/27/24 2147  GLUCAP 134*   Lipid Profile: No results for input(s): CHOL, HDL, LDLCALC, TRIG, CHOLHDL, LDLDIRECT in the last 72 hours. Thyroid  Function Tests: No results for input(s): TSH, T4TOTAL, FREET4, T3FREE, THYROIDAB in the last 72 hours. Anemia Panel: No results for input(s): VITAMINB12, FOLATE, FERRITIN, TIBC, IRON, RETICCTPCT in the last 72 hours. Urine analysis:    Component Value Date/Time   COLORURINE YELLOW 05/27/2024 2321   APPEARANCEUR HAZY (A) 05/27/2024 2321   LABSPEC 1.019 05/27/2024 2321   PHURINE 8.0 05/27/2024 2321   GLUCOSEU NEGATIVE 05/27/2024 2321   HGBUR SMALL (A) 05/27/2024 2321   BILIRUBINUR NEGATIVE 05/27/2024 2321   KETONESUR 5 (A) 05/27/2024 2321   PROTEINUR 30 (A) 05/27/2024 2321   NITRITE NEGATIVE 05/27/2024 2321   LEUKOCYTESUR NEGATIVE 05/27/2024 2321   Sepsis Labs: @LABRCNTIP (procalcitonin:4,lacticidven:4) )No results found for this or any previous visit (from the past 240 hours).   Radiological Exams on Admission: MR BRAIN WO CONTRAST Result Date: 05/28/2024 CLINICAL DATA:  Initial evaluation for acute mental status change. EXAM: MRI HEAD WITHOUT CONTRAST TECHNIQUE: Multiplanar, multiecho pulse sequences of the brain and surrounding structures were obtained without intravenous contrast. COMPARISON:  CT from 05/27/2024. FINDINGS: Brain: Mild age-related cerebral atrophy. Patchy T2/FLAIR hyperintensity involving the periventricular and deep white matter, consistent with chronic small vessel ischemic disease, mild for age. No evidence for acute or subacute infarct. No areas of chronic cortical infarction. No acute or chronic intracranial blood products. No mass lesion, midline shift or mass effect. No hydrocephalus or extra-axial fluid collection. Pituitary gland  within normal limits. Vascular: Major intracranial vascular flow voids are maintained. Mild atheromatous irregularity noted about the intradural left V4 segment. Skull and upper cervical spine: Craniocervical junction within normal limits. Bone marrow signal intensity overall within normal limits. No scalp soft tissue abnormality. Sinuses/Orbits: Globes orbital soft tissues within normal limits. Scattered mucosal thickening about the ethmoidal air  cells. Right-to-left nasal septal deviation with associated concha bullosa. Small right mastoid effusion, of doubtful significance. Other: None. IMPRESSION: 1. No acute intracranial abnormality. 2. Mild age-related cerebral atrophy with chronic small vessel ischemic disease. Electronically Signed   By: Morene Hoard M.D.   On: 05/28/2024 02:00   CT Head Wo Contrast Result Date: 05/27/2024 EXAM: CT HEAD WITHOUT CONTRAST 05/27/2024 10:15:23 PM TECHNIQUE: CT of the head was performed without the administration of intravenous contrast. Automated exposure control, iterative reconstruction, and/or weight based adjustment of the mA/kV was utilized to reduce the radiation dose to as low as reasonably achievable. COMPARISON: 05/23/2024 CLINICAL HISTORY: Head trauma, moderate-severe. FINDINGS: BRAIN AND VENTRICLES: No acute hemorrhage. No evidence of acute infarct. Periventricular white matter decreased attenuation consistent with small vessel ischemic changes. Prominent ventricles, sulci and cisterns consistent with age-related involutional changes. Remote infarct involving the right globus pallidus. No extra-axial collection. No mass effect or midline shift. ORBITS: No acute abnormality. SINUSES: No acute abnormality. SOFT TISSUES AND SKULL: No acute soft tissue abnormality. No skull fracture. IMPRESSION: 1. No acute intracranial abnormality. 2. Periventricular white matter decreased attenuation consistent with small vessel ischemic changes. 3. Prominent ventricles,  sulci, and cisterns consistent with age-related involutional changes. 4. Remote infarct involving the right globus pallidus. Electronically signed by: Dorethia Molt MD 05/27/2024 10:23 PM EST RP Workstation: [id number]    EKG: Independently reviewed.  Normal sinus rhythm.  Assessment/Plan Principal Problem:   Acute delirium Active Problems:   PAF (paroxysmal atrial fibrillation) (HCC)   Essential hypertension   Parkinson's disease (HCC)   BPH (benign prostatic hyperplasia)   Chronic anticoagulation   Closed fracture of neck of left femur (HCC)   Closed fracture of proximal end of left humerus   Pressure injury of skin   Anemia    Acute delirium -    cause not clear.  Patient is afebrile at this time.  CT head and MRI did not show anything acute.  Will continue to monitor closely.  Will check a B12 TSH RPR.  Chest x-ray is pending. Recent left hip fracture status post surgery and also left humerus fracture managed conservatively. Atrial fibrillation on Eliquis  for anticoagulation and Cardizem  for rate control. Hypertension on Cardizem  and ACE inhibitor. Parkinson's disease on Sinemet . Anemia likely postoperative loss.  Follow CBC.  Check anemia panel. History of BPH on finasteride  and tamsulosin . Sacral pressure injury.  Consult wound team.  DVT prophylaxis: Eliquis . Code Status: Full code. Family Communication: Patient's wife. Disposition Plan: Monitored bed. Consults called: None. Admission status: Observation.         [1]  Allergies Allergen Reactions   Hydrochlorothiazide Nausea Only and Palpitations   Codeine Nausea And Vomiting   Doxycycline Nausea Only   Metronidazole Nausea Only   Mysoline [Primidone] Other (See Comments)    Change in balance

## 2024-05-28 NOTE — Progress Notes (Signed)
 Pt from [place]. CSW spoke w/ [facility] admissions and pt can return w/ new auth. Anticipated dc 12/19. CSW started new auth ID #[id number].

## 2024-05-28 NOTE — Care Management Obs Status (Addendum)
 MEDICARE OBSERVATION STATUS NOTIFICATION   Patient Details  Name: Michael Odonnell MRN: [id number] Date of Birth: 08/28/1940   Medicare Observation Status Notification Given:  Yes  Verbally reviewed observation notice with [name] patient daughter telephonically at [phone].  Will leave copy in the patients room.   [name] 05/28/2024, 3:42 PM

## 2024-05-28 NOTE — Evaluation (Signed)
 Physical Therapy Evaluation Patient Details Name: Michael Odonnell MRN: [id number] DOB: [birth year] Today's Date: 05/28/2024  History of Present Illness  83 y.o. male presents to [facility] 12/17 due to altered mental status. Pt recently discharged after hospitalization on 12/13 after a fall with L femoral neck fx s/p percutaneous pinning 12/13 and L proximal humerus fx treated non-op. PMHx:  Parkinson disease, atrial fibrillation on Eliquis , hypertension, and arthritis  Clinical Impression  Currently pt is presenting at max to max A +2 for bed mobility, Mod A +2 for sit to stand from elevated height; unable to progress gait. Pt with posterior lean in standing and very kyphotic posture with flexed cervical spine. Spouse present and supportive. Due to pt current functional status, home set up and available assistance at home recommending skilled physical therapy services < 3 hours/day in order to address strength, balance and functional mobility to decrease risk for falls, injury, immobility, skin break down and re-hospitalization.         If plan is discharge home, recommend the following: A lot of help with walking and/or transfers;A lot of help with bathing/dressing/bathroom;Assist for transportation;Help with stairs or ramp for entrance   Can travel by private vehicle   No    Equipment Recommendations Wheelchair (measurements PT);Wheelchair cushion (measurements PT);BSC/3in1;Hospital bed     Functional Status Assessment Patient has had a recent decline in their functional status and demonstrates the ability to make significant improvements in function in a reasonable and predictable amount of time.     Precautions / Restrictions Precautions Precautions: Fall Recall of Precautions/Restrictions: Impaired Precaution/Restrictions Comments: hx parkinson's Required Braces or Orthoses: Sling (LUE) Restrictions Weight Bearing Restrictions Per Provider Order: Yes LUE Weight Bearing Per Provider Order:  Non weight bearing LLE Weight Bearing Per Provider Order: Weight bearing as tolerated      Mobility  Bed Mobility Overal bed mobility: Needs Assistance Bed Mobility: Supine to Sit, Sit to Supine     Supine to sit: Max assist Sit to supine: Max assist, +2 for physical assistance, +2 for safety/equipment   General bed mobility comments: Assist for LLE and trunk; able to assist moving LE to EOB for supine to sitting.    Transfers Overall transfer level: Needs assistance Equipment used: 2 person hand held assist Transfers: Sit to/from Stand Sit to Stand: Mod assist, +2 physical assistance, +2 safety/equipment           General transfer comment: Mod A +2 from stretcher with HHA on the R and assist at trunk on the L with gait belt. initailly bil knees blocked no buckling noted. Posterior lean in standing.    Ambulation/Gait     General Gait Details: unable to progress     Balance Overall balance assessment: Needs assistance, History of Falls Sitting-balance support: Single extremity supported, Feet supported Sitting balance-Leahy Scale: Poor Sitting balance - Comments: reliant on 1UE and CGA/MinA for posterior lean Postural control: Posterior lean Standing balance support: Single extremity supported, During functional activity Standing balance-Leahy Scale: Poor Standing balance comment: Reliant on external support       Pertinent Vitals/Pain Pain Assessment Pain Assessment: Faces Faces Pain Scale: Hurts whole lot Pain Location: L UE>L LE Pain Descriptors / Indicators: Aching, Discomfort, Grimacing Pain Intervention(s): Monitored during session, Limited activity within patient's tolerance, Repositioned    Home Living Family/patient expects to be discharged to:: Skilled nursing facility Living Arrangements: Spouse/significant other Available Help at Discharge: Family;Available 24 hours/day Type of Home: House Home Access: Stairs to enter Entrance Stairs-Rails:  Left;Right Entrance Stairs-Number of Steps: 3   Home Layout: One level Home Equipment: Agricultural Consultant (2 wheels);Cane - quad;Rollator (4 wheels);Shower seat;BSC/3in1;Grab bars - tub/shower;Grab bars - toilet      Prior Function Prior Level of Function : Needs assist;History of Falls (last six months)             Mobility Comments: Normally uses rollato ADLs Comments: assist for UB dressing     Extremity/Trunk Assessment   Upper Extremity Assessment Upper Extremity Assessment: Defer to OT evaluation;Left hand dominant    Lower Extremity Assessment Lower Extremity Assessment: Generalized weakness;LLE deficits/detail LLE Deficits / Details: recent subcutaneous pinning of L femoral neck. LLE: Unable to fully assess due to pain    Cervical / Trunk Assessment Cervical / Trunk Assessment: Kyphotic  Communication   Communication Communication: Impaired Factors Affecting Communication: Difficulty expressing self    Cognition Arousal: Lethargic Behavior During Therapy: Flat affect   PT - Cognitive impairments: Memory, Attention, Initiation, Sequencing, Problem solving, Safety/Judgement, Orientation   Orientation impairments: Situation, Time, Place     PT - Cognition Comments: Pt does not know where he is; thinks he is on a back porch Following commands: Impaired Following commands impaired: Follows one step commands with increased time, Follows one step commands inconsistently     Cueing Cueing Techniques: Verbal cues, Tactile cues, Visual cues     General Comments General comments (skin integrity, edema, etc.): Spouse present and supportive.        Assessment/Plan    PT Assessment Patient needs continued PT services  PT Problem List Decreased strength;Decreased range of motion;Decreased activity tolerance;Decreased balance;Decreased mobility;Decreased cognition;Decreased knowledge of use of DME;Decreased safety awareness;Decreased knowledge of precautions        PT Treatment Interventions DME instruction;Gait training;Stair training;Therapeutic activities;Functional mobility training;Therapeutic exercise;Balance training;Neuromuscular re-education;Patient/family education;Wheelchair mobility training    PT Goals (Current goals can be found in the Care Plan section)  Acute Rehab PT Goals Patient Stated Goal: to improve mobility PT Goal Formulation: With family Time For Goal Achievement: 06/11/24 Potential to Achieve Goals: Good    Frequency Min 2X/week     Co-evaluation PT/OT/SLP Co-Evaluation/Treatment: Yes Reason for Co-Treatment: For patient/therapist safety;To address functional/ADL transfers;Necessary to address cognition/behavior during functional activity PT goals addressed during session: Balance;Mobility/safety with mobility OT goals addressed during session: ADL's and self-care       AM-PAC PT 6 Clicks Mobility  Outcome Measure Help needed turning from your back to your side while in a flat bed without using bedrails?: A Lot Help needed moving from lying on your back to sitting on the side of a flat bed without using bedrails?: A Lot Help needed moving to and from a bed to a chair (including a wheelchair)?: Total Help needed standing up from a chair using your arms (e.g., wheelchair or bedside chair)?: Total Help needed to walk in hospital room?: Total Help needed climbing 3-5 steps with a railing? : Total 6 Click Score: 8    End of Session Equipment Utilized During Treatment: Gait belt;Other (comment) (sling) Activity Tolerance: Patient limited by pain;Patient limited by fatigue Patient left: in bed;with call bell/phone within reach;with family/visitor present Nurse Communication: Mobility status PT Visit Diagnosis: Unsteadiness on feet (R26.81);Other abnormalities of gait and mobility (R26.89);Muscle weakness (generalized) (M62.81);History of falling (Z91.81)    Time: 8896-8872 PT Time Calculation (min) (ACUTE ONLY): 24  min   Charges:   PT Evaluation $PT Eval Low Complexity: 1 Low   PT General Charges $$ ACUTE PT VISIT: 1  Visit         Dorothyann Maier, DPT, CLT  Acute Rehabilitation Services Office: [phone] (Secure chat preferred)   Dorothyann VEAR Maier 05/28/2024, 12:56 PM

## 2024-05-28 NOTE — ED Notes (Signed)
 Pt had recent surgery on LUE and appears bruised and swollen. Radial pulses are present at +2. Pt denies pain in arm at this time.

## 2024-05-28 NOTE — Evaluation (Signed)
 Occupational Therapy Evaluation Patient Details Name: Beckett Odonnell MRN: [id number] DOB: 1941-05-11 Today's Date: 05/28/2024   History of Present Illness   83 y.o. male presents to [facility] 12/17 due to altered mental status. Pt recently discharged after hospitalization on 12/13 after a fall with L femoral neck fx s/p percutaneous pinning 12/13 and L proximal humerus fx treated non-op. PMHx:  Parkinson disease, atrial fibrillation on Eliquis , hypertension, and arthritis     Clinical Impressions Pt typically walks with a rollator and is assisted for UB dressing and all IADLs. Presents with pain, generalized weakness and impaired cognition. Pt requires +2 assist for all mobility and demonstrates poor sitting and standing balance. He is dependent in all ADLs. Pt is L hand dominant with L shoulder fx. Encouraged self feeding with R hand. Patient will benefit from continued inpatient follow up therapy, <3 hours/day.     If plan is discharge home, recommend the following:   Two people to help with walking and/or transfers;Two people to help with bathing/dressing/bathroom;Assistance with feeding;Assist for transportation     Functional Status Assessment   Patient has had a recent decline in their functional status and demonstrates the ability to make significant improvements in function in a reasonable and predictable amount of time.     Equipment Recommendations   Other (comment) (defer)     Recommendations for Other Services         Precautions/Restrictions   Precautions Precautions: Fall Recall of Precautions/Restrictions: Impaired Required Braces or Orthoses: Sling Restrictions Weight Bearing Restrictions Per Provider Order: Yes LUE Weight Bearing Per Provider Order: Non weight bearing LLE Weight Bearing Per Provider Order: Weight bearing as tolerated     Mobility Bed Mobility Overal bed mobility: Needs Assistance Bed Mobility: Supine to Sit, Sit to Supine      Supine to sit: Max assist Sit to supine: Max assist, +2 for physical assistance, +2 for safety/equipment   General bed mobility comments: Assist for LLE and trunk; able to assist moving LE to EOB for supine to sitting.    Transfers Overall transfer level: Needs assistance Equipment used: 2 person hand held assist Transfers: Sit to/from Stand Sit to Stand: Mod assist, +2 physical assistance, +2 safety/equipment           General transfer comment: Mod A +2 from stretcher with HHA on the R and assist at trunk on the L with gait belt. initailly bil knees blocked no buckling noted. Posterior lean in standing.      Balance Overall balance assessment: Needs assistance, History of Falls Sitting-balance support: Single extremity supported, Feet supported Sitting balance-Leahy Scale: Poor   Postural control: Posterior lean Standing balance support: Single extremity supported, During functional activity Standing balance-Leahy Scale: Poor Standing balance comment: Reliant on external support                           ADL either performed or assessed with clinical judgement   ADL                                         General ADL Comments: pt is currently dependent in all ADLs, educated wife to encourage self feeding with non dominant R hand     Vision Baseline Vision/History: 1 Wears glasses Ability to See in Adequate Light: 0 Adequate Patient Visual Report: No change from baseline  Perception Perception: Impaired   Perception-Other Comments: pt stating the tv was on the patio of his wife's parent's home   Praxis         Pertinent Vitals/Pain Pain Assessment Pain Assessment: Faces Faces Pain Scale: Hurts whole lot Pain Location: L UE>L LE Pain Descriptors / Indicators: Aching, Discomfort, Grimacing Pain Intervention(s): Monitored during session, Repositioned     Extremity/Trunk Assessment Upper Extremity Assessment Upper Extremity  Assessment: Left hand dominant;RUE deficits/detail;LUE deficits/detail RUE Deficits / Details: generalized weakness LUE Deficits / Details: L proximal humerus fx, replaced sling for comfort, bruised with mild edema in hand LUE Coordination: decreased fine motor;decreased gross motor   Lower Extremity Assessment Lower Extremity Assessment: Defer to PT evaluation LLE Deficits / Details: recent subcutaneous pinning of L femoral neck. LLE: Unable to fully assess due to pain   Cervical / Trunk Assessment Cervical / Trunk Assessment: Kyphotic;Other exceptions (with forward head)   Communication Communication Communication: Impaired Factors Affecting Communication: Difficulty expressing self;Other (comment) (tangential)   Cognition Arousal: Lethargic Behavior During Therapy: Flat affect Cognition: Cognition impaired   Orientation impairments: Time, Situation Awareness: Intellectual awareness impaired, Online awareness impaired Memory impairment (select all impairments): Short-term memory, Working civil service fast streamer, Copywriter, advertising, Engineer, structural memory Attention impairment (select first level of impairment): Sustained attention Executive functioning impairment (select all impairments): Organization, Problem solving, Reasoning, Sequencing OT - Cognition Comments: per wife, pt is typically intact cognitively                 Following commands: Impaired Following commands impaired: Follows one step commands with increased time, Follows one step commands inconsistently     Cueing  General Comments   Cueing Techniques: Verbal cues;Tactile cues;Visual cues  Spouse present and supportive.   Exercises     Shoulder Instructions      Home Living Family/patient expects to be discharged to:: Skilled nursing facility Living Arrangements: Spouse/significant other Available Help at Discharge: Family;Available 24 hours/day Type of Home: House Home Access: Stairs to  enter [employer] of Steps: 3 Entrance Stairs-Rails: Left;Right Home Layout: One level     Bathroom Shower/Tub: Producer, Television/film/video: Standard     Home Equipment: Agricultural Consultant (2 wheels);Cane - quad;Rollator (4 wheels);Shower seat;BSC/3in1;Grab bars - tub/shower;Grab bars - toilet          Prior Functioning/Environment Prior Level of Function : Needs assist;History of Falls (last six months)             Mobility Comments: Normally uses rollator ADLs Comments: assist for UB dressing and IADLs    OT Problem List: Decreased strength;Decreased range of motion;Decreased activity tolerance;Impaired balance (sitting and/or standing);Decreased cognition;Decreased safety awareness;Decreased knowledge of use of DME or AE;Decreased knowledge of precautions;Impaired UE functional use;Decreased coordination;Pain;Increased edema   OT Treatment/Interventions: Self-care/ADL training;Therapeutic exercise;DME and/or AE instruction;Therapeutic activities;Patient/family education;Balance training;Cognitive remediation/compensation      OT Goals(Current goals can be found in the care plan section)   Acute Rehab OT Goals OT Goal Formulation: With patient/family Time For Goal Achievement: 06/11/24 Potential to Achieve Goals: Fair ADL Goals Pt Will Perform Eating: with min assist;sitting Pt Will Perform Grooming: with min assist;sitting Pt Will Transfer to Toilet: with mod assist;stand pivot transfer;bedside commode Pt/caregiver will Perform Home Exercise Program: Increased ROM;Left upper extremity (AAROM elbow to hand) Additional ADL Goal #1: Pt will complete bed mobility with moderate assistance in preparation for ADLs. Additional ADL Goal #2: Pt will demonstrate fair sitting balance as a precursor to ADLs.   OT Frequency:  Min 2X/week    Co-evaluation PT/OT/SLP Co-Evaluation/Treatment: Yes Reason for Co-Treatment: For patient/therapist safety;To address  functional/ADL transfers;Necessary to address cognition/behavior during functional activity PT goals addressed during session: Balance;Mobility/safety with mobility OT goals addressed during session: ADL's and self-care      AM-PAC OT 6 Clicks Daily Activity     Outcome Measure Help from another person eating meals?: Total Help from another person taking care of personal grooming?: Total Help from another person toileting, which includes using toliet, bedpan, or urinal?: Total Help from another person bathing (including washing, rinsing, drying)?: Total Help from another person to put on and taking off regular upper body clothing?: Total Help from another person to put on and taking off regular lower body clothing?: Total 6 Click Score: 6   End of Session Equipment Utilized During Treatment: Gait belt  Activity Tolerance: Patient tolerated treatment well Patient left: in bed;with call bell/phone within reach;with family/visitor present  OT Visit Diagnosis: Unsteadiness on feet (R26.81);Other abnormalities of gait and mobility (R26.89);Muscle weakness (generalized) (M62.81)                Time: 1115-1130 OT Time Calculation (min): 15 min Charges:  OT General Charges $OT Visit: 1 Visit OT Evaluation $OT Eval Moderate Complexity: 1 Mod  Mliss HERO, OTR/L Acute Rehabilitation Services Office: [phone]   Kennth Mliss Helling 05/28/2024, 1:49 PM

## 2024-05-28 NOTE — ED Provider Notes (Signed)
 Patient signed out to me at shift change.   Patient BIB EMS from [facility] with confusion or delirium.    Wife states that he hasn't been acting right.    At the bedside, patient attempts to hand me something in his empty hand and asks for a bill.  Question delirium secondary to polypharmacy.  Could also consider periprocedural stroke.  Will add on MRI brain.  Appreciate Dr. Franky for admitting.   Vicky Charleston, PA-C 05/28/24 0044    Trine Raynell Moder, MD 05/28/24 [phone]

## 2024-05-28 NOTE — Hospital Course (Signed)
 83 y.o. M with Parkinson's disease, Afib on Eliquis , HTN, and recent hip and humerus fracture who bounced back from SNF with delirium  4 days prior to this admission, patient was admitted with L hip fracture, and left humerus fracture.  Conservative management of left humerus, left femoral neck pinned.  1 day PTA, patient was discharged to [facility], did his intake there and then in the afternoon, but overnight became confused and disoriented, so wife asked that he be sent back to the hospital.

## 2024-05-28 NOTE — Progress Notes (Signed)
° ° °  Patient: Michael Odonnell FMW:[id number] DOB: Oct 29, 1940      Brief hospital course: 83 y.o. M with Parkinson's disease, Afib on Eliquis , HTN, and recent hip and humerus fracture who bounced back from SNF with delirium  4 days prior to this admission, patient was admitted with L hip fracture, and left humerus fracture.  Conservative management of left humerus, left femoral neck pinned.  1 day PTA, patient was discharged to [facility], did his intake there and then in the afternoon, but overnight became confused and disoriented, so wife asked that he be sent back to the hospital.    This is a no charge note, for further details, please see the H&P by my partner, Dr. Franky from earlier today.   Principal Problem:   Acute delirium Active Problems:   PAF (paroxysmal atrial fibrillation) (HCC)   Essential hypertension   Parkinson's disease (HCC)   BPH (benign prostatic hyperplasia)   Chronic anticoagulation   Closed fracture of neck of left femur (HCC)   Closed fracture of proximal end of left humerus   Pressure injury of skin   Anemia    Delirium CT head, MRI brain, TSH, ammonia, RPR, B12 all normal.  Alysis rules out UTI, chest x-ray shows no pneumonia.  Blood cultures pending.  Will follow-up blood cultures, but clinically this just appears to be expected postoperative delirium in an 83 year old after hip fracture.  Exacerbated by missed doses of Sinemet . -Resume Sinemet  - PT/OT - Delirium precautions: blinds open when able and lights on during day, TV off, minimize interruptions at night, glasses/hearing aids, PT/OT, avoiding Beers list medications, strict adherence to sinemet  schedule         Physical Exam: BP (!) 149/69   Pulse 71   Temp 99.1 F (37.3 C)   Resp 13   Ht 5' 9 (1.753 m)   Wt 73.5 kg   SpO2 91%   BMI 23.92 kg/m   Patient seen and examined.     Family Communication: Wife        Author: [name], MD 05/28/2024 11:00  AM

## 2024-05-28 NOTE — ED Notes (Signed)
 Patient transported to MRI. Pts wife went with pt to MRI to be historian.

## 2024-05-28 NOTE — Plan of Care (Signed)

## 2024-05-29 DIAGNOSIS — R410 Disorientation, unspecified: Secondary | ICD-10-CM | POA: Diagnosis not present

## 2024-05-29 NOTE — Progress Notes (Signed)
" °  Progress Note   Patient: Michael Odonnell FMW:[id number] DOB: 1940/08/24 DOA: 05/27/2024     0 DOS: the patient was seen and examined on 05/29/2024 at 8:50 AM and 1:20 PM      Brief hospital course: 83 y.o. M with Parkinson's disease, Afib on Eliquis , HTN, and recent hip and humerus fracture who was readmitted with delirium     Assessment and Plan: Delirium CT head, MRI brain, TSH, ammonia, RPR, B12 all normal.  Urinalysis rules out UTI, chest x-ray without pneumonia.  Blood cultures no growth at 48 hours.  Patient has postoperative delirium after hip fracture in the setting of missed doses of Sinemet .  At baseline he has no cognitive impairment, no memory loss, but at present he is oriented to self only, unable to state where he is, what month it is, what is going on around him.  He is inattentive to conversation and intermittently more or less confused. - Standard delirium precautions -Strict adherence to Sinemet  schedule   Left hip fracture - WBAT - Eliquis  for DVT prophylaxis - Acetaminophen  or oxycodone  for pain  Left humeral fracture -Left arm in sling - Acetaminophen  or oxycodone  for pain - PT/OT  Anemia Hgb down to 7.7.  Expected postop.  Monitor closely  Parkinson's disease -Continue home Sinemet  - Continue mirabegron , finasteride , Flomax   Paroxysmal atrial fibrillation -Continue Eliquis , diltiazem   Hypertension Blood pressure controlled - Continue trandolapril , diltiazem   Pressure injury of skin Present on admission        Subjective: The patient remains confused although slightly more focused than yesterday.  No fever, no headache, no cough, no focal weakness.     Physical Exam: BP 117/60 (BP Location: Right Arm)   Pulse 64   Temp 98.6 F (37 C) (Axillary)   Resp 17   Ht 5' 9 (1.753 m)   Wt 73.5 kg   SpO2 91%   BMI 23.92 kg/m   Elderly adult male, lying in bed, appears tired, mostly staring off into space, occasionally makes eye contact,  appears dazed, psychomotor slowing is obvious Heart rate controlled, no murmurs, no peripheral edema Respiratory rate normal, lungs clear without rales or wheezes Abdomen soft, no tenderness palpation or guarding The left arm is in a sling and is extensively bruised, there is a hemostatic lesion of the left forearm.  No deformities of the legs bilaterally He is inattentive to conversation, psychomotor slowing, severe generalized weakness in all 4 extremities, left arm not tested due to fracture, resting 6 Hz tremor noted, tongue fasciculations noted     Data Reviewed: Basic metabolic panel shows normal electrolytes and renal function CBC down to 7.7    Family Communication: Wife    Disposition: SNF when mentation clears        Author: [name], MD 05/29/2024 2:03 PM  For on call review [email or web address].    "

## 2024-05-29 NOTE — Plan of Care (Signed)

## 2024-05-29 NOTE — TOC Progression Note (Signed)
 Transition of Care [facility]) - Progression Note    Patient Details  Name: Michael Odonnell MRN: [id number] Date of Birth: [birth year]  Transition of Care [facility] CM/SW Contact  Gwenn Julien Norris, [place] Phone Number: 05/29/2024, 9:02 AM  Clinical Narrative: Home and Community/[employer] auth for [place] received: [id number], valid 12/19-12-23. Confirmed with Myrene in admissions they are prepared to admit pt today if medically stable. MD updated.   Julien Gwenn, MSW, LCSW [phone] (coverage)                          Expected Discharge Plan and Services                                               Social Drivers of Health (SDOH) Interventions SDOH Screenings   Food Insecurity: Patient Unable To Answer (05/28/2024)  Housing: Unknown (05/28/2024)  Transportation Needs: Patient Unable To Answer (05/28/2024)  Utilities: Patient Unable To Answer (05/28/2024)  Social Connections: Patient Unable To Answer (05/28/2024)  Recent Concern: Social Connections - Moderately Isolated (05/23/2024)  Tobacco Use: Medium Risk (05/28/2024)    Readmission Risk Interventions     No data to display

## 2024-05-29 NOTE — NC FL2 (Signed)
 "   MEDICAID FL2 LEVEL OF CARE FORM     IDENTIFICATION  Patient Name: Michael Odonnell Birthdate: 01-22-1941 Sex: male Admission Date (Current Location): 05/27/2024  [facility] and [place] Number:  Producer, Television/film/video and Address:  [facility], [address] [place], [place] [address]      Provider Number: [id number]  Attending Physician Name and Address:  Jonel Lonni SQUIBB, *  Relative Name and Phone Number:       Current Level of Care: Hospital Recommended Level of Care: Skilled Nursing Facility Prior Approval Number:    Date Approved/Denied:   PASRR Number: [id number]  Discharge Plan: SNF    Current Diagnoses: Patient Active Problem List   Diagnosis Date Noted   Acute delirium 05/28/2024   Anemia 05/28/2024   Pressure injury of skin 05/24/2024   Closed fracture of neck of left femur (HCC) 05/23/2024   Closed fracture of proximal end of left humerus 05/23/2024   Chronic anticoagulation 11/10/2019   PAF (paroxysmal atrial fibrillation) (HCC) 10/19/2019   Essential hypertension 10/19/2019   Parkinson's disease (HCC) 10/19/2019   BPH (benign prostatic hyperplasia) 10/19/2019   Nausea 11/07/2017    Orientation RESPIRATION BLADDER Height & Weight     Situation, Self, Place  O2 Incontinent Weight: 162 lb (73.5 kg) Height:  5' 9 (175.3 cm)  BEHAVIORAL SYMPTOMS/MOOD NEUROLOGICAL BOWEL NUTRITION STATUS      Incontinent    AMBULATORY STATUS COMMUNICATION OF NEEDS Skin   Extensive Assist Verbally Surgical wounds, PU Stage and Appropriate Care                       Personal Care Assistance Level of Assistance  Bathing, Feeding, Dressing Bathing Assistance: Maximum assistance Feeding assistance: Limited assistance Dressing Assistance: Maximum assistance Total Care Assistance: Maximum assistance   Functional Limitations Info  Sight, Hearing, Speech Sight Info: Adequate Hearing Info: Adequate Speech Info: Adequate     SPECIAL CARE FACTORS FREQUENCY  PT (By licensed PT), OT (By licensed OT)                    Contractures Contractures Info: Not present    Additional Factors Info  Code Status Code Status Info: FULL             Current Medications (05/29/2024):  This is the current hospital active medication list Current Facility-Administered Medications  Medication Dose Route Frequency Provider Last Rate Last Admin   acetaminophen  (TYLENOL ) tablet 1,000 mg  1,000 mg Oral TID PRN Jonel Lonni SQUIBB, MD   1,000 mg at 05/28/24 2159   apixaban  (ELIQUIS ) tablet 5 mg  5 mg Oral BID Kakrakandy, Arshad N, MD   5 mg at 05/29/24 9157   bisacodyl  (DULCOLAX) EC tablet 5 mg  5 mg Oral Daily PRN Kakrakandy, Arshad N, MD       carbidopa -levodopa  (SINEMET  IR) 25-100 MG per tablet immediate release 1 tablet  1 tablet Oral 3 times per day Jonel Lonni SQUIBB, MD   1 tablet at 05/29/24 9157   carbidopa -levodopa  (SINEMET  IR) 25-100 MG per tablet immediate release 2 tablet  2 tablet Oral Q0600 Jonel Lonni SQUIBB, MD   2 tablet at 05/29/24 0553   diltiazem  (CARDIZEM  CD) 24 hr capsule 360 mg  360 mg Oral Daily Danford, Christopher P, MD   360 mg at 05/29/24 0841   finasteride  (PROSCAR ) tablet 5 mg  5 mg Oral Q1500 Kakrakandy, Arshad N, MD   5 mg at  05/29/24 0842   magnesium  oxide (MAG-OX) tablet 400 mg  400 mg Oral Q1500 Franky Redia SAILOR, MD   400 mg at 05/29/24 9157   mirabegron  ER (MYRBETRIQ ) tablet 50 mg  50 mg Oral q1600 Kakrakandy, Arshad N, MD       oxyCODONE  (Oxy IR/ROXICODONE ) immediate release tablet 2.5 mg  2.5 mg Oral Q4H PRN Danford, Lonni SQUIBB, MD       tamsulosin  (FLOMAX ) capsule 0.4 mg  0.4 mg Oral Daily Franky Redia SAILOR, MD   0.4 mg at 05/29/24 9158   trandolapril  (MAVIK ) tablet 4 mg  4 mg Oral Daily Kakrakandy, Arshad N, MD   4 mg at 05/29/24 9157     Discharge Medications: Please see discharge summary for a list of discharge medications.  Relevant Imaging  Results:  Relevant Lab Results:   Additional Information SSN: [id number]  Gwenn Julien [place], [place]     "

## 2024-05-29 NOTE — Plan of Care (Signed)

## 2024-05-30 DIAGNOSIS — F05 Delirium due to known physiological condition: Secondary | ICD-10-CM | POA: Diagnosis present

## 2024-05-30 DIAGNOSIS — Z79899 Other long term (current) drug therapy: Secondary | ICD-10-CM | POA: Diagnosis not present

## 2024-05-30 DIAGNOSIS — S42202D Unspecified fracture of upper end of left humerus, subsequent encounter for fracture with routine healing: Secondary | ICD-10-CM | POA: Diagnosis not present

## 2024-05-30 DIAGNOSIS — Z888 Allergy status to other drugs, medicaments and biological substances status: Secondary | ICD-10-CM | POA: Diagnosis not present

## 2024-05-30 DIAGNOSIS — Z87730 Personal history of (corrected) cleft lip and palate: Secondary | ICD-10-CM | POA: Diagnosis not present

## 2024-05-30 DIAGNOSIS — Z808 Family history of malignant neoplasm of other organs or systems: Secondary | ICD-10-CM | POA: Diagnosis not present

## 2024-05-30 DIAGNOSIS — Z885 Allergy status to narcotic agent status: Secondary | ICD-10-CM | POA: Diagnosis not present

## 2024-05-30 DIAGNOSIS — Z7901 Long term (current) use of anticoagulants: Secondary | ICD-10-CM | POA: Diagnosis not present

## 2024-05-30 DIAGNOSIS — S72002D Fracture of unspecified part of neck of left femur, subsequent encounter for closed fracture with routine healing: Secondary | ICD-10-CM | POA: Diagnosis not present

## 2024-05-30 DIAGNOSIS — R410 Disorientation, unspecified: Secondary | ICD-10-CM | POA: Diagnosis present

## 2024-05-30 DIAGNOSIS — Z86007 Personal history of in-situ neoplasm of skin: Secondary | ICD-10-CM | POA: Diagnosis not present

## 2024-05-30 DIAGNOSIS — Z87891 Personal history of nicotine dependence: Secondary | ICD-10-CM | POA: Diagnosis not present

## 2024-05-30 DIAGNOSIS — G20A1 Parkinson's disease without dyskinesia, without mention of fluctuations: Secondary | ICD-10-CM | POA: Diagnosis present

## 2024-05-30 DIAGNOSIS — T8189XA Other complications of procedures, not elsewhere classified, initial encounter: Secondary | ICD-10-CM | POA: Diagnosis present

## 2024-05-30 DIAGNOSIS — D72829 Elevated white blood cell count, unspecified: Secondary | ICD-10-CM | POA: Diagnosis present

## 2024-05-30 DIAGNOSIS — Z881 Allergy status to other antibiotic agents status: Secondary | ICD-10-CM | POA: Diagnosis not present

## 2024-05-30 DIAGNOSIS — N400 Enlarged prostate without lower urinary tract symptoms: Secondary | ICD-10-CM | POA: Diagnosis present

## 2024-05-30 DIAGNOSIS — Z860100 Personal history of colon polyps, unspecified: Secondary | ICD-10-CM | POA: Diagnosis not present

## 2024-05-30 DIAGNOSIS — I10 Essential (primary) hypertension: Secondary | ICD-10-CM | POA: Diagnosis present

## 2024-05-30 DIAGNOSIS — M1990 Unspecified osteoarthritis, unspecified site: Secondary | ICD-10-CM | POA: Diagnosis present

## 2024-05-30 DIAGNOSIS — D62 Acute posthemorrhagic anemia: Secondary | ICD-10-CM | POA: Diagnosis present

## 2024-05-30 DIAGNOSIS — L89151 Pressure ulcer of sacral region, stage 1: Secondary | ICD-10-CM | POA: Diagnosis present

## 2024-05-30 DIAGNOSIS — Z8249 Family history of ischemic heart disease and other diseases of the circulatory system: Secondary | ICD-10-CM | POA: Diagnosis not present

## 2024-05-30 DIAGNOSIS — I480 Paroxysmal atrial fibrillation: Secondary | ICD-10-CM | POA: Diagnosis present

## 2024-05-30 LAB — CBC
HCT: 23.2 % — ABNORMAL LOW (ref 39.0–52.0)
Hemoglobin: 7.5 g/dL — ABNORMAL LOW (ref 13.0–17.0)
MCH: 30.7 pg (ref 26.0–34.0)
MCHC: 32.3 g/dL (ref 30.0–36.0)
MCV: 95.1 fL (ref 80.0–100.0)
Platelets: 309 K/uL (ref 150–400)
RBC: 2.44 MIL/uL — ABNORMAL LOW (ref 4.22–5.81)
RDW: 13.5 % (ref 11.5–15.5)
WBC: 9.7 K/uL (ref 4.0–10.5)
nRBC: 0 % (ref 0.0–0.2)

## 2024-05-30 MED ORDER — FERROUS SULFATE 325 (65 FE) MG PO TABS
325.0000 mg | ORAL_TABLET | ORAL | Status: DC
  Administered 2024-05-30 – 2024-06-01 (×2): 325 mg via ORAL
  Filled 2024-05-30 (×2): qty 1

## 2024-05-30 NOTE — Progress Notes (Signed)
" °  Progress Note   Patient: Michael Odonnell FMW:[id number] DOB: [birth year] DOA: 05/27/2024     0 DOS: the patient was seen and examined on 05/30/2024 at 9:38AM      Brief hospital course: 83 y.o. M with Parkinson's disease, Afib on Eliquis , HTN, and recent hip and humerus fracture who was readmitted with delirium     Assessment and Plan: Delirium CT head, MRI brain, TSH, ammonia, RPR, B12 all normal.  Urinalysis rules out UTI, chest x-ray without pneumonia.  Blood cultures no growth at 48 hours.  Patient has postoperative delirium after hip fracture in the setting of missed doses of Sinemet .  At baseline he has no cognitive impairment, no memory loss, but at present he is oriented to self only, unable to state where he is, what month it is, what is going on around him.   This is slightly better but he is not back to baseline - Standard delirium precautions - PT/OT - Strict adherence to Sinemet  schedule   Left hip fracture - WBAT - Eliquis  for DVT prophylaxis - Acetaminophen  or oxycodone  for pain  Left humeral fracture -Left arm in sling - Acetaminophen  or oxycodone  for pain - PT/OT  Anemia Hgb trending down to mid 7s.  Lots of bruising on arm, I am worried about a hematoma in the arm or the hip. - Hold Eliquis  - Trend Hgb  Parkinson's disease -Continue home Sinemet  - Continue mirabegron , finasteride , Flomax   Paroxysmal atrial fibrillation Discussed bleeding/anemia risk vs stroke risk with wife.  In shared decision making, decided to hold Eliquis  - Hold Eliquis  -Continue diltiazem    Hypertension Blood pressure controlled - Continue trandolapril , diltiazem   Pressure injury of skin Present on admission        Subjective: The patient remains confused although slightly more focused than yesterday.  No fever, no headache, no cough, no focal weakness.     Physical Exam: BP 139/60 (BP Location: Right Arm)   Pulse 70   Temp 97.6 F (36.4 C) (Oral)   Resp 17    Ht 5' 9 (1.753 m)   Wt 73.5 kg   SpO2 98%   BMI 23.92 kg/m   Elderly adult male, lying in bed, appears tired, mostly staring off into space, occasionally makes eye contact, appears dazed, psychomotor slowing is obvious Heart rate controlled, no murmurs, no peripheral edema Respiratory rate normal, lungs clear without rales or wheezes Abdomen soft, no tenderness palpation or guarding The left arm is in a sling and is extensively bruised, there is a hemostatic lesion of the left forearm.  No deformities of the legs bilaterally He is inattentive to conversation, psychomotor slowing, severe generalized weakness in all 4 extremities, left arm not tested due to fracture, resting 6 Hz tremor noted, tongue fasciculations noted     Data Reviewed: Basic metabolic panel shows normal electrolytes and renal function CBC trending down    Family Communication: Wife    Disposition: SNF when mentation clears, probably a few more days        Author: [name], MD 05/30/2024 5:51 PM  For on call review [email or web address].    "

## 2024-05-30 NOTE — Progress Notes (Signed)
 Occupational Therapy Treatment Patient Details Name: Michael Odonnell MRN: [id number] DOB: 1941/02/05 Today's Date: 05/30/2024   History of present illness 83 y.o. male [facility] to [facility] 12/17 due to altered mental status. Pt recently discharged after hospitalization on 12/13 after a fall with L femoral neck fx s/p percutaneous pinning 12/13 and L proximal humerus fx treated non-op. PMHx:  Parkinson disease, atrial fibrillation on Eliquis , hypertension, and arthritis   OT comments  Patient received in supine with wife present. Patient instructed on bed mobility and max assist to get to EOB. Patient requiring min to CGA for sitting balance due to right lateral leaning. Patient able to perform oral and face hygiene with min assist using RUE. Patient performed 3 stands from EOB with max assist, using RW for RUE support to assist with balance and patient able to take side step towards [facility] on last stand. Patient asking to return to supine with max assist. Patient was provided setup for lunch and patient able to manage utensils and cup. Patient's wife encouraged to allow patient to feed self with RUE.  Patient will benefit from continued inpatient follow up therapy, <3 hours/day.  Acute OT to continue to follow to address established goals to facilitate DC to next venue of care.        If plan is discharge home, recommend the following:  Two people to help with walking and/or transfers;Two people to help with bathing/dressing/bathroom;Assistance with feeding;Assist for transportation   Equipment Recommendations  Other (comment) (defer)    Recommendations for Other Services      Precautions / Restrictions Precautions Precautions: Fall Recall of Precautions/Restrictions: Impaired Precaution/Restrictions Comments: hx parkinson's Required Braces or Orthoses: Sling Restrictions Weight Bearing Restrictions Per Provider Order: Yes LUE Weight Bearing Per Provider Order: Non weight bearing LLE Weight  Bearing Per Provider Order: Weight bearing as tolerated       Mobility Bed Mobility Overal bed mobility: Needs Assistance Bed Mobility: Supine to Sit, Sit to Supine     Supine to sit: Max assist Sit to supine: Max assist   General bed mobility comments: attempted to move BLEs towards EOB and required assistance with trunk and BLE    Transfers Overall transfer level: Needs assistance Equipment used: Rolling walker (2 wheels) Transfers: Sit to/from Stand Sit to Stand: Max assist, From elevated surface           General transfer comment: patient stood x3 from EOB with use of RW for support with RUE and able to take side steps towards HOB on last stand     Balance Overall balance assessment: Needs assistance, History of Falls Sitting-balance support: Single extremity supported, Feet supported Sitting balance-Leahy Scale: Poor Sitting balance - Comments: CGA to min assist with right lateral leaning Postural control: Right lateral lean Standing balance support: Single extremity supported, During functional activity Standing balance-Leahy Scale: Poor Standing balance comment: reliant on RW and therapist for support                           ADL either performed or assessed with clinical judgement   ADL Overall ADL's : Needs assistance/impaired Eating/Feeding: Minimal assistance;Bed level Eating/Feeding Details (indicate cue type and reason): HOB elevated and setup provided with assistance for opening containers Grooming: Minimal assistance;Wash/dry face;Oral care;Sitting Grooming Details (indicate cue type and reason): on EOB  General ADL Comments: encouraged wife to allow patient to feed self    Extremity/Trunk Assessment              Vision       Perception     Praxis     Communication Communication Communication: Impaired Factors Affecting Communication: Difficulty expressing self   Cognition  Arousal: Alert Behavior During Therapy: Flat affect Cognition: Cognition impaired   Orientation impairments: Time, Situation Awareness: Intellectual awareness impaired, Online awareness impaired Memory impairment (select all impairments): Short-term memory, Working civil service fast streamer, Non-declarative long-term memory, Geneticist, Molecular long-term memory Attention impairment (select first level of impairment): Sustained attention Executive functioning impairment (select all impairments): Organization, Problem solving, Reasoning, Sequencing                   Following commands: Impaired Following commands impaired: Follows one step commands with increased time, Follows one step commands inconsistently      Cueing   Cueing Techniques: Verbal cues, Tactile cues, Visual cues  Exercises      Shoulder Instructions       General Comments wife present and supportive    Pertinent Vitals/ Pain       Pain Assessment Pain Assessment: Faces Faces Pain Scale: Hurts even more Pain Location: L UE>L LE Pain Descriptors / Indicators: Aching, Discomfort, Grimacing Pain Intervention(s): Limited activity within patient's tolerance, Monitored during session, Repositioned, Patient requesting pain meds-RN notified  Home Living                                          Prior Functioning/Environment              Frequency  Min 2X/week        Progress Toward Goals  OT Goals(current goals can now be found in the care plan section)  Progress towards OT goals: Progressing toward goals  Acute Rehab OT Goals Patient Stated Goal: to feel better OT Goal Formulation: With patient/family Time For Goal Achievement: 06/11/24 Potential to Achieve Goals: Fair ADL Goals Pt Will Perform Eating: with min assist;sitting Pt Will Perform Grooming: with min assist;sitting Pt Will Perform Upper Body Dressing: with set-up;sitting Pt Will Perform Lower Body Dressing: with min assist;sit to/from  stand Pt Will Transfer to Toilet: with mod assist;stand pivot transfer;bedside commode Pt Will Perform Toileting - Clothing Manipulation and hygiene: with min assist;sit to/from stand;sitting/lateral leans Pt/caregiver will Perform Home Exercise Program: Increased ROM;Left upper extremity (AAROM elbow to hand) Additional ADL Goal #1: Pt will complete bed mobility with moderate assistance in preparation for ADLs. Additional ADL Goal #2: Pt will demonstrate fair sitting balance as a precursor to ADLs.  Plan      Co-evaluation                 AM-PAC OT 6 Clicks Daily Activity     Outcome Measure   Help from another person eating meals?: A Little Help from another person taking care of personal grooming?: A Little Help from another person toileting, which includes using toliet, bedpan, or urinal?: Total Help from another person bathing (including washing, rinsing, drying)?: Total Help from another person to put on and taking off regular upper body clothing?: Total Help from another person to put on and taking off regular lower body clothing?: Total 6 Click Score: 10    End of Session Equipment Utilized During Treatment: Gait belt;Rolling walker (2 wheels)  OT  Visit Diagnosis: Unsteadiness on feet (R26.81);Other abnormalities of gait and mobility (R26.89);Muscle weakness (generalized) (M62.81)   Activity Tolerance Patient tolerated treatment well   Patient Left in bed;with call bell/phone within reach;with bed alarm set;with family/visitor present   Nurse Communication Mobility status;Patient requests pain meds        Time: 8877-8844 OT Time Calculation (min): 33 min  Charges: OT General Charges $OT Visit: 1 Visit OT Treatments $Self Care/Home Management : 8-22 mins $Therapeutic Activity: 8-22 mins  Dick Laine, OTA Acute Rehabilitation Services  Office [phone]   Jeb LITTIE Laine 05/30/2024, 2:09 PM

## 2024-05-31 DIAGNOSIS — R410 Disorientation, unspecified: Secondary | ICD-10-CM | POA: Diagnosis not present

## 2024-05-31 LAB — BASIC METABOLIC PANEL WITH GFR
Anion gap: 9 (ref 5–15)
BUN: 27 mg/dL — ABNORMAL HIGH (ref 8–23)
CO2: 25 mmol/L (ref 22–32)
Calcium: 8.5 mg/dL — ABNORMAL LOW (ref 8.9–10.3)
Chloride: 101 mmol/L (ref 98–111)
Creatinine, Ser: 0.94 mg/dL (ref 0.61–1.24)
GFR, Estimated: >60 mL/min
Glucose, Bld: 122 mg/dL — ABNORMAL HIGH (ref 70–99)
Potassium: 4.2 mmol/L (ref 3.5–5.1)
Sodium: 135 mmol/L (ref 135–145)

## 2024-05-31 LAB — CBC
HCT: 26.3 % — ABNORMAL LOW (ref 39.0–52.0)
Hemoglobin: 8.7 g/dL — ABNORMAL LOW (ref 13.0–17.0)
MCH: 31.4 pg (ref 26.0–34.0)
MCHC: 33.1 g/dL (ref 30.0–36.0)
MCV: 94.9 fL (ref 80.0–100.0)
Platelets: 335 K/uL (ref 150–400)
RBC: 2.77 MIL/uL — ABNORMAL LOW (ref 4.22–5.81)
RDW: 13.8 % (ref 11.5–15.5)
WBC: 12.1 K/uL — ABNORMAL HIGH (ref 4.0–10.5)
nRBC: 0 % (ref 0.0–0.2)

## 2024-05-31 MED ORDER — QUETIAPINE FUMARATE 25 MG PO TABS
25.0000 mg | ORAL_TABLET | ORAL | Status: DC
  Administered 2024-05-31: 25 mg via ORAL
  Filled 2024-05-31: qty 1

## 2024-05-31 NOTE — Plan of Care (Signed)
" °  Problem: Education: Goal: Knowledge of General Education information will improve Description: Including pain rating scale, medication(s)/side effects and non-pharmacologic comfort measures Outcome: Progressing   Problem: Clinical Measurements: Goal: Respiratory complications will improve Outcome: Progressing   Problem: Pain Managment: Goal: General experience of comfort will improve and/or be controlled Outcome: Progressing   Problem: Skin Integrity: Goal: Risk for impaired skin integrity will decrease Outcome: Progressing   Problem: Education: Goal: Knowledge of General Education information will improve Description: Including pain rating scale, medication(s)/side effects and non-pharmacologic comfort measures Outcome: Progressing   Problem: Clinical Measurements: Goal: Respiratory complications will improve Outcome: Progressing   Problem: Pain Managment: Goal: General experience of comfort will improve and/or be controlled Outcome: Progressing   Problem: Skin Integrity: Goal: Risk for impaired skin integrity will decrease Outcome: Progressing   "

## 2024-05-31 NOTE — Progress Notes (Signed)
 " PROGRESS NOTE    Michael Odonnell  FMW:[id number] DOB: 28-Jun-1940 DOA: 05/27/2024 PCP: Nichole Senior, MD   Brief Narrative:  83 y.o. M with Parkinson's disease, Afib on Eliquis , HTN, and recent hip and humerus fracture who was readmitted with delirium.  Assessment & Plan:   Principal Problem:   Acute delirium Active Problems:   PAF (paroxysmal atrial fibrillation) (HCC)   Essential hypertension   Parkinson's disease (HCC)   BPH (benign prostatic hyperplasia)   Chronic anticoagulation   Closed fracture of neck of left femur (HCC)   Closed fracture of proximal end of left humerus   Pressure injury of skin   Anemia  Delirium CT head, MRI brain, TSH, ammonia, RPR, B12 all normal.  UTI ruled out, chest x-ray without pneumonia.  Blood cultures no growth since admission.  Patient has postoperative delirium after hip fracture in the setting of missed doses of Sinemet .  At baseline he has no cognitive impairment, no memory loss.  Currently patient is oriented to self, he thinks he is at the dental office.  He was able to tell me it is December but could not tell me the year.  Per wife, he was much more coherent yesterday but is confused again today.  Continue management for delirium.  I will start him on Seroquel  for tonight.  1. Avoid benzodiazepines, antihistamines, anticholinergics, and minimize opiate use as these may worsen delirium. 2: Assess, prevent and manage pain as lack of treatment can result in delirium.  3: Provide appropriate lighting and clear signage; a clock and calendar should be easily visible to the patient. 4: Monitor environmental factors. Reduce light and noise at night (close shades, turn off lights, turn off TV, ect). Correct any alterations in sleep cycle. 5: Reorient the patient to person, place, time and situation on each encounter.  6: Correct sensory deficits if possible (replace eye glasses, hearing aids, ect). 7: Avoid restraints if able. Severely delirious  patients benefit from constant observation by a sitter.   Left hip fracture - WBAT - Eliquis  for DVT prophylaxis - Acetaminophen  or oxycodone  for pain   Left humeral fracture -Left arm in sling - Acetaminophen  or oxycodone  for pain - PT/OT   Chronic anemia Hemoglobin improved, will hold Eliquis  for 1 more day.   Parkinson's disease -Continue home Sinemet  - Continue mirabegron , finasteride , Flomax    Paroxysmal atrial fibrillation Previous hospitalist discussed bleeding/anemia risk vs stroke risk with wife.  In shared decision making, decided to hold Eliquis  which I will hold for another day.  Continue diltiazem .   Hypertension Blood pressure controlled - Continue trandolapril , diltiazem    Pressure injury of skin Present on admission   DVT prophylaxis: SCD   Code Status: Full Code  Family Communication: Wife present at bedside.  Plan of care discussed with patient in length and he/she verbalized understanding and agreed with it.  Status is: Inpatient Remains inpatient appropriate because: Patient is still delirious.   Estimated body mass index is 23.92 kg/m as calculated from the following:   Height as of this encounter: 5' 9 (1.753 m).   Weight as of this encounter: 73.5 kg.  Wound 05/23/24 1652 Pressure Injury Sacrum Upper Stage 1 -  Intact skin with non-blanchable redness of a localized area usually over a bony prominence. (Active)   Nutritional Assessment: Body mass index is 23.92 kg/m.SABRA Seen by dietician.  I agree with the assessment and plan as outlined below: Nutrition Status:        . Skin Assessment: I  have examined the patient's skin and I agree with the wound assessment as performed by the wound care RN as outlined below: Wound 05/23/24 1652 Pressure Injury Sacrum Upper Stage 1 -  Intact skin with non-blanchable redness of a localized area usually over a bony prominence. (Active)    Consultants:  None  Procedures:  None none  Antimicrobials:   Anti-infectives (From admission, onward)    None         Subjective: Patient seen and examined, wife at the bedside.  Patient alert and oriented to self and month.  Not to the place or the year.  Per wife, he is more confused compared to yesterday.  Objective: Vitals:   05/30/24 2128 05/31/24 0051 05/31/24 0436 05/31/24 0812  BP: (!) 146/59 (!) 155/65 116/68 (!) 119/52  Pulse: 78 77 62 60  Resp:    18  Temp: 98.4 F (36.9 C) 98.3 F (36.8 C) 97.7 F (36.5 C) 98.5 F (36.9 C)  TempSrc:    Oral  SpO2: 97% 98% 95% 99%  Weight:      Height:        Intake/Output Summary (Last 24 hours) at 05/31/2024 1128 Last data filed at 05/31/2024 0438 Gross per 24 hour  Intake --  Output 1350 ml  Net -1350 ml   Filed Weights   05/27/24 2106  Weight: 73.5 kg    Examination:  General exam: Appears calm and comfortable, has swelling in the left arm Respiratory system: Clear to auscultation. Respiratory effort normal. Cardiovascular system: S1 & S2 heard, RRR. No JVD, murmurs, rubs, gallops or clicks. No pedal edema. Gastrointestinal system: Abdomen is nondistended, soft and nontender. No organomegaly or masses felt. Normal bowel sounds heard. Central nervous system: Alert and oriented x 2. No focal neurological deficits. Skin: No rashes, lesions or ulcers  Data Reviewed: I have personally reviewed following labs and imaging studies  CBC: Recent Labs  Lab 05/26/24 0345 05/27/24 2114 05/28/24 0347 05/30/24 0548 05/31/24 0219  WBC 10.6* 11.3* 9.4 9.7 12.1*  HGB 7.9* 8.6* 7.7* 7.5* 8.7*  HCT 24.0* 25.4* 23.9* 23.2* 26.3*  MCV 94.1 93.7 95.6 95.1 94.9  PLT 172 233 244 309 335   Basic Metabolic Panel: Recent Labs  Lab 05/26/24 0345 05/27/24 2114 05/28/24 0347 05/31/24 0219  NA 136 137 137 135  K 3.9 4.0 3.9 4.2  CL 104 101 104 101  CO2 27 25 24 25   GLUCOSE 129* 131* 148* 122*  BUN 28* 28* 24* 27*  CREATININE 0.97 0.98 0.92 0.94  CALCIUM  8.2* 9.3 8.7* 8.5*    GFR: Estimated Creatinine Clearance: 57.5 mL/min (by C-G formula based on SCr of 0.94 mg/dL). Liver Function Tests: Recent Labs  Lab 05/27/24 2114 05/28/24 0347  AST 29 27  ALT <5 5  ALKPHOS 72 67  BILITOT 1.0 1.0  PROT 6.3* 5.9*  ALBUMIN  3.6 3.4*   No results for input(s): LIPASE, AMYLASE in the last 168 hours. Recent Labs  Lab 05/27/24 2145  AMMONIA <13   Coagulation Profile: No results for input(s): INR, PROTIME in the last 168 hours. Cardiac Enzymes: No results for input(s): CKTOTAL, CKMB, CKMBINDEX, TROPONINI in the last 168 hours. BNP (last 3 results) No results for input(s): PROBNP in the last 8760 hours. HbA1C: No results for input(s): HGBA1C in the last 72 hours. CBG: Recent Labs  Lab 05/27/24 2147  GLUCAP 134*   Lipid Profile: No results for input(s): CHOL, HDL, LDLCALC, TRIG, CHOLHDL, LDLDIRECT in the last 72 hours. Thyroid   Function Tests: No results for input(s): TSH, T4TOTAL, FREET4, T3FREE, THYROIDAB in the last 72 hours. Anemia Panel: No results for input(s): VITAMINB12, FOLATE, FERRITIN, TIBC, IRON, RETICCTPCT in the last 72 hours. Sepsis Labs: Recent Labs  Lab 05/27/24 2213  LATICACIDVEN 1.7    Recent Results (from the past 240 hours)  Blood culture (routine x 2)     Status: None (Preliminary result)   Collection Time: 05/27/24  9:33 PM   Specimen: BLOOD RIGHT ARM  Result Value Ref Range Status   Specimen Description BLOOD RIGHT ARM  Final   Special Requests   Final    BOTTLES DRAWN AEROBIC AND ANAEROBIC Blood Culture adequate volume   Culture   Final    NO GROWTH 4 DAYS Performed at [facility], [address], [place], [place] [address]    Report Status PENDING  Incomplete  Blood culture (routine x 2)     Status: None (Preliminary result)   Collection Time: 05/27/24  9:38 PM   Specimen: BLOOD LEFT ARM  Result Value Ref Range Status   Specimen Description BLOOD LEFT ARM   Final   Special Requests   Final    BOTTLES DRAWN AEROBIC AND ANAEROBIC Blood Culture adequate volume   Culture   Final    NO GROWTH 4 DAYS Performed at [facility], [address] [place], [place] [address]    Report Status PENDING  Incomplete     Radiology Studies: No results found.  Scheduled Meds:  carbidopa -levodopa   1 tablet Oral 3 times per day   carbidopa -levodopa   2 tablet Oral Q0600   diltiazem   360 mg Oral Daily   ferrous sulfate   325 mg Oral QODAY   finasteride   5 mg Oral Q1500   magnesium  oxide  400 mg Oral Q1500   mirabegron  ER  50 mg Oral q1600   tamsulosin   0.4 mg Oral Daily   trandolapril   4 mg Oral Daily   Continuous Infusions:   LOS: 1 day   Fredia Skeeter, MD [facility]  05/31/2024, 11:28 AM   *Please note that this is a verbal dictation therefore any spelling or grammatical errors are due to the Dragon Medical One system interpretation.  Please page via Amion and do not message via secure chat for urgent patient care matters. Secure chat can be used for non urgent patient care matters.  How to contact the [facility] Attending or Consulting provider 7A - 7P or covering provider during after hours 7P -7A, for this patient?  Check the care team in [facility] and look for a) attending/consulting [facility] provider listed and b) the [facility] team listed. Page or secure chat 7A-7P. Log into [email or web address] and use [facility] universal password to access. If you do not have the password, please contact the hospital operator. Locate the [facility] provider you are looking for under [facility] and page to a number that you can be directly reached. If you still have difficulty reaching the provider, please page the [facility] (Director on Call) for the Hospitalists listed on amion for assistance.  "

## 2024-05-31 NOTE — Plan of Care (Signed)

## 2024-06-01 DIAGNOSIS — R410 Disorientation, unspecified: Secondary | ICD-10-CM | POA: Diagnosis not present

## 2024-06-01 LAB — CBC WITH DIFFERENTIAL/PLATELET
Abs Immature Granulocytes: 0.15 K/uL — ABNORMAL HIGH (ref 0.00–0.07)
Basophils Absolute: 0 K/uL (ref 0.0–0.1)
Basophils Relative: 0 %
Eosinophils Absolute: 0.5 K/uL (ref 0.0–0.5)
Eosinophils Relative: 6 %
HCT: 24.7 % — ABNORMAL LOW (ref 39.0–52.0)
Hemoglobin: 8.2 g/dL — ABNORMAL LOW (ref 13.0–17.0)
Immature Granulocytes: 2 %
Lymphocytes Relative: 13 %
Lymphs Abs: 1.1 K/uL (ref 0.7–4.0)
MCH: 31.7 pg (ref 26.0–34.0)
MCHC: 33.2 g/dL (ref 30.0–36.0)
MCV: 95.4 fL (ref 80.0–100.0)
Monocytes Absolute: 1.2 K/uL — ABNORMAL HIGH (ref 0.1–1.0)
Monocytes Relative: 13 %
Neutro Abs: 5.7 K/uL (ref 1.7–7.7)
Neutrophils Relative %: 66 %
Platelets: 343 K/uL (ref 150–400)
RBC: 2.59 MIL/uL — ABNORMAL LOW (ref 4.22–5.81)
RDW: 14 % (ref 11.5–15.5)
WBC: 8.7 K/uL (ref 4.0–10.5)
nRBC: 0 % (ref 0.0–0.2)

## 2024-06-01 LAB — BASIC METABOLIC PANEL WITH GFR
Anion gap: 7 (ref 5–15)
BUN: 26 mg/dL — ABNORMAL HIGH (ref 8–23)
CO2: 25 mmol/L (ref 22–32)
Calcium: 8.2 mg/dL — ABNORMAL LOW (ref 8.9–10.3)
Chloride: 103 mmol/L (ref 98–111)
Creatinine, Ser: 0.99 mg/dL (ref 0.61–1.24)
GFR, Estimated: >60 mL/min
Glucose, Bld: 94 mg/dL (ref 70–99)
Potassium: 4.3 mmol/L (ref 3.5–5.1)
Sodium: 135 mmol/L (ref 135–145)

## 2024-06-01 LAB — CULTURE, BLOOD (ROUTINE X 2)
Culture: NO GROWTH
Culture: NO GROWTH
Special Requests: ADEQUATE
Special Requests: ADEQUATE

## 2024-06-01 MED ORDER — OXYCODONE HCL 5 MG PO TABS: 5.0000 mg | ORAL_TABLET | Freq: Four times a day (QID) | ORAL | 0 refills | Status: DC | PRN

## 2024-06-01 NOTE — Plan of Care (Signed)

## 2024-06-01 NOTE — Progress Notes (Signed)
 Occupational Therapy Treatment Patient Details Name: Michael Odonnell MRN: [id number] DOB: 1940/06/18 Today's Date: 06/01/2024   History of present illness 83 y.o. male presents to [facility] 12/17 due to altered mental status. Pt recently discharged after hospitalization on 12/13 after a fall with L femoral neck fx s/p percutaneous pinning 12/13 and L proximal humerus fx treated non-op. PMHx:  Parkinson disease, atrial fibrillation on Eliquis , hypertension, and arthritis   OT comments  Pt in bed upon therapy arrival and agreeable to participate in OT treatment session. Session focused on bed mobility, sitting balance, patient/family education regarding sling management and UE positioning, and sit to stand using stedy. Pt sling on incorrectly upon therapy arrival. This OT provided education to wife and pt on proper positioning while in sling while assisting pt with sling management. Pt able to tolerate sitting EOB to complete exercises for ~10 minutes with SBA. Stedy used to transfer to to [employer] Pillows utilized for proper positioning assist. Patient will benefit from continued inpatient follow up therapy, <3 hours/day.       If plan is discharge home, recommend the following:  Two people to help with walking and/or transfers;Two people to help with bathing/dressing/bathroom;Assistance with feeding;Assist for transportation   Equipment Recommendations  Other (comment) (defer to next level of care)       Precautions / Restrictions Precautions Precautions: Fall Recall of Precautions/Restrictions: Impaired Precaution/Restrictions Comments: hx parkinson's Required Braces or Orthoses: Sling Restrictions Weight Bearing Restrictions Per Provider Order: Yes LUE Weight Bearing Per Provider Order: Non weight bearing LLE Weight Bearing Per Provider Order: Weight bearing as tolerated       Mobility Bed Mobility Overal bed mobility: Needs Assistance Bed Mobility: Supine to Sit     Supine to sit:  Max assist, HOB elevated, Used rails     General bed mobility comments: Pt able to bring BLE close to EOB. Bed pad used to bring hips to EOB. VC to reach with RUE to grasp rail. Assist to bring trunk off mattress.    Transfers Overall transfer level: Needs assistance Equipment used: Ambulation equipment used Transfers: Sit to/from Stand, Bed to chair/wheelchair/BSC Sit to Stand: Via lift equipment, Mod assist, From elevated surface           General transfer comment: Pt provided with VC for sequencing and technique. Pt able to stand at EOB for ~1-2 minutes while OT assisted with toileting hygiene d/t incontinence. Transfer via Lift Equipment: Stedy   Balance Overall balance assessment: Needs assistance, History of Falls Sitting-balance support: Single extremity supported, Feet supported Sitting balance-Leahy Scale: Poor Sitting balance - Comments: Utilized bed rail for support while seated EOB. Postural control: Posterior lean Standing balance support: Single extremity supported, During functional activity Standing balance-Leahy Scale: Poor Standing balance comment: reliant on external support for balance        ADL either performed or assessed with clinical judgement              Communication Communication Communication: Impaired Factors Affecting Communication: Difficulty expressing self   Cognition Arousal: Alert (drowsy during session with periods of alertness) Behavior During Therapy: Flat affect, WFL for tasks assessed/performed Cognition: Cognition impaired   Orientation impairments: Time, Situation Awareness: Intellectual awareness impaired, Online awareness impaired Memory impairment (select all impairments): Short-term memory, Working civil service fast streamer, Non-declarative long-term memory, Engineer, structural memory Attention impairment (select first level of impairment): Sustained attention Executive functioning impairment (select all impairments): Organization,  Problem solving, Reasoning, Sequencing    Following commands: Impaired Following commands impaired: Only follows  one step commands consistently, Follows one step commands with increased time      Cueing   Cueing Techniques: Verbal cues, Gestural cues, Visual cues, Tactile cues  Exercises Other Exercises Other Exercises: seated EOB, shoulder rolls (forward/backwards), scapular elevation, scapular retraction, cervical extension/flexion, cervical rotation (right/left), 10X, 1 set       General Comments Wife present and supportive    Pertinent Vitals/ Pain       Pain Assessment Pain Assessment: No/denies pain Pain Score: 0-No pain         Frequency  Min 2X/week        Progress Toward Goals  OT Goals(current goals can now be found in the care plan section)  Progress towards OT goals: Progressing toward goals      AM-PAC OT 6 Clicks Daily Activity     Outcome Measure   Help from another person eating meals?: A Little Help from another person taking care of personal grooming?: A Little Help from another person toileting, which includes using toliet, bedpan, or urinal?: Total Help from another person bathing (including washing, rinsing, drying)?: Total Help from another person to put on and taking off regular upper body clothing?: Total Help from another person to put on and taking off regular lower body clothing?: Total 6 Click Score: 10    End of Session Equipment Utilized During Treatment: Gait belt;Other (comment) (sling, stedy)  OT Visit Diagnosis: Unsteadiness on feet (R26.81);Other abnormalities of gait and mobility (R26.89);Muscle weakness (generalized) (M62.81)   Activity Tolerance Patient tolerated treatment well   Patient Left in chair;with call bell/phone within reach;with chair alarm set;with family/visitor present   Nurse Communication Mobility status;Need for lift equipment        Time: 1015-1059 OT Time Calculation (min): 44 min  Charges: OT  General Charges $OT Visit: 1 Visit OT Treatments $Self Care/Home Management : 8-22 mins $Therapeutic Activity: 8-22 mins $Therapeutic Exercise: 8-22 mins  Leita Howell, OTR/L,CBIS  Supplemental OT - [facility] and [facility] Secure Chat Preferred    Deigo Alonso, Leita BIRCH 06/01/2024, 11:12 AM

## 2024-06-01 NOTE — Discharge Summary (Signed)
 Physician Discharge Summary  Michael Odonnell FMW:[id number] DOB: [birth year] DOA: 05/27/2024  PCP: Nichole Senior, MD  Admit date: 05/27/2024 Discharge date: 06/01/2024 30 Day Unplanned Readmission Risk Score    Flowsheet Row ED to Hosp-Admission (Current) from 05/27/2024 in [place] [facility]  30 Day Unplanned Readmission Risk Score (%) 21.28 Filed at 06/01/2024 0801    This score is the patient's risk of an unplanned readmission within 30 days of being discharged (0 -100%). The score is based on dignosis, age, lab data, medications, orders, and past utilization.   Low:  0-14.9   Medium: 15-21.9   High: 22-29.9   Extreme: 30 and above          Admitted From: [facility] Disposition: [facility]  Recommendations for Outpatient Follow-up:  Follow up with PCP in 1-2 weeks Please obtain BMP/CBC in one week Please follow up with your PCP on the following pending results: Unresulted Labs (From admission, onward)    None         Home Health: None Equipment/Devices: None  Discharge Condition: Stable CODE STATUS: Full code Diet recommendation:  Diet Order             Diet Heart Room service appropriate? Yes; Fluid consistency: Thin  Diet effective now                   Subjective: Seen and examined, he is now fully alert and oriented, wife at the bedside verified that as well.  She feels comfortable with him discharging to SNF today.  Brief/Interim Summary: 83 y.o. M with Parkinson's disease, Afib on Eliquis , HTN, and recent hip and humerus fracture who was readmitted with delirium.   Delirium CT head, MRI brain, TSH, ammonia, RPR, B12 all normal.  UTI ruled out, chest x-ray without pneumonia.  Blood cultures no growth since admission.  Patient has postoperative delirium after hip fracture in the setting of missed doses of Sinemet .  At baseline he has no cognitive impairment, no memory loss however he typically does not remember the year.  Treated for the  delirium, started on Seroquel  yesterday, patient is much better today, fully alert and oriented, per wife, he is back to baseline and she would like for him to be discharged.  She actually requested transfer [facility] and I discussed with TOC, they had already discussed with the wife about this, informed her that she will need to work with child psychotherapist at [facility] to transition to [facility] and the fact that [facility] does not have any open bed at the moment.  Patient is now back to baseline, discharging in stable condition.  Having said that, patient is going to be at high risk of recurrent delirium due to history of Parkinson's disease.  This was clearly stated to the wife and the fact that patient is at risk of readmission.     Left hip fracture - WBAT - Eliquis  for DVT prophylaxis, to resume tomorrow. - Acetaminophen  or oxycodone  for pain   Left humeral fracture -Left arm in sling - Acetaminophen  or oxycodone  for pain   Chronic anemia Hemoglobin fairly stable, will hold Eliquis  for 1 more day and have recommended to resume tomorrow.   Parkinson's disease -Continue home Sinemet  - Continue mirabegron , finasteride , Flomax    Paroxysmal atrial fibrillation Previous hospitalist discussed bleeding/anemia risk vs stroke risk with wife.  In shared decision making, decided to hold Eliquis  which I will hold for another day and have advised to resume tomorrow.  Resume prior dose of diltiazem .   Hypertension Blood pressure controlled - Continue trandolapril , diltiazem    Pressure injury of skin Present on admission  Discharge plan was discussed with patient and/or family member and they verbalized understanding and agreed with it.  Discharge Diagnoses:  Principal Problem:   Acute delirium Active Problems:   PAF (paroxysmal atrial fibrillation) (HCC)   Essential hypertension   Parkinson's disease (HCC)   BPH (benign prostatic hyperplasia)   Chronic anticoagulation    Closed fracture of neck of left femur (HCC)   Closed fracture of proximal end of left humerus   Pressure injury of skin   Anemia    Discharge Instructions   Allergies as of 06/01/2024       Reactions   Hydrochlorothiazide Nausea Only, Palpitations   Codeine Nausea And Vomiting   Doxycycline Nausea Only   Metronidazole Nausea Only   Mysoline [primidone] Other (See Comments)   Change in balance        Medication List     PAUSE taking these medications    Eliquis  5 MG Tabs tablet Wait to take this until: June 02, 2024 Generic drug: apixaban  TAKE 1 TABLET BY MOUTH TWICE  DAILY       STOP taking these medications    feeding supplement Liqd       TAKE these medications    carbidopa -levodopa  25-100 MG tablet Commonly known as: SINEMET  IR TAKE 2 TABLETS BY MOUTH AT 6 AM, 1 TABLET AT 10 AM, 1 TABLET AT 2 PM, AND 1 TABLET AT 6 PM The timing of this medication is very important.   acetaminophen  500 MG tablet Commonly known as: TYLENOL  Take 1,000 mg by mouth every 6 (six) hours as needed for mild pain or headache.   bisacodyl  5 MG EC tablet Commonly known as: DULCOLAX Take 1 tablet (5 mg total) by mouth daily as needed for moderate constipation.   Centrum Silver 50+Men Tabs Take 1 tablet by mouth at bedtime.   clobetasol cream 0.05 % Commonly known as: TEMOVATE Apply 1 Application topically 2 (two) times daily. for redness   diltiazem  360 MG 24 hr capsule Commonly known as: TIAZAC  TAKE 1 CAPSULE BY MOUTH DAILY   ENEMA READY-TO-USE RE Place 1 Bottle rectally daily as needed (for constipation).   finasteride  5 MG tablet Commonly known as: PROSCAR  Take 5 mg by mouth daily in the afternoon.   Gerhardt's butt cream Crea Apply 1 Application topically 2 (two) times daily. Apply to buttocks   Magnesium  400 MG Tabs Take 400 mg by mouth daily in the afternoon.   magnesium  hydroxide 400 MG/5ML suspension Commonly known as: MILK OF MAGNESIA Take 30 mLs  by mouth daily as needed for mild constipation.   methocarbamol  500 MG tablet Commonly known as: ROBAXIN  Take 1 tablet (500 mg total) by mouth every 6 (six) hours as needed for muscle spasms.   mirabegron  ER 50 MG Tb24 tablet Commonly known as: MYRBETRIQ  Take 50 mg by mouth daily at 4 PM.   niacin  500 MG tablet Commonly known as: VITAMIN B3 Take 500 mg by mouth 2 (two) times daily with a meal.   oxyCODONE  5 MG immediate release tablet Commonly known as: Oxy IR/ROXICODONE  Take 1 tablet (5 mg total) by mouth every 6 (six) hours as needed for moderate pain (pain score 4-6) or severe pain (pain score 7-10).   perindopril 8 MG tablet Commonly known as: ACEON Take 8 mg by mouth in the morning.   polyethylene glycol 17  g packet Commonly known as: MIRALAX  / GLYCOLAX  Take 17 g by mouth daily as needed for moderate constipation.   Systane Hydration PF 0.4-0.3 % Soln Generic drug: Polyethyl Glyc-Propyl Glyc PF Place 1 drop into both eyes daily as needed (dry eyes; eye irritations).   tamsulosin  0.4 MG Caps capsule Commonly known as: Flomax  Take 1 capsule (0.4 mg total) by mouth daily. What changed: when to take this   TUMS PO Take 2 tablets by mouth daily as needed (heartburn).        Contact information for follow-up providers     Nichole Senior, MD Follow up in 1 week(s).   Specialty: Endocrinology Contact information: [address] [place] [address] [phone]              Contact information for after-discharge care     Destination     [facility] [place], [place] .   Service: Skilled Nursing Contact information: [address] [place]  [address] [phone]                    Allergies[1]  Consultations: None   Procedures/Studies: DG Chest Port 1 View Result Date: 05/28/2024 EXAM: 1 VIEW XRAY OF THE CHEST 05/28/2024 05:55:00 AM COMPARISON: 05/23/2024 CLINICAL HISTORY: Shortness of breath FINDINGS: LUNGS AND  PLEURA: Asymmetric elevation of the right hemidiaphragm, unchanged from previous exam. No focal pulmonary opacity. No pleural effusion. No pneumothorax. HEART AND MEDIASTINUM: No acute abnormality of the cardiac and mediastinal silhouettes. BONES AND SOFT TISSUES: Comminuted and displaced left humeral neck fracture. IMPRESSION: 1. Asymmetric elevation of the right hemidiaphragm, unchanged from previous exam. 2. Comminuted and displaced left humeral neck fracture. Electronically signed by: Waddell Calk MD 05/28/2024 06:27 AM EST RP Workstation: [id number]   MR BRAIN WO CONTRAST Result Date: 05/28/2024 CLINICAL DATA:  Initial evaluation for acute mental status change. EXAM: MRI HEAD WITHOUT CONTRAST TECHNIQUE: Multiplanar, multiecho pulse sequences of the brain and surrounding structures were obtained without intravenous contrast. COMPARISON:  CT from 05/27/2024. FINDINGS: Brain: Mild age-related cerebral atrophy. Patchy T2/FLAIR hyperintensity involving the periventricular and deep white matter, consistent with chronic small vessel ischemic disease, mild for age. No evidence for acute or subacute infarct. No areas of chronic cortical infarction. No acute or chronic intracranial blood products. No mass lesion, midline shift or mass effect. No hydrocephalus or extra-axial fluid collection. Pituitary gland within normal limits. Vascular: Major intracranial vascular flow voids are maintained. Mild atheromatous irregularity noted about the intradural left V4 segment. Skull and upper cervical spine: Craniocervical junction within normal limits. Bone marrow signal intensity overall within normal limits. No scalp soft tissue abnormality. Sinuses/Orbits: Globes orbital soft tissues within normal limits. Scattered mucosal thickening about the ethmoidal air cells. Right-to-left nasal septal deviation with associated concha bullosa. Small right mastoid effusion, of doubtful significance. Other: None. IMPRESSION: 1. No acute  intracranial abnormality. 2. Mild age-related cerebral atrophy with chronic small vessel ischemic disease. Electronically Signed   By: Morene Hoard M.D.   On: 05/28/2024 02:00   CT Head Wo Contrast Result Date: 05/27/2024 EXAM: CT HEAD WITHOUT CONTRAST 05/27/2024 10:15:23 PM TECHNIQUE: CT of the head was performed without the administration of intravenous contrast. Automated exposure control, iterative reconstruction, and/or weight based adjustment of the mA/kV was utilized to reduce the radiation dose to as low as reasonably achievable. COMPARISON: 05/23/2024 CLINICAL HISTORY: Head trauma, moderate-severe. FINDINGS: BRAIN AND VENTRICLES: No acute hemorrhage. No evidence of acute infarct. Periventricular white matter decreased attenuation consistent with small vessel  ischemic changes. Prominent ventricles, sulci and cisterns consistent with age-related involutional changes. Remote infarct involving the right globus pallidus. No extra-axial collection. No mass effect or midline shift. ORBITS: No acute abnormality. SINUSES: No acute abnormality. SOFT TISSUES AND SKULL: No acute soft tissue abnormality. No skull fracture. IMPRESSION: 1. No acute intracranial abnormality. 2. Periventricular white matter decreased attenuation consistent with small vessel ischemic changes. 3. Prominent ventricles, sulci, and cisterns consistent with age-related involutional changes. 4. Remote infarct involving the right globus pallidus. Electronically signed by: Dorethia Molt MD 05/27/2024 10:23 PM EST RP Workstation: [id number]   DG HIP UNILAT WITH PELVIS 2-3 VIEWS LEFT Result Date: 05/23/2024 CLINICAL DATA:  [id number] Surgery, elective [id number] EXAM: DG HIP (WITH OR WITHOUT PELVIS) 2-3V LEFT COMPARISON:  May 23, 2024 FINDINGS: Spot fluoroscopy images were obtained for surgical planning purposes. This demonstrates placement of femoral neck screws through a femoral neck fracture. Fracture fragments are in similar  alignment. Time: 92.2 seconds Dose: 24.43 mGy Please reference procedure report for further details. IMPRESSION: Spot fluoroscopy images obtained for surgical planning purposes. Electronically Signed   By: Corean Salter M.D.   On: 05/23/2024 18:14   DG C-Arm 1-60 Min-No Report Result Date: 05/23/2024 Fluoroscopy was utilized by the requesting physician.  No radiographic interpretation.   DG C-Arm 1-60 Min-No Report Result Date: 05/23/2024 Fluoroscopy was utilized by the requesting physician.  No radiographic interpretation.   DG C-Arm 1-60 Min-No Report Result Date: 05/23/2024 Fluoroscopy was utilized by the requesting physician.  No radiographic interpretation.   CT HIP LEFT WO CONTRAST Result Date: 05/23/2024 EXAM: CT OF THE LEFT HIP WITHOUT IV CONTRAST 05/23/2024 07:03:33 AM TECHNIQUE: CT of the left hip was performed without the administration of intravenous contrast. Multiplanar reformatted images are provided for review. Automated exposure control, iterative reconstruction, and/or weight based adjustment of the mA/kV was utilized to reduce the radiation dose to as low as reasonably achievable. COMPARISON: CTA abdomen and pelvis 05/06/2024. CLINICAL HISTORY: Hip surgical planning. FINDINGS: BONES: Osteopenia. There is an acute transverse oblique mid-cervical proximal left femoral fracture with impaction at the fracture site and about 1 cortex width of cephalad translation of the main distal fracture fragment. Spurring is seen at the left SI joint and symphysis. No destructive bone lesion is seen. There are 2 small sclerotic foci in the right pubic bone, 2 in the sacrum, probable bone islands but metastases are in the differential. No aggressive appearing osseous abnormality or periostitis. There is no further evidence of fractures. SOFT TISSUE: No significant soft tissue edema or fluid collections. No soft tissue mass. No hematoma is seen around the fracture bed. JOINT: Partial joint space  loss at the hip with acetabular osteophytes, consistent with osteoarthritis. No erosive arthropathy. No significant left hip joint effusion. INTRAPELVIC CONTENTS: Sigmoid diverticulosis and moderate fecal retention. 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 There are no overt findings of hernia or incarceration. No dilated bowel is seen in the visualized portion of the pelvis. There is iliofemoral calcific arteriosclerosis. No pelvic mass, hemorrhage, or free fluid. Multiple pelvic phleboliths. . Enlarged prostate again noted with dystrophic calcifications. The bladder is catheterized and contracted. Both testicles are in the scrotal sac. IMPRESSION: 1. Acute transverse oblique mid-cervical proximal left femoral fracture with impaction and approximately one cortex width cephalad translation of the distal fragment. No peri-fracture hematoma identified. 2. Partial hip joint space  loss with acetabular osteophytes, consistent with osteoarthritis. No erosive arthropathy. 3. Bilateral inguinal hernias containing bowel, seen previously. 4. Prostatomegaly. Electronically signed by: Francis Quam MD 05/23/2024 07:39 AM EST RP Workstation: [id number]   CT Shoulder Left Wo Contrast Result Date: 05/23/2024 EXAM: CT LEFT SHOULDER, WITHOUT IV CONTRAST 05/23/2024 01:37:40 AM TECHNIQUE: Axial images were acquired through the left shoulder without IV contrast. Reformatted images were reviewed. Automated exposure control, iterative reconstruction, and/or weight based adjustment of the mA/kV was utilized to reduce the radiation dose to as low as reasonably achievable. COMPARISON: Plain film from earlier in the same day. CLINICAL HISTORY: Clinical history known left proximal humeral fracture FINDINGS: BONES: There is again noted a comminuted fracture of the proximal left humerus involving  primarily the surgical neck. Impaction at the fracture site is noted. No other fractures are noted. JOINTS: The humeral head is well seated within the glenoid. No significant joint effusion is noted. SOFT TISSUES: No soft tissue hematoma is noted. No other focal abnormality is seen. IMPRESSION: 1. Comminuted fracture of the proximal left humerus involving primarily the surgical neck with impaction at the fracture site. 2. The humeral head remains well seated within the glenoid. 3. No additional fractures identified. Electronically signed by: Oneil Devonshire MD 05/23/2024 01:46 AM EST RP Workstation: [id number]   CT CERVICAL SPINE WO CONTRAST Result Date: 05/23/2024 EXAM: CT CERVICAL SPINE WITHOUT CONTRAST 05/23/2024 01:34:14 AM TECHNIQUE: CT of the cervical spine was performed without the administration of intravenous contrast. Multiplanar reformatted images are provided for review. Automated exposure control, iterative reconstruction, and/or weight based adjustment of the mA/kV was utilized to reduce the radiation dose to as low as reasonably achievable. COMPARISON: None available. CLINICAL HISTORY: Polytrauma, blunt. FINDINGS: BONES AND ALIGNMENT: No acute cervical spine fracture. There is a minimally displaced fracture of the anterior superior corner of T2 favored to be acute. No traumatic malalignment. 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 SOFT TISSUES: No prevertebral soft tissue swelling. IMPRESSION: 1. Minimally displaced fracture of the anterior superior corner of T2, favored to be acute. 2. No acute cervical spine fracture. 3. 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 Electronically signed by: Franky Stanford MD 05/23/2024 01:45 AM EST RP Workstation: [id number]   CT HEAD WO CONTRAST Result Date: 05/23/2024 EXAM: CT HEAD WITHOUT 05/23/2024 01:34:14 AM TECHNIQUE: CT of the head was performed  without the administration of intravenous contrast. Automated exposure control, iterative reconstruction, and/or weight based adjustment of the mA/kV was utilized to reduce the radiation dose to as low as reasonably achievable. COMPARISON: None available. CLINICAL HISTORY: Head trauma, moderate-severe FINDINGS: BRAIN AND VENTRICLES: No acute intracranial hemorrhage. No mass effect or midline shift. No extra-axial fluid collection. No evidence of acute infarct. No hydrocephalus. Mild diffuse cerebral volume loss. ORBITS: No acute abnormality. SINUSES AND MASTOIDS: No acute abnormality. SOFT TISSUES AND SKULL: No acute skull fracture. No acute soft tissue abnormality. IMPRESSION: 1. No acute intracranial abnormality. 2. Mild diffuse cerebral volume loss. Electronically signed by: Franky Stanford MD 05/23/2024 01:40 AM EST RP Workstation: [id number]   DG Shoulder Left Result Date: 05/23/2024 EXAM: 3 VIEW(S) XRAY OF THE LEFT SHOULDER 05/23/2024 01:23:49 AM COMPARISON: None available. CLINICAL HISTORY: Trauma FINDINGS: BONES AND JOINTS: Comminuted fracture of left humeral head and neck with approximately 1 shaft width anterior displacement of distal fracture fragment. The [facility] joint is unremarkable. SOFT TISSUES: No abnormal calcifications. Visualized lung is unremarkable. IMPRESSION: 1. Comminuted left humeral head/neck fracture, as above.  Electronically signed by: Pinkie Pebbles MD 05/23/2024 01:29 AM EST RP Workstation: [id number]   DG Pelvis Portable Result Date: 05/23/2024 EXAM: 1 VIEW(S) XRAY OF THE PELVIS 05/23/2024 01:23:49 AM COMPARISON: None available. CLINICAL HISTORY: Trauma FINDINGS: BONES AND JOINTS: Subcapital left femoral neck fracture. Mild bilateral hip osteoarthritis. SOFT TISSUES: The soft tissues are unremarkable. IMPRESSION: 1. Subcapital left femoral neck fracture. Electronically signed by: Pinkie Pebbles MD 05/23/2024 01:28 AM EST RP Workstation: [id number]   DG Chest Port 1 View Result  Date: 05/23/2024 EXAM: 1 VIEW(S) XRAY OF THE CHEST 05/23/2024 01:23:49 AM COMPARISON: 10/30/2019 CLINICAL HISTORY: Trauma FINDINGS: LUNGS AND PLEURA: No focal pulmonary opacity. No pleural effusion. No pneumothorax. HEART AND MEDIASTINUM: No acute abnormality of the cardiac and mediastinal silhouettes. BONES AND SOFT TISSUES: No acute osseous abnormality. IMPRESSION: 1. No acute cardiopulmonary process. Electronically signed by: Pinkie Pebbles MD 05/23/2024 01:27 AM EST RP Workstation: [id number]   CT ANGIO GI BLEED Result Date: 05/06/2024 EXAM: CTA ABDOMEN AND PELVIS WITH CONTRAST 05/06/2024 03:05:18 AM TECHNIQUE: CTA images of the abdomen and pelvis with intravenous contrast. Three-dimensional MIP/volume rendered formations were performed. Automated exposure control, iterative reconstruction, and/or weight based adjustment of the mA/kV was utilized to reduce the radiation dose to as low as reasonably achievable. COMPARISON: 04/23/2023 CLINICAL HISTORY: Assess for GI bleed, BRBPR. FINDINGS: VASCULATURE: GI BLEED: No active extravasation of contrast to localize GI bleed. AORTA: Diffuse aortic atherosclerosis. No acute finding. No abdominal aortic aneurysm. No dissection. CELIAC TRUNK: No acute finding. No occlusion or significant stenosis. SUPERIOR MESENTERIC ARTERY: No acute finding. No occlusion or significant stenosis. INFERIOR MESENTERIC ARTERY: No acute finding. No occlusion or significant stenosis. RENAL ARTERIES: No acute finding. No occlusion or significant stenosis. ILIAC ARTERIES: No acute finding. No occlusion or significant stenosis. ABDOMEN/PELVIS: LOWER CHEST: Visualized portion of the lower chest demonstrates no acute abnormality. LIVER: The liver is unremarkable. GALLBLADDER AND BILE DUCTS: Gallbladder is unremarkable. No biliary ductal dilatation. SPLEEN: The spleen is unremarkable. PANCREAS: The pancreas is unremarkable. ADRENAL GLANDS: Low density enlargement of the adrenal glands  bilaterally compatible with hyperplasia, unchanged. KIDNEYS, URETERS AND BLADDER: No stones in the kidneys or ureters. No hydronephrosis. No perinephric or periureteral stranding. Urinary bladder is unremarkable. GI AND BOWEL: Diffuse colonic diverticulosis. Bilateral inguinal hernias, the right containing small bowel loops and the left containing sigmoid colon. No bowel obstruction. Stomach and duodenal sweep demonstrate no acute abnormality. No abnormal bowel wall thickening or distension. REPRODUCTIVE: Prostate enlargement. PERITONEUM AND RETROPERITONEUM: No ascites or free air. LYMPH NODES: No lymphadenopathy. BONES AND SOFT TISSUES: No acute abnormality of the bones. No acute soft tissue abnormality. IMPRESSION: 1. No active GI bleeding. 2. Bilateral inguinal hernias, the right containing small bowel loops and the left containing sigmoid colon, without bowel obstruction. 3. Diffuse colonic diverticulosis. 4. Aortic atherosclerosis. Electronically signed by: Franky Crease MD 05/06/2024 03:13 AM EST RP Workstation: [id number]     Discharge Exam: Vitals:   06/01/24 0731 06/01/24 0817  BP: (!) 117/50 (!) 117/50  Pulse: 65   Resp: 18   Temp:    SpO2: 91%    Vitals:   06/01/24 0023 06/01/24 0445 06/01/24 0731 06/01/24 0817  BP: 106/63 (!) 124/52 (!) 117/50 (!) 117/50  Pulse: 71 65 65   Resp:   18   Temp:      TempSrc:      SpO2: 94% 93% 91%   Weight:      Height:        General: Pt is alert, awake,  not in acute distress Cardiovascular: RRR, S1/S2 +, no rubs, no gallops Respiratory: CTA bilaterally, no wheezing, no rhonchi Abdominal: Soft, NT, ND, bowel sounds + Extremities: no edema, no cyanosis, sling in the left upper extremity.    The results of significant diagnostics from this hospitalization (including imaging, microbiology, ancillary and laboratory) are listed below for reference.     Microbiology: Recent Results (from the past 240 hours)  Blood culture (routine x 2)      Status: None (Preliminary result)   Collection Time: 05/27/24  9:33 PM   Specimen: BLOOD RIGHT ARM  Result Value Ref Range Status   Specimen Description BLOOD RIGHT ARM  Final   Special Requests   Final    BOTTLES DRAWN AEROBIC AND ANAEROBIC Blood Culture adequate volume   Culture   Final    NO GROWTH 4 DAYS Performed at [facility], [address] [place], [place] [address]    Report Status PENDING  Incomplete  Blood culture (routine x 2)     Status: None (Preliminary result)   Collection Time: 05/27/24  9:38 PM   Specimen: BLOOD LEFT ARM  Result Value Ref Range Status   Specimen Description BLOOD LEFT ARM  Final   Special Requests   Final    BOTTLES DRAWN AEROBIC AND ANAEROBIC Blood Culture adequate volume   Culture   Final    NO GROWTH 4 DAYS Performed at [facility], [address], [place], [place] [address]    Report Status PENDING  Incomplete     Labs: BNP (last 3 results) No results for input(s): BNP in the last 8760 hours. Basic Metabolic Panel: Recent Labs  Lab 05/26/24 0345 05/27/24 2114 05/28/24 0347 05/31/24 0219 06/01/24 0309  NA 136 137 137 135 135  K 3.9 4.0 3.9 4.2 4.3  CL 104 101 104 101 103  CO2 27 25 24 25 25   GLUCOSE 129* 131* 148* 122* 94  BUN 28* 28* 24* 27* 26*  CREATININE 0.97 0.98 0.92 0.94 0.99  CALCIUM  8.2* 9.3 8.7* 8.5* 8.2*   Liver Function Tests: Recent Labs  Lab 05/27/24 2114 05/28/24 0347  AST 29 27  ALT <5 5  ALKPHOS 72 67  BILITOT 1.0 1.0  PROT 6.3* 5.9*  ALBUMIN  3.6 3.4*   No results for input(s): LIPASE, AMYLASE in the last 168 hours. Recent Labs  Lab 05/27/24 2145  AMMONIA <13   CBC: Recent Labs  Lab 05/27/24 2114 05/28/24 0347 05/30/24 0548 05/31/24 0219 06/01/24 0309  WBC 11.3* 9.4 9.7 12.1* 8.7  NEUTROABS  --   --   --   --  5.7  HGB 8.6* 7.7* 7.5* 8.7* 8.2*  HCT 25.4* 23.9* 23.2* 26.3* 24.7*  MCV 93.7 95.6 95.1 94.9 95.4  PLT 233 244 309 335 343   Cardiac Enzymes: No results  for input(s): CKTOTAL, CKMB, CKMBINDEX, TROPONINI in the last 168 hours. BNP: Invalid input(s): POCBNP CBG: Recent Labs  Lab 05/27/24 2147  GLUCAP 134*   D-Dimer No results for input(s): DDIMER in the last 72 hours. Hgb A1c No results for input(s): HGBA1C in the last 72 hours. Lipid Profile No results for input(s): CHOL, HDL, LDLCALC, TRIG, CHOLHDL, LDLDIRECT in the last 72 hours. Thyroid  function studies No results for input(s): TSH, T4TOTAL, T3FREE, THYROIDAB in the last 72 hours.  Invalid input(s): FREET3 Anemia work up No results for input(s): VITAMINB12, FOLATE, FERRITIN, TIBC, IRON, RETICCTPCT in the last 72 hours. Urinalysis    Component Value  Date/Time   COLORURINE YELLOW 05/27/2024 2321   APPEARANCEUR HAZY (A) 05/27/2024 2321   LABSPEC 1.019 05/27/2024 2321   PHURINE 8.0 05/27/2024 2321   GLUCOSEU NEGATIVE 05/27/2024 2321   HGBUR SMALL (A) 05/27/2024 2321   BILIRUBINUR NEGATIVE 05/27/2024 2321   KETONESUR 5 (A) 05/27/2024 2321   PROTEINUR 30 (A) 05/27/2024 2321   NITRITE NEGATIVE 05/27/2024 2321   LEUKOCYTESUR NEGATIVE 05/27/2024 2321   Sepsis Labs Recent Labs  Lab 05/28/24 0347 05/30/24 0548 05/31/24 0219 06/01/24 0309  WBC 9.4 9.7 12.1* 8.7   Microbiology Recent Results (from the past 240 hours)  Blood culture (routine x 2)     Status: None (Preliminary result)   Collection Time: 05/27/24  9:33 PM   Specimen: BLOOD RIGHT ARM  Result Value Ref Range Status   Specimen Description BLOOD RIGHT ARM  Final   Special Requests   Final    BOTTLES DRAWN AEROBIC AND ANAEROBIC Blood Culture adequate volume   Culture   Final    NO GROWTH 4 DAYS Performed at [facility], [address], [place], [place] [address]    Report Status PENDING  Incomplete  Blood culture (routine x 2)     Status: None (Preliminary result)   Collection Time: 05/27/24  9:38 PM   Specimen: BLOOD LEFT ARM  Result Value Ref Range  Status   Specimen Description BLOOD LEFT ARM  Final   Special Requests   Final    BOTTLES DRAWN AEROBIC AND ANAEROBIC Blood Culture adequate volume   Culture   Final    NO GROWTH 4 DAYS Performed at [facility], [address] [place], [place] [address]    Report Status PENDING  Incomplete    FURTHER DISCHARGE INSTRUCTIONS:   Get Medicines reviewed and adjusted: Please take all your medications with you for your next visit with your Primary MD   Laboratory/radiological data: Please request your Primary MD to go over all hospital tests and procedure/radiological results at the follow up, please ask your Primary MD to get all Hospital records sent to his/her office.   In some cases, they will be blood work, cultures and biopsy results pending at the time of your discharge. Please request that your primary care M.D. goes through all the records of your hospital data and follows up on these results.   Also Note the following: If you experience worsening of your admission symptoms, develop shortness of breath, life threatening emergency, suicidal or homicidal thoughts you must seek medical attention immediately by calling 911 or calling your MD immediately  if symptoms less severe.   You must read complete instructions/literature along with all the possible adverse reactions/side effects for all the Medicines you take and that have been prescribed to you. Take any new Medicines after you have completely understood and accpet all the possible adverse reactions/side effects.    patient was instructed, not to drive, operate heavy machinery, perform activities at heights, swimming or participation in water activities or provide baby-sitting services while on Pain, Sleep and Anxiety Medications; until their outpatient Physician has advised to do so again. Also recommended to not to take more than prescribed Pain, Sleep and Anxiety Medications.  It is not advisable to combine anxiety, sleep  and pain medications without talking with your primary care provider.     Wear Seat belts while driving.   Please note: You were cared for by a hospitalist during your hospital stay. Once you are discharged, your primary care  physician will handle any further medical issues. Please note that NO REFILLS for any discharge medications will be authorized once you are discharged, as it is imperative that you return to your primary care physician (or establish a relationship with a primary care physician if you do not have one) for your post hospital discharge needs so that they can reassess your need for medications and monitor your lab values  Time coordinating discharge: Over 30 minutes  SIGNED:   Fredia Skeeter, MD  [facility] 06/01/2024, 10:11 AM *Please note that this is a verbal dictation therefore any spelling or grammatical errors are due to the Dragon Medical One system interpretation. If 7PM-7AM, please contact night-coverage [email or web address]     [1]  Allergies Allergen Reactions   Hydrochlorothiazide Nausea Only and Palpitations   Codeine Nausea And Vomiting   Doxycycline Nausea Only   Metronidazole Nausea Only   Mysoline [Primidone] Other (See Comments)    Change in balance

## 2024-06-01 NOTE — Progress Notes (Signed)
 1158 Report given to Nurse at [facility]. Pt is ready for transport for [facility].

## 2024-06-01 NOTE — Plan of Care (Signed)
  Problem: Education: Goal: Knowledge of General Education information will improve Description: Including pain rating scale, medication(s)/side effects and non-pharmacologic comfort measures Outcome: Progressing   Problem: Clinical Measurements: Goal: Respiratory complications will improve Outcome: Progressing Goal: Cardiovascular complication will be avoided Outcome: Progressing   Problem: Nutrition: Goal: Adequate nutrition will be maintained Outcome: Progressing   Problem: Coping: Goal: Level of anxiety will decrease Outcome: Progressing

## 2024-06-01 NOTE — Progress Notes (Signed)
 1129 ATTEMPTED TO CALL REPORT TO [facility] BUT NO ANSWER. UNABLE TO LEAVE VOICEMAIL DUE TO NO MAILBOX AVAILABLE.

## 2024-06-01 NOTE — TOC Transition Note (Signed)
 Transition of Care [facility]) - Discharge Note   Patient Details  Name: Michael Odonnell MRN: [id number] Date of Birth: Dec 25, 1940  [facility]) CM/SW Contact:  Sherline Clack, LCSWA Phone Number: 06/01/2024, 11:41 AM   Clinical Narrative:     Patient will DC to: [facility] Anticipated DC date: 06/01/2024  Family notified: [name]/wife Transport by: [facility]   Per MD patient ready for DC to [facility]. RN to call report prior to discharge [phone], rm [phone]). RN, patient, patient's family, and facility notified of DC. Discharge Summary and FL2 sent to facility. DC packet on chart. Ambulance transport requested for patient.   CSW will sign off for now as social work intervention is no longer needed. Please consult us  again if new needs arise.    Final next level of care: Skilled Nursing Facility Barriers to Discharge: Barriers Resolved   Patient Goals and CMS Choice     Choice offered to / list presented to : Spouse      Discharge Placement              Patient chooses bed at: [facility] Patient to be transferred to facility by: [facility] Name of family member notified: [name]/wife Patient and family notified of of transfer: 06/01/24  Discharge Plan and Services Additional resources added to the After Visit Summary for                                       Social Drivers of Health (SDOH) Interventions SDOH Screenings   Food Insecurity: Patient Unable To Answer (05/28/2024)  Housing: Unknown (05/28/2024)  Transportation Needs: Patient Unable To Answer (05/28/2024)  Utilities: Patient Unable To Answer (05/28/2024)  Social Connections: Patient Unable To Answer (05/28/2024)  Recent Concern: Social Connections - Moderately Isolated (05/23/2024)  Tobacco Use: Medium Risk (05/28/2024)     Readmission Risk Interventions     No data to display

## 2024-06-10 ENCOUNTER — Other Ambulatory Visit

## 2024-06-10 ENCOUNTER — Ambulatory Visit: Admitting: Orthopedic Surgery

## 2024-06-10 DIAGNOSIS — S72002D Fracture of unspecified part of neck of left femur, subsequent encounter for closed fracture with routine healing: Secondary | ICD-10-CM | POA: Diagnosis not present

## 2024-06-10 DIAGNOSIS — S42202D Unspecified fracture of upper end of left humerus, subsequent encounter for fracture with routine healing: Secondary | ICD-10-CM | POA: Diagnosis not present

## 2024-06-10 DIAGNOSIS — S72002A Fracture of unspecified part of neck of left femur, initial encounter for closed fracture: Secondary | ICD-10-CM | POA: Diagnosis not present

## 2024-06-10 DIAGNOSIS — S42202A Unspecified fracture of upper end of left humerus, initial encounter for closed fracture: Secondary | ICD-10-CM

## 2024-06-10 NOTE — Progress Notes (Signed)
 Orthopedic Surgery Post-operative Office Visit  Procedure: Left femoral neck fracture percutaneous pinning Date of Surgery: 05/23/2024 (~2 weeks postop)  Assessment: Patient is a 83 y.o. whose hip pain has been improving with time.  Still having significant shoulder pain   Plan: -Operative plans complete for the left hip.  No operative plans for the proximal humerus fracture at this time -Pain management: Tylenol , limit use of oxycodone  due to drowsiness -NWB LUE in sling, WBAT LLE -Continue to work with PT -Okay to let soap/water run over the incision but do not submerge -Staples removed today in office -Return to office in 4 weeks, x-rays needed at next visit: AP/lateral left hip, Grashey/scapular Y left shoulder  ___________________________________________________________________________   Subjective: Patient has noted improvement in his hip pain since he was discharged from the hospital.  He is at a skilled nursing facility.  He has not noticed much pain in the hip but he has not been doing much walking.  He has been taking a few steps but that is about it.  He is still having pain in the shoulder with movement or when he gets moved in bed as a part of his care.  His wife notes that he has been a little more drowsy lately at the skilled nursing facility.  Objective:  General: no acute distress, appropriate affect Neurologic: alert, answering questions appropriately, following commands Respiratory: unlabored breathing on room air Skin: incision is well-approximated with no erythema, induration, active/proximal drainage  MSK:  -LLE: No pain with logroll, EHL/TA/GSC intact, sensation intact to light touch in sural/saphenous/deep radial/superficial peroneal/tibial nerve distributions, foot warm and well-perfused  -LUE: And/PIN/IO intact, sensation intact to light touch in median/radial/ulnar nerve distributions, palpable radial pulse, hand warm well-perfused, TTP over the proximal  humerus, no other tenderness palpation with remainder of extremity   Imaging: XR of the left shoulder from 06/10/2024 was independently reviewed and interpreted, showing a significantly displaced proximal humerus fracture with medial translation and shortening through the fracture site. No new fracture seen. No dislocation seen.   XRs of the left hip from 06/10/2024 were independently reviewed and interpreted, showing a valgus impacted femoral neck fracture.  Fracture alignment appears similar to intraoperative films from 05/23/2024.  There are 3 partially-threaded screws spanning the fracture site.  No lucency seen around the screws.  None of the screws have backed out.  No new fracture seen.  No dislocation seen.   Patient name: Michael Odonnell Patient MRN: [id number] Date of visit: 06/10/2024

## 2024-06-17 ENCOUNTER — Encounter (HOSPITAL_COMMUNITY): Payer: Self-pay | Admitting: Orthopedic Surgery

## 2024-06-18 ENCOUNTER — Encounter (HOSPITAL_COMMUNITY)
Admission: EM | Disposition: A | Discharge status: Skilled Nursing Facility | Payer: Self-pay | Source: Skilled Nursing Facility | Attending: Emergency Medicine

## 2024-06-18 ENCOUNTER — Emergency Department (HOSPITAL_COMMUNITY): Admitting: Certified Registered Nurse Anesthetist

## 2024-06-18 ENCOUNTER — Observation Stay (HOSPITAL_COMMUNITY)
Admission: EM | Admit: 2024-06-18 | Discharge: 2024-06-25 | Disposition: A | Discharge status: Skilled Nursing Facility | Source: Skilled Nursing Facility | Attending: Family Medicine | Admitting: Family Medicine

## 2024-06-18 ENCOUNTER — Other Ambulatory Visit: Payer: Self-pay

## 2024-06-18 ENCOUNTER — Emergency Department (HOSPITAL_COMMUNITY)

## 2024-06-18 DIAGNOSIS — D649 Anemia, unspecified: Secondary | ICD-10-CM | POA: Diagnosis not present

## 2024-06-18 DIAGNOSIS — I10 Essential (primary) hypertension: Secondary | ICD-10-CM | POA: Insufficient documentation

## 2024-06-18 DIAGNOSIS — R310 Gross hematuria: Secondary | ICD-10-CM | POA: Diagnosis present

## 2024-06-18 DIAGNOSIS — N400 Enlarged prostate without lower urinary tract symptoms: Secondary | ICD-10-CM | POA: Insufficient documentation

## 2024-06-18 DIAGNOSIS — Z85828 Personal history of other malignant neoplasm of skin: Secondary | ICD-10-CM | POA: Diagnosis not present

## 2024-06-18 DIAGNOSIS — G20A1 Parkinson's disease without dyskinesia, without mention of fluctuations: Secondary | ICD-10-CM | POA: Diagnosis not present

## 2024-06-18 DIAGNOSIS — I480 Paroxysmal atrial fibrillation: Secondary | ICD-10-CM | POA: Diagnosis not present

## 2024-06-18 DIAGNOSIS — Z87891 Personal history of nicotine dependence: Secondary | ICD-10-CM | POA: Insufficient documentation

## 2024-06-18 DIAGNOSIS — Z8781 Personal history of (healed) traumatic fracture: Secondary | ICD-10-CM | POA: Insufficient documentation

## 2024-06-18 DIAGNOSIS — Z79899 Other long term (current) drug therapy: Secondary | ICD-10-CM | POA: Diagnosis not present

## 2024-06-18 HISTORY — PX: CYSTOSCOPY: SHX5120

## 2024-06-18 LAB — BASIC METABOLIC PANEL WITH GFR
Anion gap: 10 (ref 5–15)
BUN: 16 mg/dL (ref 8–23)
CO2: 26 mmol/L (ref 22–32)
Calcium: 9.6 mg/dL (ref 8.9–10.3)
Chloride: 101 mmol/L (ref 98–111)
Creatinine, Ser: 1.12 mg/dL (ref 0.61–1.24)
GFR, Estimated: >60 mL/min
Glucose, Bld: 154 mg/dL — ABNORMAL HIGH (ref 70–99)
Potassium: 4.3 mmol/L (ref 3.5–5.1)
Sodium: 137 mmol/L (ref 135–145)

## 2024-06-18 LAB — URINALYSIS, W/ REFLEX TO CULTURE (INFECTION SUSPECTED)
Bacteria, UA: NONE SEEN
Bilirubin Urine: NEGATIVE
Glucose, UA: NEGATIVE mg/dL
Ketones, ur: NEGATIVE mg/dL
Nitrite: NEGATIVE
Protein, ur: 100 mg/dL — AB
RBC / HPF: >50 RBC/hpf (ref 0–5)
Specific Gravity, Urine: >1.046 — ABNORMAL HIGH (ref 1.005–1.030)
pH: 5 (ref 5.0–8.0)

## 2024-06-18 LAB — CBC WITH DIFFERENTIAL/PLATELET
Abs Immature Granulocytes: 0.13 K/uL — ABNORMAL HIGH (ref 0.00–0.07)
Basophils Absolute: 0 K/uL (ref 0.0–0.1)
Basophils Relative: 0 %
Eosinophils Absolute: 0.3 K/uL (ref 0.0–0.5)
Eosinophils Relative: 2 %
HCT: 34.6 % — ABNORMAL LOW (ref 39.0–52.0)
Hemoglobin: 10.8 g/dL — ABNORMAL LOW (ref 13.0–17.0)
Immature Granulocytes: 1 %
Lymphocytes Relative: 5 %
Lymphs Abs: 0.6 K/uL — ABNORMAL LOW (ref 0.7–4.0)
MCH: 29.4 pg (ref 26.0–34.0)
MCHC: 31.2 g/dL (ref 30.0–36.0)
MCV: 94.3 fL (ref 80.0–100.0)
Monocytes Absolute: 1.2 K/uL — ABNORMAL HIGH (ref 0.1–1.0)
Monocytes Relative: 10 %
Neutro Abs: 9.7 K/uL — ABNORMAL HIGH (ref 1.7–7.7)
Neutrophils Relative %: 82 %
Platelets: 360 K/uL (ref 150–400)
RBC: 3.67 MIL/uL — ABNORMAL LOW (ref 4.22–5.81)
RDW: 14.6 % (ref 11.5–15.5)
WBC: 11.9 K/uL — ABNORMAL HIGH (ref 4.0–10.5)
nRBC: 0 % (ref 0.0–0.2)

## 2024-06-18 MED ORDER — OXYCODONE HCL 5 MG/5ML PO SOLN: 5.0000 mg | Freq: Once | ORAL | Status: DC | PRN

## 2024-06-18 MED ORDER — SODIUM CHLORIDE 0.9 % IR SOLN
Status: DC | PRN
  Administered 2024-06-18: 6000 mL

## 2024-06-18 MED ORDER — PROPOFOL 10 MG/ML IV BOLUS
INTRAVENOUS | Status: AC
  Filled 2024-06-18: qty 20

## 2024-06-18 MED ORDER — IOHEXOL 300 MG/ML  SOLN
100.0000 mL | Freq: Once | INTRAMUSCULAR | Status: AC | PRN
  Administered 2024-06-18: 100 mL via INTRAVENOUS

## 2024-06-18 MED ORDER — SODIUM CHLORIDE 0.9 % IV SOLN
1.0000 g | INTRAVENOUS | Status: DC
  Administered 2024-06-19 – 2024-06-20 (×2): 1 g via INTRAVENOUS
  Filled 2024-06-18 (×2): qty 10

## 2024-06-18 MED ORDER — ADULT MULTIVITAMIN W/MINERALS CH
1.0000 | ORAL_TABLET | Freq: Every day | ORAL | Status: DC
  Administered 2024-06-19 – 2024-06-24 (×6): 1 via ORAL
  Filled 2024-06-18 (×6): qty 1

## 2024-06-18 MED ORDER — FENTANYL CITRATE (PF) 250 MCG/5ML IJ SOLN
INTRAMUSCULAR | Status: DC | PRN
  Administered 2024-06-18 (×6): 25 ug via INTRAVENOUS

## 2024-06-18 MED ORDER — ACETAMINOPHEN 10 MG/ML IV SOLN
INTRAVENOUS | Status: DC | PRN
  Administered 2024-06-18: 1000 mg via INTRAVENOUS

## 2024-06-18 MED ORDER — OXYCODONE HCL 5 MG PO TABS: 5.0000 mg | ORAL_TABLET | Freq: Once | ORAL | Status: DC | PRN

## 2024-06-18 MED ORDER — TRANDOLAPRIL 2 MG PO TABS
4.0000 mg | ORAL_TABLET | Freq: Every day | ORAL | Status: DC
  Administered 2024-06-19 – 2024-06-25 (×7): 4 mg via ORAL
  Filled 2024-06-18 (×7): qty 2

## 2024-06-18 MED ORDER — ONDANSETRON HCL 4 MG/2ML IJ SOLN: 4.0000 mg | Freq: Once | INTRAMUSCULAR | Status: DC | PRN

## 2024-06-18 MED ORDER — CEFAZOLIN SODIUM-DEXTROSE 2-4 GM/100ML-% IV SOLN
INTRAVENOUS | Status: AC
  Filled 2024-06-18: qty 100

## 2024-06-18 MED ORDER — PROPOFOL 10 MG/ML IV BOLUS
INTRAVENOUS | Status: DC | PRN
  Administered 2024-06-18: 80 mg via INTRAVENOUS

## 2024-06-18 MED ORDER — HYDROMORPHONE HCL 1 MG/ML IJ SOLN: 0.5000 mg | INTRAMUSCULAR | Status: DC | PRN

## 2024-06-18 MED ORDER — LIDOCAINE 2% (20 MG/ML) 5 ML SYRINGE
INTRAMUSCULAR | Status: DC | PRN
  Administered 2024-06-18: 100 mg via INTRAVENOUS

## 2024-06-18 MED ORDER — MIRABEGRON ER 25 MG PO TB24
50.0000 mg | ORAL_TABLET | Freq: Every day | ORAL | Status: DC
  Administered 2024-06-19 – 2024-06-24 (×6): 50 mg via ORAL
  Filled 2024-06-18 (×7): qty 2

## 2024-06-18 MED ORDER — ACETAMINOPHEN 650 MG RE SUPP: 650.0000 mg | Freq: Four times a day (QID) | RECTAL | Status: DC | PRN

## 2024-06-18 MED ORDER — MAGNESIUM OXIDE -MG SUPPLEMENT 400 (240 MG) MG PO TABS
400.0000 mg | ORAL_TABLET | Freq: Every day | ORAL | Status: DC
  Administered 2024-06-19 – 2024-06-21 (×3): 400 mg via ORAL
  Filled 2024-06-18 (×3): qty 1

## 2024-06-18 MED ORDER — POLYETHYLENE GLYCOL 3350 17 G PO PACK: 17.0000 g | PACK | Freq: Every day | ORAL | Status: DC | PRN

## 2024-06-18 MED ORDER — CARBIDOPA-LEVODOPA 25-100 MG PO TABS
1.0000 | ORAL_TABLET | ORAL | Status: DC
  Administered 2024-06-19 – 2024-06-25 (×20): 1 via ORAL
  Filled 2024-06-18 (×21): qty 1

## 2024-06-18 MED ORDER — LACTATED RINGERS IV SOLN: INTRAVENOUS | Status: DC | PRN

## 2024-06-18 MED ORDER — FENTANYL CITRATE (PF) 50 MCG/ML IJ SOSY: 25.0000 ug | PREFILLED_SYRINGE | INTRAMUSCULAR | Status: DC | PRN

## 2024-06-18 MED ORDER — ACETAMINOPHEN 325 MG PO TABS
650.0000 mg | ORAL_TABLET | Freq: Four times a day (QID) | ORAL | Status: DC | PRN
  Administered 2024-06-21 – 2024-06-24 (×6): 650 mg via ORAL
  Filled 2024-06-18 (×6): qty 2

## 2024-06-18 MED ORDER — TAMSULOSIN HCL 0.4 MG PO CAPS
0.4000 mg | ORAL_CAPSULE | Freq: Every day | ORAL | Status: DC
  Administered 2024-06-19 – 2024-06-25 (×7): 0.4 mg via ORAL
  Filled 2024-06-18 (×7): qty 1

## 2024-06-18 MED ORDER — FENTANYL CITRATE (PF) 100 MCG/2ML IJ SOLN
INTRAMUSCULAR | Status: AC
  Filled 2024-06-18: qty 2

## 2024-06-18 MED ORDER — ONDANSETRON HCL 4 MG PO TABS: 4.0000 mg | ORAL_TABLET | Freq: Four times a day (QID) | ORAL | Status: DC | PRN

## 2024-06-18 MED ORDER — CARBIDOPA-LEVODOPA 25-100 MG PO TABS
2.0000 | ORAL_TABLET | Freq: Every day | ORAL | Status: DC
  Administered 2024-06-20 – 2024-06-25 (×6): 2 via ORAL
  Filled 2024-06-18 (×7): qty 2

## 2024-06-18 MED ORDER — DILTIAZEM HCL ER BEADS 240 MG PO CP24: 360.0000 mg | ORAL_CAPSULE | Freq: Every day | ORAL | Status: DC

## 2024-06-18 MED ORDER — FINASTERIDE 5 MG PO TABS
5.0000 mg | ORAL_TABLET | Freq: Every day | ORAL | Status: DC
  Administered 2024-06-19 – 2024-06-25 (×7): 5 mg via ORAL
  Filled 2024-06-18 (×7): qty 1

## 2024-06-18 MED ORDER — SODIUM CHLORIDE 0.9 % IR SOLN: 3000.0000 mL | Status: DC

## 2024-06-18 MED ORDER — ACETAMINOPHEN 10 MG/ML IV SOLN
INTRAVENOUS | Status: AC
  Filled 2024-06-18: qty 100

## 2024-06-18 MED ORDER — ONDANSETRON HCL 4 MG/2ML IJ SOLN: 4.0000 mg | Freq: Four times a day (QID) | INTRAMUSCULAR | Status: DC | PRN

## 2024-06-18 MED ORDER — ACETAMINOPHEN 10 MG/ML IV SOLN: 1000.0000 mg | Freq: Once | INTRAVENOUS | Status: DC | PRN

## 2024-06-18 MED ORDER — ALBUTEROL SULFATE (2.5 MG/3ML) 0.083% IN NEBU: 2.5000 mg | INHALATION_SOLUTION | RESPIRATORY_TRACT | Status: DC | PRN

## 2024-06-18 MED ORDER — CEFAZOLIN SODIUM-DEXTROSE 2-4 GM/100ML-% IV SOLN
2.0000 g | INTRAVENOUS | Status: AC
  Administered 2024-06-18: 2 g via INTRAVENOUS

## 2024-06-18 MED ORDER — ONDANSETRON HCL 4 MG/2ML IJ SOLN
INTRAMUSCULAR | Status: DC | PRN
  Administered 2024-06-18: 4 mg via INTRAVENOUS

## 2024-06-18 MED ORDER — SUCCINYLCHOLINE CHLORIDE 200 MG/10ML IV SOSY
PREFILLED_SYRINGE | INTRAVENOUS | Status: DC | PRN
  Administered 2024-06-18: 100 mg via INTRAVENOUS

## 2024-06-18 MED ORDER — LIDOCAINE-EPINEPHRINE 1 %-1:100000 IJ SOLN
INTRAMUSCULAR | Status: AC
  Filled 2024-06-18: qty 1

## 2024-06-18 MED ORDER — OXYCODONE HCL 5 MG PO TABS: 5.0000 mg | ORAL_TABLET | ORAL | Status: DC | PRN

## 2024-06-18 MED ORDER — DILTIAZEM HCL ER COATED BEADS 180 MG PO CP24
360.0000 mg | ORAL_CAPSULE | Freq: Every day | ORAL | Status: DC
  Administered 2024-06-19 – 2024-06-25 (×7): 360 mg via ORAL
  Filled 2024-06-18 (×8): qty 2

## 2024-06-18 MED ORDER — 0.9 % SODIUM CHLORIDE (POUR BTL) OPTIME
TOPICAL | Status: DC | PRN
  Administered 2024-06-18: 1000 mL

## 2024-06-18 NOTE — Consult Note (Signed)
 Urology Consult   Physician requesting consult: Curtistine Dawn, MD  Reason for consult: Gross hematuria and difficult Foley catheter placement  History of Present Illness: Michael Odonnell is a 84 y.o. male seen in the emergency department due to gross hematuria following traumatic Foley placement at his nursing home earlier today.  Per the wife, at bedside, she reports that the patient has not been able to urinate all day and had a Foley catheter placed at his nursing home that never truly drained any urine.  Multiple attempts were made by the nursing staff to place a Foley catheter in the emergency department, but were unsuccessful and led to increased blood per urethra.  He has a significant past medical history of Parkinson's disease as well as BPH and is currently on tamsulosin , finasteride  and Myrbetriq .  CT from today shows bladder distention as well as a significant amount of stool in the rectal vault.  No other GU abnormalities were noted.   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  Allergies: Allergies[2]  Family History  Problem Relation Age of Onset   Heart attack Mother 19   Heart disease Sister    Melanoma Sister    Heart attack Brother    Healthy Daughter     Healthy Daughter    Stomach cancer Neg Hx    Colon cancer Neg Hx     Social History:  reports that he quit smoking about 11 years ago. His smoking use included cigarettes. He has never used smokeless tobacco. He reports that he does not drink alcohol and does not use drugs.  ROS: A complete review of systems was performed.  All systems are negative except for pertinent findings as noted.  Physical Exam:  Vital signs in last 24 hours: Temp:  [97.6 F (36.4 C)-98.2 F (36.8 C)] 98.2 F (36.8 C) (01/08 1518) Pulse Rate:  [68-69] 69 (01/08 1518) Resp:  [16] 16 (01/08 1518) BP: (102-127)/(64) 102/64 (01/08 1518) SpO2:  [98 %-100 %] 100 % (01/08 1518) Constitutional:  Alert and oriented, No acute distress Cardiovascular: Regular rate and rhythm, No JVD Respiratory: Normal respiratory effort, Lungs clear bilaterally GI: Abdomen is soft, nontender, nondistended, no abdominal masses GU: Blood per urethra Lymphatic: No lymphadenopathy Neurologic: Grossly intact, no focal deficits Psychiatric: Normal mood and affect  Laboratory Data:  Recent Labs    06/18/24 1526  WBC 11.9*  HGB 10.8*  HCT 34.6*  PLT 360    Recent Labs    06/18/24 1526  NA 137  K 4.3  CL 101  GLUCOSE 154*  BUN 16  CALCIUM  9.6  CREATININE 1.12     Results for orders placed  or performed during the hospital encounter of 06/18/24 (from the past 24 hours)  CBC with Differential     Status: Abnormal   Collection Time: 06/18/24  3:26 PM  Result Value Ref Range   WBC 11.9 (H) 4.0 - 10.5 K/uL   RBC 3.67 (L) 4.22 - 5.81 MIL/uL   Hemoglobin 10.8 (L) 13.0 - 17.0 g/dL   HCT 65.3 (L) 60.9 - 47.9 %   MCV 94.3 80.0 - 100.0 fL   MCH 29.4 26.0 - 34.0 pg   MCHC 31.2 30.0 - 36.0 g/dL   RDW 85.3 88.4 - 84.4 %   Platelets 360 150 - 400 K/uL   nRBC 0.0 0.0 - 0.2 %   Neutrophils Relative % 82 %   Neutro Abs 9.7 (H) 1.7 - 7.7 K/uL   Lymphocytes Relative 5 %   Lymphs Abs 0.6 (L) 0.7 - 4.0 K/uL   Monocytes Relative 10  %   Monocytes Absolute 1.2 (H) 0.1 - 1.0 K/uL   Eosinophils Relative 2 %   Eosinophils Absolute 0.3 0.0 - 0.5 K/uL   Basophils Relative 0 %   Basophils Absolute 0.0 0.0 - 0.1 K/uL   Immature Granulocytes 1 %   Abs Immature Granulocytes 0.13 (H) 0.00 - 0.07 K/uL  Basic metabolic panel     Status: Abnormal   Collection Time: 06/18/24  3:26 PM  Result Value Ref Range   Sodium 137 135 - 145 mmol/L   Potassium 4.3 3.5 - 5.1 mmol/L   Chloride 101 98 - 111 mmol/L   CO2 26 22 - 32 mmol/L   Glucose, Bld 154 (H) 70 - 99 mg/dL   BUN 16 8 - 23 mg/dL   Creatinine, Ser 8.87 0.61 - 1.24 mg/dL   Calcium  9.6 8.9 - 10.3 mg/dL   GFR, Estimated >39 >39 mL/min   Anion gap 10 5 - 15   No results found for this or any previous visit (from the past 240 hours).  Renal Function: Recent Labs    06/18/24 1526  CREATININE 1.12   CrCl cannot be calculated (Unknown ideal weight.).  Radiologic Imaging: CT ABDOMEN PELVIS W CONTRAST Result Date: 06/18/2024 EXAM: CT ABDOMEN AND PELVIS WITH CONTRAST 06/18/2024 04:59:54 PM TECHNIQUE: CT of the abdomen and pelvis was performed with the administration of 100 mL of iohexol  (OMNIPAQUE ) 300 MG/ML solution. Multiplanar reformatted images are provided for review. Automated exposure control, iterative reconstruction, and/or weight-based adjustment of the mA/kV was utilized to reduce the radiation dose to as low as reasonably achievable. COMPARISON: CT abdomen 12/12/2023. CLINICAL HISTORY: Hematuria, gross/macroscopic. FINDINGS: LOWER CHEST: There is a calcified granuloma in the right lung base. There is linear atelectasis or scarring in both lung bases. LIVER: There are a few subcentimeter hypodensities in the liver which are unchanged, likely cysts or hemangiomas. GALLBLADDER AND BILE DUCTS: Gallbladder is unremarkable. No biliary ductal dilatation. SPLEEN: No acute abnormality. PANCREAS: No acute abnormality. ADRENAL GLANDS: Bilateral adrenal nodules are unchanged and were  previously characterized as adenomas. KIDNEYS, URETERS AND BLADDER: Bilateral renal cysts and complex cysts appear unchanged from the prior study. There is a punctate nonobstructing left renal calculus, also unchanged. The bladder is markedly distended. No hydronephrosis. No perinephric or periureteral stranding. GI AND BOWEL: Stomach demonstrates no acute abnormality. There is a large amount of stool throughout the entire colon. There is diffuse colonic diverticulosis. The appendix appears within normal limits. The rectum is dilated and stool filled measuring up to 9 cm with presacral edema. There is no  bowel obstruction. PERITONEUM AND RETROPERITONEUM: No ascites. No free air. VASCULATURE: Aorta is normal in caliber. There are atherosclerotic calcifications of the aorta and iliac arteries. LYMPH NODES: No lymphadenopathy. REPRODUCTIVE ORGANS: Prostate gland is enlarged with calcifications. BONES AND SOFT TISSUES: Left sided hip screws are present. There is an intramuscular fluid collection lateral to the proximal femoral diaphysis measuring 6.6 x 3.0 x 3.1 cm. No acute osseous abnormality. No focal soft tissue abnormality. IMPRESSION: 1. Markedly distended bladder, suspicious for urinary retention. 2. Fecal impaction with stercoral colitis. 3. Diffuse colonic diverticulosis without evidence of diverticulitis. 4. Moderate sized inguinal hernias, with the right inguinal hernia containing nondilated bowel. 5. Intramuscular fluid collection lateral to the proximal femoral diaphysis measuring 6.6 x 3.0 x 3.1 cm. Correlate clinically for infection. Electronically signed by: Greig Pique MD MD 06/18/2024 05:30 PM EST RP Workstation: [id number]    I independently reviewed the above imaging studies.  Impression/Recommendation 84 year old male with gross hematuria secondary to traumatic Foley catheter placement and urinary retention  -Multiple attempts were made by myself to place a Foley catheter, but were  unsuccessful, likely secondary to a false passage from previous Foley catheter attempts.  Due to the degree of gross hematuria and patient discomfort, we will proceed emergently to the operating room for cystoscopic Foley catheter placement as well as clot evacuation.  Risk, benefits and alternatives were discussed with the patient and his wife.  They voiced understanding and wished to proceed.  Lonni Han, MD [facility] 06/18/2024, 6:49 PM         [1]  No outpatient medications have been marked as taking for the 06/18/24 encounter [facility] Encounter).  [2]  Allergies Allergen Reactions   Hydrochlorothiazide Nausea Only and Palpitations   Codeine Nausea And Vomiting   Doxycycline Nausea Only   Metronidazole Nausea Only   Mysoline [Primidone] Other (See Comments)    Change in balance

## 2024-06-18 NOTE — Anesthesia Procedure Notes (Signed)
 Procedure Name: Intubation Date/Time: 06/18/2024 7:34 PM  Performed by: Cena Epps, CRNAPre-anesthesia Checklist: Patient identified, Emergency Drugs available, Suction available and Patient being monitored Patient Re-evaluated:Patient Re-evaluated prior to induction Oxygen Delivery Method: Circle System Utilized Preoxygenation: Pre-oxygenation with 100% oxygen Induction Type: IV induction Ventilation: Mask ventilation without difficulty Laryngoscope Size: Mac and 4 Grade View: Grade I Tube type: Oral Tube size: 7.5 mm Number of attempts: 1 Airway Equipment and Method: Stylet and Oral airway Placement Confirmation: ETT inserted through vocal cords under direct vision, positive ETCO2 and breath sounds checked- equal and bilateral Secured at: 23 cm Tube secured with: Tape Dental Injury: Teeth and Oropharynx as per pre-operative assessment

## 2024-06-18 NOTE — H&P (Signed)
 " History and Physical  Michael Odonnell:[id number] DOB: 07-May-1941 DOA: 06/18/2024  PCP: Nichole Senior, MD   Chief Complaint: Gross hematuria  HPI: Michael Odonnell is a 84 y.o. male with medical history significant for atrial fibrillation on Eliquis , hypertension, Parkinson's disease, recent hospitalization for hip and humerus fracture and most recently for delirium who is now admitted to the hospitalist service after evaluation and management of gross hematuria.  History is provided by my discussion with ER provider as well as evaluation of the medical record.  Patient is seen in PACU postoperatively.  Most recently, he was discharged from the hospital on 12/22 after his stay for delirium, workup was unrevealing but the patient's delirium resolved and he was discharged back to his subacute nursing facility on that date in stable condition.  Seems that the patient was doing well overall but over the last 24 hours, he was having decreased urinary output, they tried to place a Foley catheter at the facility but there was no urine output, they remove the Foley catheter and there was blood.  Multiple attempts were made in the emergency department to place a Foley catheter which was unsuccessful, patient was noted to have bloody output and clots from the urethral meatus, he was evaluated by urology in the emergency department as well, Dr. Devere was also unable to place a bedside Foley catheter.  He was taken to the operating room, where attempt was made under general anesthesia and was once again unsuccessful.  Ultimately, Dr. Devere elected to place a suprapubic Foley catheter.  Review of Systems: Please see HPI for pertinent positives and negatives. A complete 10 system review of systems are otherwise negative.  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 His smoking use included cigarettes. He has never used smokeless tobacco. He reports that he does not drink alcohol and does not use drugs.  Allergies  Allergen Reactions   Hydrochlorothiazide Nausea Only and Palpitations   Codeine Nausea And Vomiting   Doxycycline Nausea Only   Metronidazole Nausea Only   Mysoline [Primidone] Other (See Comments)    Change in balance    Family History  Problem Relation Age of Onset   Heart attack Mother 34   Heart disease Sister    Melanoma Sister    Heart attack Brother    Healthy Daughter    Healthy Daughter    Stomach cancer Neg Hx    Colon cancer Neg Hx      Prior to Admission medications  Medication Sig Start Date End Date Taking? Authorizing Provider  acetaminophen  (TYLENOL ) 500 MG tablet Take 1,000 mg by mouth every 6 (six) hours as needed for mild pain or headache.     [provider]  apixaban  (  ELIQUIS ) 5 MG TABS tablet TAKE 1 TABLET BY MOUTH TWICE  DAILY 02/13/24   Pietro Redell RAMAN, MD  bisacodyl  (DULCOLAX) 5 MG EC tablet Take 1 tablet (5 mg total) by mouth daily as needed for moderate constipation. 05/26/24   Pokhrel, Laxman, MD  Calcium  Carbonate Antacid (TUMS PO) Take 2 tablets by mouth daily as needed (heartburn).     [provider]  carbidopa -levodopa  (SINEMET  IR) 25-100 MG tablet TAKE 2 TABLETS BY MOUTH AT 6 AM, 1 TABLET AT 10 AM, 1 TABLET AT 2 PM, AND 1 TABLET AT 6 PM 05/19/24   Tat, Asberry RAMAN, DO  clobetasol cream (TEMOVATE) 0.05  % Apply 1 Application topically 2 (two) times daily. for redness 05/12/24   [provider]  diltiazem  (TIAZAC ) 360 MG 24 hr capsule TAKE 1 CAPSULE BY MOUTH DAILY 05/19/24   Pietro Redell RAMAN, MD  finasteride  (PROSCAR ) 5 MG tablet Take 5 mg by mouth daily in the afternoon. 07/06/23   [provider]  Magnesium  400 MG TABS Take 400 mg by mouth daily in the afternoon.    [provider]  magnesium  hydroxide (MILK OF MAGNESIA) 400 MG/5ML suspension Take 30 mLs by mouth daily as needed for mild constipation.    [provider]  methocarbamol  (ROBAXIN ) 500 MG tablet Take 1 tablet (500 mg total) by mouth every 6 (six) hours as needed for muscle spasms. 05/26/24   Pokhrel, Laxman, MD  mirabegron  ER (MYRBETRIQ ) 50 MG TB24 tablet Take 50 mg by mouth daily at 4 PM. 04/16/23   [provider]  Multiple Vitamins-Minerals (CENTRUM SILVER 50+MEN) TABS Take 1 tablet by mouth at bedtime.    [provider]  niacin  500 MG tablet Take 500 mg by mouth 2 (two) times daily with a meal.    [provider]  Nystatin (GERHARDT'S BUTT CREAM) CREA Apply 1 Application topically 2 (two) times daily. Apply to buttocks Patient not taking: Reported on 05/28/2024    [provider]  oxyCODONE  (OXY IR/ROXICODONE ) 5 MG immediate release tablet Take 1 tablet (5 mg total) by mouth every 6 (six) hours as needed for moderate pain (pain score 4-6) or severe pain (pain score 7-10). 06/01/24   Vernon Ranks, MD  perindopril (ACEON) 8 MG tablet Take 8 mg by mouth in the morning. 12/30/17   [provider]  Polyethyl Glyc-Propyl Glyc PF (SYSTANE HYDRATION PF) 0.4-0.3 % SOLN Place 1 drop into both eyes daily as needed (dry eyes; eye irritations).    [provider]  polyethylene glycol (MIRALAX  / GLYCOLAX ) 17 g packet Take 17 g by mouth daily as needed for moderate constipation.    [provider]  Sodium Phosphates  (ENEMA READY-TO-USE RE) Place 1  Bottle rectally daily as needed (for constipation).    [provider]  tamsulosin  (FLOMAX ) 0.4 MG CAPS capsule Take 1 capsule (0.4 mg total) by mouth daily. Patient taking differently: Take 0.4 mg by mouth in the morning. 12/22/16   Mackuen, Jackye Saha, MD    Physical Exam: BP 102/64   Pulse 69   Temp 98.2 F (36.8 C) (Oral)   Resp 16   SpO2 100%  General: Patient is still very somnolent postoperatively, following some commands and moving all extremities spontaneously. Cardiovascular: RRR, no murmurs or rubs, no peripheral edema  Respiratory: clear to auscultation bilaterally, no wheezes, no crackles  Abdomen: soft, nontender, nondistended, normal bowel tones heard  Musculoskeletal: no joint effusions, normal range of motion  GU: Suprapubic catheter in  place with blood-tinged clear yellow urine         Labs on Admission:  Basic Metabolic Panel: Recent Labs  Lab 06/18/24 1526  NA 137  K 4.3  CL 101  CO2 26  GLUCOSE 154*  BUN 16  CREATININE 1.12  CALCIUM  9.6   Liver Function Tests: No results for input(s): AST, ALT, ALKPHOS, BILITOT, PROT, ALBUMIN  in the last 168 hours. No results for input(s): LIPASE, AMYLASE in the last 168 hours. No results for input(s): AMMONIA in the last 168 hours. CBC: Recent Labs  Lab 06/18/24 1526  WBC 11.9*  NEUTROABS 9.7*  HGB 10.8*  HCT 34.6*  MCV 94.3  PLT 360   Cardiac Enzymes: No results for input(s): CKTOTAL, CKMB, CKMBINDEX, TROPONINI in the last 168 hours. BNP (last 3 results) No results for input(s): BNP in the last 8760 hours.  ProBNP (last 3 results) No results for input(s): PROBNP in the last 8760 hours.  CBG: No results for input(s): GLUCAP in the last 168 hours.  Radiological Exams on Admission: CT ABDOMEN PELVIS W CONTRAST Result Date: 06/18/2024 EXAM: CT ABDOMEN AND PELVIS WITH CONTRAST 06/18/2024 04:59:54 PM TECHNIQUE: CT of the abdomen and pelvis was performed with the  administration of 100 mL of iohexol  (OMNIPAQUE ) 300 MG/ML solution. Multiplanar reformatted images are provided for review. Automated exposure control, iterative reconstruction, and/or weight-based adjustment of the mA/kV was utilized to reduce the radiation dose to as low as reasonably achievable. COMPARISON: CT abdomen 12/12/2023. CLINICAL HISTORY: Hematuria, gross/macroscopic. FINDINGS: LOWER CHEST: There is a calcified granuloma in the right lung base. There is linear atelectasis or scarring in both lung bases. LIVER: There are a few subcentimeter hypodensities in the liver which are unchanged, likely cysts or hemangiomas. GALLBLADDER AND BILE DUCTS: Gallbladder is unremarkable. No biliary ductal dilatation. SPLEEN: No acute abnormality. PANCREAS: No acute abnormality. ADRENAL GLANDS: Bilateral adrenal nodules are unchanged and were previously characterized as adenomas. KIDNEYS, URETERS AND BLADDER: Bilateral renal cysts and complex cysts appear unchanged from the prior study. There is a punctate nonobstructing left renal calculus, also unchanged. The bladder is markedly distended. No hydronephrosis. No perinephric or periureteral stranding. GI AND BOWEL: Stomach demonstrates no acute abnormality. There is a large amount of stool throughout the entire colon. There is diffuse colonic diverticulosis. The appendix appears within normal limits. The rectum is dilated and stool filled measuring up to 9 cm with presacral edema. There is no bowel obstruction. PERITONEUM AND RETROPERITONEUM: No ascites. No free air. VASCULATURE: Aorta is normal in caliber. There are atherosclerotic calcifications of the aorta and iliac arteries. LYMPH NODES: No lymphadenopathy. REPRODUCTIVE ORGANS: Prostate gland is enlarged with calcifications. BONES AND SOFT TISSUES: Left sided hip screws are present. There is an intramuscular fluid collection lateral to the proximal femoral diaphysis measuring 6.6 x 3.0 x 3.1 cm. No acute osseous  abnormality. No focal soft tissue abnormality. IMPRESSION: 1. Markedly distended bladder, suspicious for urinary retention. 2. Fecal impaction with stercoral colitis. 3. Diffuse colonic diverticulosis without evidence of diverticulitis. 4. Moderate sized inguinal hernias, with the right inguinal hernia containing nondilated bowel. 5. Intramuscular fluid collection lateral to the proximal femoral diaphysis measuring 6.6 x 3.0 x 3.1 cm. Correlate clinically for infection. Electronically signed by: Greig Pique MD MD 06/18/2024 05:30 PM EST RP Workstation: [id number]   Assessment/Plan Danie Diehl is a 84 y.o. male with medical history significant for atrial fibrillation on Eliquis , hypertension, Parkinson's disease, recent hospitalization for hip and humerus fracture and most recently for delirium who  is now admitted to the hospitalist service after evaluation and management of gross hematuria.    Gross hematuria-likely due to traumatic Foley catheter insertion at his nursing facility, multiple bedside attempts in the ER for Foley catheter placement including with urology were unsuccessful and the patient has now had suprapubic catheter placed. -Observation admission -Avoid blood thinners, hold Eliquis  -Monitor CBC with daily labs -Continue empiric IV Rocephin  per urology recommendation  Atrial fibrillation-continue home Cardizem , hold Eliquis   Hypertension-Cardizem , Mavik   Parkinson's disease-Sinemet   BPH-Proscar   DVT prophylaxis: SCDs only    Code Status: Full Code  Consults called: Urology  Admission status: Observation  Time spent: 51 minutes  Maryjayne Kleven CHRISTELLA Gail MD [facility] Pager [phone]  If 7PM-7AM, please contact night-coverage [email or web address] Password [facility]  06/18/2024, 7:11 PM   "

## 2024-06-18 NOTE — Transfer of Care (Signed)
 Immediate Anesthesia Transfer of Care Note  Patient: Michael Odonnell  Procedure(s) Performed: CYSTOSCOPY, PLACEMENT OF SUPRAPUBIC CATHETER, FLEXIBLE CYSTOSCOPY  Patient Location: PACU  Anesthesia Type:General  Level of Consciousness: sedated  Airway & Oxygen Therapy: Patient Spontanous Breathing and Patient connected to nasal cannula oxygen  Post-op Assessment: Report given to RN and Post -op Vital signs reviewed and stable  Post vital signs: Reviewed and stable  Last Vitals:  Vitals Value Taken Time  BP 111/43 06/18/24 20:30  Temp    Pulse 66 06/18/24 20:31  Resp 12 06/18/24 20:31  SpO2 96 % 06/18/24 20:31  Vitals shown include unfiled device data.  Last Pain:  Vitals:   06/18/24 1518  TempSrc: Oral  PainSc:          Complications: No notable events documented.

## 2024-06-18 NOTE — Anesthesia Preprocedure Evaluation (Signed)
"                                    Anesthesia Evaluation  Patient identified by MRN, date of birth, ID band Patient awake    Reviewed: Allergy & Precautions, NPO status , Patient's Chart, lab work & pertinent test results, reviewed documented beta blocker date and time   History of Anesthesia Complications Negative for: history of anesthetic complications  Airway Mallampati: II  TM Distance: >3 FB     Dental no notable dental hx.    Pulmonary neg COPD, former smoker   breath sounds clear to auscultation       Cardiovascular hypertension, (-) CAD, (-) Past MI and (-) Cardiac Stents + dysrhythmias Atrial Fibrillation  Rhythm:Regular Rate:Normal     Neuro/Psych neg Seizures PSYCHIATRIC DISORDERS      PD  Neuromuscular disease    GI/Hepatic ,GERD  ,,(+) neg Cirrhosis        Endo/Other    Renal/GU Renal disease     Musculoskeletal  (+) Arthritis ,    Abdominal   Peds  Hematology  (+) Blood dyscrasia, anemia   Anesthesia Other Findings   Reproductive/Obstetrics                              Anesthesia Physical Anesthesia Plan  ASA: 3  Anesthesia Plan: General   Post-op Pain Management:    Induction: Intravenous  PONV Risk Score and Plan: 2 and Ondansetron  and Dexamethasone  Airway Management Planned: Oral ETT  Additional Equipment:   Intra-op Plan:   Post-operative Plan: Extubation in OR  Informed Consent: I have reviewed the patients History and Physical, chart, labs and discussed the procedure including the risks, benefits and alternatives for the proposed anesthesia with the patient or authorized representative who has indicated his/her understanding and acceptance.     Dental advisory given and Consent reviewed with POA  Plan Discussed with: CRNA  Anesthesia Plan Comments:          Anesthesia Quick Evaluation  "

## 2024-06-18 NOTE — ED Provider Notes (Signed)
 Patient signed to me by Dr. Mannie pending results of x-rays.  Has evidence of urinary retention likely due to hematuria from being on Eliquis .  Nursing attempted Foley catheter x 2 unsuccessfully.  Spoke to Dr. Devere from urology who came and saw the patient and he could not pass the catheter either.  He is taking patient to the operating room and has requested hospitalist admission   Michael Faden, MD 06/18/24 1851

## 2024-06-18 NOTE — ED Provider Notes (Signed)
 " Michael Odonnell EMERGENCY DEPARTMENT AT [facility] Provider Note  CSN: [id number] Arrival date & time: 06/18/24 1418  Chief Complaint(s) No chief complaint on file.  HPI Michael Odonnell is a 84 y.o. male who is here today for hematuria and blood in his urine.  Coming from a skilled nursing facility following a repair of a femoral neck fracture.  In that fall, he also fractured his humerus which is being treated with a sling.  He is at a skilled nursing facility, and they report that the patient was having decreased urinary output over the last 24 hours.  They placed a Foley catheter with no output.  When they removed the patient's Foley catheter they reported that there was blood.  Patient is on Eliquis  for atrial fibrillation.  Patient with a history of Parkinson's disease on Sinemet .   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 Authorizing Provider  acetaminophen  (TYLENOL ) 500 MG tablet Take 1,000 mg by mouth every 6 (six) hours as needed for mild pain or headache.     [provider]  apixaban  (ELIQUIS ) 5 MG TABS tablet TAKE  1 TABLET BY MOUTH TWICE  DAILY 02/13/24   Pietro Redell RAMAN, MD  bisacodyl  (DULCOLAX) 5 MG EC tablet Take 1 tablet (5 mg total) by mouth daily as needed for moderate constipation. 05/26/24   Pokhrel, Laxman, MD  Calcium  Carbonate Antacid (TUMS PO) Take 2 tablets by mouth daily as needed (heartburn).     [provider]  carbidopa -levodopa  (SINEMET  IR) 25-100 MG tablet TAKE 2 TABLETS BY MOUTH AT 6 AM, 1 TABLET AT 10 AM, 1 TABLET AT 2 PM, AND 1 TABLET AT 6 PM 05/19/24   Tat, Asberry RAMAN, DO  clobetasol cream (TEMOVATE) 0.05 % Apply 1 Application topically 2 (two) times daily. for redness 05/12/24   [provider]  diltiazem  (TIAZAC ) 360 MG 24 hr capsule TAKE 1 CAPSULE BY MOUTH DAILY 05/19/24   Pietro Redell RAMAN, MD  finasteride  (PROSCAR ) 5 MG tablet Take 5 mg by mouth daily in the afternoon. 07/06/23   [provider]  Magnesium  400 MG TABS Take 400 mg by mouth daily in the afternoon.    [provider]  magnesium  hydroxide (MILK OF MAGNESIA) 400 MG/5ML suspension Take 30 mLs by mouth daily as needed for mild constipation.    [provider]  methocarbamol  (ROBAXIN ) 500 MG tablet Take 1 tablet (500 mg total) by mouth every 6 (six) hours as needed for muscle spasms. 05/26/24   Pokhrel, Laxman, MD  mirabegron  ER (MYRBETRIQ ) 50 MG TB24 tablet  Take 50 mg by mouth daily at 4 PM. 04/16/23   [provider]  Multiple Vitamins-Minerals (CENTRUM SILVER 50+MEN) TABS Take 1 tablet by mouth at bedtime.    [provider]  niacin  500 MG tablet Take 500 mg by mouth 2 (two) times daily with a meal.    [provider]  Nystatin (GERHARDT'S BUTT CREAM) CREA Apply 1 Application topically 2 (two) times daily. Apply to buttocks Patient not taking: Reported on 05/28/2024    [provider]  oxyCODONE  (OXY IR/ROXICODONE ) 5 MG immediate release tablet Take 1 tablet (5 mg total) by mouth every 6 (six) hours as needed for moderate pain (pain score 4-6) or  severe pain (pain score 7-10). 06/01/24   Vernon Ranks, MD  perindopril (ACEON) 8 MG tablet Take 8 mg by mouth in the morning. 12/30/17   [provider]  Polyethyl Glyc-Propyl Glyc PF (SYSTANE HYDRATION PF) 0.4-0.3 % SOLN Place 1 drop into both eyes daily as needed (dry eyes; eye irritations).    [provider]  polyethylene glycol (MIRALAX  / GLYCOLAX ) 17 g packet Take 17 g by mouth daily as needed for moderate constipation.    [provider]  Sodium Phosphates  (ENEMA READY-TO-USE RE) Place 1 Bottle rectally daily as needed (for constipation).    [provider]  tamsulosin  (FLOMAX ) 0.4 MG CAPS capsule Take 1 capsule (0.4 mg total) by mouth daily. Patient taking differently: Take 0.4 mg by mouth in the morning. 12/22/16   Mackuen, Jackye Saha, MD                                                                                                                                    Past Surgical History Past Surgical History:  Procedure Laterality Date   CLEFT LIP REPAIR     CLEFT PALATE REPAIR     HIP PINNING,CANNULATED Left 05/23/2024   Procedure: PERCUTANEOUS FIXATION OF FEMURAL NECK;  Surgeon: Georgina Ozell LABOR, MD;  Location: [facility] OR;  Service: Orthopedics;  Laterality: Left;   SKIN GRAFT FULL THICKNESS LEG  12/2018   Family History Family History  Problem Relation Age of Onset   Heart attack Mother 76   Heart disease Sister    Melanoma Sister    Heart attack Brother    Healthy Daughter    Healthy Daughter    Stomach cancer Neg Hx    Colon cancer Neg Hx     Social History Social History[1] Allergies Hydrochlorothiazide, Codeine, Doxycycline, Metronidazole, and Mysoline [primidone]  Review of Systems Review of Systems  Physical Exam Vital Signs  I have reviewed the triage vital signs There were no vitals taken for this visit.  Physical Exam Vitals and nursing note reviewed.  Cardiovascular:     Rate and Rhythm: Normal rate.  Pulmonary:      Effort: Pulmonary effort is normal.  Abdominal:     General: Abdomen is flat. There is no  distension.     Palpations: Abdomen is soft.     Tenderness: There is no abdominal tenderness.  Genitourinary:    Penis: Normal.      Testes: Normal.  Neurological:     Mental Status: He is alert.     ED Results and Treatments Labs (all labs ordered are listed, but only abnormal results are displayed) Labs Reviewed  CBC WITH DIFFERENTIAL/PLATELET  BASIC METABOLIC PANEL WITH GFR  URINALYSIS, W/ REFLEX TO CULTURE (INFECTION SUSPECTED)                                                                                                                          Radiology No results found.  Pertinent labs & imaging results that were available during my care of the patient were reviewed by me and considered in my medical decision making (see MDM for details).  Medications Ordered in ED Medications - No data to display                                                                                                                                   Procedures Procedures  (including critical care time)  Medical Decision Making / ED Course   This patient presents to the ED for concern of frank hematuria, this involves an extensive number of treatment options, and is a complaint that carries with it a high risk of complications and morbidity.  The differential diagnosis includes cystitis, UTI, bladder hematoma, bladder hemorrhage.  MDM: 84 year old male here today for hematuria.  Will check blood work on the patient, obtain imaging the patient's abdomen and pelvis.  With his anticoagulated status, do have some concern for bladder hemorrhage.  Potential for urethral injury given placement of Foley catheter by skilled nursing facility without return.  Patient will be signed out to Dr. Dasie pending blood work, urinalysis and imaging.   Additional history obtained: -Additional history obtained  from mass -External records from outside source obtained and reviewed including: Chart review including previous notes, labs, imaging, consultation notes   Lab Tests: -I ordered, reviewed, and interpreted labs.   The pertinent results include:   Labs Reviewed  CBC WITH DIFFERENTIAL/PLATELET  BASIC METABOLIC PANEL WITH GFR  URINALYSIS, W/ REFLEX TO CULTURE (INFECTION SUSPECTED)       Imaging Studies ordered: I ordered imaging studies including CT abdomen pelvis I independently visualized and interpreted imaging. I agree with  the radiologist interpretation   Medicines ordered and prescription drug management: No orders of the defined types were placed in this encounter.   -I have reviewed the patients home medicines and have made adjustments as needed       Final Clinical Impression(s) / ED Diagnoses Final diagnoses:  Gross hematuria     @PCDICTATION @     [1]  Social History Tobacco Use   Smoking status: Former    Current packs/day: 0.00    Types: Cigarettes    Quit date: 12/20/2012    Years since quitting: 11.5   Smokeless tobacco: Never  Vaping Use   Vaping status: Never Used  Substance Use Topics   Alcohol use: No   Drug use: No     Mannie Pac T, DO 06/18/24 1430  "

## 2024-06-18 NOTE — ED Notes (Signed)
 Writer and Marolyn, RN attempted 3 way foley to irrigate pts cath. #18 inserted with blood and clots in the beginning then no return. Tried to irrigate. NS would flush in without return of urine. Spoke with Dr Dasie, Attempted a #20 without success. Meanwhile, upon removing #18 large clots in cath opening and removed from meatus  with washcloth. Pt continues to slowing pass bloody urine and clots as they dribble out. Spoke with Dr Dasie again who has consulted urology to come evaluate pt.   Pt was cleaned and urinal placed to cath urine and clots.

## 2024-06-18 NOTE — ED Triage Notes (Signed)
 Pt BIB EMS due to decreased urinary output today. Foley cath placed at facility this morning with little output. When facility tried deflating balloon and removed catheter facility reports blood from catheter.  Denies pain

## 2024-06-18 NOTE — Op Note (Signed)
 Operative Note  Preoperative diagnosis:  1.  Gross hematuria and urinary retention following traumatic Foley catheter placement  Postoperative diagnosis: 1.  Gross hematuria and urinary retention following traumatic Foley catheter placement 2.  Obliterated membranous urethra  Procedure(s): 1.  Cystoscopy 2.  Suprapubic tube placement  Surgeon: Lonni Han, MD  Assistants:  None  Anesthesia:  General  Complications:  None  EBL: 50 mL  Specimens: 1.  None  Drains/Catheters: 1.  16 French suprapubic tube  Intraoperative findings:   Completely obliterated membranous urethra with no identifiable true lumen 16 French suprapubic tube placed with return of yellow urine  Indication:  Michael Odonnell is a 84 y.o. male with with a history of Parkinson disease and BPH.  Per the patient's wife, the patient had a Foley catheter placed at his nursing facility due to urinary retention that never truly drained any urine.  He presented to the emergency department with blood per urethra and persistent urinary retention.  Multiple attempts were made by the nursing staff as well as myself to place a Foley catheter at bedside in the emergency department, but were ultimately unsuccessful.  Due to significant hematuria and patient discomfort, the patient was brought to the operating room for cystoscopic evaluation.  The patient and his wife have been consented for the above procedures, voiced understanding and wished to proceed.  Description of procedure:  After informed consent was obtained, the patient was brought to the operating room and general LMA anesthesia was administered. The patient was then placed in the dorsolithotomy position and prepped and draped in the usual sterile fashion. A timeout was performed. A 21 French rigid cystoscope was then inserted into the urethral meatus and advanced to the membranous urethra, which appeared completely obliterated from previous catheter trauma.   Multiple attempts were made to place a Glidewire into the more proximal aspects of the urethra, but were ultimately unsuccessful.  I attempted to manually grasp the clots within the urethra to help unobscured the true lumen, but was unable to identify any semblance of the true lumen of the membranous urethra.  At that point, made decision to place a suprapubic tube.  A transverse incision was then made in the midline 2 breaths above the pubic symphysis.  The bladder could be easily palpable on examination.  A 16 French suprapubic trocar was then inserted through the midline incision and into the bladder with return of clear irrigant.  A 16 French Foley catheter was then inserted through the sheath of the trocar and into the bladder.  The suprapubic tube balloon was inflated with 10 mL sterile water and extensively hand irrigated with return of yellow urine.  The suprapubic tube was then secured in place with a 0 silk suture.  The midline incision was then dressed appropriately.  He tolerated the procedure well and was transferred to the postanesthesia in stable condition.   Plan: Monitor on the floor overnight.  Recommend holding Eliquis  until his bleeding from his urethral trauma subsides.  He will likely need to have a long-term suprapubic tube due to the extensive trauma to his urethra.

## 2024-06-19 ENCOUNTER — Encounter (HOSPITAL_COMMUNITY): Payer: Self-pay | Admitting: Internal Medicine

## 2024-06-19 DIAGNOSIS — R310 Gross hematuria: Secondary | ICD-10-CM

## 2024-06-19 LAB — BASIC METABOLIC PANEL WITH GFR
Anion gap: 9 (ref 5–15)
BUN: 17 mg/dL (ref 8–23)
CO2: 23 mmol/L (ref 22–32)
Calcium: 8.3 mg/dL — ABNORMAL LOW (ref 8.9–10.3)
Chloride: 104 mmol/L (ref 98–111)
Creatinine, Ser: 1.15 mg/dL (ref 0.61–1.24)
GFR, Estimated: >60 mL/min
Glucose, Bld: 181 mg/dL — ABNORMAL HIGH (ref 70–99)
Potassium: 4.1 mmol/L (ref 3.5–5.1)
Sodium: 136 mmol/L (ref 135–145)

## 2024-06-19 LAB — CBC
HCT: 25.6 % — ABNORMAL LOW (ref 39.0–52.0)
Hemoglobin: 8 g/dL — ABNORMAL LOW (ref 13.0–17.0)
MCH: 29.5 pg (ref 26.0–34.0)
MCHC: 31.3 g/dL (ref 30.0–36.0)
MCV: 94.5 fL (ref 80.0–100.0)
Platelets: 249 K/uL (ref 150–400)
RBC: 2.71 MIL/uL — ABNORMAL LOW (ref 4.22–5.81)
RDW: 14.8 % (ref 11.5–15.5)
WBC: 21.4 K/uL — ABNORMAL HIGH (ref 4.0–10.5)
nRBC: 0 % (ref 0.0–0.2)

## 2024-06-19 MED ORDER — POLYETHYLENE GLYCOL 3350 17 G PO PACK
17.0000 g | PACK | Freq: Two times a day (BID) | ORAL | Status: DC
  Administered 2024-06-19 – 2024-06-24 (×8): 17 g via ORAL
  Filled 2024-06-19 (×10): qty 1

## 2024-06-19 MED ORDER — CHLORHEXIDINE GLUCONATE CLOTH 2 % EX PADS
6.0000 | MEDICATED_PAD | Freq: Every day | CUTANEOUS | Status: DC
  Administered 2024-06-19 – 2024-06-24 (×6): 6 via TOPICAL

## 2024-06-19 MED ORDER — BISACODYL 10 MG RE SUPP
10.0000 mg | Freq: Once | RECTAL | Status: AC
  Administered 2024-06-19: 10 mg via RECTAL
  Filled 2024-06-19: qty 1

## 2024-06-19 MED ORDER — SENNOSIDES-DOCUSATE SODIUM 8.6-50 MG PO TABS
2.0000 | ORAL_TABLET | Freq: Two times a day (BID) | ORAL | Status: DC
  Administered 2024-06-19 – 2024-06-24 (×8): 2 via ORAL
  Filled 2024-06-19 (×10): qty 2

## 2024-06-19 MED ORDER — SODIUM CHLORIDE 0.9 % IV SOLN
2.0000 g | Freq: Two times a day (BID) | INTRAVENOUS | Status: DC
  Administered 2024-06-19: 2 g via INTRAVENOUS
  Filled 2024-06-19: qty 20

## 2024-06-19 NOTE — Evaluation (Signed)
 Physical Therapy Evaluation Patient Details Name: Michael Odonnell MRN: [id number] DOB: 15-Mar-1941 Today's Date: 06/19/2024  History of Present Illness  Pt admitted from [facility] SNF 2* hematuria and now S/P suprapubic cath placement.  Pt in [facility] SN[facility] for rehab followin L hip fx (s/p pinning) and L humeral fx ( nonoperative).  Pt with hx of Parkinsons, sciatica and a-fib on Eliquos.  Clinical Impression  Pt admitted as above and presenting with functional mobility limitations 2* generalized weakness, non-use of L UE, balance deficits and very limited activity tolerance.  This am, pt tolerated move to EOB sitting and sitting x ~6 minutes before fatiguing and returning to supine.  Patient will benefit from continued inpatient follow up therapy, <3 hours/day         If plan is discharge home, recommend the following: A lot of help with walking and/or transfers;A lot of help with bathing/dressing/bathroom;Assist for transportation;Help with stairs or ramp for entrance   Can travel by private vehicle   No    Equipment Recommendations Wheelchair (measurements PT);Wheelchair cushion (measurements PT);BSC/3in1;Hospital bed  Recommendations for Other Services       Functional Status Assessment Patient has had a recent decline in their functional status and demonstrates the ability to make significant improvements in function in a reasonable and predictable amount of time.     Precautions / Restrictions Precautions Precautions: Fall Recall of Precautions/Restrictions: Impaired Precaution/Restrictions Comments: hx parkinson's Required Braces or Orthoses: Sling Restrictions Weight Bearing Restrictions Per Provider Order: Yes LUE Weight Bearing Per Provider Order: Non weight bearing LLE Weight Bearing Per Provider Order: Weight bearing as tolerated      Mobility  Bed Mobility Overal bed mobility: Needs Assistance Bed Mobility: Supine to Sit, Sit to Supine, Rolling Rolling: Max assist    Supine to sit: Mod assist, Max assist, +2 for physical assistance, +2 for safety/equipment Sit to supine: Max assist, +2 for physical assistance, +2 for safety/equipment   General bed mobility comments: Increased time with cues for sequence, use of R LE to self assist and with extensive use of bed pad to complete rotation to/from EOB sitting    Transfers                   General transfer comment: NT 2* pt fatigue    Ambulation/Gait                  Stairs            Wheelchair Mobility     Tilt Bed    Modified Rankin (Stroke Patients Only)       Balance Overall balance assessment: Needs assistance, History of Falls Sitting-balance support: Single extremity supported, Feet supported Sitting balance-Leahy Scale: Poor Sitting balance - Comments: Utilized bed rail for support while seated EOB. Postural control: Posterior lean                                   Pertinent Vitals/Pain Pain Assessment Pain Assessment: No/denies pain    Home Living Family/patient expects to be discharged to:: Skilled nursing facility                        Prior Function Prior Level of Function : Needs assist;History of Falls (last six months)             Mobility Comments: Normally uses rollator - pt vague regarding mobility as rehab facility  Extremity/Trunk Assessment   Upper Extremity Assessment Upper Extremity Assessment: Generalized weakness;Left hand dominant;LUE deficits/detail LUE Deficits / Details: L proximal humerus fx, currently in sling to mobilize    Lower Extremity Assessment Lower Extremity Assessment: Generalized weakness (L weaker vs R)    Cervical / Trunk Assessment Cervical / Trunk Assessment: Kyphotic  Communication   Communication Communication: Impaired Factors Affecting Communication: Difficulty expressing self    Cognition Arousal: Lethargic (but rousable) Behavior During Therapy: Flat affect,  WFL for tasks assessed/performed   PT - Cognitive impairments: Memory, Attention, Initiation, Sequencing, Problem solving, Safety/Judgement                         Following commands: Impaired Following commands impaired: Only follows one step commands consistently, Follows one step commands with increased time     Cueing Cueing Techniques: Verbal cues, Gestural cues, Visual cues, Tactile cues     General Comments General comments (skin integrity, edema, etc.): Wife present    Exercises     Assessment/Plan    PT Assessment Patient needs continued PT services  PT Problem List Decreased strength;Decreased range of motion;Decreased activity tolerance;Decreased balance;Decreased mobility;Decreased cognition;Decreased knowledge of use of DME;Decreased safety awareness;Decreased knowledge of precautions       PT Treatment Interventions DME instruction;Gait training;Stair training;Therapeutic activities;Functional mobility training;Therapeutic exercise;Balance training;Neuromuscular re-education;Patient/family education;Wheelchair mobility training    PT Goals (Current goals can be found in the Care Plan section)  Acute Rehab PT Goals Patient Stated Goal: to improve mobility PT Goal Formulation: With family Time For Goal Achievement: 06/11/24 Potential to Achieve Goals: Good    Frequency Min 2X/week     Co-evaluation               AM-PAC PT 6 Clicks Mobility  Outcome Measure Help needed turning from your back to your side while in a flat bed without using bedrails?: A Lot Help needed moving from lying on your back to sitting on the side of a flat bed without using bedrails?: A Lot Help needed moving to and from a bed to a chair (including a wheelchair)?: Total Help needed standing up from a chair using your arms (e.g., wheelchair or bedside chair)?: Total Help needed to walk in hospital room?: Total Help needed climbing 3-5 steps with a railing? : Total 6  Click Score: 8    End of Session Equipment Utilized During Treatment: Gait belt Activity Tolerance: Patient limited by fatigue;Patient limited by lethargy Patient left: in bed;with call bell/phone within reach;with family/visitor present;with nursing/sitter in room Nurse Communication: Mobility status PT Visit Diagnosis: Muscle weakness (generalized) (M62.81);History of falling (Z91.81)    Time: 8859-8792 PT Time Calculation (min) (ACUTE ONLY): 27 min   Charges:   PT Evaluation $PT Eval Low Complexity: 1 Low PT Treatments $Therapeutic Activity: 8-22 mins PT General Charges $$ ACUTE PT VISIT: 1 Visit         [facility] PT Acute Rehabilitation Services Office [phone]   Daylen Hack 06/19/2024, 1:29 PM

## 2024-06-19 NOTE — Progress Notes (Signed)
 [facility] Progress Note Patient: Michael Odonnell FMW:[id number] DOB: 1940-12-03  DOA: 06/18/2024 DOS: the patient was seen and examined on 06/19/2024  Brief Hospital Course: Patient with PMH of chronic A-fib on Eliquis , HTN, Parkinson's disease, recent hip and humerus fracture, HTN, GERD presented to hospital with complaints of difficulty urination.  Patient is from facility.  They had difficulty urination and facility attempted to place a catheter.  Upon removal of the Foley catheter at the facility they did notice some blood and patient was brought to the hospital for further workup.  Urology was consulted.  Unsuccessful bedside attempt likely to false passage.  Underwent urgent cystoscopy but unable to pass a guidewire following which decision was made to place a suprapubic catheter. Assessment and Plan: Gross hematuria In the setting of traumatic Foley catheter insertion. Appreciate urology assistance. Underwent cystoscopy and suprapubic catheter placement. Currently on IV antibiotic. Per urology patient can be discharged home likely tomorrow.  Chronic A-fib Hematuria Patient on Eliquis  at home for A-fib. Also on Cardizem . Currently Eliquis  on hold. Discussed with urology, recommend to resume Eliquis  tomorrow and monitor.  HTN. On Cardizem . Continue with same medication for now.  Parkinson's disease. Patient sees PT OT and SLP at the facility. Continuing Sinemet . Monitor for improvement in oral intake.  Left hip fracture status post ORIF 05/23/2024 WBAT.  Follow-up with Dr. Georgina in the office in 4 weeks.  Left proximal humerus fracture Continue left arm sling.  Nonweightbearing left upper extremity.  Subjective: Patient was sleepy in the morning.  Later in the day mentation improved.  No nausea or vomiting.  Minimal oral intake.  Urine in the suprapubic catheter is without any blood.  Physical Exam: Clear to auscultation. S1-S2 present Bowel sounds Nontender  No  edema of lower EXTR Alert awake and oriented  Data Reviewed: I have Reviewed nursing notes, Vitals, and Lab results. Since last encounter, pertinent lab results CBC and BMP   . I have ordered test including CBC and BMP  . I have discussed pt's care plan and test results with urology  .   Disposition: Status is: Observation Continue IV antibiotic, monitor for improvement in mentation and oral intake.  Resume anticoagulation tomorrow and monitor stability.  Plan is to go to SNF SCDs Start: 06/18/24 2127   Family Communication: Wife at bedside Level of care: Med-Surg   Vitals:   06/19/24 0500 06/19/24 1055 06/19/24 1159 06/19/24 1427  BP: (!) 123/58 (!) 114/58 130/65 (!) 125/51  Pulse: 84 86 85 79  Resp: 16   16  Temp: 97.7 F (36.5 C)   98.6 F (37 C)  TempSrc: Axillary   Oral  SpO2: 100%   97%  Weight:      Height:         Author: Yetta Blanch, MD 06/19/2024 5:50 PM  Please look on [email or web address] to find out who is on call.

## 2024-06-19 NOTE — Care Management Obs Status (Signed)
 MEDICARE OBSERVATION STATUS NOTIFICATION   Patient Details  Name: Michael Odonnell MRN: [id number] Date of Birth: 1941/03/06   Medicare Observation Status Notification Given:  Yes    Alfonse JONELLE Rex, RN 06/19/2024, 3:20 PM

## 2024-06-19 NOTE — Discharge Instructions (Signed)
 Please contact [facility] to schedule over wire catheter exchange in clinic and follow-up with Dr. Devere in approximately 4 weeks.  [facility] [address] second floor [place], [place]  [address] [phone]

## 2024-06-19 NOTE — TOC Initial Note (Addendum)
 Transition of Care [facility]) - Initial/Assessment Note    Patient Details  Name: Michael Odonnell MRN: [id number] Date of Birth: 07/17/1940  Transition of Care [facility]) CM/SW Contact:    Michael JONELLE Rex, RN Phone Number: 06/19/2024, 2:50 PM  Clinical Narrative:    Met with spouse at bedside to introduce role of INPT CM and review for dc planning. TOC consult for SNF Placement, PT eval, recommendation for SNF. Spouse states patient recently admitted to [facility], wishes to find a different SNF. States prior to recent SNF admission patient was ambulatory with his Rollator, able to get up and go to bathroom independently. FL2 updated, faxed out for bed offers.  -315pm MOON reviewed with patient's spouse, Michael Odonnell, telephonically. Copy left at bedside.                 Expected Discharge Plan: Skilled Nursing Facility Barriers to Discharge: Continued Medical Work up   Patient Goals and CMS Choice   CMS Medicare.gov Compare Post Acute Care list provided to:: Patient Represenative (must comment) (Michael Odonnell  Spouse, Emergency Contact  [phone] (Mobile)) Choice offered to / list presented to : Spouse Michael Odonnell ownership interest in [facility] [place].provided to:: Spouse    Expected Discharge Plan and Services       Living arrangements for the past 2 months: Single Family Home                                      Prior Living Arrangements/Services Living arrangements for the past 2 months: Single Family Home Lives with:: Spouse Patient language and need for interpreter reviewed:: Yes        Need for Family Participation in Patient Care: Yes (Comment) Care giver support system in place?: Yes (comment) Current home services: DME (Rollator) Criminal Activity/Legal Involvement Pertinent to Current Situation/Hospitalization: No - Comment as needed  Activities of Daily Living   ADL Screening (condition at time of admission) Independently performs ADLs?: No Does the patient  have a NEW difficulty with bathing/dressing/toileting/self-feeding that is expected to last >3 days?: Yes (Initiates electronic notice to provider for possible OT consult) Does the patient have a NEW difficulty with getting in/out of bed, walking, or climbing stairs that is expected to last >3 days?: Yes (Initiates electronic notice to provider for possible PT consult) Does the patient have a NEW difficulty with communication that is expected to last >3 days?: No Is the patient deaf or have difficulty hearing?: No Does the patient have difficulty seeing, even when wearing glasses/contacts?: No Does the patient have difficulty concentrating, remembering, or making decisions?: Yes  Permission Sought/Granted                  Emotional Assessment Appearance:: Appears stated age Attitude/Demeanor/Rapport: Unable to Assess Affect (typically observed): Unable to Assess Orientation: : Oriented to Self Alcohol / Substance Use: Not Applicable Psych Involvement: No (comment)  Admission diagnosis:  Gross hematuria [R31.0] Patient Active Problem List   Diagnosis Date Noted   Gross hematuria 06/18/2024   Acute delirium 05/28/2024   Anemia 05/28/2024   Pressure injury of skin 05/24/2024   Closed fracture of neck of left femur (HCC) 05/23/2024   Closed fracture of proximal end of left humerus 05/23/2024   Chronic anticoagulation 11/10/2019   PAF (paroxysmal atrial fibrillation) (HCC) 10/19/2019   Essential hypertension 10/19/2019   Parkinson's disease (HCC) 10/19/2019   BPH (benign prostatic hyperplasia) 10/19/2019  Nausea 11/07/2017   PCP:  Nichole Senior, MD Pharmacy:   [facility] - [place], [place] - [phone] [place] [address] Phone: [phone] Fax: [phone]     Social Drivers of Health (SDOH) Social History: SDOH Screenings   Food Insecurity: No Food Insecurity (06/18/2024)  Housing: Low Risk (06/18/2024)   Transportation Needs: No Transportation Needs (06/18/2024)  Utilities: Not At Risk (06/18/2024)  Social Connections: Patient Unable To Answer (06/18/2024)  Recent Concern: Social Connections - Moderately Isolated (05/23/2024)  Tobacco Use: Medium Risk (06/19/2024)   SDOH Interventions:     Readmission Risk Interventions     No data to display

## 2024-06-19 NOTE — Progress Notes (Addendum)
 "  1 Day Post-Op Subjective: Urinary retention necessitating SPT overnight. Pt was lethargic with questionable mentation on rounds. Reviewed case and plan with his wife.   Objective: Vital signs in last 24 hours: Temp:  [97.6 F (36.4 C)-98.2 F (36.8 C)] 97.7 F (36.5 C) (01/09 0500) Pulse Rate:  [65-85] 84 (01/09 0500) Resp:  [14-20] 16 (01/09 0500) BP: (102-136)/(43-64) 123/58 (01/09 0500) SpO2:  [98 %-100 %] 100 % (01/09 0500) Weight:  [74 kg] 74 kg (01/08 2119)  Assessment/Plan: 84 year old male with history of Parkinson's, BPH, receiving tamsulosin , finasteride , and Myrbetriq .  Difficult Foley catheter revealed obliterated membranous urethra without true lumen identified.  SPT placed by Dr. Devere 06/18/2024.  # Obliterated membranous urethra  First SPT exchange approximately 4 weeks to allow for epithelialization.  Over wire exchange in clinic after that.  Long-term SPT versus urethral reconstruction with tertiary center to be discussed in clinic.  Clear yellow urine with C/D/I SPT insertion site.  Contact information placed in discharge instructions.  Please call with questions, concerns, or acute changes  Intake/Output from previous day: 01/08 0701 - 01/09 0700 In: 500 [I.V.:400; IV Piggyback:100] Out: 290 [Urine:285; Blood:5]  Intake/Output this shift: No intake/output data recorded.  Physical Exam:  General: Alert and oriented CV: No cyanosis Lungs: equal chest rise Gu: New SPT in place.  Suture intact.  C/D/I.  Draining clear yellow urine  Lab Results: Recent Labs    06/18/24 1526 06/19/24 0401  HGB 10.8* 8.0*  HCT 34.6* 25.6*   BMET Recent Labs    06/18/24 1526 06/19/24 0401  NA 137 136  K 4.3 4.1  CL 101 104  CO2 26 23  GLUCOSE 154* 181*  BUN 16 17  CREATININE 1.12 1.15  CALCIUM  9.6 8.3*  HGB 10.8* 8.0*  WBC 11.9* 21.4*     Studies/Results: CT ABDOMEN PELVIS W CONTRAST Result Date: 06/18/2024 EXAM: CT ABDOMEN AND PELVIS WITH CONTRAST  06/18/2024 04:59:54 PM TECHNIQUE: CT of the abdomen and pelvis was performed with the administration of 100 mL of iohexol  (OMNIPAQUE ) 300 MG/ML solution. Multiplanar reformatted images are provided for review. Automated exposure control, iterative reconstruction, and/or weight-based adjustment of the mA/kV was utilized to reduce the radiation dose to as low as reasonably achievable. COMPARISON: CT abdomen 12/12/2023. CLINICAL HISTORY: Hematuria, gross/macroscopic. FINDINGS: LOWER CHEST: There is a calcified granuloma in the right lung base. There is linear atelectasis or scarring in both lung bases. LIVER: There are a few subcentimeter hypodensities in the liver which are unchanged, likely cysts or hemangiomas. GALLBLADDER AND BILE DUCTS: Gallbladder is unremarkable. No biliary ductal dilatation. SPLEEN: No acute abnormality. PANCREAS: No acute abnormality. ADRENAL GLANDS: Bilateral adrenal nodules are unchanged and were previously characterized as adenomas. KIDNEYS, URETERS AND BLADDER: Bilateral renal cysts and complex cysts appear unchanged from the prior study. There is a punctate nonobstructing left renal calculus, also unchanged. The bladder is markedly distended. No hydronephrosis. No perinephric or periureteral stranding. GI AND BOWEL: Stomach demonstrates no acute abnormality. There is a large amount of stool throughout the entire colon. There is diffuse colonic diverticulosis. The appendix appears within normal limits. The rectum is dilated and stool filled measuring up to 9 cm with presacral edema. There is no bowel obstruction. PERITONEUM AND RETROPERITONEUM: No ascites. No free air. VASCULATURE: Aorta is normal in caliber. There are atherosclerotic calcifications of the aorta and iliac arteries. LYMPH NODES: No lymphadenopathy. REPRODUCTIVE ORGANS: Prostate gland is enlarged with calcifications. BONES AND SOFT TISSUES: Left sided hip screws are present.  There is an intramuscular fluid collection lateral  to the proximal femoral diaphysis measuring 6.6 x 3.0 x 3.1 cm. No acute osseous abnormality. No focal soft tissue abnormality. IMPRESSION: 1. Markedly distended bladder, suspicious for urinary retention. 2. Fecal impaction with stercoral colitis. 3. Diffuse colonic diverticulosis without evidence of diverticulitis. 4. Moderate sized inguinal hernias, with the right inguinal hernia containing nondilated bowel. 5. Intramuscular fluid collection lateral to the proximal femoral diaphysis measuring 6.6 x 3.0 x 3.1 cm. Correlate clinically for infection. Electronically signed by: Greig Pique MD MD 06/18/2024 05:30 PM EST RP Workstation: [id number]      LOS: 0 days   Ole Bourdon, NP [facility] Pager: [phone]  06/19/2024, 10:54 AM  "

## 2024-06-19 NOTE — Plan of Care (Signed)
Plan of care initiated and reviewed.

## 2024-06-19 NOTE — [place] FL2 (Signed)
 " [place]  MEDICAID FL2 LEVEL OF CARE FORM     IDENTIFICATION  Patient Name: Michael Odonnell Birthdate: 08/16/1940 Sex: male Admission Date (Current Location): 06/18/2024  [facility] and [place] Number:  Producer, Television/film/video and Address:  [employer],  [address], [place] [address]      Provider Number: [id number]  Attending Physician Name and Address:  Tobie Yetta HERO, MD  Relative Name and Phone Number:  lelend, [place], Emergency Contact  [phone] [facility])    Current Level of Care: Hospital Recommended Level of Care: Skilled Nursing Facility Prior Approval Number:    Date Approved/Denied:   PASRR Number: [id number]  Discharge Plan: SNF    Current Diagnoses: Patient Active Problem List   Diagnosis Date Noted   Gross hematuria 06/18/2024   Acute delirium 05/28/2024   Anemia 05/28/2024   Pressure injury of skin 05/24/2024   Closed fracture of neck of left femur (HCC) 05/23/2024   Closed fracture of proximal end of left humerus 05/23/2024   Chronic anticoagulation 11/10/2019   PAF (paroxysmal atrial fibrillation) (HCC) 10/19/2019   Essential hypertension 10/19/2019   Parkinson's disease (HCC) 10/19/2019   BPH (benign prostatic hyperplasia) 10/19/2019   Nausea 11/07/2017    Orientation RESPIRATION BLADDER Height & Weight        O2 Indwelling catheter (suprapubic) Weight: 74 kg Height:  5' 9 (175.3 cm)  BEHAVIORAL SYMPTOMS/MOOD NEUROLOGICAL BOWEL NUTRITION STATUS      Incontinent Diet (regular)  AMBULATORY STATUS COMMUNICATION OF NEEDS Skin   Extensive Assist Verbally Surgical wounds (Ecchymosis L shoulder, scrotum wound, Stage 1 Pressure injury sacrum , Left hip incision wound)                       Personal Care Assistance Level of Assistance  Bathing, Feeding, Dressing Bathing Assistance: Maximum assistance Feeding assistance: Limited assistance Dressing Assistance: Maximum assistance Total Care Assistance: Maximum assistance    Functional Limitations Info  Sight, Hearing, Speech Sight Info: Impaired (glasses) Hearing Info: Adequate Speech Info: Adequate    SPECIAL CARE FACTORS FREQUENCY  PT (By licensed PT), OT (By licensed OT)     PT Frequency: 5x/wk OT Frequency: 5x/wk            Contractures Contractures Info: Not present    Additional Factors Info  Code Status, Allergies, Psychotropic Code Status Info: Full Code Allergies Info: Hydrochlorothiazide, Codeine, Doxycycline, Metronidazole, Mysoline (Primidone) Psychotropic Info: N/A         Current Medications (06/19/2024):  This is the current hospital active medication list Current Facility-Administered Medications  Medication Dose Route Frequency Provider Last Rate Last Admin   acetaminophen  (TYLENOL ) tablet 650 mg  650 mg Oral Q6H PRN Zella, Mir M, MD       Or   acetaminophen  (TYLENOL ) suppository 650 mg  650 mg Rectal Q6H PRN Zella, Mir M, MD       albuterol  (PROVENTIL ) (2.5 MG/3ML) 0.083% nebulizer solution 2.5 mg  2.5 mg Nebulization Q2H PRN Zella, Mir M, MD       carbidopa -levodopa  (SINEMET  IR) 25-100 MG per tablet immediate release 1 tablet  1 tablet Oral 3 times per day Winter, Christopher Aaron, MD   1 tablet at 06/19/24 1402   carbidopa -levodopa  (SINEMET  IR) 25-100 MG per tablet immediate release 2 tablet  2 tablet Oral Q0600 Zella, Mir M, MD       cefTRIAXone  (ROCEPHIN ) 1 g in sodium chloride  0.9 % 100 mL IVPB  1 g Intravenous Q24H  Zella Katha HERO, MD   Stopped at 06/19/24 9485   Chlorhexidine  Gluconate Cloth 2 % PADS 6 each  6 each Topical Daily Devere Lonni Righter, MD   6 each at 06/19/24 1100   diltiazem  (CARDIZEM  CD) 24 hr capsule 360 mg  360 mg Oral Daily Zella, Mir M, MD   360 mg at 06/19/24 1050   finasteride  (PROSCAR ) tablet 5 mg  5 mg Oral Q1500 Devere Lonni Righter, MD   5 mg at 06/19/24 1050   HYDROmorphone  (DILAUDID ) injection 0.5-1 mg  0.5-1 mg Intravenous Q2H PRN Zella Katha HERO, MD        magnesium  oxide (MAG-OX) tablet 400 mg  400 mg Oral Daily Winter, Christopher Aaron, MD   400 mg at 06/19/24 1050   mirabegron  ER (MYRBETRIQ ) tablet 50 mg  50 mg Oral q1600 Devere Lonni Righter, MD       multivitamin with minerals tablet 1 tablet  1 tablet Oral QHS Winter, Christopher Aaron, MD       ondansetron  (ZOFRAN ) tablet 4 mg  4 mg Oral Q6H PRN Zella, Mir M, MD       Or   ondansetron  (ZOFRAN ) injection 4 mg  4 mg Intravenous Q6H PRN Zella, Mir M, MD       oxyCODONE  (Oxy IR/ROXICODONE ) immediate release tablet 5 mg  5 mg Oral Q4H PRN Zella, Mir M, MD       polyethylene glycol (MIRALAX  / GLYCOLAX ) packet 17 g  17 g Oral BID Patel, Pranav M, MD   17 g at 06/19/24 1100   senna-docusate (Senokot-S) tablet 2 tablet  2 tablet Oral BID Patel, Pranav M, MD   2 tablet at 06/19/24 1050   sodium chloride  irrigation 0.9 % 3,000 mL  3,000 mL Irrigation Continuous Winter, Christopher Aaron, MD       tamsulosin  (FLOMAX ) capsule 0.4 mg  0.4 mg Oral Daily Winter, Christopher Aaron, MD   0.4 mg at 06/19/24 1050   trandolapril  (MAVIK ) tablet 4 mg  4 mg Oral Daily Winter, Christopher Aaron, MD   4 mg at 06/19/24 1050     Discharge Medications: Please see discharge summary for a list of discharge medications.  Relevant Imaging Results:  Relevant Lab Results:   Additional Information SSN: [id number]  Alfonse JONELLE Rex, RN     "

## 2024-06-20 ENCOUNTER — Observation Stay (HOSPITAL_COMMUNITY)

## 2024-06-20 DIAGNOSIS — R310 Gross hematuria: Secondary | ICD-10-CM | POA: Diagnosis not present

## 2024-06-20 LAB — BASIC METABOLIC PANEL WITH GFR
Anion gap: 7 (ref 5–15)
BUN: 19 mg/dL (ref 8–23)
CO2: 25 mmol/L (ref 22–32)
Calcium: 8.3 mg/dL — ABNORMAL LOW (ref 8.9–10.3)
Chloride: 103 mmol/L (ref 98–111)
Creatinine, Ser: 1.04 mg/dL (ref 0.61–1.24)
GFR, Estimated: >60 mL/min
Glucose, Bld: 160 mg/dL — ABNORMAL HIGH (ref 70–99)
Potassium: 4.1 mmol/L (ref 3.5–5.1)
Sodium: 135 mmol/L (ref 135–145)

## 2024-06-20 LAB — CBC
HCT: 24.8 % — ABNORMAL LOW (ref 39.0–52.0)
Hemoglobin: 7.9 g/dL — ABNORMAL LOW (ref 13.0–17.0)
MCH: 30.2 pg (ref 26.0–34.0)
MCHC: 31.9 g/dL (ref 30.0–36.0)
MCV: 94.7 fL (ref 80.0–100.0)
Platelets: 228 K/uL (ref 150–400)
RBC: 2.62 MIL/uL — ABNORMAL LOW (ref 4.22–5.81)
RDW: 15 % (ref 11.5–15.5)
WBC: 16.7 K/uL — ABNORMAL HIGH (ref 4.0–10.5)
nRBC: 0 % (ref 0.0–0.2)

## 2024-06-20 MED ORDER — MINERAL OIL RE ENEM
1.0000 | ENEMA | Freq: Once | RECTAL | Status: AC
  Administered 2024-06-20: 1 via RECTAL
  Filled 2024-06-20: qty 1

## 2024-06-20 MED ORDER — FLEET ENEMA RE ENEM
1.0000 | ENEMA | Freq: Once | RECTAL | Status: AC
  Administered 2024-06-20: 1 via RECTAL
  Filled 2024-06-20: qty 1

## 2024-06-20 MED ORDER — CEFADROXIL 500 MG PO CAPS
500.0000 mg | ORAL_CAPSULE | Freq: Two times a day (BID) | ORAL | Status: AC
  Administered 2024-06-20 – 2024-06-21 (×4): 500 mg via ORAL
  Filled 2024-06-20 (×4): qty 1

## 2024-06-20 MED ORDER — APIXABAN 5 MG PO TABS
5.0000 mg | ORAL_TABLET | Freq: Two times a day (BID) | ORAL | Status: DC
  Administered 2024-06-20 – 2024-06-25 (×11): 5 mg via ORAL
  Filled 2024-06-20 (×11): qty 1

## 2024-06-20 NOTE — Progress Notes (Signed)
 [facility] Progress Note Patient: Michael Odonnell FMW:[id number] DOB: 11/17/1940  DOA: 06/18/2024 DOS: the patient was seen and examined on 06/20/2024  Brief Hospital Course: Patient with PMH of chronic A-fib on Eliquis , HTN, Parkinson's disease, recent hip and humerus fracture, HTN, GERD presented to hospital with complaints of difficulty urination.   Patient is from facility.  They had difficulty urination and facility attempted to place a catheter.  Upon removal of the Foley catheter at the facility they did notice some blood and patient was brought to the hospital for further workup.  Urology was consulted.  Unsuccessful bedside attempt likely to false passage.  Underwent urgent cystoscopy but unable to pass a guidewire following which decision was made to place a suprapubic catheter. Assessment and Plan: Gross hematuria In the setting of traumatic Foley catheter insertion. Appreciate urology assistance. Underwent cystoscopy and suprapubic catheter placement. Currently on IV antibiotic.  Switch to oral.   Chronic A-fib Hematuria Patient on Eliquis  at home for A-fib. Also on Cardizem . Eliquis  on hold at admission.  Now resuming and will monitor.   HTN. On Cardizem . Continue with same medication for now.   Parkinson's disease. Patient sees PT OT and SLP at the facility. Continuing Sinemet . Monitor for improvement in oral intake.   Left hip fracture status post ORIF 05/23/2024 WBAT.  Follow-up with Dr. Georgina in the office in 4 weeks.   Left proximal humerus fracture Continue left arm sling.  Nonweightbearing left upper extremity.  Rectal impaction. X-ray shows evidence of ongoing rectal impaction but Will continue with enema. Continue with aggressive bowel regimen.   Subjective: Alert awake and oriented.  Had a BM last night.  No nausea no vomiting.  Physical Exam: Clear to auscultation. S1-S2 present. Bowel sound present No edema. Urine clear still.  Data  Reviewed: I have Reviewed nursing notes, Vitals, and Lab results. Since last encounter, pertinent lab results CBC and BMP   . I have ordered test including CBC and BMP  .   Disposition: Status is: Observation will await placement.  SCDs Start: 06/18/24 2127 apixaban  (ELIQUIS ) tablet 5 mg   Family Communication: Family at bedside Level of care: Med-Surg   Vitals:   06/19/24 2110 06/20/24 0642 06/20/24 1040 06/20/24 1250  BP: (!) 118/52 (!) 115/48 (!) 117/52 132/60  Pulse: 71 65  68  Resp: 14 15  18   Temp: 98.7 F (37.1 C) 98.1 F (36.7 C)  98.5 F (36.9 C)  TempSrc: Oral Oral  Oral  SpO2: 96% 99%  99%  Weight:      Height:         Author: Yetta Blanch, MD 06/20/2024 6:16 PM  Please look on [email or web address] to find out who is on call.

## 2024-06-20 NOTE — Hospital Course (Signed)
 Patient with PMH of chronic A-fib on Eliquis , HTN, Parkinson's disease, recent hip and humerus fracture, HTN, GERD presented to hospital with complaints of difficulty urination.   Patient is from facility.  They had difficulty urination and facility attempted to place a catheter.  Upon removal of the Foley catheter at the facility they did notice some blood and patient was brought to the hospital for further workup.  Urology was consulted.  Unsuccessful bedside attempt likely to false passage.  Underwent urgent cystoscopy but unable to pass a guidewire following which decision was made to place a suprapubic catheter. Assessment and Plan: Gross hematuria In the setting of traumatic Foley catheter insertion. Appreciate urology assistance. Underwent cystoscopy and suprapubic catheter placement. Was on IV antibiotic.  Currently on oral.  Staff reported some drop of blood at the meatus.    Chronic A-fib Hematuria Patient on Eliquis  at home for A-fib. Also on Cardizem . Eliquis  was on hold at admission.  Now resuming and will monitor.   HTN. On Cardizem . Continue with same medication for now.   Parkinson's disease. Patient sees PT OT and SLP at the facility. Continuing Sinemet . Monitor for improvement in oral intake.   Left hip fracture status post ORIF 05/23/2024 WBAT.  Follow-up with Dr. Georgina in the office in 4 weeks.   Left proximal humerus fracture Continue left arm sling.  Nonweightbearing left upper extremity.  Rectal impaction. X-ray shows evidence of ongoing rectal impaction but Will continue with enema. Continue with aggressive bowel regimen.

## 2024-06-21 DIAGNOSIS — R310 Gross hematuria: Secondary | ICD-10-CM | POA: Diagnosis not present

## 2024-06-21 LAB — CBC
HCT: 26.2 % — ABNORMAL LOW (ref 39.0–52.0)
Hemoglobin: 8.4 g/dL — ABNORMAL LOW (ref 13.0–17.0)
MCH: 30 pg (ref 26.0–34.0)
MCHC: 32.1 g/dL (ref 30.0–36.0)
MCV: 93.6 fL (ref 80.0–100.0)
Platelets: 243 K/uL (ref 150–400)
RBC: 2.8 MIL/uL — ABNORMAL LOW (ref 4.22–5.81)
RDW: 14.6 % (ref 11.5–15.5)
WBC: 10.9 K/uL — ABNORMAL HIGH (ref 4.0–10.5)
nRBC: 0 % (ref 0.0–0.2)

## 2024-06-21 LAB — MAGNESIUM: Magnesium: 2.5 mg/dL — ABNORMAL HIGH (ref 1.7–2.4)

## 2024-06-21 LAB — BASIC METABOLIC PANEL WITH GFR
Anion gap: 7 (ref 5–15)
BUN: 12 mg/dL (ref 8–23)
CO2: 27 mmol/L (ref 22–32)
Calcium: 9.2 mg/dL (ref 8.9–10.3)
Chloride: 102 mmol/L (ref 98–111)
Creatinine, Ser: 0.76 mg/dL (ref 0.61–1.24)
GFR, Estimated: >60 mL/min
Glucose, Bld: 120 mg/dL — ABNORMAL HIGH (ref 70–99)
Potassium: 3.9 mmol/L (ref 3.5–5.1)
Sodium: 136 mmol/L (ref 135–145)

## 2024-06-21 MED ORDER — LORATADINE 10 MG PO TABS
10.0000 mg | ORAL_TABLET | Freq: Every day | ORAL | Status: DC
  Administered 2024-06-21 – 2024-06-25 (×5): 10 mg via ORAL
  Filled 2024-06-21 (×5): qty 1

## 2024-06-21 MED ORDER — SALINE SPRAY 0.65 % NA SOLN: 1.0000 | NASAL | Status: DC | PRN

## 2024-06-21 NOTE — Progress Notes (Signed)
 [facility] Progress Note Patient: Michael Odonnell FMW:[id number] DOB: 1940-07-21  DOA: 06/18/2024 DOS: the patient was seen and examined on 06/21/2024  Brief Hospital Course: Patient with PMH of chronic A-fib on Eliquis , HTN, Parkinson's disease, recent hip and humerus fracture, HTN, GERD presented to hospital with complaints of difficulty urination.   Patient is from facility.  They had difficulty urination and facility attempted to place a catheter.  Upon removal of the Foley catheter at the facility they did notice some blood and patient was brought to the hospital for further workup.  Urology was consulted.  Unsuccessful bedside attempt likely to false passage.  Underwent urgent cystoscopy but unable to pass a guidewire following which decision was made to place a suprapubic catheter. Assessment and Plan: Gross hematuria In the setting of traumatic Foley catheter insertion. Appreciate urology assistance. Underwent cystoscopy and suprapubic catheter placement. Was on IV antibiotic.  Currently on oral.  Staff reported some drop of blood at the meatus.    Chronic A-fib Hematuria Patient on Eliquis  at home for A-fib. Also on Cardizem . Eliquis  was on hold at admission.  Now resuming and will monitor.   HTN. On Cardizem . Continue with same medication for now.   Parkinson's disease. Patient sees PT OT and SLP at the facility. Continuing Sinemet . Monitor for improvement in oral intake.   Left hip fracture status post ORIF 05/23/2024 WBAT.  Follow-up with Dr. Georgina in the office in 4 weeks.   Left proximal humerus fracture Continue left arm sling.  Nonweightbearing left upper extremity.  Rectal impaction. X-ray shows evidence of ongoing rectal impaction but Will continue with enema. Continue with aggressive bowel regimen.   Subjective: No nausea no vomiting.  No fever no chills.  Reported some blood present at the tip of the meatus.  Wife reports an earache.  Physical  Exam: Clear to auscultation. S1-S2 present. Bowel sounds present. No edema. Oral mucosa clear.  No edema.  No redness.  Data Reviewed: I have Reviewed nursing notes, Vitals, and Lab results. Since last encounter, pertinent lab results CBC and BMP   . I have ordered test including CBC and BMP  .   Disposition: Status is: Observation  SCDs Start: 06/18/24 2127 apixaban  (ELIQUIS ) tablet 5 mg   Family Communication: Family at bedside Level of care: Med-Surg   Vitals:   06/20/24 1250 06/20/24 2206 06/21/24 0527 06/21/24 1327  BP: 132/60 (!) 143/56 (!) 152/54 130/60  Pulse: 68 68 64 63  Resp: 18   20  Temp: 98.5 F (36.9 C) 97.8 F (36.6 C) 97.9 F (36.6 C) 97.8 F (36.6 C)  TempSrc: Oral Oral Oral Oral  SpO2: 99% 100% 100% 95%  Weight:      Height:         Author: Yetta Blanch, MD 06/21/2024 5:00 PM  Please look on [email or web address] to find out who is on call.

## 2024-06-21 NOTE — Evaluation (Signed)
 Occupational Therapy Evaluation Patient Details Name: Michael Odonnell MRN: [id number] DOB: 11/07/1940 Today's Date: 06/21/2024   History of Present Illness   Pt admitted from [facility] 2* hematuria and now S/P suprapubic cath placement.  Pt in [facility] for rehab followin L hip fx (s/p pinning) and L humeral fx ( nonoperative).  Pt with hx of Parkinsons, sciatica and a-fib on Eliquos.     Clinical Impressions Pt initially appeared confused however excellent participation as session progressed with pt assisting to mobilize to EOB with mod A. Overall Max A with ADL duet o below listed deficits. Repetitive movement patterns work well due to parkinson's. Patient will benefit from continued inpatient follow up therapy, <3 hours/day to maximize functional level of independence. Acute OT to follow.      If plan is discharge home, recommend the following:   Two people to help with walking and/or transfers;Two people to help with bathing/dressing/bathroom;Assistance with feeding;Assist for transportation     Functional Status Assessment   Patient has had a recent decline in their functional status and demonstrates the ability to make significant improvements in function in a reasonable and predictable amount of time.     Equipment Recommendations   Other (comment) (defer to next level of care)     Recommendations for Other Services         Precautions/Restrictions   Precautions Precautions: Fall Recall of Precautions/Restrictions: Impaired Precaution/Restrictions Comments: hx parkinson's Required Braces or Orthoses: Sling Restrictions Weight Bearing Restrictions Per Provider Order: Yes LUE Weight Bearing Per Provider Order: Non weight bearing LLE Weight Bearing Per Provider Order: Weight bearing as tolerated     Mobility Bed Mobility Overal bed mobility: Needs Assistance Bed Mobility: Supine to Sit, Sit to Supine     Supine to sit: Mod assist Sit to supine:  Mod assist   General bed mobility comments: repetitive movement patterns used with pt eventually able to progress leges off bed by himself; using RUE to push up with bed rail    Transfers                   General transfer comment: NA - will need + 2      Balance Overall balance assessment: Needs assistance, History of Falls   Sitting balance-Leahy Scale: Fair                                     ADL either performed or assessed with clinical judgement   ADL Overall ADL's : Needs assistance/impaired Eating/Feeding: Set up   Grooming: Minimal assistance;Sitting   Upper Body Bathing: Moderate assistance;Bed level   Lower Body Bathing: Maximal assistance   Upper Body Dressing : Maximal assistance   Lower Body Dressing: Total assistance       Toileting- Clothing Manipulation and Hygiene: Total assistance (suprapubic catheter)         General ADL Comments: encouraged wife to allow patient to feed self     Vision Baseline Vision/History: 1 Wears glasses Ability to See in Adequate Light: 0 Adequate Patient Visual Report: No change from baseline       Perception         Praxis         Pertinent Vitals/Pain Pain Assessment Pain Assessment: Faces Faces Pain Scale: Hurts a little bit Pain Location: L UE>L LE Pain Descriptors / Indicators: Aching, Discomfort, Grimacing Pain Intervention(s): Limited activity within patient's tolerance  Extremity/Trunk Assessment Upper Extremity Assessment Upper Extremity Assessment: Generalized weakness;Left hand dominant;LUE deficits/detail LUE Deficits / Details: L proximal humerus fx, currently in sling to mobilize LUE Coordination: decreased fine motor;decreased gross motor   Lower Extremity Assessment Lower Extremity Assessment: Defer to PT evaluation (moving LLE well, able to progress leg off/on bed)   Cervical / Trunk Assessment Cervical / Trunk Assessment: Kyphotic (forward head)    Communication Communication Communication: Impaired Factors Affecting Communication: Difficulty expressing self (due to parkinson's)   Cognition Arousal: Alert (but rousable) Behavior During Therapy: WFL for tasks assessed/performed Cognition: Cognition impaired, No family/caregiver present to determine baseline     Awareness: Intellectual awareness impaired, Online awareness impaired Memory impairment (select all impairments): Working memory Attention impairment (select first level of impairment): Selective attention Executive functioning impairment (select all impairments): Organization, Sequencing, Reasoning, Problem solving OT - Cognition Comments: per wife, pt is typically intact cognitively; pt initiallyhallucinating, seeing chairs at the end of his bed; as session progressed, pt more appropriate                 Following commands: Impaired       Cueing  General Comments   Cueing Techniques: Verbal cues;Gestural cues;Visual cues;Tactile cues   Likes jazz music   Exercises Exercises: General Upper Extremity, General Lower Extremity General Exercises - Upper Extremity Shoulder Flexion: AROM, Right, 10 reps Shoulder Extension: AROM, Right, 10 reps General Exercises - Lower Extremity Short Arc Quad: Both, 10 reps, Seated Heel Slides: Both, 20 reps, Supine Hip ABduction/ADduction: Both, 20 reps, Supine Straight Leg Raises: Both, 10 reps, Supine Hip Flexion/Marching: Both, 20 reps, Seated   Shoulder Instructions      Home Living Family/patient expects to be discharged to:: Skilled nursing facility                                        Prior Functioning/Environment Prior Level of Function : Needs assist;History of Falls (last six months)             Mobility Comments: Normally uses rollator - pt vague regarding mobility as rehab facility ADLs Comments: assist for UB dressing and IADLs    OT Problem List: Decreased strength;Decreased  range of motion;Decreased activity tolerance;Impaired balance (sitting and/or standing);Decreased cognition;Decreased safety awareness;Decreased knowledge of use of DME or AE;Decreased knowledge of precautions;Impaired UE functional use;Decreased coordination;Pain;Increased edema   OT Treatment/Interventions: Self-care/ADL training;Therapeutic exercise;DME and/or AE instruction;Therapeutic activities;Patient/family education;Balance training;Cognitive remediation/compensation      OT Goals(Current goals can be found in the care plan section)   Acute Rehab OT Goals Patient Stated Goal: get better OT Goal Formulation: With patient/family Time For Goal Achievement: 07/05/24 Potential to Achieve Goals: Fair   OT Frequency:  Min 2X/week    Co-evaluation PT/OT/SLP Co-Evaluation/Treatment: Yes            AM-PAC OT 6 Clicks Daily Activity     Outcome Measure Help from another person eating meals?: A Little Help from another person taking care of personal grooming?: A Little Help from another person toileting, which includes using toliet, bedpan, or urinal?: Total Help from another person bathing (including washing, rinsing, drying)?: A Lot Help from another person to put on and taking off regular upper body clothing?: A Lot Help from another person to put on and taking off regular lower body clothing?: Total 6 Click Score: 12   End of Session Equipment Utilized During Treatment:  Other (comment) Nurse Communication: Mobility status;Need for lift equipment  Activity Tolerance: Patient tolerated treatment well Patient left: with family/visitor present;in bed;with bed alarm set (chair position)  OT Visit Diagnosis: Unsteadiness on feet (R26.81);Other abnormalities of gait and mobility (R26.89);Muscle weakness (generalized) (M62.81)                Time: 8351-8286 OT Time Calculation (min): 25 min Charges:  OT General Charges $OT Visit: 1 Visit OT Evaluation $OT Eval Moderate  Complexity: 1 Mod OT Treatments $Therapeutic Activity: 8-22 mins  Kreg Sink, OT/L   Acute OT Clinical Specialist Acute Rehabilitation Services Pager [phone] Office [phone]   [facility] 06/21/2024, 6:05 PM

## 2024-06-21 NOTE — Plan of Care (Signed)
" °  Problem: Education: Goal: Knowledge of General Education information will improve Description: Including pain rating scale, medication(s)/side effects and non-pharmacologic comfort measures Outcome: Progressing   Problem: Skin Integrity: Goal: Risk for impaired skin integrity will decrease Outcome: Progressing   Problem: Urinary Elimination: Goal: Will remain free from infection Outcome: Progressing   "

## 2024-06-22 DIAGNOSIS — R310 Gross hematuria: Secondary | ICD-10-CM | POA: Diagnosis not present

## 2024-06-22 LAB — BASIC METABOLIC PANEL WITH GFR
Anion gap: 10 (ref 5–15)
BUN: 12 mg/dL (ref 8–23)
CO2: 26 mmol/L (ref 22–32)
Calcium: 9.2 mg/dL (ref 8.9–10.3)
Chloride: 102 mmol/L (ref 98–111)
Creatinine, Ser: 0.8 mg/dL (ref 0.61–1.24)
GFR, Estimated: >60 mL/min
Glucose, Bld: 117 mg/dL — ABNORMAL HIGH (ref 70–99)
Potassium: 3.8 mmol/L (ref 3.5–5.1)
Sodium: 138 mmol/L (ref 135–145)

## 2024-06-22 LAB — CBC
HCT: 27.6 % — ABNORMAL LOW (ref 39.0–52.0)
Hemoglobin: 8.9 g/dL — ABNORMAL LOW (ref 13.0–17.0)
MCH: 29.8 pg (ref 26.0–34.0)
MCHC: 32.2 g/dL (ref 30.0–36.0)
MCV: 92.3 fL (ref 80.0–100.0)
Platelets: 252 K/uL (ref 150–400)
RBC: 2.99 MIL/uL — ABNORMAL LOW (ref 4.22–5.81)
RDW: 14.5 % (ref 11.5–15.5)
WBC: 7.7 K/uL (ref 4.0–10.5)
nRBC: 0 % (ref 0.0–0.2)

## 2024-06-22 LAB — MAGNESIUM: Magnesium: 2.4 mg/dL (ref 1.7–2.4)

## 2024-06-22 MED ORDER — CEFADROXIL 500 MG PO CAPS
500.0000 mg | ORAL_CAPSULE | Freq: Two times a day (BID) | ORAL | Status: AC
  Administered 2024-06-22 (×2): 500 mg via ORAL
  Filled 2024-06-22 (×2): qty 1

## 2024-06-22 NOTE — Progress Notes (Signed)
 [facility] Progress Note Patient: Michael Odonnell FMW:[id number] DOB: 1941/06/05  DOA: 06/18/2024 DOS: the patient was seen and examined on 06/22/2024  Brief Hospital Course: Patient with PMH of chronic A-fib on Eliquis , HTN, Parkinson's disease, recent hip and humerus fracture, HTN, GERD presented to hospital with complaints of difficulty urination.   Patient is from facility.  They had difficulty urination and facility attempted to place a catheter.  Upon removal of the Foley catheter at the facility they did notice some blood and patient was brought to the hospital for further workup.  Urology was consulted.  Unsuccessful bedside attempt likely to false passage.  Underwent urgent cystoscopy but unable to pass a guidewire following which decision was made to place a suprapubic catheter.  Assessment and Plan: Gross hematuria In the setting of traumatic Foley catheter insertion. Appreciate urology assistance. Underwent cystoscopy and suprapubic catheter placement. Was on IV antibiotic.  Currently on oral.  Staff reported some drop of blood at the meatus.    Chronic A-fib Hematuria Patient on Eliquis  at home for A-fib. Also on Cardizem . Eliquis  was on hold at admission.  Now resuming and will monitor.   HTN. On Cardizem . Continue with same medication for now.   Parkinson's disease. Patient sees PT OT and SLP at the facility. Continuing Sinemet . Monitor for improvement in oral intake.   Left hip fracture status post ORIF 05/23/2024 WBAT.  Follow-up with Dr. Georgina in the office in 4 weeks.   Left proximal humerus fracture Continue left arm sling.  Nonweightbearing left upper extremity.  Rectal impaction. X-ray shows evidence of ongoing rectal impaction Continue with aggressive bowel regimen.   Subjective: Earache is resolved.  No nausea no vomiting.  No bleeding seen.  Physical Exam: Clear to auscultation. S1-S2 present present bowel sound \ No edema.  Data Reviewed: I  have Reviewed nursing notes, Vitals, and Lab results. Since last encounter, pertinent lab results CBC BMP   . I have ordered test including CBC  .   Disposition: Status is: Observation  SCDs Start: 06/18/24 2127 apixaban  (ELIQUIS ) tablet 5 mg   Family Communication: Monitor for placement Level of care: Med-Surg   Vitals:   06/21/24 1327 06/21/24 2215 06/22/24 0631 06/22/24 1323  BP: 130/60 136/62 (!) 151/63 (!) 126/55  Pulse: 63 63 68 66  Resp: 20 19 19 16   Temp: 97.8 F (36.6 C) 98.1 F (36.7 C) 97.6 F (36.4 C) 98 F (36.7 C)  TempSrc: Oral Oral Oral Oral  SpO2: 95% 98% 93% 95%  Weight:      Height:         Author: Yetta Blanch, MD 06/22/2024 6:16 PM  Please look on [email or web address] to find out who is on call.

## 2024-06-22 NOTE — Plan of Care (Signed)

## 2024-06-22 NOTE — Progress Notes (Signed)
 Physical Therapy Treatment Patient Details Name: Michael Odonnell MRN: [id number] DOB: July 22, 1940 Today's Date: 06/22/2024   History of Present Illness Pt admitted on 06/18/24 from [facility] SN[facility] 2* hematuria and now S/P suprapubic cath placement.  Pt in [facility] for rehab followin L hip fx (s/p pinning) and L humeral fx ( nonoperative) in December.  Pt with hx of Parkinsons, sciatica and a-fib on Eliquos.    PT Comments  Pt with good effort and making good progress today.  He started needing CGA/min A but did fatigue easily requiring mod A and +2 for safety to complete later transfers.  Pt did ambulate 4' with hemiwalker and mod A. Nursing reports that wife had expressed interest in possible return home at d/c.  Began discussing would benefit from w/c or transport chair and hemiwalker vs quad cane for mobility and 24 hr support.  However, as pt fatigued and needing more assist throughout session - they are agreeable with return to SNF.     If plan is discharge home, recommend the following: A lot of help with walking and/or transfers;A lot of help with bathing/dressing/bathroom;Assist for transportation;Help with stairs or ramp for entrance   Can travel by private vehicle     No  Equipment Recommendations  BSC/3in1;Hospital bed;Wheelchair (measurements PT) (w/c vs transport chair)    Recommendations for Other Services       Precautions / Restrictions Precautions Precautions: Fall Recall of Precautions/Restrictions: Impaired Required Braces or Orthoses: Sling Restrictions LUE Weight Bearing Per Provider Order: Non weight bearing LLE Weight Bearing Per Provider Order: Weight bearing as tolerated     Mobility  Bed Mobility Overal bed mobility: Needs Assistance Bed Mobility: Supine to Sit     Supine to sit: Min assist     General bed mobility comments: Pt with good effort.  He was able to get legs off bed with cues and get onto R side.  Pt not able to push up with R UE requiring  min A    Transfers Overall transfer level: Needs assistance Equipment used: Hemi-walker Transfers: Sit to/from Stand, Bed to chair/wheelchair/BSC Sit to Stand: Min assist, Mod assist   Step pivot transfers: Min assist, Mod assist, +2 safety/equipment       General transfer comment: Pt stood from bed x 2, bsc x 2, and bed x 1.  He started off needing light min A to steady for first 3 stands but then fatigued needing  min-mod A for remainder. Pt initially started ambulating but then needing BSC so sat.  After having BM pt required assist with cleaning with rest breaks and returned to sitting.  Attempted to then ambulate to chair but pt fatigued requiring pivot to bed then pivot to chair.    Ambulation/Gait Ambulation/Gait assistance: Mod assist, +2 safety/equipment Gait Distance (Feet): 4 Feet Assistive device: Hemi-walker Gait Pattern/deviations: Step-to pattern, Decreased stride length, Trunk flexed Gait velocity: decreased     General Gait Details: Initially pt ambulating 4' requiring min A for balance but also cues and assist to manage hemiwalker.  Pt initially supporting weight but with fatigued needed further assistance   Stairs             Wheelchair Mobility     Tilt Bed    Modified Rankin (Stroke Patients Only)       Balance Overall balance assessment: Needs assistance, History of Falls Sitting-balance support: No upper extremity supported Sitting balance-Leahy Scale: Fair     Standing balance support: Single extremity supported, During functional  activity Standing balance-Leahy Scale: Poor Standing balance comment: Initially steady with hemiwalker, with fatigue needs min A                            Communication Communication Factors Affecting Communication: Difficulty expressing self;Hearing impaired  Cognition Arousal: Alert Behavior During Therapy: WFL for tasks assessed/performed   PT - Cognitive impairments: Memory, Attention,  Initiation, Sequencing, Problem solving, Safety/Judgement                       PT - Cognition Comments: Cues for safety, sequencing, and not pushing with L UE   Following commands impaired: Only follows one step commands consistently, Follows one step commands with increased time    Cueing    Exercises General Exercises - Lower Extremity Ankle Circles/Pumps: AROM, Both, 10 reps, Supine Quad Sets: AROM, Both, 10 reps, Supine Long Arc Quad: AROM, Both, 10 reps, Seated Heel Slides: Both, Supine, 10 reps, AROM    General Comments General comments (skin integrity, edema, etc.): wife present      Pertinent Vitals/Pain Pain Assessment Pain Assessment: Faces Faces Pain Scale: Hurts a little bit Pain Location: L UE>L LE Pain Descriptors / Indicators: Aching, Discomfort, Grimacing Pain Intervention(s): Monitored during session, Limited activity within patient's tolerance, Premedicated before session, Repositioned    Home Living                          Prior Function            PT Goals (current goals can now be found in the care plan section) Progress towards PT goals: Progressing toward goals    Frequency    Min 2X/week      PT Plan      Co-evaluation              AM-PAC PT 6 Clicks Mobility   Outcome Measure  Help needed turning from your back to your side while in a flat bed without using bedrails?: A Lot Help needed moving from lying on your back to sitting on the side of a flat bed without using bedrails?: A Lot Help needed moving to and from a bed to a chair (including a wheelchair)?: A Lot Help needed standing up from a chair using your arms (e.g., wheelchair or bedside chair)?: A Lot Help needed to walk in hospital room?: Total Help needed climbing 3-5 steps with a railing? : Total 6 Click Score: 10    End of Session Equipment Utilized During Treatment: Gait belt Activity Tolerance: Patient tolerated treatment well Patient  left: with chair alarm set;in chair;with call bell/phone within reach Nurse Communication: Mobility status;Other (comment) (+2 pivot for safety , had BM) PT Visit Diagnosis: Muscle weakness (generalized) (M62.81);History of falling (Z91.81)     Time: 1350-1425 PT Time Calculation (min) (ACUTE ONLY): 35 min  Charges:    $Therapeutic Exercise: 8-22 mins $Therapeutic Activity: 8-22 mins PT General Charges $$ ACUTE PT VISIT: 1 Visit                     Benjiman, PT Acute Rehab [facility] [phone]    Benjiman VEAR Mulberry 06/22/2024, 2:56 PM

## 2024-06-22 NOTE — TOC Progression Note (Signed)
 Transition of Care [facility]) - Progression Note    Patient Details  Name: Michael Odonnell MRN: [id number] Date of Birth: 05/05/1941  Transition of Care [facility]) CM/SW Contact  Alfonse JONELLE Rex, RN Phone Number: 06/22/2024, 11:17 AM  Clinical Narrative:   No SNF bed offers at this time, faxed out for extended bed offers, team notified.     Expected Discharge Plan: Skilled Nursing Facility Barriers to Discharge: No SNF bed               Expected Discharge Plan and Services       Living arrangements for the past 2 months: Single Family Home                                       Social Drivers of Health (SDOH) Interventions SDOH Screenings   Food Insecurity: No Food Insecurity (06/18/2024)  Housing: Low Risk (06/18/2024)  Transportation Needs: No Transportation Needs (06/18/2024)  Utilities: Not At Risk (06/18/2024)  Social Connections: Patient Unable To Answer (06/18/2024)  Recent Concern: Social Connections - Moderately Isolated (05/23/2024)  Tobacco Use: Medium Risk (06/19/2024)    Readmission Risk Interventions     No data to display

## 2024-06-23 ENCOUNTER — Other Ambulatory Visit (HOSPITAL_COMMUNITY): Payer: Self-pay

## 2024-06-23 DIAGNOSIS — R310 Gross hematuria: Secondary | ICD-10-CM | POA: Diagnosis not present

## 2024-06-23 LAB — CBC
HCT: 27.7 % — ABNORMAL LOW (ref 39.0–52.0)
Hemoglobin: 8.9 g/dL — ABNORMAL LOW (ref 13.0–17.0)
MCH: 30 pg (ref 26.0–34.0)
MCHC: 32.1 g/dL (ref 30.0–36.0)
MCV: 93.3 fL (ref 80.0–100.0)
Platelets: 256 K/uL (ref 150–400)
RBC: 2.97 MIL/uL — ABNORMAL LOW (ref 4.22–5.81)
RDW: 14.6 % (ref 11.5–15.5)
WBC: 7.2 K/uL (ref 4.0–10.5)
nRBC: 0 % (ref 0.0–0.2)

## 2024-06-23 MED ORDER — POLYETHYLENE GLYCOL 3350 17 GM/SCOOP PO POWD
17.0000 g | Freq: Every day | ORAL | 0 refills | Status: AC
  Filled 2024-06-23: qty 238, 14d supply, fill #0

## 2024-06-23 MED ORDER — BISACODYL 5 MG PO TBEC: 10.0000 mg | DELAYED_RELEASE_TABLET | Freq: Every day | ORAL | Status: AC

## 2024-06-23 MED ORDER — OXYCODONE HCL 5 MG PO TABS: 5.0000 mg | ORAL_TABLET | Freq: Four times a day (QID) | ORAL | 0 refills | Status: DC | PRN

## 2024-06-23 NOTE — TOC Progression Note (Addendum)
 Transition of Care [facility]) - Progression Note    Patient Details  Name: Michael Odonnell MRN: [id number] Date of Birth: May 11, 1941  Transition of Care [facility]) CM/SW Contact  Alfonse JONELLE Rex, RN Phone Number: 06/23/2024, 12:34 PM  Clinical Narrative:   Met with patient, spouse and daughter Orrin) at bedside to review SNF bed offers ( [address], [place], [place], [place], [place]). Spouse/dtr requesting NCM to send referral to [facility] and [facility] or short term rehab bed offer. Referral sent in SNF HUB. NCM called to [facility], admit coord w/[facility], left vm requesting review for short term rehab. NCM called to [facility], admit coord w/[facility], left vm requesting review for short term rehab, await determination.   -1:00pm Call received from [facility] w/[facility], confirmed they only accept patients in their continual care ( ALF, ILF) to their rehab unit,     Expected Discharge Plan: Skilled Nursing Facility Barriers to Discharge: No SNF bed               Expected Discharge Plan and Services       Living arrangements for the past 2 months: Single Family Home                                       Social Drivers of Health (SDOH) Interventions SDOH Screenings   Food Insecurity: No Food Insecurity (06/18/2024)  Housing: Low Risk (06/18/2024)  Transportation Needs: No Transportation Needs (06/18/2024)  Utilities: Not At Risk (06/18/2024)  Social Connections: Patient Unable To Answer (06/18/2024)  Recent Concern: Social Connections - Moderately Isolated (05/23/2024)  Tobacco Use: Medium Risk (06/19/2024)    Readmission Risk Interventions     No data to display

## 2024-06-23 NOTE — Plan of Care (Signed)

## 2024-06-23 NOTE — Plan of Care (Signed)
   Problem: Coping: Goal: Level of anxiety will decrease Outcome: Progressing   Problem: Pain Managment: Goal: General experience of comfort will improve and/or be controlled Outcome: Progressing   Problem: Safety: Goal: Ability to remain free from injury will improve Outcome: Progressing

## 2024-06-23 NOTE — Discharge Summary (Signed)
 " Physician Discharge Summary   Patient: Michael Odonnell MRN: [id number] DOB: 04-27-1941  Admit date:     06/18/2024  Discharge date: 06/23/2024  Discharge Physician: Yetta Blanch  PCP: Nichole Senior, MD  Disposition: SNF Recommendations at discharge: Monitor CBC and BMP in 1 week. Follow-up with PCP in 1 week. Follow-up with urology for suprapubic catheter checkup.   Follow-up Information     Devere Lonni Righter, MD Follow up in 1 month(s).   Specialty: Urology Why: My office will call to schedule a follow-up appointment. Contact information: [address] [place] [address] [phone]         Nichole Senior, MD. Schedule an appointment as soon as possible for a visit in 1 week(s).   Specialty: Endocrinology Why: with BMP lab to look at kidney/electrolyte numbers, with CBC lab to look at blood counts Contact information: [address] [place] [address] [phone]                Hospital Course: Patient with PMH of chronic A-fib on Eliquis , HTN, Parkinson's disease, recent hip and humerus fracture, HTN, GERD presented to hospital with complaints of difficulty urination.   Patient is from facility.  They had difficulty urination and facility attempted to place a catheter.  Upon removal of the Foley catheter at the facility they did notice some blood and patient was brought to the hospital for further workup.  Urology was consulted.  Unsuccessful bedside attempt likely to false passage.  Underwent urgent cystoscopy but unable to pass a guidewire following which decision was made to place a suprapubic catheter.  Assessment and Plan: Gross hematuria In the setting of traumatic Foley catheter insertion. Appreciate urology assistance. Underwent cystoscopy and suprapubic catheter placement. Was on IV antibiotic.  And later on oral.  Course completed for now Staff reports intermittent drops of blood at the meatus.    Chronic  A-fib Hematuria Patient on Eliquis  at home for A-fib. Also on Cardizem . Eliquis  was on hold at admission.  Now tolerating well.   HTN. On Cardizem . Continue with same medication for now.   Parkinson's disease. Patient sees PT OT and SLP at the facility. Continuing Sinemet . Monitor for improvement in oral intake.   Left hip fracture status post ORIF 05/23/2024 WBAT.  Follow-up with Dr. Georgina in the office in 4 weeks.   Left proximal humerus fracture Continue left arm sling.  Nonweightbearing left upper extremity.  Rectal impaction. X-ray shows evidence of ongoing rectal impaction Continue with aggressive bowel regimen.   Pain control - [place]  Controlled Substance Reporting System database was reviewed. and patient was instructed, not to drive, operate heavy machinery, perform activities at heights, swimming or participation in water activities or provide baby-sitting services while on Pain, Sleep and Anxiety Medications; until their outpatient Physician has advised to do so again. Also recommended to not to take more than prescribed Pain, Sleep and Anxiety Medications.   Family Communication: Plan was discussed with Family. The pt provided permission to discuss medical plan with the family. Opportunity was given to ask question and all questions were answered satisfactorily.  Consultants:  Urology  Procedures performed:  Suprapubic catheter placement.  DISCHARGE MEDICATION: Allergies as of 06/23/2024       Reactions   Hydrochlorothiazide Nausea Only, Palpitations   Codeine Nausea And Vomiting   Doxycycline Nausea Only   Metronidazole Nausea Only   Mysoline [primidone] Other (See Comments)   Change in balance        Medication  List     STOP taking these medications    ciprofloxacin 500 MG tablet Commonly known as: CIPRO   lactulose 10 GM/15ML solution Commonly known as: CHRONULAC       TAKE these medications    carbidopa -levodopa  25-100 MG  tablet Commonly known as: SINEMET  IR TAKE 2 TABLETS BY MOUTH AT 6 AM, 1 TABLET AT 10 AM, 1 TABLET AT 2 PM, AND 1 TABLET AT 6 PM What changed:  how much to take how to take this when to take this additional instructions The timing of this medication is very important.   acetaminophen  500 MG tablet Commonly known as: TYLENOL  Take 1,000 mg by mouth every 6 (six) hours as needed for mild pain or headache.   bisacodyl  5 MG EC tablet Commonly known as: DULCOLAX Take 2 tablets (10 mg total) by mouth daily at 6 (six) AM. What changed:  how much to take when to take this reasons to take this   calcium  carbonate 500 MG chewable tablet Commonly known as: TUMS - dosed in mg elemental calcium  Chew 2 tablets by mouth daily as needed for indigestion or heartburn.   CARRINGTON MOIST BARRIER/ZINC EX Apply 1 application  topically See admin instructions. Apply to buttocks after each episode of incontinence- after first cleansing and drying   Centrum Silver 50+Men Tabs Take 1 tablet by mouth at bedtime.   diltiazem  360 MG 24 hr capsule Commonly known as: TIAZAC  TAKE 1 CAPSULE BY MOUTH DAILY   Eliquis  5 MG Tabs tablet Generic drug: apixaban  TAKE 1 TABLET BY MOUTH TWICE  DAILY What changed: when to take this   finasteride  5 MG tablet Commonly known as: PROSCAR  Take 5 mg by mouth daily.   magnesium  oxide 400 (240 Mg) MG tablet Commonly known as: MAG-OX Take 400 mg by mouth daily in the afternoon.   methocarbamol  500 MG tablet Commonly known as: ROBAXIN  Take 1 tablet (500 mg total) by mouth every 6 (six) hours as needed for muscle spasms.   Myrbetriq  50 MG Tb24 tablet Generic drug: mirabegron  ER Take 50 mg by mouth daily at 4 PM.   niacin  500 MG tablet Commonly known as: VITAMIN B3 Take 500 mg by mouth every 12 (twelve) hours.   Nutritional Drink Liqd Take 120 mLs by mouth See admin instructions. MedPass - Drink 120 ml's by mouth 2 times a day   oxyCODONE  5 MG immediate  release tablet Commonly known as: Oxy IR/ROXICODONE  Take 1 tablet (5 mg total) by mouth every 6 (six) hours as needed for moderate pain (pain score 4-6) or severe pain (pain score 7-10). What changed: reasons to take this   perindopril 8 MG tablet Commonly known as: ACEON Take 8 mg by mouth in the morning.   polyethylene glycol powder 17 GM/SCOOP powder Commonly known as: GLYCOLAX /MIRALAX  Take 17 g by mouth daily. Dissolve 1 capful (17g) in 4-8 ounces of liquid and take by mouth daily. What changed:  medication strength when to take this reasons to take this additional instructions   saccharomyces boulardii 250 MG capsule Commonly known as: FLORASTOR Take 250 mg by mouth 2 (two) times daily.   tamsulosin  0.4 MG Caps capsule Commonly known as: Flomax  Take 1 capsule (0.4 mg total) by mouth daily.               Discharge Care Instructions  (From admission, onward)           Start     Ordered   06/23/24 0000  Leave dressing on - Keep it clean, dry, and intact until clinic visit        06/23/24 1136            Diet recommendation: Regular diet  Discharge Exam: Vitals:   06/22/24 1323 06/22/24 2108 06/23/24 0621 06/23/24 1335  BP: (!) 126/55 (!) 154/65 (!) 148/62 (!) 125/50  Pulse: 66 72 70 69  Resp: 16 16 19 15   Temp: 98 F (36.7 C) 98.2 F (36.8 C) (!) 97.5 F (36.4 C) 98.1 F (36.7 C)  TempSrc: Oral Oral Oral Oral  SpO2: 95% 96% 94% 97%  Weight:      Height:        Filed Weights   06/18/24 2119  Weight: 74 kg   Clear to auscultation. S1-S2 present present bowel sound present No edema. Parkinsonian tremors present.  Condition at discharge: stable  The results of significant diagnostics from this hospitalization (including imaging, microbiology, ancillary and laboratory) are listed below for reference.   Imaging Studies: DG Abd Portable 1V Result Date: 06/20/2024 CLINICAL DATA:  Decreased bowel movements EXAM: PORTABLE ABDOMEN - 1 VIEW  COMPARISON:  CT 06/18/2024 FINDINGS: Single supine view of the abdomen and pelvis. Left proximal femur fixation. Gas-filled large and small bowel, without obstruction. Large amount of stool in the rectum and sigmoid. No gross free intraperitoneal air on this supine image. Probable phleboliths in the pelvis. IMPRESSION: Large distal colonic stool burden, suggesting fecal impaction. No bowel obstruction. Electronically Signed   By: Rockey Kilts M.D.   On: 06/20/2024 17:46   CT ABDOMEN PELVIS W CONTRAST Result Date: 06/18/2024 EXAM: CT ABDOMEN AND PELVIS WITH CONTRAST 06/18/2024 04:59:54 PM TECHNIQUE: CT of the abdomen and pelvis was performed with the administration of 100 mL of iohexol  (OMNIPAQUE ) 300 MG/ML solution. Multiplanar reformatted images are provided for review. Automated exposure control, iterative reconstruction, and/or weight-based adjustment of the mA/kV was utilized to reduce the radiation dose to as low as reasonably achievable. COMPARISON: CT abdomen 12/12/2023. CLINICAL HISTORY: Hematuria, gross/macroscopic. FINDINGS: LOWER CHEST: There is a calcified granuloma in the right lung base. There is linear atelectasis or scarring in both lung bases. LIVER: There are a few subcentimeter hypodensities in the liver which are unchanged, likely cysts or hemangiomas. GALLBLADDER AND BILE DUCTS: Gallbladder is unremarkable. No biliary ductal dilatation. SPLEEN: No acute abnormality. PANCREAS: No acute abnormality. ADRENAL GLANDS: Bilateral adrenal nodules are unchanged and were previously characterized as adenomas. KIDNEYS, URETERS AND BLADDER: Bilateral renal cysts and complex cysts appear unchanged from the prior study. There is a punctate nonobstructing left renal calculus, also unchanged. The bladder is markedly distended. No hydronephrosis. No perinephric or periureteral stranding. GI AND BOWEL: Stomach demonstrates no acute abnormality. There is a large amount of stool throughout the entire colon. There  is diffuse colonic diverticulosis. The appendix appears within normal limits. The rectum is dilated and stool filled measuring up to 9 cm with presacral edema. There is no bowel obstruction. PERITONEUM AND RETROPERITONEUM: No ascites. No free air. VASCULATURE: Aorta is normal in caliber. There are atherosclerotic calcifications of the aorta and iliac arteries. LYMPH NODES: No lymphadenopathy. REPRODUCTIVE ORGANS: Prostate gland is enlarged with calcifications. BONES AND SOFT TISSUES: Left sided hip screws are present. There is an intramuscular fluid collection lateral to the proximal femoral diaphysis measuring 6.6 x 3.0 x 3.1 cm. No acute osseous abnormality. No focal soft tissue abnormality. IMPRESSION: 1. Markedly distended bladder, suspicious for urinary retention. 2. Fecal impaction with stercoral colitis. 3. Diffuse colonic  diverticulosis without evidence of diverticulitis. 4. Moderate sized inguinal hernias, with the right inguinal hernia containing nondilated bowel. 5. Intramuscular fluid collection lateral to the proximal femoral diaphysis measuring 6.6 x 3.0 x 3.1 cm. Correlate clinically for infection. Electronically signed by: Greig Pique MD MD 06/18/2024 05:30 PM EST RP Workstation: [id number]   XR HIP UNILAT W OR W/O PELVIS 2-3 VIEWS LEFT Result Date: 06/10/2024 XRs of the left hip from 06/10/2024 were independently reviewed and interpreted, showing a valgus impacted femoral neck fracture.  Fracture alignment appears similar to intraoperative films from 05/23/2024.  There are 3 partially-threaded screws spanning the fracture site.  No lucency seen around the screws.  None of the screws have backed out.  No new fracture seen.  No dislocation seen.  XR Shoulder Left Result Date: 06/10/2024 XR of the left shoulder from 06/10/2024 was independently reviewed and interpreted, showing a significantly displaced proximal humerus fracture with medial translation and shortening through the fracture site.  No new fracture seen. No dislocation seen.   DG Chest Port 1 View Result Date: 05/28/2024 EXAM: 1 VIEW XRAY OF THE CHEST 05/28/2024 05:55:00 AM COMPARISON: 05/23/2024 CLINICAL HISTORY: Shortness of breath FINDINGS: LUNGS AND PLEURA: Asymmetric elevation of the right hemidiaphragm, unchanged from previous exam. No focal pulmonary opacity. No pleural effusion. No pneumothorax. HEART AND MEDIASTINUM: No acute abnormality of the cardiac and mediastinal silhouettes. BONES AND SOFT TISSUES: Comminuted and displaced left humeral neck fracture. IMPRESSION: 1. Asymmetric elevation of the right hemidiaphragm, unchanged from previous exam. 2. Comminuted and displaced left humeral neck fracture. Electronically signed by: Waddell Calk MD 05/28/2024 06:27 AM EST RP Workstation: [id number]   MR BRAIN WO CONTRAST Result Date: 05/28/2024 CLINICAL DATA:  Initial evaluation for acute mental status change. EXAM: MRI HEAD WITHOUT CONTRAST TECHNIQUE: Multiplanar, multiecho pulse sequences of the brain and surrounding structures were obtained without intravenous contrast. COMPARISON:  CT from 05/27/2024. FINDINGS: Brain: Mild age-related cerebral atrophy. Patchy T2/FLAIR hyperintensity involving the periventricular and deep white matter, consistent with chronic small vessel ischemic disease, mild for age. No evidence for acute or subacute infarct. No areas of chronic cortical infarction. No acute or chronic intracranial blood products. No mass lesion, midline shift or mass effect. No hydrocephalus or extra-axial fluid collection. Pituitary gland within normal limits. Vascular: Major intracranial vascular flow voids are maintained. Mild atheromatous irregularity noted about the intradural left V4 segment. Skull and upper cervical spine: Craniocervical junction within normal limits. Bone marrow signal intensity overall within normal limits. No scalp soft tissue abnormality. Sinuses/Orbits: Globes orbital soft tissues within normal  limits. Scattered mucosal thickening about the ethmoidal air cells. Right-to-left nasal septal deviation with associated concha bullosa. Small right mastoid effusion, of doubtful significance. Other: None. IMPRESSION: 1. No acute intracranial abnormality. 2. Mild age-related cerebral atrophy with chronic small vessel ischemic disease. Electronically Signed   By: Morene Hoard M.D.   On: 05/28/2024 02:00   CT Head Wo Contrast Result Date: 05/27/2024 EXAM: CT HEAD WITHOUT CONTRAST 05/27/2024 10:15:23 PM TECHNIQUE: CT of the head was performed without the administration of intravenous contrast. Automated exposure control, iterative reconstruction, and/or weight based adjustment of the mA/kV was utilized to reduce the radiation dose to as low as reasonably achievable. COMPARISON: 05/23/2024 CLINICAL HISTORY: Head trauma, moderate-severe. FINDINGS: BRAIN AND VENTRICLES: No acute hemorrhage. No evidence of acute infarct. Periventricular white matter decreased attenuation consistent with small vessel ischemic changes. Prominent ventricles, sulci and cisterns consistent with age-related involutional changes. Remote infarct involving the right globus pallidus. No extra-axial  collection. No mass effect or midline shift. ORBITS: No acute abnormality. SINUSES: No acute abnormality. SOFT TISSUES AND SKULL: No acute soft tissue abnormality. No skull fracture. IMPRESSION: 1. No acute intracranial abnormality. 2. Periventricular white matter decreased attenuation consistent with small vessel ischemic changes. 3. Prominent ventricles, sulci, and cisterns consistent with age-related involutional changes. 4. Remote infarct involving the right globus pallidus. Electronically signed by: Dorethia Molt MD 05/27/2024 10:23 PM EST RP Workstation: [id number]    Microbiology: Results for orders placed or performed during the hospital encounter of 05/27/24  Blood culture (routine x 2)     Status: None   Collection Time:  05/27/24  9:33 PM   Specimen: BLOOD RIGHT ARM  Result Value Ref Range Status   Specimen Description BLOOD RIGHT ARM  Final   Special Requests   Final    BOTTLES DRAWN AEROBIC AND ANAEROBIC Blood Culture adequate volume   Culture   Final    NO GROWTH 5 DAYS Performed at [facility], [address], [place], [place] [address]    Report Status 06/01/2024 FINAL  Final  Blood culture (routine x 2)     Status: None   Collection Time: 05/27/24  9:38 PM   Specimen: BLOOD LEFT ARM  Result Value Ref Range Status   Specimen Description BLOOD LEFT ARM  Final   Special Requests   Final    BOTTLES DRAWN AEROBIC AND ANAEROBIC Blood Culture adequate volume   Culture   Final    NO GROWTH 5 DAYS Performed at [facility], [address], [place], [place] [address]    Report Status 06/01/2024 FINAL  Final   Labs: CBC: Recent Labs  Lab 06/18/24 1526 06/19/24 0401 06/20/24 0023 06/21/24 0315 06/22/24 0320 06/23/24 0353  WBC 11.9* 21.4* 16.7* 10.9* 7.7 7.2  NEUTROABS 9.7*  --   --   --   --   --   HGB 10.8* 8.0* 7.9* 8.4* 8.9* 8.9*  HCT 34.6* 25.6* 24.8* 26.2* 27.6* 27.7*  MCV 94.3 94.5 94.7 93.6 92.3 93.3  PLT 360 249 228 243 252 256   Basic Metabolic Panel: Recent Labs  Lab 06/18/24 1526 06/19/24 0401 06/20/24 0023 06/21/24 0315 06/22/24 0320  NA 137 136 135 136 138  K 4.3 4.1 4.1 3.9 3.8  CL 101 104 103 102 102  CO2 26 23 25 27 26   GLUCOSE 154* 181* 160* 120* 117*  BUN 16 17 19 12 12   CREATININE 1.12 1.15 1.04 0.76 0.80  CALCIUM  9.6 8.3* 8.3* 9.2 9.2  MG  --   --   --  2.5* 2.4   Liver Function Tests: No results for input(s): AST, ALT, ALKPHOS, BILITOT, PROT, ALBUMIN  in the last 168 hours. CBG: No results for input(s): GLUCAP in the last 168 hours.  Discharge time spent: 35 minutes  Author: Yetta Blanch, MD  [facility]    "

## 2024-06-24 NOTE — TOC Progression Note (Signed)
 Transition of Care [facility]) - Progression Note    Patient Details  Name: Michael Odonnell MRN: [id number] Date of Birth: June 19, 1940  [facility] CM/SW Contact  Alfonse JONELLE Rex, RN Phone Number: 06/24/2024, 3:18 PM  Clinical Narrative:   Met with spouse at bedside, reviewed SNF bed offers, spouse accepted bed offer at [facility]. CM called to [facility], admit coord w/[facility], vm left notifying of bed acceptance. SNF auth initiated via [facility]. Auth ID [id number], shara pending.     Expected Discharge Plan: Skilled Nursing Facility Barriers to Discharge: No SNF bed               Expected Discharge Plan and Services       Living arrangements for the past 2 months: Single Family Home                                       Social Drivers of Health (SDOH) Interventions SDOH Screenings   Food Insecurity: No Food Insecurity (06/18/2024)  Housing: Low Risk (06/18/2024)  Transportation Needs: No Transportation Needs (06/18/2024)  Utilities: Not At Risk (06/18/2024)  Social Connections: Patient Unable To Answer (06/18/2024)  Recent Concern: Social Connections - Moderately Isolated (05/23/2024)  Tobacco Use: Medium Risk (06/19/2024)    Readmission Risk Interventions     No data to display

## 2024-06-24 NOTE — Progress Notes (Signed)
 Occupational Therapy Treatment Patient Details Name: Michael Odonnell MRN: [id number] DOB: 12-13-1940 Today's Date: 06/24/2024   History of present illness Pt admitted on 06/18/24 from [facility] 2* hematuria and now S/P suprapubic cath placement.  Pt in [facility] for rehab followin L hip fx (s/p pinning) and L humeral fx ( nonoperative) in December.  Pt with hx of Parkinsons, sciatica and a-fib on Eliquos.   OT comments  Pt making gradual progress towards goals. Pt continues to be significantly limited in mobility + ADL performance due to generalized weakness, non-functional use of dominant LUE, poor activity tolerance and need for +2. Pt tolerated standing with +2 MOD assist from elevated bed height, incontinent of BM and too fatigued to attempt x2 standing, returned to bed for pericare. Spouse inquiring about practicing stairs to potentially discharge home. Educated pt and spouse that given level of weakness, fatigue and inability to take steps, attempting stairs this session would not be safe. OT educated on recommendation to return to SNF with pt and spouse in agreement. Patient will benefit from continued inpatient follow up therapy, <3 hours/day       If plan is discharge home, recommend the following:  Two people to help with walking and/or transfers;Two people to help with bathing/dressing/bathroom;Assistance with feeding;Assist for transportation   Equipment Recommendations  Other (comment)       Precautions / Restrictions Precautions Precautions: Fall Recall of Precautions/Restrictions: Impaired Precaution/Restrictions Comments: hx parkinson's Restrictions Weight Bearing Restrictions Per Provider Order: Yes LUE Weight Bearing Per Provider Order: Non weight bearing LLE Weight Bearing Per Provider Order: Weight bearing as tolerated       Mobility Bed Mobility Overal bed mobility: Needs Assistance Bed Mobility: Supine to Sit, Sit to Supine     Supine to sit: HOB elevated,  Max assist, +2 for physical assistance Sit to supine: Max assist, HOB elevated        Transfers Overall transfer level: Needs assistance Equipment used: 2 person hand held assist Transfers: Sit to/from Stand Sit to Stand: Mod assist, From elevated surface           General transfer comment: stands from elevated bed height tolerated about 30 seconds but incontinent of BM. pt too fatigued to attempt x2 stand and returned to bed     Balance Overall balance assessment: Needs assistance, History of Falls Sitting-balance support: Single extremity supported, Feet supported Sitting balance-Leahy Scale: Fair Sitting balance - Comments: fatigues quickly with kyphotic posture     Standing balance-Leahy Scale: Poor Standing balance comment: needs MIN A for static stand                           ADL either performed or assessed with clinical judgement   ADL Overall ADL's : Needs assistance/impaired                             Toileting- Clothing Manipulation and Hygiene: Total assistance Toileting - Clothing Manipulation Details (indicate cue type and reason): pt incontinent of large, loose BM immediately upon standing. TOTAL A for bed level pericare.     Functional mobility during ADLs: Maximal assistance;+2 for physical assistance;+2 for safety/equipment General ADL Comments: Pt able to stand ~30 seconds with +2 (foot/L knee blocked with HHA RUE given NWB LUE). Pt with poor tolerance and fatigues quickly. Returned to bed after incontinent of BM, pt unable to tolerate transfer or standing x2 for pericare  Extremity/Trunk Assessment Upper Extremity Assessment Upper Extremity Assessment:  (LUE in sling, NWB maintained throughout. TOTAL A to adjust sling properly)                     Communication Communication Communication: Impaired Factors Affecting Communication: Difficulty expressing self;Hearing impaired   Cognition Arousal: Alert Behavior  During Therapy: WFL for tasks assessed/performed Cognition: Cognition impaired   Orientation impairments: Situation Awareness: Intellectual awareness impaired, Online awareness impaired Memory impairment (select all impairments): Working memory Attention impairment (select first level of impairment): Selective attention Executive functioning impairment (select all impairments): Organization, Sequencing, Reasoning, Problem solving OT - Cognition Comments: hx of Parkinsons. Pt appropriate                 Following commands: Impaired Following commands impaired: Only follows one step commands consistently, Follows one step commands with increased time      Cueing   Cueing Techniques: Verbal cues, Gestural cues, Visual cues, Tactile cues  Exercises      Shoulder Instructions       General Comments RN notified of BM, small spots of blood noted on gown    Pertinent Vitals/ Pain       Pain Assessment Pain Assessment: No/denies pain   Frequency  Min 2X/week        Progress Toward Goals  OT Goals(current goals can now be found in the care plan section)  Progress towards OT goals: Progressing toward goals  Acute Rehab OT Goals OT Goal Formulation: With patient/family Time For Goal Achievement: 07/05/24 Potential to Achieve Goals: Fair  Plan         AM-PAC OT 6 Clicks Daily Activity     Outcome Measure   Help from another person eating meals?: A Little Help from another person taking care of personal grooming?: A Little Help from another person toileting, which includes using toliet, bedpan, or urinal?: Total Help from another person bathing (including washing, rinsing, drying)?: A Lot Help from another person to put on and taking off regular upper body clothing?: A Lot Help from another person to put on and taking off regular lower body clothing?: Total 6 Click Score: 12    End of Session Equipment Utilized During Treatment: Other (comment)  OT Visit  Diagnosis: Unsteadiness on feet (R26.81);Other abnormalities of gait and mobility (R26.89);Muscle weakness (generalized) (M62.81)   Activity Tolerance Patient limited by fatigue   Patient Left in bed;with call bell/phone within reach;with family/visitor present;with bed alarm set   Nurse Communication Mobility status        Time: 8785-8753 OT Time Calculation (min): 32 min  Charges: OT General Charges $OT Visit: 1 Visit OT Treatments $Self Care/Home Management : 23-37 mins  [place] L. Tanaisha Pittman, OTR/L  06/24/2024, 1:39 PM

## 2024-06-24 NOTE — Progress Notes (Signed)
 Patient sitting uncomfortably in bed reviewed at the bedside with family and discussed recommendations of therapy services and lack of safety going home   Family deciding on skilled   Discharge delayed until decision made  Reggie Grimes, MD [facility] 3:19 PM  No charge

## 2024-06-24 NOTE — Plan of Care (Signed)
   Problem: Coping: Goal: Level of anxiety will decrease Outcome: Progressing   Problem: Pain Managment: Goal: General experience of comfort will improve and/or be controlled Outcome: Progressing   Problem: Safety: Goal: Ability to remain free from injury will improve Outcome: Progressing

## 2024-06-25 MED ORDER — OXYCODONE HCL 5 MG PO TABS: 5.0000 mg | ORAL_TABLET | Freq: Four times a day (QID) | ORAL | 0 refills | Status: DC | PRN

## 2024-06-25 NOTE — Progress Notes (Signed)
 Report called to nurse at [facility].  Donzell Barge, RN

## 2024-06-25 NOTE — Discharge Summary (Signed)
 Physician Discharge Summary  Michael Odonnell FMW:[id number] DOB: [birth year] DOA: 06/18/2024  PCP: Nichole Senior, MD  Admit date: 06/18/2024 Discharge date: 06/25/2024  Time spent: 40 minutes  Recommendations for Outpatient Follow-up:  Will require further management and therapy at [facility] in terms of skilled management Limited prescription of oxycodone  given for pain control CC Dr. Georgina of orthopedics with regards to  follow-up for left hip repair as well as weightbearing status of left upper extremity-currently nonweightbearing left upper extremity and can weight-bear left lower extremity--- wife indicates 6-week appointment may be on 07/20/2024 CC Dr. Devere with regards to outpatient urology follow-up for suprapubic catheter discussion and management--wife indicates follow-up appointment is on 07/22/2024  Discharge Diagnoses:  MAIN problem for hospitalization   Gross hematuria in the setting of difficulty passing urine  Please see below for itemized issues addressed in HOpsital- refer to other progress notes for clarity if needed  Discharge Condition: Improved  Diet recommendation: Heart healthy  Filed Weights   06/18/24 2119  Weight: 74 kg    History of present illness:  84 year old white male Known history of chronic A-fib CHADVASC >4 on Eliquis  HTN Parkinson's disease with some delirium previously Recent left hip fracture status post fall with surgery 05/23/2024 by Dr. Georgina and repair by him-he was to be weightbearing left lower extremity but nonweightbearing left upper extremity and keep in sling He has been admitted several times in the past several months--12/13 to 12/16 with left hip fracture as well as was a fracture of his left upper arm which was nonoperable , readmitted 12/17 through 06/01/2024 with delirium in the setting of missing doses of Sinemet -and was discharged back to [facility]--at that admission Eliquis  was held briefly and then resumed  1/8 presented back  to the emergency room with gross hematuria-he had decreased urine output and at the facility they tried to place Foley and then they found blood as well as clots and bloody output at the meatus--- 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 Georgina in the next week to get a follow-up with regards to x-rays and further workup for healing of his hip  He has improved to some degree and is stable for discharge to a rehab facility today    Discharge Exam: Vitals:   06/24/24 2130 06/25/24 0504  BP: (!) 139/59 (!) 142/59  Pulse: 70 66  Resp: 16 15  Temp: 98.5 F (36.9 C) 98.1 F (36.7 C)  SpO2: 95% 94%    Subj on day of d/c   He looks well today no distress is more engaging conversant does not seem to be in severe pain he is not wearing his sling on his left arm  General Exam on discharge  EOMI NCAT pleasant coherent no distress CTAB no added sound no rales no rhonchi no wheeze Power 5/5 lower extremities suprapubic catheter in place ROM intact No lower extremity edema  Discharge Instructions   Discharge Instructions     Increase activity slowly   Complete by: As directed    Increase activity slowly   Complete by: As directed    Leave dressing on - Keep it clean, dry, and intact until clinic visit   Complete by: As directed    No wound care   Complete by: As directed        Allergies as of 06/25/2024       Reactions   Hydrochlorothiazide Nausea Only, Palpitations   Codeine Nausea And Vomiting   Doxycycline Nausea Only   Metronidazole Nausea Only   Mysoline [primidone] Other (See Comments)   Change in balance        Medication List     STOP taking these medications    ciprofloxacin 500 MG tablet Commonly known as: CIPRO   lactulose 10 GM/15ML solution Commonly known as: CHRONULAC       TAKE these medications    carbidopa -levodopa  25-100 MG tablet Commonly known as: SINEMET  IR TAKE 2 TABLETS BY MOUTH AT 6 AM, 1 TABLET AT 10 AM, 1 TABLET AT 2 PM, AND 1 TABLET AT 6 PM What changed:  how much to take how to take this when to take this additional instructions The timing of this medication is very important.   acetaminophen  500 MG tablet Commonly known as: TYLENOL  Take 1,000 mg by mouth every 6 (six) hours as needed for mild pain or headache.   bisacodyl  5 MG EC tablet Commonly known as: DULCOLAX Take 2 tablets (10 mg total) by mouth daily at 6 (six) AM. What changed:  how much to take when to take this reasons to take this   calcium  carbonate 500 MG chewable tablet Commonly known as: TUMS - dosed in mg elemental calcium  Chew 2 tablets by mouth daily as needed for indigestion or heartburn.   CARRINGTON MOIST BARRIER/ZINC EX Apply 1 application  topically See admin instructions. Apply to buttocks after each episode of incontinence- after first cleansing and drying   Centrum Silver 50+Men Tabs Take 1 tablet by mouth at bedtime.   diltiazem  360 MG 24 hr capsule Commonly known as: TIAZAC  TAKE 1 CAPSULE BY MOUTH DAILY   Eliquis  5 MG Tabs tablet Generic drug: apixaban  TAKE 1 TABLET BY MOUTH TWICE  DAILY What changed: when to take this   finasteride  5 MG tablet Commonly known as: PROSCAR  Take 5 mg by mouth daily.   magnesium  oxide 400 (240 Mg) MG tablet Commonly known as: MAG-OX Take 400 mg by mouth daily in the  afternoon.   methocarbamol  500 MG tablet Commonly known as: ROBAXIN  Take 1 tablet (500 mg total) by mouth every 6 (six) hours as needed for muscle spasms.   Myrbetriq  50 MG Tb24 tablet Generic drug: mirabegron  ER Take 50 mg by mouth daily at 4 PM.   niacin  500 MG tablet Commonly known as: VITAMIN B3 Take 500 mg by mouth every 12 (twelve) hours.   Nutritional Drink Liqd Take 120 mLs by mouth See admin instructions. MedPass - Drink 120 ml's by mouth 2 times a day   oxyCODONE  5 MG immediate release tablet Commonly known as: Oxy IR/ROXICODONE  Take 1 tablet (5 mg total) by mouth every 6 (six) hours as needed for moderate pain (pain score 4-6) or severe pain (pain score 7-10). What changed: reasons to take this   perindopril 8 MG tablet Commonly known as: ACEON Take 8 mg by mouth in the morning.   polyethylene glycol powder  17 GM/SCOOP powder Commonly known as: GLYCOLAX /MIRALAX  Take 17 g by mouth daily. Dissolve 1 capful (17g) in 4-8 ounces of liquid and take by mouth daily. What changed:  medication strength when to take this reasons to take this additional instructions   saccharomyces boulardii 250 MG capsule Commonly known as: FLORASTOR Take 250 mg by mouth 2 (two) times daily.   tamsulosin  0.4 MG Caps capsule Commonly known as: Flomax  Take 1 capsule (0.4 mg total) by mouth daily.               Discharge Care Instructions  (From admission, onward)           Start     Ordered   06/23/24 0000  Leave dressing on - Keep it clean, dry, and intact until clinic visit        06/23/24 1136           Allergies[1]  Contact information for follow-up providers     Devere Lonni Righter, MD Follow up in 1 month(s).   Specialty: Urology Why: My office will call to schedule a follow-up appointment. Contact information: [address] [place] [address] [phone]         Nichole Senior, MD. Schedule an appointment as soon as possible  for a visit in 1 week(s).   Specialty: Endocrinology Why: with BMP lab to look at kidney/electrolyte numbers, with CBC lab to look at blood counts Contact information: [address] [place] [address] [phone]              Contact information for after-discharge care     Destination     [facility] .   Service: Skilled Nursing Contact information: [address]  [place]  [address] [phone]                      The results of significant diagnostics from this hospitalization (including imaging, microbiology, ancillary and laboratory) are listed below for reference.    Significant Diagnostic Studies: DG Abd Portable 1V Result Date: 06/20/2024 CLINICAL DATA:  Decreased bowel movements EXAM: PORTABLE ABDOMEN - 1 VIEW COMPARISON:  CT 06/18/2024 FINDINGS: Single supine view of the abdomen and pelvis. Left proximal femur fixation. Gas-filled large and small bowel, without obstruction. Large amount of stool in the rectum and sigmoid. No gross free intraperitoneal air on this supine image. Probable phleboliths in the pelvis. IMPRESSION: Large distal colonic stool burden, suggesting fecal impaction. No bowel obstruction. Electronically Signed   By: Rockey Kilts M.D.   On: 06/20/2024 17:46   CT ABDOMEN PELVIS W CONTRAST Result Date: 06/18/2024 EXAM: CT ABDOMEN AND PELVIS WITH CONTRAST 06/18/2024 04:59:54 PM TECHNIQUE: CT of the abdomen and pelvis was performed with the administration of 100 mL of iohexol  (OMNIPAQUE ) 300 MG/ML solution. Multiplanar reformatted images are provided for review. Automated exposure control, iterative reconstruction, and/or weight-based adjustment of the mA/kV was utilized to reduce the radiation dose to as low as reasonably achievable. COMPARISON: CT abdomen 12/12/2023. CLINICAL HISTORY: Hematuria, gross/macroscopic. FINDINGS: LOWER CHEST: There is a calcified granuloma in the right lung base. There is linear atelectasis or  scarring in both lung bases. LIVER: There are a few subcentimeter hypodensities in the liver which are unchanged, likely cysts or hemangiomas. GALLBLADDER AND BILE DUCTS: Gallbladder is unremarkable. No biliary ductal dilatation. SPLEEN: No acute abnormality. PANCREAS: No acute abnormality. ADRENAL GLANDS: Bilateral adrenal nodules are unchanged and were previously characterized as adenomas. KIDNEYS, URETERS AND BLADDER: Bilateral renal  cysts and complex cysts appear unchanged from the prior study. There is a punctate nonobstructing left renal calculus, also unchanged. The bladder is markedly distended. No hydronephrosis. No perinephric or periureteral stranding. GI AND BOWEL: Stomach demonstrates no acute abnormality. There is a large amount of stool throughout the entire colon. There is diffuse colonic diverticulosis. The appendix appears within normal limits. The rectum is dilated and stool filled measuring up to 9 cm with presacral edema. There is no bowel obstruction. PERITONEUM AND RETROPERITONEUM: No ascites. No free air. VASCULATURE: Aorta is normal in caliber. There are atherosclerotic calcifications of the aorta and iliac arteries. LYMPH NODES: No lymphadenopathy. REPRODUCTIVE ORGANS: Prostate gland is enlarged with calcifications. BONES AND SOFT TISSUES: Left sided hip screws are present. There is an intramuscular fluid collection lateral to the proximal femoral diaphysis measuring 6.6 x 3.0 x 3.1 cm. No acute osseous abnormality. No focal soft tissue abnormality. IMPRESSION: 1. Markedly distended bladder, suspicious for urinary retention. 2. Fecal impaction with stercoral colitis. 3. Diffuse colonic diverticulosis without evidence of diverticulitis. 4. Moderate sized inguinal hernias, with the right inguinal hernia containing nondilated bowel. 5. Intramuscular fluid collection lateral to the proximal femoral diaphysis measuring 6.6 x 3.0 x 3.1 cm. Correlate clinically for infection. Electronically signed  by: Greig Pique MD MD 06/18/2024 05:30 PM EST RP Workstation: [id number]   XR HIP UNILAT W OR W/O PELVIS 2-3 VIEWS LEFT Result Date: 06/10/2024 XRs of the left hip from 06/10/2024 were independently reviewed and interpreted, showing a valgus impacted femoral neck fracture.  Fracture alignment appears similar to intraoperative films from 05/23/2024.  There are 3 partially-threaded screws spanning the fracture site.  No lucency seen around the screws.  None of the screws have backed out.  No new fracture seen.  No dislocation seen.  XR Shoulder Left Result Date: 06/10/2024 XR of the left shoulder from 06/10/2024 was independently reviewed and interpreted, showing a significantly displaced proximal humerus fracture with medial translation and shortening through the fracture site. No new fracture seen. No dislocation seen.   DG Chest Port 1 View Result Date: 05/28/2024 EXAM: 1 VIEW XRAY OF THE CHEST 05/28/2024 05:55:00 AM COMPARISON: 05/23/2024 CLINICAL HISTORY: Shortness of breath FINDINGS: LUNGS AND PLEURA: Asymmetric elevation of the right hemidiaphragm, unchanged from previous exam. No focal pulmonary opacity. No pleural effusion. No pneumothorax. HEART AND MEDIASTINUM: No acute abnormality of the cardiac and mediastinal silhouettes. BONES AND SOFT TISSUES: Comminuted and displaced left humeral neck fracture. IMPRESSION: 1. Asymmetric elevation of the right hemidiaphragm, unchanged from previous exam. 2. Comminuted and displaced left humeral neck fracture. Electronically signed by: Waddell Calk MD 05/28/2024 06:27 AM EST RP Workstation: [id number]   MR BRAIN WO CONTRAST Result Date: 05/28/2024 CLINICAL DATA:  Initial evaluation for acute mental status change. EXAM: MRI HEAD WITHOUT CONTRAST TECHNIQUE: Multiplanar, multiecho pulse sequences of the brain and surrounding structures were obtained without intravenous contrast. COMPARISON:  CT from 05/27/2024. FINDINGS: Brain: Mild age-related cerebral  atrophy. Patchy T2/FLAIR hyperintensity involving the periventricular and deep white matter, consistent with chronic small vessel ischemic disease, mild for age. No evidence for acute or subacute infarct. No areas of chronic cortical infarction. No acute or chronic intracranial blood products. No mass lesion, midline shift or mass effect. No hydrocephalus or extra-axial fluid collection. Pituitary gland within normal limits. Vascular: Major intracranial vascular flow voids are maintained. Mild atheromatous irregularity noted about the intradural left V4 segment. Skull and upper cervical spine: Craniocervical junction within normal limits. Bone marrow signal intensity overall within normal  limits. No scalp soft tissue abnormality. Sinuses/Orbits: Globes orbital soft tissues within normal limits. Scattered mucosal thickening about the ethmoidal air cells. Right-to-left nasal septal deviation with associated concha bullosa. Small right mastoid effusion, of doubtful significance. Other: None. IMPRESSION: 1. No acute intracranial abnormality. 2. Mild age-related cerebral atrophy with chronic small vessel ischemic disease. Electronically Signed   By: Morene Hoard M.D.   On: 05/28/2024 02:00   CT Head Wo Contrast Result Date: 05/27/2024 EXAM: CT HEAD WITHOUT CONTRAST 05/27/2024 10:15:23 PM TECHNIQUE: CT of the head was performed without the administration of intravenous contrast. Automated exposure control, iterative reconstruction, and/or weight based adjustment of the mA/kV was utilized to reduce the radiation dose to as low as reasonably achievable. COMPARISON: 05/23/2024 CLINICAL HISTORY: Head trauma, moderate-severe. FINDINGS: BRAIN AND VENTRICLES: No acute hemorrhage. No evidence of acute infarct. Periventricular white matter decreased attenuation consistent with small vessel ischemic changes. Prominent ventricles, sulci and cisterns consistent with age-related involutional changes. Remote infarct  involving the right globus pallidus. No extra-axial collection. No mass effect or midline shift. ORBITS: No acute abnormality. SINUSES: No acute abnormality. SOFT TISSUES AND SKULL: No acute soft tissue abnormality. No skull fracture. IMPRESSION: 1. No acute intracranial abnormality. 2. Periventricular white matter decreased attenuation consistent with small vessel ischemic changes. 3. Prominent ventricles, sulci, and cisterns consistent with age-related involutional changes. 4. Remote infarct involving the right globus pallidus. Electronically signed by: Dorethia Molt MD 05/27/2024 10:23 PM EST RP Workstation: [id number]    Microbiology: No results found for this or any previous visit (from the past 240 hours).   Labs: Basic Metabolic Panel: Recent Labs  Lab 06/18/24 1526 06/19/24 0401 06/20/24 0023 06/21/24 0315 06/22/24 0320  NA 137 136 135 136 138  K 4.3 4.1 4.1 3.9 3.8  CL 101 104 103 102 102  CO2 26 23 25 27 26   GLUCOSE 154* 181* 160* 120* 117*  BUN 16 17 19 12 12   CREATININE 1.12 1.15 1.04 0.76 0.80  CALCIUM  9.6 8.3* 8.3* 9.2 9.2  MG  --   --   --  2.5* 2.4   Liver Function Tests: No results for input(s): AST, ALT, ALKPHOS, BILITOT, PROT, ALBUMIN  in the last 168 hours. No results for input(s): LIPASE, AMYLASE in the last 168 hours. No results for input(s): AMMONIA in the last 168 hours. CBC: Recent Labs  Lab 06/18/24 1526 06/19/24 0401 06/20/24 0023 06/21/24 0315 06/22/24 0320 06/23/24 0353  WBC 11.9* 21.4* 16.7* 10.9* 7.7 7.2  NEUTROABS 9.7*  --   --   --   --   --   HGB 10.8* 8.0* 7.9* 8.4* 8.9* 8.9*  HCT 34.6* 25.6* 24.8* 26.2* 27.6* 27.7*  MCV 94.3 94.5 94.7 93.6 92.3 93.3  PLT 360 249 228 243 252 256   Cardiac Enzymes: No results for input(s): CKTOTAL, CKMB, CKMBINDEX, TROPONINI in the last 168 hours. BNP: BNP (last 3 results) No results for input(s): BNP in the last 8760 hours.  ProBNP (last 3 results) No results for  input(s): PROBNP in the last 8760 hours.  CBG: No results for input(s): GLUCAP in the last 168 hours.  Signed:  Colen Grimes MD   [facility] 06/25/2024, 1:17 PM      [1]  Allergies Allergen Reactions   Hydrochlorothiazide Nausea Only and Palpitations   Codeine Nausea And Vomiting   Doxycycline Nausea Only   Metronidazole Nausea Only   Mysoline [Primidone] Other (See Comments)    Change in balance

## 2024-06-25 NOTE — TOC Transition Note (Signed)
 Transition of Care [facility]) - Discharge Note   Patient Details  Name: Michael Odonnell MRN: [id number] Date of Birth: 07-24-1940  Transition of [facility]) CM/SW Contact:  Alfonse JONELLE Rex, RN Phone Number: 06/25/2024, 12:58 PM   Clinical Narrative:    DC to SNF [facility]), spouse at bedside, PTAR for transport. No further CM needs identified at this time.     Final next level of care: Skilled Nursing Facility Barriers to Discharge: Barriers Resolved   Patient Goals and CMS Choice   CMS Medicare.gov Compare Post Acute Care list provided to:: Patient Represenative (must comment) (Porro,joanne  Spouse, Emergency Contact  [phone] (Mobile)) Choice offered to / list presented to : Spouse [place] ownership interest in [facility].provided to:: Spouse    Discharge Placement              Patient chooses bed at: [facility] Patient to be transferred to facility by: PTAR Name of family member notified: Spoue at  bedside Patient and family notified of of transfer: 06/25/24  Discharge Plan and Services Additional resources added to the After Visit Summary for                                       Social Drivers of Health (SDOH) Interventions SDOH Screenings   Food Insecurity: No Food Insecurity (06/18/2024)  Housing: Low Risk (06/18/2024)  Transportation Needs: No Transportation Needs (06/18/2024)  Utilities: Not At Risk (06/18/2024)  Social Connections: Patient Unable To Answer (06/18/2024)  Recent Concern: Social Connections - Moderately Isolated (05/23/2024)  Tobacco Use: Medium Risk (06/19/2024)     Readmission Risk Interventions     No data to display

## 2024-06-27 ENCOUNTER — Encounter (HOSPITAL_COMMUNITY): Payer: Self-pay | Admitting: *Deleted

## 2024-06-27 ENCOUNTER — Other Ambulatory Visit: Payer: Self-pay

## 2024-06-27 ENCOUNTER — Inpatient Hospital Stay (HOSPITAL_COMMUNITY)
Admission: EM | Admit: 2024-06-27 | Discharge: 2024-07-03 | DRG: 663 | Disposition: A | Discharge status: Skilled Nursing Facility | Source: Skilled Nursing Facility | Attending: Family Medicine | Admitting: Family Medicine

## 2024-06-27 ENCOUNTER — Observation Stay (HOSPITAL_COMMUNITY): Admitting: Certified Registered"

## 2024-06-27 ENCOUNTER — Emergency Department (HOSPITAL_COMMUNITY)

## 2024-06-27 ENCOUNTER — Encounter (HOSPITAL_COMMUNITY)
Admission: EM | Disposition: A | Discharge status: Skilled Nursing Facility | Payer: Self-pay | Source: Skilled Nursing Facility | Attending: Family Medicine

## 2024-06-27 DIAGNOSIS — N400 Enlarged prostate without lower urinary tract symptoms: Secondary | ICD-10-CM | POA: Diagnosis present

## 2024-06-27 DIAGNOSIS — Z888 Allergy status to other drugs, medicaments and biological substances status: Secondary | ICD-10-CM

## 2024-06-27 DIAGNOSIS — N1831 Chronic kidney disease, stage 3a: Secondary | ICD-10-CM

## 2024-06-27 DIAGNOSIS — R54 Age-related physical debility: Secondary | ICD-10-CM | POA: Diagnosis present

## 2024-06-27 DIAGNOSIS — E1122 Type 2 diabetes mellitus with diabetic chronic kidney disease: Secondary | ICD-10-CM | POA: Diagnosis present

## 2024-06-27 DIAGNOSIS — S42209A Unspecified fracture of upper end of unspecified humerus, initial encounter for closed fracture: Secondary | ICD-10-CM

## 2024-06-27 DIAGNOSIS — R319 Hematuria, unspecified: Secondary | ICD-10-CM

## 2024-06-27 DIAGNOSIS — N3289 Other specified disorders of bladder: Secondary | ICD-10-CM | POA: Diagnosis not present

## 2024-06-27 DIAGNOSIS — J449 Chronic obstructive pulmonary disease, unspecified: Secondary | ICD-10-CM | POA: Diagnosis present

## 2024-06-27 DIAGNOSIS — R338 Other retention of urine: Secondary | ICD-10-CM | POA: Diagnosis present

## 2024-06-27 DIAGNOSIS — Z87891 Personal history of nicotine dependence: Secondary | ICD-10-CM

## 2024-06-27 DIAGNOSIS — N401 Enlarged prostate with lower urinary tract symptoms: Secondary | ICD-10-CM | POA: Diagnosis present

## 2024-06-27 DIAGNOSIS — E876 Hypokalemia: Secondary | ICD-10-CM | POA: Diagnosis present

## 2024-06-27 DIAGNOSIS — I10 Essential (primary) hypertension: Secondary | ICD-10-CM | POA: Diagnosis present

## 2024-06-27 DIAGNOSIS — I959 Hypotension, unspecified: Secondary | ICD-10-CM

## 2024-06-27 DIAGNOSIS — I2510 Atherosclerotic heart disease of native coronary artery without angina pectoris: Secondary | ICD-10-CM | POA: Diagnosis present

## 2024-06-27 DIAGNOSIS — Z7901 Long term (current) use of anticoagulants: Secondary | ICD-10-CM

## 2024-06-27 DIAGNOSIS — I129 Hypertensive chronic kidney disease with stage 1 through stage 4 chronic kidney disease, or unspecified chronic kidney disease: Secondary | ICD-10-CM | POA: Diagnosis present

## 2024-06-27 DIAGNOSIS — I4811 Longstanding persistent atrial fibrillation: Secondary | ICD-10-CM | POA: Diagnosis present

## 2024-06-27 DIAGNOSIS — K5641 Fecal impaction: Secondary | ICD-10-CM | POA: Diagnosis not present

## 2024-06-27 DIAGNOSIS — D649 Anemia, unspecified: Secondary | ICD-10-CM

## 2024-06-27 DIAGNOSIS — I9589 Other hypotension: Secondary | ICD-10-CM | POA: Diagnosis present

## 2024-06-27 DIAGNOSIS — N029 Recurrent and persistent hematuria with unspecified morphologic changes: Principal | ICD-10-CM

## 2024-06-27 DIAGNOSIS — Z885 Allergy status to narcotic agent status: Secondary | ICD-10-CM

## 2024-06-27 DIAGNOSIS — Z978 Presence of other specified devices: Secondary | ICD-10-CM

## 2024-06-27 DIAGNOSIS — E785 Hyperlipidemia, unspecified: Secondary | ICD-10-CM | POA: Diagnosis present

## 2024-06-27 DIAGNOSIS — E861 Hypovolemia: Secondary | ICD-10-CM | POA: Diagnosis present

## 2024-06-27 DIAGNOSIS — D6832 Hemorrhagic disorder due to extrinsic circulating anticoagulants: Secondary | ICD-10-CM | POA: Diagnosis present

## 2024-06-27 DIAGNOSIS — Z808 Family history of malignant neoplasm of other organs or systems: Secondary | ICD-10-CM

## 2024-06-27 DIAGNOSIS — G20A1 Parkinson's disease without dyskinesia, without mention of fluctuations: Secondary | ICD-10-CM | POA: Diagnosis present

## 2024-06-27 DIAGNOSIS — Z79899 Other long term (current) drug therapy: Secondary | ICD-10-CM

## 2024-06-27 DIAGNOSIS — Z8249 Family history of ischemic heart disease and other diseases of the circulatory system: Secondary | ICD-10-CM

## 2024-06-27 DIAGNOSIS — D62 Acute posthemorrhagic anemia: Secondary | ICD-10-CM | POA: Diagnosis present

## 2024-06-27 DIAGNOSIS — Z86007 Personal history of in-situ neoplasm of skin: Secondary | ICD-10-CM

## 2024-06-27 DIAGNOSIS — D72829 Elevated white blood cell count, unspecified: Secondary | ICD-10-CM

## 2024-06-27 DIAGNOSIS — I480 Paroxysmal atrial fibrillation: Secondary | ICD-10-CM

## 2024-06-27 HISTORY — PX: CYSTOSCOPY: SHX5120

## 2024-06-27 HISTORY — PX: CYSTOSCOPY WITH FULGERATION: SHX6638

## 2024-06-27 HISTORY — PX: CYSTOSCOPY WITH URETHRAL DILATATION: SHX5125

## 2024-06-27 LAB — CBC WITH DIFFERENTIAL/PLATELET
Abs Immature Granulocytes: 0.17 K/uL — ABNORMAL HIGH (ref 0.00–0.07)
Abs Immature Granulocytes: 0.28 K/uL — ABNORMAL HIGH (ref 0.00–0.07)
Basophils Absolute: 0.1 K/uL (ref 0.0–0.1)
Basophils Absolute: 0.1 K/uL (ref 0.0–0.1)
Basophils Relative: 1 %
Basophils Relative: 0 %
Eosinophils Absolute: 0.2 K/uL (ref 0.0–0.5)
Eosinophils Absolute: 0.1 K/uL (ref 0.0–0.5)
Eosinophils Relative: 1 %
Eosinophils Relative: 1 %
HCT: 29.7 % — ABNORMAL LOW (ref 39.0–52.0)
HCT: 24.9 % — ABNORMAL LOW (ref 39.0–52.0)
Hemoglobin: 9.2 g/dL — ABNORMAL LOW (ref 13.0–17.0)
Hemoglobin: 7.8 g/dL — ABNORMAL LOW (ref 13.0–17.0)
Immature Granulocytes: 1 %
Immature Granulocytes: 2 %
Lymphocytes Relative: 10 %
Lymphocytes Relative: 3 %
Lymphs Abs: 1.2 K/uL (ref 0.7–4.0)
Lymphs Abs: 0.6 K/uL — ABNORMAL LOW (ref 0.7–4.0)
MCH: 29.3 pg (ref 26.0–34.0)
MCH: 29.9 pg (ref 26.0–34.0)
MCHC: 31 g/dL (ref 30.0–36.0)
MCHC: 31.3 g/dL (ref 30.0–36.0)
MCV: 94.6 fL (ref 80.0–100.0)
MCV: 95.4 fL (ref 80.0–100.0)
Monocytes Absolute: 1 K/uL (ref 0.1–1.0)
Monocytes Absolute: 0.5 K/uL (ref 0.1–1.0)
Monocytes Relative: 8 %
Monocytes Relative: 3 %
Neutro Abs: 9.8 K/uL — ABNORMAL HIGH (ref 1.7–7.7)
Neutro Abs: 16.2 K/uL — ABNORMAL HIGH (ref 1.7–7.7)
Neutrophils Relative %: 79 %
Neutrophils Relative %: 91 %
Platelets: 357 K/uL (ref 150–400)
Platelets: 275 K/uL (ref 150–400)
RBC: 3.14 MIL/uL — ABNORMAL LOW (ref 4.22–5.81)
RBC: 2.61 MIL/uL — ABNORMAL LOW (ref 4.22–5.81)
RDW: 15.1 % (ref 11.5–15.5)
RDW: 14.9 % (ref 11.5–15.5)
WBC: 12.4 K/uL — ABNORMAL HIGH (ref 4.0–10.5)
WBC: 17.8 K/uL — ABNORMAL HIGH (ref 4.0–10.5)
nRBC: 0 % (ref 0.0–0.2)
nRBC: 0 % (ref 0.0–0.2)

## 2024-06-27 LAB — I-STAT CHEM 8, ED
BUN: 19 mg/dL (ref 8–23)
Calcium, Ion: 1.16 mmol/L (ref 1.15–1.40)
Chloride: 101 mmol/L (ref 98–111)
Creatinine, Ser: 1.2 mg/dL (ref 0.61–1.24)
Glucose, Bld: 125 mg/dL — ABNORMAL HIGH (ref 70–99)
HCT: 26 % — ABNORMAL LOW (ref 39.0–52.0)
Hemoglobin: 8.8 g/dL — ABNORMAL LOW (ref 13.0–17.0)
Potassium: 4 mmol/L (ref 3.5–5.1)
Sodium: 138 mmol/L (ref 135–145)
TCO2: 25 mmol/L (ref 22–32)

## 2024-06-27 LAB — COMPREHENSIVE METABOLIC PANEL WITH GFR
ALT: <5 U/L (ref 0–44)
AST: 19 U/L (ref 15–41)
Albumin: 3.6 g/dL (ref 3.5–5.0)
Alkaline Phosphatase: 154 U/L — ABNORMAL HIGH (ref 38–126)
Anion gap: 10 (ref 5–15)
BUN: 18 mg/dL (ref 8–23)
CO2: 25 mmol/L (ref 22–32)
Calcium: 9.3 mg/dL (ref 8.9–10.3)
Chloride: 104 mmol/L (ref 98–111)
Creatinine, Ser: 1.04 mg/dL (ref 0.61–1.24)
GFR, Estimated: >60 mL/min
Glucose, Bld: 135 mg/dL — ABNORMAL HIGH (ref 70–99)
Potassium: 4.2 mmol/L (ref 3.5–5.1)
Sodium: 139 mmol/L (ref 135–145)
Total Bilirubin: 0.5 mg/dL (ref 0.0–1.2)
Total Protein: 6.3 g/dL — ABNORMAL LOW (ref 6.5–8.1)

## 2024-06-27 LAB — PROTIME-INR
INR: 1.3 — ABNORMAL HIGH (ref 0.8–1.2)
Prothrombin Time: 16.9 s — ABNORMAL HIGH (ref 11.4–15.2)

## 2024-06-27 LAB — PROCALCITONIN: Procalcitonin: 0.49 ng/mL

## 2024-06-27 LAB — I-STAT CG4 LACTIC ACID, ED: Lactic Acid, Venous: 1.4 mmol/L (ref 0.5–1.9)

## 2024-06-27 MED ORDER — AMIODARONE HCL IN DEXTROSE 360-4.14 MG/200ML-% IV SOLN
30.0000 mg/h | INTRAVENOUS | Status: DC
  Administered 2024-06-28: 30 mg/h via INTRAVENOUS
  Filled 2024-06-27: qty 200

## 2024-06-27 MED ORDER — OXYCODONE HCL 5 MG/5ML PO SOLN: 5.0000 mg | Freq: Once | ORAL | Status: DC | PRN

## 2024-06-27 MED ORDER — FINASTERIDE 5 MG PO TABS
5.0000 mg | ORAL_TABLET | Freq: Every day | ORAL | Status: DC
  Administered 2024-06-28 – 2024-07-03 (×6): 5 mg via ORAL
  Filled 2024-06-27 (×6): qty 1

## 2024-06-27 MED ORDER — ACETAMINOPHEN 325 MG PO TABS
650.0000 mg | ORAL_TABLET | Freq: Four times a day (QID) | ORAL | Status: DC | PRN
  Administered 2024-07-02 (×2): 650 mg via ORAL
  Filled 2024-06-27 (×3): qty 2

## 2024-06-27 MED ORDER — LIDOCAINE HCL (CARDIAC) PF 100 MG/5ML IV SOSY
PREFILLED_SYRINGE | INTRAVENOUS | Status: DC | PRN
  Administered 2024-06-27: 60 mg via INTRAVENOUS

## 2024-06-27 MED ORDER — FENTANYL CITRATE (PF) 50 MCG/ML IJ SOSY: 25.0000 ug | PREFILLED_SYRINGE | INTRAMUSCULAR | Status: DC | PRN

## 2024-06-27 MED ORDER — CARBIDOPA-LEVODOPA 25-100 MG PO TBDP: 2.0000 | ORAL_TABLET | Freq: Every morning | ORAL | Status: DC

## 2024-06-27 MED ORDER — VASOPRESSIN 20 UNIT/ML IV SOLN
INTRAVENOUS | Status: DC | PRN
  Administered 2024-06-27 (×2): 1 [IU] via INTRAVENOUS

## 2024-06-27 MED ORDER — LACTATED RINGERS IV BOLUS (SEPSIS)
1000.0000 mL | Freq: Once | INTRAVENOUS | Status: AC
  Administered 2024-06-27: 1000 mL via INTRAVENOUS

## 2024-06-27 MED ORDER — SODIUM CHLORIDE 0.9 % IR SOLN
Status: DC | PRN
  Administered 2024-06-27 (×3): 3000 mL via INTRAVESICAL

## 2024-06-27 MED ORDER — LACTATED RINGERS IV SOLN: INTRAVENOUS | Status: DC | PRN

## 2024-06-27 MED ORDER — AMIODARONE HCL IN DEXTROSE 360-4.14 MG/200ML-% IV SOLN
60.0000 mg/h | INTRAVENOUS | Status: DC
  Administered 2024-06-27 (×2): 60 mg/h via INTRAVENOUS
  Filled 2024-06-27 (×2): qty 200

## 2024-06-27 MED ORDER — MAGNESIUM OXIDE -MG SUPPLEMENT 400 (240 MG) MG PO TABS
400.0000 mg | ORAL_TABLET | Freq: Every evening | ORAL | Status: DC
  Administered 2024-06-27 – 2024-07-02 (×5): 400 mg via ORAL
  Filled 2024-06-27 (×6): qty 1

## 2024-06-27 MED ORDER — OXYCODONE HCL 5 MG PO TABS
5.0000 mg | ORAL_TABLET | ORAL | Status: DC | PRN
  Administered 2024-06-27 – 2024-07-03 (×5): 5 mg via ORAL
  Filled 2024-06-27 (×7): qty 1

## 2024-06-27 MED ORDER — OXYCODONE HCL 5 MG PO TABS: 5.0000 mg | ORAL_TABLET | Freq: Once | ORAL | Status: DC | PRN

## 2024-06-27 MED ORDER — LACTATED RINGERS IV SOLN: INTRAVENOUS | Status: DC

## 2024-06-27 MED ORDER — CHLORHEXIDINE GLUCONATE CLOTH 2 % EX PADS
6.0000 | MEDICATED_PAD | Freq: Every day | CUTANEOUS | Status: DC
  Administered 2024-06-27 – 2024-07-03 (×7): 6 via TOPICAL

## 2024-06-27 MED ORDER — ORAL CARE MOUTH RINSE: 15.0000 mL | OROMUCOSAL | Status: DC

## 2024-06-27 MED ORDER — LACTATED RINGERS IV BOLUS (SEPSIS): 250.0000 mL | Freq: Once | INTRAVENOUS | Status: DC

## 2024-06-27 MED ORDER — TAMSULOSIN HCL 0.4 MG PO CAPS
0.4000 mg | ORAL_CAPSULE | Freq: Every day | ORAL | Status: DC
  Administered 2024-06-27 – 2024-07-02 (×6): 0.4 mg via ORAL
  Filled 2024-06-27 (×6): qty 1

## 2024-06-27 MED ORDER — FENTANYL CITRATE (PF) 100 MCG/2ML IJ SOLN
INTRAMUSCULAR | Status: AC
  Filled 2024-06-27: qty 2

## 2024-06-27 MED ORDER — CARBIDOPA-LEVODOPA 25-250 MG PO TABS
1.0000 | ORAL_TABLET | Freq: Three times a day (TID) | ORAL | Status: DC
  Administered 2024-06-27 – 2024-07-03 (×18): 1 via ORAL
  Filled 2024-06-27 (×19): qty 1

## 2024-06-27 MED ORDER — PROPOFOL 10 MG/ML IV BOLUS
INTRAVENOUS | Status: DC | PRN
  Administered 2024-06-27: 80 mg via INTRAVENOUS

## 2024-06-27 MED ORDER — SUCCINYLCHOLINE CHLORIDE 200 MG/10ML IV SOSY
PREFILLED_SYRINGE | INTRAVENOUS | Status: DC | PRN
  Administered 2024-06-27: 100 mg via INTRAVENOUS

## 2024-06-27 MED ORDER — ORAL CARE MOUTH RINSE: 15.0000 mL | OROMUCOSAL | Status: DC | PRN

## 2024-06-27 MED ORDER — PHENYLEPHRINE HCL (PRESSORS) 10 MG/ML IV SOLN
INTRAVENOUS | Status: DC | PRN
  Administered 2024-06-27 (×2): 320 ug via INTRAVENOUS

## 2024-06-27 MED ORDER — DEXAMETHASONE SOD PHOSPHATE PF 10 MG/ML IJ SOLN
INTRAMUSCULAR | Status: DC | PRN
  Administered 2024-06-27: 5 mg via INTRAVENOUS

## 2024-06-27 MED ORDER — CARBIDOPA-LEVODOPA 25-100 MG PO TABS
2.0000 | ORAL_TABLET | Freq: Every day | ORAL | Status: DC
  Administered 2024-06-29 – 2024-07-03 (×5): 2 via ORAL
  Filled 2024-06-27 (×6): qty 2

## 2024-06-27 MED ORDER — BELLADONNA ALKALOIDS-OPIUM 16.2-60 MG RE SUPP
1.0000 | Freq: Four times a day (QID) | RECTAL | Status: DC | PRN
  Administered 2024-06-28 (×2): 1 via RECTAL
  Filled 2024-06-27 (×2): qty 1

## 2024-06-27 MED ORDER — BISACODYL 5 MG PO TBEC
10.0000 mg | DELAYED_RELEASE_TABLET | Freq: Every day | ORAL | Status: DC
  Administered 2024-06-28 – 2024-07-03 (×6): 10 mg via ORAL
  Filled 2024-06-27 (×6): qty 2

## 2024-06-27 MED ORDER — SODIUM CHLORIDE 0.9 % IV SOLN
2.0000 g | Freq: Once | INTRAVENOUS | Status: AC
  Administered 2024-06-27: 2 g via INTRAVENOUS
  Filled 2024-06-27: qty 12.5

## 2024-06-27 MED ORDER — PROTHROMBIN COMPLEX CONC HUMAN 500 UNITS IV KIT
3585.0000 [IU] | PACK | Status: AC
  Administered 2024-06-27: 3585 [IU] via INTRAVENOUS
  Filled 2024-06-27: qty 3585

## 2024-06-27 MED ORDER — VASOPRESSIN 20 UNIT/ML IV SOLN
INTRAVENOUS | Status: AC
  Filled 2024-06-27: qty 1

## 2024-06-27 MED ORDER — DEXAMETHASONE SOD PHOSPHATE PF 10 MG/ML IJ SOLN
INTRAMUSCULAR | Status: AC
  Filled 2024-06-27: qty 1

## 2024-06-27 MED ORDER — SODIUM CHLORIDE 0.9 % IR SOLN
3000.0000 mL | Status: DC
  Administered 2024-06-27: 3000 mL

## 2024-06-27 MED ORDER — FENTANYL CITRATE (PF) 100 MCG/2ML IJ SOLN
INTRAMUSCULAR | Status: DC | PRN
  Administered 2024-06-27: 25 ug via INTRAVENOUS
  Administered 2024-06-27: 50 ug via INTRAVENOUS
  Administered 2024-06-27: 25 ug via INTRAVENOUS

## 2024-06-27 MED ORDER — CARBIDOPA-LEVODOPA 25-250 MG PO TBDP: 1.0000 | ORAL_TABLET | Freq: Three times a day (TID) | ORAL | Status: DC

## 2024-06-27 MED ORDER — ONDANSETRON HCL 4 MG/2ML IJ SOLN
INTRAMUSCULAR | Status: DC | PRN
  Administered 2024-06-27: 4 mg via INTRAVENOUS

## 2024-06-27 MED ORDER — VANCOMYCIN HCL IN DEXTROSE 1-5 GM/200ML-% IV SOLN: 1000.0000 mg | Freq: Once | INTRAVENOUS | Status: DC

## 2024-06-27 MED ORDER — SODIUM CHLORIDE 0.9 % IV BOLUS
500.0000 mL | Freq: Once | INTRAVENOUS | Status: AC
  Administered 2024-06-27: 500 mL via INTRAVENOUS

## 2024-06-27 MED ORDER — ACETAMINOPHEN 650 MG RE SUPP: 650.0000 mg | Freq: Four times a day (QID) | RECTAL | Status: DC | PRN

## 2024-06-27 MED ORDER — POLYETHYLENE GLYCOL 3350 17 G PO PACK
17.0000 g | PACK | Freq: Every day | ORAL | Status: DC
  Administered 2024-06-28 – 2024-07-03 (×5): 17 g via ORAL
  Filled 2024-06-27 (×6): qty 1

## 2024-06-27 MED ORDER — VANCOMYCIN HCL 1500 MG/300ML IV SOLN
1500.0000 mg | Freq: Once | INTRAVENOUS | Status: DC
  Filled 2024-06-27: qty 300

## 2024-06-27 MED ORDER — ONDANSETRON HCL 4 MG/2ML IJ SOLN: 4.0000 mg | Freq: Once | INTRAMUSCULAR | Status: DC | PRN

## 2024-06-27 MED ORDER — AMIODARONE LOAD VIA INFUSION
150.0000 mg | Freq: Once | INTRAVENOUS | Status: AC
  Administered 2024-06-27: 150 mg via INTRAVENOUS
  Filled 2024-06-27: qty 83.34

## 2024-06-27 MED ORDER — SACCHAROMYCES BOULARDII 250 MG PO CAPS
250.0000 mg | ORAL_CAPSULE | Freq: Two times a day (BID) | ORAL | Status: DC
  Administered 2024-06-27 – 2024-07-03 (×12): 250 mg via ORAL
  Filled 2024-06-27 (×12): qty 1

## 2024-06-27 MED ORDER — METRONIDAZOLE 500 MG/100ML IV SOLN
500.0000 mg | Freq: Once | INTRAVENOUS | Status: AC
  Administered 2024-06-27: 500 mg via INTRAVENOUS
  Filled 2024-06-27: qty 100

## 2024-06-27 MED ORDER — ONDANSETRON HCL 4 MG/2ML IJ SOLN
INTRAMUSCULAR | Status: AC
  Filled 2024-06-27: qty 2

## 2024-06-27 MED ORDER — MIRABEGRON ER 25 MG PO TB24
50.0000 mg | ORAL_TABLET | Freq: Every day | ORAL | Status: DC
  Administered 2024-06-27 – 2024-07-03 (×7): 50 mg via ORAL
  Filled 2024-06-27 (×8): qty 2

## 2024-06-27 MED ORDER — DILTIAZEM HCL-DEXTROSE 125-5 MG/125ML-% IV SOLN (PREMIX)
5.0000 mg/h | INTRAVENOUS | Status: DC
  Filled 2024-06-27: qty 125

## 2024-06-27 NOTE — Assessment & Plan Note (Signed)
 Stable, no s/sx of exacerbation  On on home inhalers

## 2024-06-27 NOTE — Assessment & Plan Note (Addendum)
 Now s/p suprapubic catheter Continue flomax  and proscar 

## 2024-06-27 NOTE — ED Notes (Signed)
 Writer was at shift change with nurse. Writer noticed pt was hypotensive and tachycardic. Writer got Dr. Rogelia along with PA Dorn to reassess pt.

## 2024-06-27 NOTE — ED Notes (Signed)
 Placed on cardiac monitor per Dorn PA while in room getting vital signs on pt

## 2024-06-27 NOTE — Assessment & Plan Note (Addendum)
 While in ED heart rate labile going over 200 then down to 80s.  Blood pressure labile and soft at times Asymptomatic Started amiodarone  for rate control Consulted cardiology, agree with above Watch closely in stepdown to make sure doesn't brady while on amio Check echo Eliquis  reversed due to hemorrhage in bladder  Hold cardizem  with soft blood pressures, had dose today

## 2024-06-27 NOTE — ED Provider Notes (Signed)
 " [place] EMERGENCY DEPARTMENT AT [facility] Provider Note   CSN: [id number] Arrival date & time: 06/27/24  1430     Patient presents with: Hematuria   Michael Odonnell is a 84 y.o. male who is brought in by EMS today for hematuria through catheter, does have a suprapubic catheter in place, Surgical notes note that this was placed on 18 June 2024.  Subsequent readmission for hematuria with discharge on 15 January, discharged to SNF where he resides.  He also has increased shortness of breath and generalized weakness.  Previous CBC on 13 January showed hemoglobin of 8.9.  Primary concern and reason for him coming to the ED today was bright red blood in the Foley collection bag, noted in the catheter, with some bleeding noted around the catheter site.    Hematuria Associated symptoms include shortness of breath.       Prior to Admission medications  Medication Sig Start Date End Date Taking? Authorizing Provider  acetaminophen  (TYLENOL ) 500 MG tablet Take 1,000 mg by mouth every 6 (six) hours as needed for mild pain or headache.    Yes [provider]  apixaban  (ELIQUIS ) 5 MG TABS tablet TAKE 1 TABLET BY MOUTH TWICE  DAILY Patient taking differently: Take 5 mg by mouth every 12 (twelve) hours. 02/13/24  Yes Pietro Redell RAMAN, MD  bisacodyl  (DULCOLAX) 5 MG EC tablet Take 2 tablets (10 mg total) by mouth daily at 6 (six) AM. 06/23/24  Yes Tobie Yetta HERO, MD  calcium  carbonate (TUMS - DOSED IN MG ELEMENTAL CALCIUM ) 500 MG chewable tablet Chew 2 tablets by mouth daily as needed for indigestion or heartburn.   Yes [provider]  carbidopa -levodopa  (PARCOPA ) 25-100 MG disintegrating tablet Take 2 tablets by mouth every morning.   Yes [provider]  carbidopa -levodopa  (PARCOPA ) 25-250 MG disintegrating tablet Take 1 tablet by mouth 3 (three) times daily.   Yes [provider]  diltiazem  (TIAZAC ) 360 MG 24 hr capsule TAKE 1 CAPSULE BY MOUTH DAILY  05/19/24  Yes Crenshaw, Redell RAMAN, MD  finasteride  (PROSCAR ) 5 MG tablet Take 5 mg by mouth daily.   Yes [provider]  lisinopril  (ZESTRIL ) 20 MG tablet Take 20 mg by mouth daily.   Yes [provider]  magnesium  oxide (MAG-OX) 400 (240 Mg) MG tablet Take 400 mg by mouth daily in the afternoon.   Yes [provider]  methocarbamol  (ROBAXIN ) 500 MG tablet Take 1 tablet (500 mg total) by mouth every 6 (six) hours as needed for muscle spasms. 05/26/24  Yes Pokhrel, Laxman, MD  mirabegron  ER (MYRBETRIQ ) 50 MG TB24 tablet Take 50 mg by mouth daily at 4 PM.   Yes [provider]  Multiple Vitamins-Minerals (CENTRUM SILVER 50+MEN) TABS Take 1 tablet by mouth at bedtime.   Yes [provider]  niacin  500 MG tablet Take 500 mg by mouth every 12 (twelve) hours.   Yes [provider]  oxyCODONE  (OXY IR/ROXICODONE ) 5 MG immediate release tablet Take 1 tablet (5 mg total) by mouth every 6 (six) hours as needed for moderate pain (pain score 4-6) or severe pain (pain score 7-10). Patient taking differently: Take 5 mg by mouth every 4 (four) hours as needed for moderate pain (pain score 4-6) or severe pain (pain score 7-10). 06/25/24  Yes Samtani, Jai-Gurmukh, MD  polyethylene glycol powder (GLYCOLAX /MIRALAX ) 17 GM/SCOOP powder Take 17 g by mouth daily. Dissolve 1 capful (17g) in 4-8 ounces of liquid and take by mouth  daily. 06/23/24  Yes Tobie Yetta HERO, MD  saccharomyces boulardii (FLORASTOR) 250 MG capsule Take 250 mg by mouth 2 (two) times daily.   Yes [provider]  Skin Protectants, Misc. (CARRINGTON MOIST BARRIER/ZINC EX) Apply 1 application  topically See admin instructions. Apply to buttocks after each episode of incontinence- after first cleansing and drying   Yes [provider]  tamsulosin  (FLOMAX ) 0.4 MG CAPS capsule Take 1 capsule (0.4 mg total) by mouth daily. 12/22/16  Yes Mackuen, Courteney Lyn, MD  Nutritional Supplements  (NUTRITIONAL DRINK) LIQD Take 120 mLs by mouth See admin instructions. MedPass - Drink 120 ml's by mouth 2 times a day    [provider]    Allergies: Hydrochlorothiazide, Codeine, Doxycycline, Metronidazole , and Mysoline [primidone]    Review of Systems  Respiratory:  Positive for shortness of breath.   Genitourinary:  Positive for hematuria.  All other systems reviewed and are negative.   Updated Vital Signs BP (!) 112/93   Pulse 86   Temp (!) 97.5 F (36.4 C) (Oral)   Resp (!) 38   Ht 5' 9 (1.753 m)   Wt 74 kg   SpO2 98%   BMI 24.09 kg/m   Physical Exam Vitals and nursing note reviewed. Exam conducted with a chaperone present.  Constitutional:      General: He is awake. He is not in acute distress.    Appearance: Normal appearance.  HENT:     Head: Normocephalic and atraumatic.     Mouth/Throat:     Mouth: Mucous membranes are moist.     Pharynx: Oropharynx is clear. Uvula midline.  Eyes:     General: Lids are normal. Vision grossly intact. Gaze aligned appropriately.     Extraocular Movements: Extraocular movements intact.     Conjunctiva/sclera: Conjunctivae normal.     Pupils: Pupils are equal, round, and reactive to light.     Comments: Bilateral paleness of the conjunctive appreciated.  Cardiovascular:     Rate and Rhythm: Normal rate and regular rhythm.     Pulses: Normal pulses.     Heart sounds: Normal heart sounds. No murmur heard.    No friction rub. No gallop.  Pulmonary:     Effort: Pulmonary effort is normal.     Breath sounds: Normal breath sounds.  Abdominal:     General: Abdomen is flat. The ostomy site is clean. Bowel sounds are normal.     Palpations: Abdomen is soft.     Tenderness: There is abdominal tenderness in the right lower quadrant, periumbilical area and suprapubic area.      Comments: Noted suprapubic catheter, surgical site is normal-appearing with no exudate appreciated currently.  Genitourinary:    Penis: Normal.       Comments: Foley catheter in place, noted bright red liquid in the Foley collection catheter/bag. Musculoskeletal:        General: Normal range of motion.     Cervical back: Normal range of motion and neck supple.     Right lower leg: No edema.     Left lower leg: No edema.  Skin:    General: Skin is warm and dry.     Capillary Refill: Capillary refill takes less than 2 seconds.  Neurological:     General: No focal deficit present.     Mental Status: He is alert. Mental status is at baseline.  Psychiatric:        Mood and Affect: Mood normal.  Behavior: Behavior is cooperative.     (all labs ordered are listed, but only abnormal results are displayed) Labs Reviewed  COMPREHENSIVE METABOLIC PANEL WITH GFR - Abnormal; Notable for the following components:      Result Value   Glucose, Bld 135 (*)    Total Protein 6.3 (*)    Alkaline Phosphatase 154 (*)    All other components within normal limits  CBC WITH DIFFERENTIAL/PLATELET - Abnormal; Notable for the following components:   WBC 12.4 (*)    RBC 3.14 (*)    Hemoglobin 9.2 (*)    HCT 29.7 (*)    Neutro Abs 9.8 (*)    Abs Immature Granulocytes 0.17 (*)    All other components within normal limits  PROTIME-INR - Abnormal; Notable for the following components:   Prothrombin  Time 16.9 (*)    INR 1.3 (*)    All other components within normal limits  I-STAT CHEM 8, ED - Abnormal; Notable for the following components:   Glucose, Bld 125 (*)    Hemoglobin 8.8 (*)    HCT 26.0 (*)    All other components within normal limits  CULTURE, BLOOD (ROUTINE X 2)  CULTURE, BLOOD (ROUTINE X 2)  URINALYSIS, ROUTINE W REFLEX MICROSCOPIC  PROCALCITONIN  PROCALCITONIN  I-STAT CG4 LACTIC ACID, ED  I-STAT CG4 LACTIC ACID, ED  TYPE AND SCREEN    EKG: None  Radiology: US  Pelvis Limited Result Date: 06/27/2024 EXAM: PELVIC ULTRASOUND 06/27/2024 04:03:30 PM TECHNIQUE: Transabdominal pelvic duplex ultrasound using B-mode/gray scaled  imaging with Doppler spectral analysis and color flow was obtained. COMPARISON: CT abdomen and pelvis 06/18/2024. CLINICAL HISTORY: assess for urinary retention/clot burden in bladder. FINDINGS: ULTRASOUND FINDINGS: UTERUS: Not applicable as the patient is male. ENDOMETRIAL STRIPE: Not applicable as the patient is male. RIGHT OVARY: Not applicable as the patient is male. LEFT OVARY: Not applicable as the patient is male. BLADDER: Foley catheter in the bladder. There is a moderate to large amount of heterogeneous nonvascular echogenic material within the bladder surrounding the foley catheter, possibly related to hemorrhage. FREE FLUID: No free fluid. IMPRESSION: 1. Moderate to large amount of heterogeneous nonvascular echogenic material within the urinary bladder surrounding the Foley catheter, compatible with intravesical blood clot/hemorrhage. Electronically signed by: Greig Pique MD 06/27/2024 04:26 PM EST RP Workstation: [id number]     Procedures   Medications Ordered in the ED  lactated ringers  infusion (has no administration in time range)  lactated ringers  bolus 1,000 mL (0 mLs Intravenous Stopped 06/27/24 1637)    And  lactated ringers  bolus 1,000 mL (1,000 mLs Intravenous New Bag/Given 06/27/24 1734)  prothrombin  complex conc human (KCENTRA ) IVPB 3,585 Units (has no administration in time range)  sodium chloride  0.9 % bolus 500 mL (0 mLs Intravenous Stopped 06/27/24 1618)  ceFEPIme  (MAXIPIME ) 2 g in sodium chloride  0.9 % 100 mL IVPB (0 g Intravenous Stopped 06/27/24 1629)  metroNIDAZOLE  (FLAGYL ) IVPB 500 mg (0 mg Intravenous Stopped 06/27/24 1724)                                    Medical Decision Making Amount and/or Complexity of Data Reviewed Labs: ordered. Radiology: ordered.  Risk Prescription drug management. Decision regarding hospitalization.   Medical Decision Making:   Jance Siek is a 84 y.o. male who presented to the ED today with hematuria detailed above.     Additional history discussed with patient's family/caregivers.  External  chart has been reviewed including previous labs, imaging, admission records. Patient's presentation is complicated by their history of gross hematuria, obliterated membranous urethra, paroxysmal A-fib.  Patient placed on continuous vitals and telemetry monitoring while in ED which was reviewed periodically.  Complete initial physical exam performed, notably the patient  was alert and oriented at his baseline and in no apparent distress.  Does have some shortness of breath, abdominal tenderness. Reviewed and confirmed nursing documentation for past medical history, family history, social history.    Initial Assessment:   With the patient's presentation of hematuria, most likely diagnosis is continued complications related to previous suprapubic catheter placement.  Furthermore, given his persistent hemorrhage, consider profound anemia, hypovolemia, also consider intra-abdominal hemorrhage, bladder outlet obstruction. This is most consistent with an acute life/limb threatening illness complicated by underlying chronic conditions.  Initial Plan:  Ultrasound of the pelvis to evaluate for presence of obstruction/clots in the bladder. Screening labs including CBC and Metabolic panel to evaluate for infectious or metabolic etiology of disease.  Urinalysis with reflex culture ordered to evaluate for UTI or relevant urologic/nephrologic pathology.  Type and screen secondary to possible need for blood products given persistent hemorrhage. EKG to evaluate for cardiac pathology Objective evaluation as below reviewed   Initial Study Results:   Laboratory  All laboratory results reviewed without evidence of clinically relevant pathology.   Exceptions include: Decreased hemoglobin to 9.2, however this is unchanged to previous.  White cell count is elevated to 12.4.  Alkaline phosphatase elevated at 154, INR is 1.3.  EKG EKG was  reviewed independently. Rate, rhythm, axis, intervals all examined and without medically relevant abnormality. ST segments without concerns for elevations.    Radiology:  All images reviewed independently. Agree with radiology report at this time.   US  Pelvis Limited Result Date: 06/27/2024 EXAM: PELVIC ULTRASOUND 06/27/2024 04:03:30 PM TECHNIQUE: Transabdominal pelvic duplex ultrasound using B-mode/gray scaled imaging with Doppler spectral analysis and color flow was obtained. COMPARISON: CT abdomen and pelvis 06/18/2024. CLINICAL HISTORY: assess for urinary retention/clot burden in bladder. FINDINGS: ULTRASOUND FINDINGS: UTERUS: Not applicable as the patient is male. ENDOMETRIAL STRIPE: Not applicable as the patient is male. RIGHT OVARY: Not applicable as the patient is male. LEFT OVARY: Not applicable as the patient is male. BLADDER: Foley catheter in the bladder. There is a moderate to large amount of heterogeneous nonvascular echogenic material within the bladder surrounding the foley catheter, possibly related to hemorrhage. FREE FLUID: No free fluid. IMPRESSION: 1. Moderate to large amount of heterogeneous nonvascular echogenic material within the urinary bladder surrounding the Foley catheter, compatible with intravesical blood clot/hemorrhage. Electronically signed by: Greig Pique MD 06/27/2024 04:26 PM EST RP Workstation: [id number]     Consults: Case discussed with Dr. Watt with the urology team, Dr. Waddell with the hospitalist team.   Reassessment and Plan:   During his time here in the ED, patient became profoundly hypotensive, and fluid bolus initiated.  He responded well to fluids with blood pressure rebounding with MAP greater than 70.  Further, ultrasound does show large volume of clots in the bladder.  Discussed this with urology team as noted, they will be seeing this patient with potential return to the OR, discussed this with the family and the patient who understands and agrees.   Discussed with hospitalist team for admission.    Also, per Dr. Rogelia, patient was started on IV antibiotics for possible sepsis at that time, also blood cultures and serum lactate were obtained.  Serum lactate is  not elevated, further large-volume fluid bolus was administered per Dr. Jonda order.  After consultation with hospitalist team, fluids were discontinued.  Further, patient had noted tachycardia on the monitor, history of atrial fibrillation, noted to sustain rhythm greater than 150 on the monitor.  Initially placed patient on diltiazem  drip  EKG that shows a sustained rate of around 80-85.  Diltiazem  discontinued.  At this time, hospitalist accepts for admission for urology following and likely urologic intervention.       Final diagnoses:  Hypotension due to hypovolemia  Longstanding persistent atrial fibrillation [facility])  Recurrent gross hematuria    ED Discharge Orders     None          Myriam Dorn BROCKS, [place] 06/27/24 1756  "

## 2024-06-27 NOTE — Transfer of Care (Signed)
 Immediate Anesthesia Transfer of Care Note  Patient: Michael Odonnell  Procedure(s) Performed: CYSTOSCOPY INSERTION, SUPRAPUBIC CATHETER  Patient Location: PACU  Anesthesia Type:General  Level of Consciousness: awake and alert   Airway & Oxygen Therapy: Patient Spontanous Breathing and Patient connected to face mask oxygen  Post-op Assessment: Report given to RN and Post -op Vital signs reviewed and stable  Post vital signs: Reviewed and stable  Last Vitals:  Vitals Value Taken Time  BP 126/56 06/27/24 20:45  Temp 36.7 C 06/27/24 20:45  Pulse 61 06/27/24 20:47  Resp 28 06/27/24 20:47  SpO2 100 % 06/27/24 20:47  Vitals shown include unfiled device data.  Last Pain:  Vitals:   06/27/24 1445  TempSrc: Oral  PainSc:          Complications: No notable events documented.

## 2024-06-27 NOTE — Anesthesia Procedure Notes (Signed)
 Procedure Name: Intubation Date/Time: 06/27/2024 7:37 PM  Performed by: Dartha Meckel, CRNAPre-anesthesia Checklist: Patient identified, Emergency Drugs available, Suction available and Patient being monitored Patient Re-evaluated:Patient Re-evaluated prior to induction Oxygen Delivery Method: Circle system utilized Preoxygenation: Pre-oxygenation with 100% oxygen Induction Type: IV induction Ventilation: Mask ventilation without difficulty Laryngoscope Size: Mac and 4 Grade View: Grade I Tube type: Oral Tube size: 7.5 mm Number of attempts: 1 Airway Equipment and Method: Stylet and Oral airway Placement Confirmation: ETT inserted through vocal cords under direct vision, positive ETCO2 and breath sounds checked- equal and bilateral Secured at: 22 cm Tube secured with: Tape Dental Injury: Teeth and Oropharynx as per pre-operative assessment

## 2024-06-27 NOTE — Assessment & Plan Note (Signed)
At baseline, monitor ° °

## 2024-06-27 NOTE — Assessment & Plan Note (Signed)
 No fever, tachypnea and initially no tachycardia or other SIRS criteria Received cefepime , flagly in ED. I cancelled vanc Lactic acid wnl CXR clear, still waiting on UA Check PCT and f/u on UA

## 2024-06-27 NOTE — Assessment & Plan Note (Signed)
 See #1 Foley care

## 2024-06-27 NOTE — Anesthesia Preprocedure Evaluation (Addendum)
"                                    Anesthesia Evaluation  Patient identified by MRN, date of birth, ID band Patient awake    Reviewed: Allergy & Precautions, NPO status , Patient's Chart, lab work & pertinent test results  History of Anesthesia Complications Negative for: history of anesthetic complications  Airway Mallampati: I  TM Distance: >3 FB Neck ROM: Full    Dental  (+) Dental Advisory Given, Missing   Pulmonary COPD, former smoker   Pulmonary exam normal        Cardiovascular hypertension, Pt. on medications + dysrhythmias Atrial Fibrillation  Rhythm:Irregular Rate:Tachycardia     Neuro/Psych  PSYCHIATRIC DISORDERS       Parkinson's disease   Neuromuscular disease    GI/Hepatic Neg liver ROS,GERD  Controlled,,  Endo/Other  negative endocrine ROS    Renal/GU CRFRenal disease     Musculoskeletal  (+) Arthritis ,    Abdominal   Peds  Hematology  (+) Blood dyscrasia, anemia  On eliquis     Anesthesia Other Findings   Reproductive/Obstetrics                              Anesthesia Physical Anesthesia Plan  ASA: 3 and emergent  Anesthesia Plan: General   Post-op Pain Management: Minimal or no pain anticipated   Induction: Intravenous and Rapid sequence  PONV Risk Score and Plan: 2 and Treatment may vary due to age or medical condition, Ondansetron  and Propofol  infusion  Airway Management Planned: Oral ETT  Additional Equipment: None  Intra-op Plan:   Post-operative Plan: Extubation in OR  Informed Consent: I have reviewed the patients History and Physical, chart, labs and discussed the procedure including the risks, benefits and alternatives for the proposed anesthesia with the patient or authorized representative who has indicated his/her understanding and acceptance.     Dental advisory given  Plan Discussed with: CRNA, Anesthesiologist and Surgeon  Anesthesia Plan Comments:           Anesthesia Quick Evaluation  "

## 2024-06-27 NOTE — Assessment & Plan Note (Signed)
 Niacin  only

## 2024-06-27 NOTE — Assessment & Plan Note (Signed)
 Continue Parcopa  Fall risk

## 2024-06-27 NOTE — ED Triage Notes (Signed)
 BIB EMS due to bleeding around cath. Bright red bleeding. Here on the 8th, had to go to OR to have it replaced. 140/p-98% RA -75   Pt resides [facility].

## 2024-06-27 NOTE — Consult Note (Signed)
 " Subjective: 1. Recurrent gross hematuria   2. Hypotension due to hypovolemia   3. Longstanding persistent atrial fibrillation Michael Odonnell)      Consult requested by Dorn Dec NP.   Michael Odonnell is an 84 yo male who had a 35fr SP tube placed on 06/18/24 by Dr. 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   He was started back on his Apixapan following the procedure and began to have bleeding today after PT in the nursing facility.  His foley is not draining in the ER and his bladder is palpable to his umbilicus.   My attempt at irrigation was unsuccessful.  He is having intermittent tachycardia to 180 with intermittent hypotension.   His Hgb is 8.8.  The bladder is full of clot on US .  He had a CT on 06/18/24 that showed a full bladder with a small prostate but a stool filled dilated rectum.  He is on finasteride , tamsulosin  and Myrbetriq .   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    Tremor     Past Surgical History:  Procedure Laterality Date   CLEFT LIP REPAIR     CLEFT PALATE REPAIR     CYSTOSCOPY N/A 06/18/2024   Procedure: CYSTOSCOPY, PLACEMENT OF SUPRAPUBIC CATHETER, FLEXIBLE CYSTOSCOPY;  Surgeon: Devere Lonni Righter, MD;  Location: [facility];  Service: Urology;  Laterality: N/A;   HIP PINNING,CANNULATED Left 05/23/2024   Procedure: PERCUTANEOUS FIXATION OF FEMURAL NECK;  Surgeon: Georgina Ozell LABOR, MD;  Location: [facility];  Service: Orthopedics;  Laterality: Left;   SKIN GRAFT FULL THICKNESS LEG  12/2018    Social History   Socioeconomic History   Marital status: Married    Spouse name: Not on file   Number of  children: 2   Years of education: Not on file   Highest education level: Bachelor's degree (e.g., BA, AB, BS)  Occupational History   Occupation: retired    Comment: aeronautical engineer - [employer]   Tobacco Use   Smoking status: Former    Current packs/day: 0.00    Types: Cigarettes    Quit date: 12/20/2012    Years since quitting: 11.5   Smokeless tobacco: Never  Vaping Use   Vaping status: Never Used  Substance and Sexual Activity   Alcohol use: No   Drug use: No   Sexual activity: Not Currently  Other Topics Concern   Not on file  Social History Narrative   Left hand   Leaving w/ wife, 1 story 2step.   Social Drivers of Health   Tobacco Use: Medium Risk (06/27/2024)   Patient History    Smoking Tobacco Use: Former    Smokeless Tobacco Use: Never    Passive Exposure: Not on file  Financial Resource Strain: Not on file  Food Insecurity: No Food Insecurity (06/18/2024)   Epic    Worried About Programme Researcher, Broadcasting/film/video in the Last Year: Never true    Ran Out of Food in the Last Year: Never true  Transportation Needs: No Transportation Needs (06/18/2024)   Epic  Lack of Transportation (Medical): No    Lack of Transportation (Non-Medical): No  Physical Activity: Not on file  Stress: Not on file  Social Connections: Patient Unable To Answer (06/18/2024)   Social Connection and Isolation Panel    Frequency of Communication with Friends and Family: Patient unable to answer    Frequency of Social Gatherings with Friends and Family: Patient unable to answer    Attends Religious Services: Patient unable to answer    Active Member of Clubs or Organizations: Patient unable to answer    Attends Banker Meetings: Patient unable to answer    Marital Status: Patient unable to answer  Recent Concern: Social Connections - Moderately Isolated (05/23/2024)   Social Connection and Isolation Panel    Frequency of Communication with Friends and Family: More than  three times a week    Frequency of Social Gatherings with Friends and Family: More than three times a week    Attends Religious Services: Never    Database Administrator or Organizations: No    Attends Banker Meetings: Never    Marital Status: Married  Catering Manager Violence: Not At Risk (06/18/2024)   Epic    Fear of Current or Ex-Partner: No    Emotionally Abused: No    Physically Abused: No    Sexually Abused: No  Depression (PHQ2-9): Not on file  Alcohol Screen: Not on file  Housing: Low Risk (06/18/2024)   Epic    Unable to Pay for Housing in the Last Year: No    Number of Times Moved in the Last Year: 1    Homeless in the Last Year: No  Utilities: Not At Risk (06/18/2024)   Epic    Threatened with loss of utilities: No  Health Literacy: Not on file    Family History  Problem Relation Age of Onset   Heart attack Mother 58   Heart disease Sister    Melanoma Sister    Heart attack Brother    Healthy Daughter    Healthy Daughter    Stomach cancer Neg Hx    Colon cancer Neg Hx     Anti-infectives: Anti-infectives (From admission, onward)    Start     Dose/Rate Route Frequency Ordered Stop   06/27/24 1600  ceFEPIme  (MAXIPIME ) 2 g in sodium chloride  0.9 % 100 mL IVPB        2 g 200 mL/hr over 30 Minutes Intravenous  Once 06/27/24 1558 06/27/24 1629   06/27/24 1600  metroNIDAZOLE  (FLAGYL ) IVPB 500 mg        500 mg 100 mL/hr over 60 Minutes Intravenous  Once 06/27/24 1558 06/27/24 1724   06/27/24 1600  vancomycin  (VANCOCIN ) IVPB 1000 mg/200 mL premix  Status:  Discontinued        1,000 mg 200 mL/hr over 60 Minutes Intravenous  Once 06/27/24 1558 06/27/24 1559   06/27/24 1600  vancomycin  (VANCOREADY) IVPB 1500 mg/300 mL  Status:  Discontinued        1,500 mg 150 mL/hr over 120 Minutes Intravenous  Once 06/27/24 1559 06/27/24 1738       Current Facility-Administered Medications  Medication Dose Route Frequency Provider Last Rate Last Admin   amiodarone   (NEXTERONE  PREMIX) 360-4.14 MG/200ML-% (1.8 mg/mL) IV infusion  60 mg/hr Intravenous Continuous Rogelia Jerilynn RAMAN, MD       Followed by   NOREEN ON 06/28/2024] amiodarone  (NEXTERONE  PREMIX) 360-4.14 MG/200ML-% (1.8 mg/mL) IV infusion  30 mg/hr Intravenous Continuous Rogelia Jerilynn  S, MD       amiodarone  (NEXTERONE ) 1.8 mg/mL load via infusion 150 mg  150 mg Intravenous Once Stanek, Lawrence S, MD       lactated ringers  infusion   Intravenous Continuous Waddell Rake, MD       Current Outpatient Medications  Medication Sig Dispense Refill   acetaminophen  (TYLENOL ) 500 MG tablet Take 1,000 mg by mouth every 6 (six) hours as needed for mild pain or headache.      apixaban  (ELIQUIS ) 5 MG TABS tablet TAKE 1 TABLET BY MOUTH TWICE  DAILY (Patient taking differently: Take 5 mg by mouth every 12 (twelve) hours.) 200 tablet 1   bisacodyl  (DULCOLAX) 5 MG EC tablet Take 2 tablets (10 mg total) by mouth daily at 6 (six) AM.     calcium  carbonate (TUMS - DOSED IN MG ELEMENTAL CALCIUM ) 500 MG chewable tablet Chew 2 tablets by mouth daily as needed for indigestion or heartburn.     carbidopa -levodopa  (PARCOPA ) 25-100 MG disintegrating tablet Take 2 tablets by mouth every morning.     carbidopa -levodopa  (PARCOPA ) 25-250 MG disintegrating tablet Take 1 tablet by mouth 3 (three) times daily.     diltiazem  (TIAZAC ) 360 MG 24 hr capsule TAKE 1 CAPSULE BY MOUTH DAILY 100 capsule 2   finasteride  (PROSCAR ) 5 MG tablet Take 5 mg by mouth daily.     lisinopril  (ZESTRIL ) 20 MG tablet Take 20 mg by mouth daily.     magnesium  oxide (MAG-OX) 400 (240 Mg) MG tablet Take 400 mg by mouth daily in the afternoon.     methocarbamol  (ROBAXIN ) 500 MG tablet Take 1 tablet (500 mg total) by mouth every 6 (six) hours as needed for muscle spasms.     mirabegron  ER (MYRBETRIQ ) 50 MG TB24 tablet Take 50 mg by mouth daily at 4 PM.     Multiple Vitamins-Minerals (CENTRUM SILVER 50+MEN) TABS Take 1 tablet by mouth at bedtime.     niacin   500 MG tablet Take 500 mg by mouth every 12 (twelve) hours.     oxyCODONE  (OXY IR/ROXICODONE ) 5 MG immediate release tablet Take 1 tablet (5 mg total) by mouth every 6 (six) hours as needed for moderate pain (pain score 4-6) or severe pain (pain score 7-10). (Patient taking differently: Take 5 mg by mouth every 4 (four) hours as needed for moderate pain (pain score 4-6) or severe pain (pain score 7-10).) 9 tablet 0   polyethylene glycol powder (GLYCOLAX /MIRALAX ) 17 GM/SCOOP powder Take 17 g by mouth daily. Dissolve 1 capful (17g) in 4-8 ounces of liquid and take by mouth daily. 238 g 0   saccharomyces boulardii (FLORASTOR) 250 MG capsule Take 250 mg by mouth 2 (two) times daily.     Skin Protectants, Misc. (CARRINGTON MOIST BARRIER/ZINC EX) Apply 1 application  topically See admin instructions. Apply to buttocks after each episode of incontinence- after first cleansing and drying     tamsulosin  (FLOMAX ) 0.4 MG CAPS capsule Take 1 capsule (0.4 mg total) by mouth daily. 30 capsule 0   Nutritional Supplements (NUTRITIONAL DRINK) LIQD Take 120 mLs by mouth See admin instructions. MedPass - Drink 120 ml's by mouth 2 times a day       Objective: Vital signs in last 24 hours: BP (!) 112/93   Pulse 86   Temp (!) 97.5 F (36.4 C) (Oral)   Resp (!) 38   Ht 5' 9 (1.753 m)   Wt 74 kg   SpO2 98%   BMI 24.09 kg/m  Intake/Output from previous day: No intake/output data recorded. Intake/Output this shift: No intake/output data recorded.   Physical Exam Vitals reviewed.  Constitutional:      General: He is in acute distress.     Appearance: He is ill-appearing.  Cardiovascular:     Rate and Rhythm: Tachycardia present.  Pulmonary:     Effort: Pulmonary effort is normal. No respiratory distress.  Abdominal:     General: Abdomen is flat.     Palpations: There is mass (suprapubic).     Tenderness: There is abdominal tenderness.     Comments: S/P tube in place with no bleeding on the dressing.    Musculoskeletal:        General: No tenderness. Normal range of motion.  Neurological:     Mental Status: He is oriented to person, place, and time.     Comments: tremoring     Lab Results:  Results for orders placed or performed during the hospital encounter of 06/27/24 (from the past 24 hours)  Comprehensive metabolic panel     Status: Abnormal   Collection Time: 06/27/24  4:00 PM  Result Value Ref Range   Sodium 139 135 - 145 mmol/L   Potassium 4.2 3.5 - 5.1 mmol/L   Chloride 104 98 - 111 mmol/L   CO2 25 22 - 32 mmol/L   Glucose, Bld 135 (H) 70 - 99 mg/dL   BUN 18 8 - 23 mg/dL   Creatinine, Ser 8.95 0.61 - 1.24 mg/dL   Calcium  9.3 8.9 - 10.3 mg/dL   Total Protein 6.3 (L) 6.5 - 8.1 g/dL   Albumin  3.6 3.5 - 5.0 g/dL   AST 19 15 - 41 U/L   ALT <5 0 - 44 U/L   Alkaline Phosphatase 154 (H) 38 - 126 U/L   Total Bilirubin 0.5 0.0 - 1.2 mg/dL   GFR, Estimated >39 >39 mL/min   Anion gap 10 5 - 15  CBC with Differential     Status: Abnormal   Collection Time: 06/27/24  4:00 PM  Result Value Ref Range   WBC 12.4 (H) 4.0 - 10.5 K/uL   RBC 3.14 (L) 4.22 - 5.81 MIL/uL   Hemoglobin 9.2 (L) 13.0 - 17.0 g/dL   HCT 70.2 (L) 60.9 - 47.9 %   MCV 94.6 80.0 - 100.0 fL   MCH 29.3 26.0 - 34.0 pg   MCHC 31.0 30.0 - 36.0 g/dL   RDW 84.8 88.4 - 84.4 %   Platelets 357 150 - 400 K/uL   nRBC 0.0 0.0 - 0.2 %   Neutrophils Relative % 79 %   Neutro Abs 9.8 (H) 1.7 - 7.7 K/uL   Lymphocytes Relative 10 %   Lymphs Abs 1.2 0.7 - 4.0 K/uL   Monocytes Relative 8 %   Monocytes Absolute 1.0 0.1 - 1.0 K/uL   Eosinophils Relative 1 %   Eosinophils Absolute 0.2 0.0 - 0.5 K/uL   Basophils Relative 1 %   Basophils Absolute 0.1 0.0 - 0.1 K/uL   Immature Granulocytes 1 %   Abs Immature Granulocytes 0.17 (H) 0.00 - 0.07 K/uL  Protime-INR     Status: Abnormal   Collection Time: 06/27/24  4:00 PM  Result Value Ref Range   Prothrombin  Time 16.9 (H) 11.4 - 15.2 seconds   INR 1.3 (H) 0.8 - 1.2  I-Stat CG4  Lactic Acid     Status: None   Collection Time: 06/27/24  4:15 PM  Result Value Ref Range   Lactic  Acid, Venous 1.4 0.5 - 1.9 mmol/L  I-stat chem 8, ED (not at [facility], [facility] or [facility])     Status: Abnormal   Collection Time: 06/27/24  4:16 PM  Result Value Ref Range   Sodium 138 135 - 145 mmol/L   Potassium 4.0 3.5 - 5.1 mmol/L   Chloride 101 98 - 111 mmol/L   BUN 19 8 - 23 mg/dL   Creatinine, Ser 8.79 0.61 - 1.24 mg/dL   Glucose, Bld 874 (H) 70 - 99 mg/dL   Calcium , Ion 1.16 1.15 - 1.40 mmol/L   TCO2 25 22 - 32 mmol/L   Hemoglobin 8.8 (L) 13.0 - 17.0 g/dL   HCT 73.9 (L) 60.9 - 47.9 %  Type and screen [facility]     Status: None   Collection Time: 06/27/24  4:19 PM  Result Value Ref Range   ABO/RH(D) A NEG    Antibody Screen NEG    Sample Expiration      06/30/2024,2359 Performed at [facility], [address] [place], [place] [address]     BMET Recent Labs    06/27/24 1600 06/27/24 1616  NA 139 138  K 4.2 4.0  CL 104 101  CO2 25  --   GLUCOSE 135* 125*  BUN 18 19  CREATININE 1.04 1.20  CALCIUM  9.3  --    PT/INR Recent Labs    06/27/24 1600  LABPROT 16.9*  INR 1.3*   ABG No results for input(s): PHART, HCO3 in the last 72 hours.  Invalid input(s): PCO2, PO2  Studies/Results: US  Pelvis Limited Result Date: 06/27/2024 EXAM: PELVIC ULTRASOUND 06/27/2024 04:03:30 PM TECHNIQUE: Transabdominal pelvic duplex ultrasound using B-mode/gray scaled imaging with Doppler spectral analysis and color flow was obtained. COMPARISON: CT abdomen and pelvis 06/18/2024. CLINICAL HISTORY: assess for urinary retention/clot burden in bladder. FINDINGS: ULTRASOUND FINDINGS: UTERUS: Not applicable as the patient is male. ENDOMETRIAL STRIPE: Not applicable as the patient is male. RIGHT OVARY: Not applicable as the patient is male. LEFT OVARY: Not applicable as the patient is male. BLADDER: Foley catheter in the bladder. There is a moderate to large  amount of heterogeneous nonvascular echogenic material within the bladder surrounding the foley catheter, possibly related to hemorrhage. FREE FLUID: No free fluid. IMPRESSION: 1. Moderate to large amount of heterogeneous nonvascular echogenic material within the urinary bladder surrounding the Foley catheter, compatible with intravesical blood clot/hemorrhage. Electronically signed by: Greig Pique MD 06/27/2024 04:26 PM EST RP Workstation: [id number]   I have reviewed the pertinent notes,  US  and CT reports and imaging and labs.    Assessment/Plan: Clot retention with recent SP tube placement on Apixaban .   I was unable to irrigate the catheter in the ER and felt it was best to attempt a tube exchange and clot evacuation in the OR under sedation.  I have reviewed the risks with the family including further bleeding, infection, injury to the urinary tract, anesthetic complications and death.  He is acutely ill.  Anticoagulated state.  He is getting Kcentra  to attempt to reverse the apixaban .          No follow-ups on file.    CC: Dorn Dec PA.      Norleen Seltzer 06/27/2024     [1]  Allergies Allergen Reactions   Hydrochlorothiazide Nausea Only and Palpitations   Codeine Nausea And Vomiting   Doxycycline Nausea Only   Metronidazole  Nausea Only   Mysoline [Primidone] Other (See Comments)    Change in  balance   "

## 2024-06-27 NOTE — H&P (Signed)
 " History and Physical    Patient: Michael Odonnell FMW:[id number] DOB: 1940/12/23 DOA: 06/27/2024 DOS: the patient was seen and examined on 06/27/2024 PCP: Nichole Senior, MD  Patient coming from: [name]    Chief Complaint: gross hematuria   HPI: Michael Odonnell is a 84 y.o. male with medical history significant of degenerative lumbar disc disease, HTN, HLD,  PD, GERD, PAF on eliquis , COPD, CKD stage 3a who presented to ED with complaints of gross hematuria. States foley stopped working with clots and blood so he was brought to ED. Family unsure when last eliquis  was taken. They aren't sure if he received this morning at facility. He had no pain until in ED and now complaints of intense suprapubic pain/pressure and urge to urinate. No fever/chills, chest pain or palpitations, shortness of breath or cough, N/V/D.   Admitted 1/8-1/15/26 for gross hematuria. He had an obliterated urethral meatus without true lumen identified. Urology consulted and suprapubic catheter was laced on 1/8. Urology signed off after 1/9 and recommended 4-week exchange to allow for epithelialization as well as long-term suprapubic versus urethral construction at a tertiary center to be discussed on follow-up   He has been admitted several times in the past several months--12/13 to 12/16 with left hip fracture as well as was a fracture of his left upper arm which was non operable readmitted 12/17 through 06/01/2024 with delirium in the setting of missing doses of Sinemet -and was discharged back to [facility]--at that admission Eliquis  was held briefly and then resumed   Denies any fever/chills, vision changes/headaches, chest pain or palpitations, shortness of breath or cough, abdominal pain, N/V/D, leg swelling.    ER Course:  vitals: afebrile, bp: 108/55, HR: 81, RR: 18, oxygen: 98%RA Pertinent labs: wbc: 12.4, hgb: 9.2, lactic acid: 1.4, INR; 1.3,  US  pelvis: Moderate to large amount of heterogeneous nonvascular  echogenic material within the urinary bladder surrounding the Foley catheter, compatible with intravesical blood clot/hemorrhage. In ED: urology consulted and requested eliquis  reversal with ckentra per urology. He had an episode of hypotension and edp called a code sepsis despite no criteria. Cuff on ankle. He was bolused, cultured and started on broad spectrum antibiotics despite normal lactic acid and no SIRS criteria. Urology will likely take to OR and exchange foley. [facility] asked to admit.   Had intermittent episodes of heart rate over 200, asymptomatic with soft blood pressures. With his PD and shaking unsure how accurate. EdP came to assess and we decided to start amio for rate control in setting of labile blood pressures. Cardiology also consulted and moved to stepdown. Agree with amio, but watch closely for any beta blockade.     Review of Systems: As mentioned in the history of present illness. All other systems reviewed and are negative. 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    Tremor    Past Surgical History:  Procedure Laterality Date   CLEFT LIP REPAIR     CLEFT PALATE REPAIR     CYSTOSCOPY N/A 06/18/2024   Procedure: CYSTOSCOPY, PLACEMENT OF SUPRAPUBIC CATHETER, FLEXIBLE CYSTOSCOPY;  Surgeon: Devere Lonni Righter, MD;  Location: [facility];  Service: Urology;  Laterality: N/A;   HIP PINNING,CANNULATED Left  05/23/2024   Procedure: PERCUTANEOUS FIXATION OF FEMURAL NECK;  Surgeon: Georgina Ozell LABOR, MD;  Location: [facility] OR;  Service: Orthopedics;  Laterality: Left;   SKIN GRAFT FULL THICKNESS LEG  12/2018   Social History:  reports that he quit smoking about 11 years ago. His smoking use included cigarettes. He has never used smokeless tobacco. He reports  that he does not drink alcohol and does not use drugs.  Allergies[1]  Family History  Problem Relation Age of Onset   Heart attack Mother 61   Heart disease Sister    Melanoma Sister    Heart attack Brother    Healthy Daughter    Healthy Daughter    Stomach cancer Neg Hx    Colon cancer Neg Hx     Prior to Admission medications  Medication Sig Start Date End Date Taking? Authorizing Provider  acetaminophen  (TYLENOL ) 500 MG tablet Take 1,000 mg by mouth every 6 (six) hours as needed for mild pain or headache.     [provider]  apixaban  (ELIQUIS ) 5 MG TABS tablet TAKE 1 TABLET BY MOUTH TWICE  DAILY Patient taking differently: Take 5 mg by mouth every 12 (twelve) hours. 02/13/24   Pietro Redell RAMAN, MD  bisacodyl  (DULCOLAX) 5 MG EC tablet Take 2 tablets (10 mg total) by mouth daily at 6 (six) AM. 06/23/24   Tobie Yetta HERO, MD  calcium  carbonate (TUMS - DOSED IN MG ELEMENTAL CALCIUM ) 500 MG chewable tablet Chew 2 tablets by mouth daily as needed for indigestion or heartburn.    [provider]  carbidopa -levodopa  (SINEMET  IR) 25-100 MG tablet TAKE 2 TABLETS BY MOUTH AT 6 AM, 1 TABLET AT 10 AM, 1 TABLET AT 2 PM, AND 1 TABLET AT 6 PM Patient taking differently: Take 1-2 tablets by mouth See admin instructions. Take 2 tablets by mouth at 10 AM and 1 tablet at 10 AM, 2 PM, and 6 PM 05/19/24   Tat, Rebecca S, DO  diltiazem  (TIAZAC ) 360 MG 24 hr capsule TAKE 1 CAPSULE BY MOUTH DAILY 05/19/24   Pietro Redell RAMAN, MD  finasteride  (PROSCAR ) 5 MG tablet Take 5 mg by mouth daily.    [provider]  magnesium  oxide (MAG-OX) 400 (240 Mg) MG tablet Take 400 mg by mouth daily in the afternoon.    [provider]  methocarbamol  (ROBAXIN ) 500 MG tablet Take 1 tablet (500 mg total) by mouth every 6 (six) hours as needed for muscle spasms. 05/26/24   Pokhrel, Laxman, MD  mirabegron  ER (MYRBETRIQ ) 50 MG TB24 tablet Take 50 mg by mouth daily at 4 PM.    [provider]  Multiple Vitamins-Minerals (CENTRUM SILVER 50+MEN) TABS Take 1 tablet by mouth at bedtime.    [provider]  niacin  500 MG tablet Take 500 mg by mouth every 12 (twelve) hours.    [provider]  Nutritional Supplements (NUTRITIONAL DRINK) LIQD Take 120 mLs by mouth See admin instructions. Med[employer] - Drink 120 ml's by mouth 2 times a day    [provider]  oxyCODONE  (OXY IR/ROXICODONE ) 5 MG immediate release tablet Take 1 tablet (5 mg total) by mouth every 6 (six) hours as needed for moderate pain (pain score 4-6) or severe pain (pain score 7-10). 06/25/24   Samtani, Jai-Gurmukh, MD  perindopril (ACEON) 8 MG tablet Take 8 mg by mouth in the morning. 12/30/17   [provider]  polyethylene glycol powder (GLYCOLAX /MIRALAX ) 17 GM/SCOOP powder Take 17 g by mouth daily.  Dissolve 1 capful (17g) in 4-8 ounces of liquid and take by mouth daily. 06/23/24   Tobie Yetta HERO, MD  Skin Protectants, Misc. (CARRINGTON MOIST BARRIER/ZINC EX) Apply 1 application  topically See admin instructions. Apply to buttocks after each episode of incontinence- after first cleansing and drying    [provider]  tamsulosin  (FLOMAX ) 0.4 MG CAPS capsule Take 1 capsule (0.4 mg total) by mouth daily. 12/22/16   Nelta Jackye Saha, MD    Physical Exam: Vitals:   06/27/24 1745 06/27/24 1840 06/27/24 2045 06/27/24 2100  BP: (!) 112/93  (!) 126/56 (!) 137/54  Pulse: 86  62 63  Resp: (!) 38  15 15  Temp:  98 F (36.7 C) 98.1 F (36.7 C)   TempSrc:      SpO2: 98%  99% 97%  Weight:      Height:       General:  Appears calm and comfortable and is in NAD. Significant resting tremor  Eyes:  PERRL, EOMI, normal lids, iris ENT:  grossly normal hearing, lips & tongue, mmm; edentulous  Neck:  no LAD, masses or thyromegaly; no carotid bruits Cardiovascular:  rate irregularly irregular, no m/r/g. No LE edema.  Respiratory:   CTA bilaterally with no wheezes/rales/rhonchi.  Normal  respiratory effort. Abdomen:  soft, TTP in suprapubic area, ND, NABS. Suprapubic catheter  Back:   normal alignment, no CVAT Skin:  no rash or induration seen on limited exam Musculoskeletal:  grossly normal tone BUE/BLE, good ROM, no bony abnormality. LUE in sling.  Lower extremity:  No LE edema.  Limited foot exam with no ulcerations.  2+ distal pulses. Psychiatric:  grossly normal mood and affect, speech fluent and appropriate, AOx3 Neurologic:  CN 2-12 grossly intact, moves all extremities in coordinated fashion, sensation intact   Radiological Exams on Admission: Independently reviewed - see discussion in A/P where applicable  US  Pelvis Limited Result Date: 06/27/2024 EXAM: PELVIC ULTRASOUND 06/27/2024 04:03:30 PM TECHNIQUE: Transabdominal pelvic duplex ultrasound using B-mode/gray scaled imaging with Doppler spectral analysis and color flow was obtained. COMPARISON: CT abdomen and pelvis 06/18/2024. CLINICAL HISTORY: assess for urinary retention/clot burden in bladder. FINDINGS: ULTRASOUND FINDINGS: UTERUS: Not applicable as the patient is male. ENDOMETRIAL STRIPE: Not applicable as the patient is male. RIGHT OVARY: Not applicable as the patient is male. LEFT OVARY: Not applicable as the patient is male. BLADDER: Foley catheter in the bladder. There is a moderate to large amount of heterogeneous nonvascular echogenic material within the bladder surrounding the foley catheter, possibly related to hemorrhage. FREE FLUID: No free fluid. IMPRESSION: 1. Moderate to large amount of heterogeneous nonvascular echogenic material within the urinary bladder surrounding the Foley catheter, compatible with intravesical blood clot/hemorrhage. Electronically signed by: Greig Pique MD 06/27/2024 04:26 PM EST RP Workstation: [id number]    EKG: Independently reviewed.  Atrial fib with rate 87; nonspecific ST changes with no evidence of acute ischemia   Labs on Admission: I have personally reviewed the  available labs and imaging studies at the time of the admission.  Pertinent labs:   wbc: 12.4,  hgb: 9.2,  lactic acid: 1.4,  INR; 1.3  Assessment and Plan: Principal Problem:   Bladder hemorrhage Active Problems:   Paroxysmal atrial fibrillation with RVR (HCC)   Leukocytosis   Normocytic anemia   Essential hypertension   BPH (benign prostatic hyperplasia)   Chronic obstructive pulmonary disease (HCC)   Foley catheter in place   Parkinson's disease (HCC)   Proximal humerus fracture  Stage 3a chronic kidney disease (HCC)   Hyperlipidemia    Assessment and Plan: * Bladder hemorrhage 84 year old presenting to ED with complaints of gross hematuria and clots in foley with it not working. Developed significant suprapubic pressure and pain in ED found to have intravesical blood clot/hemorrhage in bladder surrounding foley with blocked catheter -obs to stepdown due to afib with RVR -eliquis  reversed in ED with Kcentra  per urology, continue hold eliquis  -hgb stable, will trend. Type and screen  -urology consulted and urgent taken to OR. Attempted bedside flushing and failed sot taken to OR for attempt a tube exchange and clot evacuation in the OR under sedation  -in OR Dr. Brunetta was able to stop bleeding and placed a 16fr foley transurethrally and will run CBI in through the SP tube and out through the foley. Once it is determined that bleeding is stopped can plug one of the catheters and eventually decide which to leave long term, but he should have both for about a week to allow for urethral healing.  -foley care   Paroxysmal atrial fibrillation with RVR (HCC) While in ED heart rate labile going over 200 then down to 80s.  Blood pressure labile and soft at times Asymptomatic Started amiodarone  for rate control Consulted cardiology, agree with above Watch closely in stepdown to make sure doesn't brady while on amio Check echo Eliquis  reversed due to hemorrhage in bladder  Hold  cardizem  with soft blood pressures, had dose today   Leukocytosis No fever, tachypnea and initially no tachycardia or other SIRS criteria Received cefepime , flagly in ED. I cancelled vanc Lactic acid wnl CXR clear, still waiting on UA Check PCT and f/u on UA   Normocytic anemia Baseline has been trending down since last year. Was around 12 then now hovering around 9-9 Stable today, but with bleeding will continue to trend CBC Type and screen Eliquis  reversed    Essential hypertension Soft and labile blood pressures Hold home meds Start back home meds at pressures allow   BPH (benign prostatic hyperplasia) Now s/p suprapubic catheter Continue flomax  and proscar    Chronic obstructive pulmonary disease (HCC) Stable, no s/sx of exacerbation  On on home inhalers   Foley catheter in place See #1 Foley care   Parkinson's disease [facility]) Continue Parcopa  Fall risk   Proximal humerus fracture Non operative. Seen by ortho NWB of left upper extremity Sling  Pain meds PRN for moderate pain   Stage 3a chronic kidney disease (HCC) At baseline, monitor   Hyperlipidemia Niacin  only        Advance Care Planning:   Code Status: Full Code    Consultants: Dr. Brunetta urology  and cardiology (Dr. Orlando)    Procedures: OR tonight for foley exchange    Antibiotics: Received flagyl  and cefepime  06/27/24.     DVT Prophylaxis: SCDs  Family Communication: daughter and wife at bedside   Severity of Illness: The appropriate patient status for this patient is OBSERVATION. Observation status is judged to be reasonable and necessary in order to provide the required intensity of service to ensure the patient's safety. The patient's presenting symptoms, physical exam findings, and initial radiographic and laboratory data in the context of their medical condition is felt to place them at decreased risk for further clinical deterioration. Furthermore, it is anticipated that the  patient will be medically stable for discharge from the hospital within 2 midnights of admission.   Author: Isaiah Geralds, MD 06/27/2024 9:23 PM  For on  call review [email or web address].      [1]  Allergies Allergen Reactions   Hydrochlorothiazide Nausea Only and Palpitations   Codeine Nausea And Vomiting   Doxycycline Nausea Only   Metronidazole  Nausea Only   Mysoline [Primidone] Other (See Comments)    Change in balance   "

## 2024-06-27 NOTE — Op Note (Signed)
 Procedure: Cystoscopy with evacuation of clots and fulguration of bleeders.  Pre-op diagnosis: Clot retention with history of recent suprapubic tube placement and urethral obliteration.  Postop diagnosis: Clot retention with possible urethra with recent bulbar urethral trauma.  Surgeon: Dr. Norleen Seltzer.  Anesthesia: General.  Specimen: None.  Drains: 22 French council urethral Foley catheter and 16 French suprapubic tube.  EBL: 500 mL of fresh clot.  Complications: None.  Indications: The patient is an 84 year old male who was taken to the operating room on January 8 of this year for attempted Foley placement but because of traumatic catheterization attempts the urethral lumen could not be identified so a 16 French suprapubic tube was placed.  He did well with the procedure but got started back on his apixaban  and after physical therapy today he began to have bleeding with obstruction of the suprapubic tube.  He came to the emergency room where he was found to have a large clot in his bladder and the suprapubic tube could not be irrigated.  It was felt that he needed to go to the operating room for further evaluation and treatment.  Procedure: He had been given antibiotics.  He was taken the operating room where general anesthetic was induced.  He was placed in lithotomy position and fitted with PAS hose.  His lower abdomen including the suprapubic tube at along with perineum and genitalia were prepped with Betadine solution he was draped in usual sterile fashion.  I elected to attempt urethral endoscopy first because he was expressing large clots from the urethra suggesting a patent urethral lumen.  I elected to use the 6.5 French semirigid short ureteroscope which would allow me to get into smaller areas and closer to the stricture.  Using the scope I was able to identify the true urethral lumen and advance the scope into the bladder where I passed a sensor wire.  I then passed the  cystoscope alongside the wire and inspection of the urethra revealed mucosal trauma in the posterior bulb and a flap of tissue near the membranous sphincter.  The prostatic urethra was short but there was a high bladder neck which was somewhat stiff.  I was able to get the scope into the bladder where there was a large amount of fresh clot that I was able to evacuate with a [place] syringe.  Once all of the clot was removed I inspected the suprapubic tube insertion site and saw no active bleeding from this area.  I was unable to clearly identify the source of the bleeding which appeared to still be present so I removed the cystoscope.  The urethra was then calibrated to 30 French with [employer] and a 26 French continuous-flow resectoscope sheath was placed with the aid of the visual obturator into the bladder.  The visual obturator was replaced with the [facility] handle with the bipolar loop and 30 degree lens with saline as the irrigant.  Eventually the only area of bleeding that identified was the left anterior prostatic urethra and I fulgurated this area generously.  A small residual clot was evacuated and final inspection revealed no significant active bleeding and no retained clots.  There was no evidence of bladder wall injury.  Ureteral orifices were unremarkable.  No bladder wall lesions were identified.  The resectoscope was removed and a 22 French council catheter was placed over the wire into the bladder without difficulty.  The balloon was filled with 20 mL of sterile fluid and the wire was removed.  The catheter was held on traction and irrigated with clear return.  I then irrigated through the suprapubic tube with an Asepto syringe and there was good through and through flow.  We connected the TUR tubing to the suprapubic tube and placed the council catheter to a drainage bag  Rubber bands and a safety pin were then used to place the urethral catheter on traction to help tamponade any  prostatic bleeding.  The irrigation remained quite clear.  He was taken down from lithotomy position, his anesthetic was reversed and he was moved to the recovery room in stable condition.  There were no complications.

## 2024-06-27 NOTE — Assessment & Plan Note (Signed)
 Baseline has been trending down since last year. Was around 12 then now hovering around 9-9 Stable today, but with bleeding will continue to trend CBC Type and screen Eliquis  reversed

## 2024-06-27 NOTE — Assessment & Plan Note (Addendum)
 Non operative. Seen by ortho NWB of left upper extremity Sling  Pain meds PRN for moderate pain

## 2024-06-27 NOTE — Assessment & Plan Note (Signed)
 Soft and labile blood pressures Hold home meds Start back home meds at pressures allow

## 2024-06-27 NOTE — Assessment & Plan Note (Signed)
 84 year old presenting to ED with complaints of gross hematuria and clots in foley with it not working. Developed significant suprapubic pressure and pain in ED found to have intravesical blood clot/hemorrhage in bladder surrounding foley with blocked catheter -obs to stepdown due to afib with RVR -eliquis  reversed in ED with Kcentra  per urology, continue hold eliquis  -hgb stable, will trend. Type and screen  -urology consulted and urgent taken to OR. Attempted bedside flushing and failed sot taken to OR for attempt a tube exchange and clot evacuation in the OR under sedation  -in OR Dr. Brunetta was able to stop bleeding and placed a 54fr foley transurethrally and will run CBI in through the SP tube and out through the foley. Once it is determined that bleeding is stopped can plug one of the catheters and eventually decide which to leave long term, but he should have both for about a week to allow for urethral healing.  -foley care

## 2024-06-28 ENCOUNTER — Encounter (HOSPITAL_COMMUNITY): Payer: Self-pay | Admitting: Family Medicine

## 2024-06-28 DIAGNOSIS — N3289 Other specified disorders of bladder: Secondary | ICD-10-CM | POA: Diagnosis not present

## 2024-06-28 DIAGNOSIS — I480 Paroxysmal atrial fibrillation: Secondary | ICD-10-CM | POA: Diagnosis not present

## 2024-06-28 LAB — URINALYSIS, ROUTINE W REFLEX MICROSCOPIC
Bilirubin Urine: NEGATIVE
Glucose, UA: NEGATIVE mg/dL
Ketones, ur: NEGATIVE mg/dL
Nitrite: NEGATIVE
Protein, ur: NEGATIVE mg/dL
Specific Gravity, Urine: 1.006 (ref 1.005–1.030)
pH: 6 (ref 5.0–8.0)

## 2024-06-28 LAB — PROTIME-INR
INR: 1.2 (ref 0.8–1.2)
Prothrombin Time: 15.8 s — ABNORMAL HIGH (ref 11.4–15.2)

## 2024-06-28 LAB — COMPREHENSIVE METABOLIC PANEL WITH GFR
ALT: <5 U/L (ref 0–44)
AST: 15 U/L (ref 15–41)
Albumin: 3.1 g/dL — ABNORMAL LOW (ref 3.5–5.0)
Alkaline Phosphatase: 126 U/L (ref 38–126)
Anion gap: 8 (ref 5–15)
BUN: 16 mg/dL (ref 8–23)
CO2: 24 mmol/L (ref 22–32)
Calcium: 8.4 mg/dL — ABNORMAL LOW (ref 8.9–10.3)
Chloride: 106 mmol/L (ref 98–111)
Creatinine, Ser: 0.82 mg/dL (ref 0.61–1.24)
GFR, Estimated: >60 mL/min
Glucose, Bld: 197 mg/dL — ABNORMAL HIGH (ref 70–99)
Potassium: 4.3 mmol/L (ref 3.5–5.1)
Sodium: 138 mmol/L (ref 135–145)
Total Bilirubin: 0.2 mg/dL (ref 0.0–1.2)
Total Protein: 5.4 g/dL — ABNORMAL LOW (ref 6.5–8.1)

## 2024-06-28 LAB — CBC
HCT: 23.7 % — ABNORMAL LOW (ref 39.0–52.0)
Hemoglobin: 7.5 g/dL — ABNORMAL LOW (ref 13.0–17.0)
MCH: 29.6 pg (ref 26.0–34.0)
MCHC: 31.6 g/dL (ref 30.0–36.0)
MCV: 93.7 fL (ref 80.0–100.0)
Platelets: 272 K/uL (ref 150–400)
RBC: 2.53 MIL/uL — ABNORMAL LOW (ref 4.22–5.81)
RDW: 14.9 % (ref 11.5–15.5)
WBC: 13 K/uL — ABNORMAL HIGH (ref 4.0–10.5)
nRBC: 0 % (ref 0.0–0.2)

## 2024-06-28 LAB — MRSA NEXT GEN BY PCR, NASAL: MRSA by PCR Next Gen: DETECTED — AB

## 2024-06-28 LAB — PROCALCITONIN: Procalcitonin: 0.34 ng/mL

## 2024-06-28 MED ORDER — FLEET ENEMA RE ENEM
1.0000 | ENEMA | Freq: Once | RECTAL | Status: AC
  Administered 2024-06-28: 1 via RECTAL
  Filled 2024-06-28: qty 1

## 2024-06-28 MED ORDER — DILTIAZEM HCL ER COATED BEADS 180 MG PO CP24
180.0000 mg | ORAL_CAPSULE | Freq: Every day | ORAL | Status: DC
  Administered 2024-06-28: 180 mg via ORAL
  Filled 2024-06-28 (×2): qty 1

## 2024-06-28 NOTE — Progress Notes (Signed)
 [facility]  Michael Odonnell FMW:[id number]  DOB: December 12, 1940  DOA: 06/27/2024  PCP: Nichole Senior, MD  06/28/2024,11:50 AM  LOS: 0 days    Code Status:  Full code     From:  [name]  84 yr male Known history of chronic A-fib CHADVASC >4 on Eliquis  HTN Parkinson's disease ,?prior delirium  Admit 12/13--12/22 Hip # Dr. Georgina and repair by him-WBAT Left lower extremity--NWB left upper extremity and keep in sling readmitted 12/17 through 06/01/2024 with delirium in the setting of missing doses of Sinemet -and was discharged back to [facility]--at that admission Eliquis  was held briefly and then resumed   1/8-1/15 readmit [facility] gross hematuria-he had decreased urine output and at the facility they tried to place Foley and then they found blood as well as clots and bloody output at the meatus--- there was found to be an obliterated urethral meatus without true lumen identified---1 urology place suprapubic 1/8 16 French and signed off 1/9 recommending 4-week exchange to allow for epithelialization and?  Ureteric construction  1/17 came back to ED with bright red bleeding around suprapubic in the setting of recent resumption of apixaban  and today after PT-Foley was not draining in the ER and Dr. Watt urology was consulted became tachycardic 180s hypotensive hemoglobin dropped from around 9--->7.5-treated with Kcentra  Found to also been sinus tach with A-fib RVR on arrival and started amiodarone  gtt. and cardiology consulted also Bedside attempt at Foley flushing was performed but this failed went to the OR for tube exchange and clot evacuation under sedation -Went for emergent surgery after the Kcentra -and clot was removed and clot retention from probable urethral trauma was managed-underwent placement of a 22 French council catheter over wire into the bladder and balloon was filled with 20 mL-suprapubic tube was then also irrigated   Assessment  & Plan :    Urethral trauma with clot  retention--- recent urethral trauma last admission with suprapubic catheter placement and retention  CBI has been discontinued-SP tube has been plugged and urology continuing Foley catheter drainage and urethra for 2 weeks for urethral healing with plans as an outpatient to probably remove it and use suprapubic tube Currently has urethral catheter 22 French placed--has suprapubic 16 French in place as well ---CBT not running--- resume Flomax  0.4 Myrbetriq  50 finasteride  5 From my end does not need antibiotics as this seems mainly to be hemorrhage-blood cultures were obtained which are showing no growth Pain control Tylenol  650 every 6 as needed, oxycodone  IR 5 every 4 as needed moderate pain--can use BNO suppository every 6 as needed spasm, can use methocarbamol  if severe muscle spasm but of held for now  SVT on admission?  Rapid A-fib in the setting of known chronic A-fib Cardiology consulted-feel that tachycardia may have been regular at beginning but and amiodarone  now discontinued As hypotension is resolved will place back on Cardizem  XR but at lower dose of 180 CD and holding Tiazac  360 Would not use Eliquis  5 twice daily for at least 1 week or so given several recent episodes of bleeding from bladder Holding lisinopril  20 at this time  Anemia of acute blood loss He is very equilibrating with IV fluids and blood loss anemia from Foley trauma If he drops below 7 hemoglobin I would transfuse 1 unit--- check iron  stores at next lab draw  Parkinson's disease Continue Sinemet  2 tabs daily and 1 tab 3 times daily as per orders  Previous fecal impaction CT suggestive of stercoral colitis in addition to  impaction so we will give enema and start MiraLAX  twice daily 17 mg  Recent fall with left hip fracture weightbearing as tolerated Nonweightbearing left upper extremity-proximal humerus fracture CT chest is suggestive of fluid collection 6 x 3 x 3 cm around proximal femoral diaphysis  intramuscularly-do not think this is infection however and may just be postoperative edema Pain control Tylenol  650 can use Oxy IR as above-both PT OT requested to see in consult on patient and likely can return to skilled facility Outpatient further follow-up as per Dr. Carloyn him-seems to have 07/20/2024 appointment which she can keep  Data Reviewed today: Sodium 138 potassium 4.3 BUN/creatinine 16/0.8 albumin  3.1 Procalcitonin 0.3 WBC 13 hemoglobin 7.5 platelet 272 UA large hemoglobin moderate leukocytes  DVT prophylaxis: SCD at this time  Dispo/Global plan: Await resolution of issues and likely can discharge to rehab and resume Eliquis  and a week or so-I have updated his wife [name] [phone] at the bedside    Subjective:   Coherent awake alert no distress looks fair feels well When I took him off oxygen his sats were in the 90s He has no abdominal pain no chills had some strangury/bladder spasm which is now improved  Objective + exam Vitals:   06/28/24 0700 06/28/24 0800 06/28/24 0900 06/28/24 1000  BP: 110/71 (!) 127/46 138/69   Pulse: (!) 54 (!) 55 75 (!) 58  Resp: (!) 9 11 (!) 22 15  Temp:      TempSrc:      SpO2: 97% 98% 100% 100%  Weight:      Height:       Filed Weights   06/27/24 1441 06/27/24 2145  Weight: 74 kg 68.7 kg   Examination: EOMI NCAT no focal deficit no icterus no pallor no wheeze no rales no rhonchi-S1-S2 seems to be in sinus rhythm with some episodic tremors I do not think telemetry reflects any arrhythmia--chest is clear posterolaterally--suprapubic in place reviewed Foley catheter in urethra area with some blood around the meatus but Foley draining clear urine as it is suprapubic No lower extremity edema Power 5/5 Psych is euthymic  Scheduled Meds:  bisacodyl   10 mg Oral Q0600   carbidopa -levodopa   2 tablet Oral Q0600   carbidopa -levodopa   1 tablet Oral TID   Chlorhexidine  Gluconate Cloth  6 each Topical Daily   finasteride   5 mg Oral Daily    magnesium  oxide  400 mg Oral QPM   mirabegron  ER  50 mg Oral q1600   polyethylene glycol  17 g Oral Daily   saccharomyces boulardii  250 mg Oral BID   tamsulosin   0.4 mg Oral QHS   Continuous Infusions:  amiodarone  Stopped (06/28/24 0523)   lactated ringers  75 mL/hr at 06/28/24 0820   sodium chloride  irrigation     acetaminophen  **OR** acetaminophen , opium -belladonna, mouth rinse, oxyCODONE   I spent 85 minutes today before, during and after this patient interview and examination--reviewing pertinent data, coordinating the patient's care and in communication with the care team and other medical professionals  Colen Grimes, MD  [facility]

## 2024-06-28 NOTE — Progress Notes (Signed)
 "   Cardiology Consultation:   Patient ID: Michael Odonnell; [id number]; [birth year]   Admit date: 06/27/2024 Date of Consult: 06/28/2024  Primary Care Provider: Nichole Senior, MD Primary Cardiologist: Redell Shallow, MD  Primary Electrophysiologist:  None   Patient Profile:   Michael Odonnell is a 84 y.o. male with a hx of PAF who is being seen today for the evaluation of tachycardia at the request of Dr. Waddell.  History of Present Illness:   Mr. Ingrum has a history of paroxysmal atrial fibrillation chronically on Eliquis .  He has hematuria being treated by Dr. Watt.  He had a traumatic attempted Foley catheterization and his suprapubic tube placed earlier this month.  Initially had Eliquis  held but then had restarting of this.  However, his Foley stopped functioning had clots.  He was brought back to the hospital.  Yesterday had cystoscopy with evacuation of clots and fulguration of bleeders.  In the emergency room ventricular rates were 200.  Amiodarone  was started.  Calcium  channel blockers were held secondary to borderline blood pressures.  He was given prothrombin  complex.  The patient was last seen by Dr. Shallow in March 2025.  He is followed for his atrial fibrillation.  This has been paroxysmal.  The patient previously had discussed Watchman device but had wanted more conservative therapy.  The patient reports that he rarely feels palpitations.  He has not had any presyncope or syncope.  He is at a nursing home and starting some physical therapy but he says he is mostly in the bed.  He has not had chest pressure, neck or arm discomfort.  Describes no new shortness of breath, PND or orthopnea.  He had no weight gain or edema.  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    Tremor     Past Surgical History:  Procedure Laterality Date   CLEFT LIP REPAIR     CLEFT PALATE REPAIR     CYSTOSCOPY N/A 06/18/2024   Procedure: CYSTOSCOPY, PLACEMENT OF SUPRAPUBIC CATHETER, FLEXIBLE CYSTOSCOPY;  Surgeon: Devere Lonni Righter, MD;  Location: [facility];  Service: Urology;  Laterality: N/A;   HIP PINNING,CANNULATED Left 05/23/2024   Procedure: PERCUTANEOUS FIXATION OF FEMURAL NECK;  Surgeon: Georgina Ozell LABOR, MD;  Location: [facility] OR;  Service: Orthopedics;  Laterality: Left;   SKIN GRAFT FULL THICKNESS LEG  12/2018     Home Medications:  Prior to Admission medications  Medication Sig Start Date End Date Taking? Authorizing Provider  acetaminophen  (TYLENOL ) 500 MG tablet Take 1,000 mg by mouth every 6 (six) hours as needed for mild pain or headache.    Yes [provider]  apixaban  (ELIQUIS ) 5 MG TABS tablet TAKE 1 TABLET BY MOUTH TWICE  DAILY Patient taking differently: Take 5 mg by mouth every 12 (twelve) hours. 02/13/24  Yes Shallow Redell RAMAN, MD  bisacodyl  (DULCOLAX) 5 MG EC tablet Take 2 tablets (10 mg total) by mouth daily at 6 (six) AM. 06/23/24  Yes Tobie Yetta HERO, MD  calcium  carbonate (TUMS - DOSED IN MG ELEMENTAL CALCIUM ) 500 MG chewable tablet Chew 2 tablets by mouth daily as needed for indigestion or heartburn.   Yes [provider]  carbidopa -levodopa  (PARCOPA ) 25-100 MG  disintegrating tablet Take 2 tablets by mouth every morning.   Yes [provider]  carbidopa -levodopa  (PARCOPA ) 25-250 MG disintegrating tablet Take 1 tablet by mouth 3 (three) times daily.   Yes [provider]  diltiazem  (TIAZAC ) 360 MG 24 hr capsule TAKE 1 CAPSULE BY MOUTH DAILY 05/19/24  Yes Crenshaw, Redell RAMAN, MD  finasteride  (PROSCAR ) 5 MG tablet Take 5 mg by mouth daily.   Yes [provider]  lisinopril  (ZESTRIL ) 20 MG tablet Take 20 mg by mouth daily.   Yes [provider]  magnesium  oxide (MAG-OX) 400 (240 Mg) MG tablet  Take 400 mg by mouth daily in the afternoon.   Yes [provider]  methocarbamol  (ROBAXIN ) 500 MG tablet Take 1 tablet (500 mg total) by mouth every 6 (six) hours as needed for muscle spasms. 05/26/24  Yes Pokhrel, Laxman, MD  mirabegron  ER (MYRBETRIQ ) 50 MG TB24 tablet Take 50 mg by mouth daily at 4 PM.   Yes [provider]  Multiple Vitamins-Minerals (CENTRUM SILVER 50+MEN) TABS Take 1 tablet by mouth at bedtime.   Yes [provider]  niacin  500 MG tablet Take 500 mg by mouth every 12 (twelve) hours.   Yes [provider]  oxyCODONE  (OXY IR/ROXICODONE ) 5 MG immediate release tablet Take 1 tablet (5 mg total) by mouth every 6 (six) hours as needed for moderate pain (pain score 4-6) or severe pain (pain score 7-10). Patient taking differently: Take 5 mg by mouth every 4 (four) hours as needed for moderate pain (pain score 4-6) or severe pain (pain score 7-10). 06/25/24  Yes Samtani, Jai-Gurmukh, MD  polyethylene glycol powder (GLYCOLAX /MIRALAX ) 17 GM/SCOOP powder Take 17 g by mouth daily. Dissolve 1 capful (17g) in 4-8 ounces of liquid and take by mouth daily. 06/23/24  Yes Tobie Yetta HERO, MD  saccharomyces boulardii (FLORASTOR) 250 MG capsule Take 250 mg by mouth 2 (two) times daily.   Yes [provider]  Skin Protectants, Misc. (CARRINGTON MOIST BARRIER/ZINC EX) Apply 1 application  topically See admin instructions. Apply to buttocks after each episode of incontinence- after first cleansing and drying   Yes [provider]  tamsulosin  (FLOMAX ) 0.4 MG CAPS capsule Take 1 capsule (0.4 mg total) by mouth daily. 12/22/16  Yes Mackuen, Courteney Lyn, MD  Nutritional Supplements (NUTRITIONAL DRINK) LIQD Take 120 mLs by mouth See admin instructions. MedPass - Drink 120 ml's by mouth 2 times a day    [provider]    Inpatient Medications: Scheduled Meds:  bisacodyl   10 mg Oral Q0600   carbidopa -levodopa   2 tablet Oral Q0600    carbidopa -levodopa   1 tablet Oral TID   Chlorhexidine  Gluconate Cloth  6 each Topical Daily   finasteride   5 mg Oral Daily   magnesium  oxide  400 mg Oral QPM   mirabegron  ER  50 mg Oral q1600   polyethylene glycol  17 g Oral Daily   saccharomyces boulardii  250 mg Oral BID   tamsulosin   0.4 mg Oral QHS   Continuous Infusions:  amiodarone  Stopped (06/28/24 0523)   lactated ringers  75 mL/hr at 06/28/24 0537   sodium chloride  irrigation     PRN Meds: acetaminophen  **OR** acetaminophen , opium -belladonna, mouth rinse, oxyCODONE   Allergies:   Allergies[1]  Social History:   Social History   Socioeconomic History   Marital status: Married    Spouse name: Not on file   Number of children: 2   Years of education: Not on file   Highest education  level: Bachelor's degree (e.g., BA, AB, BS)  Occupational History   Occupation: retired    Comment: aeronautical engineer - [employer]   Tobacco Use   Smoking status: Former    Current packs/day: 0.00    Types: Cigarettes    Quit date: 12/20/2012    Years since quitting: 11.5   Smokeless tobacco: Never  Vaping Use   Vaping status: Never Used  Substance and Sexual Activity   Alcohol use: No   Drug use: No   Sexual activity: Not Currently  Other Topics Concern   Not on file  Social History Narrative   Left hand   Leaving w/ wife, 1 story 2step.   Social Drivers of Health   Tobacco Use: Medium Risk (06/27/2024)   Patient History    Smoking Tobacco Use: Former    Smokeless Tobacco Use: Never    Passive Exposure: Not on Actuary Strain: Not on file  Food Insecurity: No Food Insecurity (06/27/2024)   Epic    Worried About Programme Researcher, Broadcasting/film/video in the Last Year: Never true    Ran Out of Food in the Last Year: Never true  Transportation Needs: No Transportation Needs (06/27/2024)   Epic    Lack of Transportation (Medical): No    Lack of Transportation (Non-Medical): No  Physical Activity: Not on file   Stress: Not on file  Social Connections: Patient Unable To Answer (06/27/2024)   Social Connection and Isolation Panel    Frequency of Communication with Friends and Family: Patient unable to answer    Frequency of Social Gatherings with Friends and Family: Patient unable to answer    Attends Religious Services: Patient unable to answer    Active Member of Clubs or Organizations: Patient unable to answer    Attends Banker Meetings: Patient unable to answer    Marital Status: Patient unable to answer  Recent Concern: Social Connections - Moderately Isolated (05/23/2024)   Social Connection and Isolation Panel    Frequency of Communication with Friends and Family: More than three times a week    Frequency of Social Gatherings with Friends and Family: More than three times a week    Attends Religious Services: Never    Database Administrator or Organizations: No    Attends Banker Meetings: Never    Marital Status: Married  Catering Manager Violence: Not At Risk (06/27/2024)   Epic    Fear of Current or Ex-Partner: No    Emotionally Abused: No    Physically Abused: No    Sexually Abused: No  Depression (PHQ2-9): Not on file  Alcohol Screen: Not on file  Housing: Low Risk (06/27/2024)   Epic    Unable to Pay for Housing in the Last Year: No    Number of Times Moved in the Last Year: 1    Homeless in the Last Year: No  Utilities: Not At Risk (06/27/2024)   Epic    Threatened with loss of utilities: No  Health Literacy: Not on file    Family History:    Family History  Problem Relation Age of Onset   Heart attack Mother 56   Heart disease Sister    Melanoma Sister    Heart attack Brother    Healthy Daughter    Healthy Daughter    Stomach cancer Neg Hx    Colon cancer Neg Hx      ROS:  Please see the history of present  illness.  As stated in the HPI and negative for all other systems.  Physical Exam/Data:   Vitals:   06/28/24 0400 06/28/24  0500 06/28/24 0600 06/28/24 0635  BP: (!) 138/55 (!) 137/48 (!) 136/51   Pulse: 62 (!) 57 (!) 56   Resp: 12 12 11    Temp:    98 F (36.7 C)  TempSrc:    Axillary  SpO2: 98% 98% 96%   Weight:      Height:        Intake/Output Summary (Last 24 hours) at 06/28/2024 0749 Last data filed at 06/28/2024 0537 Gross per 24 hour  Intake 3959.23 ml  Output 3225 ml  Net 734.23 ml   Filed Weights   06/27/24 1441 06/27/24 2145  Weight: 74 kg 68.7 kg   Body mass index is 22.37 kg/m.  GENERAL: Chronically ill appearing HEENT:   Pupils equal round and reactive, fundi not visualized, oral mucosa unremarkable NECK:  No  jugular venous distention, waveform within normal limits, carotid upstroke brisk and symmetric, no bruits, no thyromegaly LYMPHATICS:  No cervical, inguinal adenopathy LUNGS:   Clear to auscultation bilaterally BACK:  No CVA tenderness CHEST:   Unremarkable HEART:  PMI not displaced or sustained,S1 and S2 within normal limits, no S3, no S4, no clicks, no rubs, no murmurs ABD:  Flat, positive bowel sounds normal in frequency in pitch, no bruits, no rebound, no guarding, no midline pulsatile mass, no hepatomegaly, no splenomegaly EXT:  2 plus pulses throughout, no  edema, no cyanosis no clubbing SKIN:  No rashes no nodules NEURO:   Cranial nerves II through XII grossly intact, motor grossly intact throughout PSYCH:    Cognitively intact, oriented to person place and time   EKG:  The EKG was personally reviewed and demonstrates: Sinus rhythm rate 87, left axis deviation, left anterior fascicular block, no acute ST-T wave changes  Telemetry:  Telemetry was personally reviewed and demonstrates: Sinus rhythm  Relevant CV Studies: None  Laboratory Data:  Chemistry Recent Labs  Lab 06/22/24 0320 06/27/24 1600 06/27/24 1616 06/28/24 0307  NA 138 139 138 138  K 3.8 4.2 4.0 4.3  CL 102 104 101 106  CO2 26 25  --  24  GLUCOSE 117* 135* 125* 197*  BUN 12 18 19 16    CREATININE 0.80 1.04 1.20 0.82  CALCIUM  9.2 9.3  --  8.4*  GFRNONAA >60 >60  --  >60  ANIONGAP 10 10  --  8    Recent Labs  Lab 06/27/24 1600 06/28/24 0307  PROT 6.3* 5.4*  ALBUMIN  3.6 3.1*  AST 19 15  ALT <5 <5  ALKPHOS 154* 126  BILITOT 0.5 0.2   Hematology Recent Labs  Lab 06/27/24 1600 06/27/24 1616 06/27/24 2217 06/28/24 0307  WBC 12.4*  --  17.8* 13.0*  RBC 3.14*  --  2.61* 2.53*  HGB 9.2* 8.8* 7.8* 7.5*  HCT 29.7* 26.0* 24.9* 23.7*  MCV 94.6  --  95.4 93.7  MCH 29.3  --  29.9 29.6  MCHC 31.0  --  31.3 31.6  RDW 15.1  --  14.9 14.9  PLT 357  --  275 272   Cardiac EnzymesNo results for input(s): TROPONINI in the last 168 hours. No results for input(s): TROPIPOC in the last 168 hours.  BNPNo results for input(s): BNP, PROBNP in the last 168 hours.  DDimer No results for input(s): DDIMER in the last 168 hours.  Radiology/Studies:  US  Pelvis Limited Result Date: 06/27/2024 EXAM:  PELVIC ULTRASOUND 06/27/2024 04:03:30 PM TECHNIQUE: Transabdominal pelvic duplex ultrasound using B-mode/gray scaled imaging with Doppler spectral analysis and color flow was obtained. COMPARISON: CT abdomen and pelvis 06/18/2024. CLINICAL HISTORY: assess for urinary retention/clot burden in bladder. FINDINGS: ULTRASOUND FINDINGS: UTERUS: Not applicable as the patient is male. ENDOMETRIAL STRIPE: Not applicable as the patient is male. RIGHT OVARY: Not applicable as the patient is male. LEFT OVARY: Not applicable as the patient is male. BLADDER: Foley catheter in the bladder. There is a moderate to large amount of heterogeneous nonvascular echogenic material within the bladder surrounding the foley catheter, possibly related to hemorrhage. FREE FLUID: No free fluid. IMPRESSION: 1. Moderate to large amount of heterogeneous nonvascular echogenic material within the urinary bladder surrounding the Foley catheter, compatible with intravesical blood clot/hemorrhage. Electronically signed by: Greig Pique MD 06/27/2024 04:26 PM EST RP Workstation: [id number]    Assessment and Plan:    Paroxysmal atrial fibrillation: Certainly Eliquis  on hold until cleared by urology.  He would be a marginal candidate for Watchman given his age and frailty.  Previously he would want more conservative therapy  would this have been discussed.  Blood pressure was a little low on admission so holding his calcium  channel blocker.  If he is stable or starting to go up I would start back probably at CD Cardizem  120 mg tomorrow.  Of note I looked through all of the rhythm strips and EKGs available.  There does appear to be some atrial fibrillation but with a controlled ventricular rate.  He is in sinus now and has been on EKGs.  Rapid ventricular rates were just artifact probably related to tremor.  There were no documented elevated rates that I could find.  No indication for amiodarone  which has been discontinued.  I did speak with his wife.     For questions or updates, please contact [facility] Please consult [email or web address] for contact info under Cardiology/STEMI.   Signed, Lynwood Schilling, MD  06/28/2024 7:49 AM      [1]  Allergies Allergen Reactions   Hydrochlorothiazide Nausea Only and Palpitations   Codeine Nausea And Vomiting   Doxycycline Nausea Only   Metronidazole  Nausea Only   Mysoline [Primidone] Other (See Comments)    Change in balance   "

## 2024-06-28 NOTE — Plan of Care (Signed)
" °  Problem: Clinical Measurements: Goal: Ability to maintain clinical measurements within normal limits will improve Outcome: Progressing Goal: Will remain free from infection Outcome: Progressing Goal: Respiratory complications will improve Outcome: Progressing Goal: Cardiovascular complication will be avoided Outcome: Progressing   Problem: Elimination: Goal: Will not experience complications related to urinary retention Outcome: Progressing   Problem: Pain Managment: Goal: General experience of comfort will improve and/or be controlled Outcome: Progressing   Problem: Neurological: Goal: Will regain or maintain usual level of consciousness Outcome: Progressing   Problem: Clinical Measurements: Goal: Ability to maintain clinical measurements within normal limits Outcome: Progressing   "

## 2024-06-28 NOTE — Care Management Obs Status (Signed)
 MEDICARE OBSERVATION STATUS NOTIFICATION   Patient Details  Name: Jakota Manthei MRN: [id number] Date of Birth: [birth year]   Medicare Observation Status Notification Given:  Yes    Sonda Manuella Quill, RN 06/28/2024, 5:17 PM

## 2024-06-28 NOTE — Progress Notes (Signed)
 1 Day Post-Op  Subjective: He is doing well post op with clear urine off of irrigation.  He was found to have some prostatic bleeding and evidence of urethral trauma at cystoscopy.   Hgb is down to 7.5.  ROS:  Review of Systems  All other systems reviewed and are negative.   Anti-infectives: Anti-infectives (From admission, onward)    Start     Dose/Rate Route Frequency Ordered Stop   06/27/24 1600  ceFEPIme  (MAXIPIME ) 2 g in sodium chloride  0.9 % 100 mL IVPB        2 g 200 mL/hr over 30 Minutes Intravenous  Once 06/27/24 1558 06/27/24 1629   06/27/24 1600  metroNIDAZOLE  (FLAGYL ) IVPB 500 mg        500 mg 100 mL/hr over 60 Minutes Intravenous  Once 06/27/24 1558 06/27/24 1724   06/27/24 1600  vancomycin  (VANCOCIN ) IVPB 1000 mg/200 mL premix  Status:  Discontinued        1,000 mg 200 mL/hr over 60 Minutes Intravenous  Once 06/27/24 1558 06/27/24 1559   06/27/24 1600  vancomycin  (VANCOREADY) IVPB 1500 mg/300 mL  Status:  Discontinued        1,500 mg 150 mL/hr over 120 Minutes Intravenous  Once 06/27/24 1559 06/27/24 1738       Current Facility-Administered Medications  Medication Dose Route Frequency Provider Last Rate Last Admin   acetaminophen  (TYLENOL ) tablet 650 mg  650 mg Oral Q6H PRN Waddell Rake, MD       Or   acetaminophen  (TYLENOL ) suppository 650 mg  650 mg Rectal Q6H PRN Waddell Rake, MD       amiodarone  (NEXTERONE  PREMIX) 360-4.14 MG/200ML-% (1.8 mg/mL) IV infusion  30 mg/hr Intravenous Continuous Rogelia Jerilynn RAMAN, MD   Stopped at 06/28/24 0523   belladonna-opium  (B&O) suppository 16.2-60mg   1 suppository Rectal Q6H PRN Alto Rake CROME, NP   1 suppository at 06/28/24 0100   bisacodyl  (DULCOLAX) EC tablet 10 mg  10 mg Oral Q0600 Waddell Rake, MD   10 mg at 06/28/24 9073   carbidopa -levodopa  (SINEMET  IR) 25-100 MG per tablet immediate release 2 tablet  2 tablet Oral Q0600 Waddell Rake, MD       carbidopa -levodopa  (SINEMET  IR) 25-250 MG per tablet immediate  release 1 tablet  1 tablet Oral TID Waddell Rake, MD   1 tablet at 06/28/24 9075   Chlorhexidine  Gluconate Cloth 2 % PADS 6 each  6 each Topical Daily Waddell Rake, MD   6 each at 06/27/24 2159   finasteride  (PROSCAR ) tablet 5 mg  5 mg Oral Daily Waddell Rake, MD   5 mg at 06/28/24 9075   lactated ringers  infusion   Intravenous Continuous Waddell Rake, MD 75 mL/hr at 06/28/24 0820 Infusion Verify at 06/28/24 0820   magnesium  oxide (MAG-OX) tablet 400 mg  400 mg Oral QPM Waddell Rake, MD   400 mg at 06/27/24 2222   mirabegron  ER (MYRBETRIQ ) tablet 50 mg  50 mg Oral q1600 Waddell Rake, MD   50 mg at 06/27/24 2222   Oral care mouth rinse  15 mL Mouth Rinse PRN Waddell Rake, MD       oxyCODONE  (Oxy IR/ROXICODONE ) immediate release tablet 5 mg  5 mg Oral Q4H PRN Waddell Rake, MD   5 mg at 06/27/24 2222   polyethylene glycol (MIRALAX  / GLYCOLAX ) packet 17 g  17 g Oral Daily Waddell Rake, MD   17 g at 06/28/24 9075   saccharomyces boulardii (FLORASTOR) capsule 250 mg  250 mg  Oral BID Waddell Rake, MD   250 mg at 06/28/24 9075   sodium chloride  irrigation 0.9 % 3,000 mL  3,000 mL Irrigation Continuous Rosie Golson, MD   Restarted at 06/28/24 9179   tamsulosin  (FLOMAX ) capsule 0.4 mg  0.4 mg Oral QHS Waddell Rake, MD   0.4 mg at 06/27/24 2222   Facility-Administered Medications Ordered in Other Encounters  Medication Dose Route Frequency Provider Last Rate Last Admin   dexamethasone  (DECADRON ) injection   Intravenous Anesthesia Intra-op Privette, Kristale, CRNA   5 mg at 06/27/24 1951   fentaNYL  (SUBLIMAZE ) injection   Intravenous Anesthesia Intra-op Privette, Kristale, CRNA   25 mcg at 06/27/24 2040   lactated ringers  infusion   Intravenous Continuous PRN Privette, Kristale, CRNA   Stopped at 06/27/24 2140   lidocaine  (cardiac) 100 mg/47mL (XYLOCAINE ) injection 2%   Intravenous Anesthesia Intra-op Privette, Kristale, CRNA   60 mg at 06/27/24 1937   ondansetron  (ZOFRAN ) injection    Intravenous Anesthesia Intra-op Privette, Kristale, CRNA   4 mg at 06/27/24 1951   phenylephrine  (NEO-SYNEPHRINE) injection   Intravenous Anesthesia Intra-op Privette, Kristale, CRNA   320 mcg at 06/27/24 1939   propofol  (DIPRIVAN ) 10 mg/mL bolus/IV push   Intravenous Anesthesia Intra-op Privette, Kristale, CRNA   80 mg at 06/27/24 1937   succinylcholine  (ANECTINE ) syringe   Intravenous Anesthesia Intra-op Privette, Kristale, CRNA   100 mg at 06/27/24 1937   vasopressin  (PITRESSIN) 20 UNIT/ML injection   Intravenous Anesthesia Intra-op Privette, Kristale, CRNA   1 Units at 06/27/24 1955     Objective: Vital signs in last 24 hours: Temp:  [97.5 F (36.4 C)-98.2 F (36.8 C)] 98 F (36.7 C) (01/18 0635) Pulse Rate:  [54-86] 55 (01/18 0800) Resp:  [9-38] 11 (01/18 0800) BP: (108-141)/(46-93) 127/46 (01/18 0800) SpO2:  [94 %-100 %] 98 % (01/18 0800) Weight:  [68.7 kg-74 kg] 68.7 kg (01/17 2145)  Intake/Output from previous day: 01/17 0701 - 01/18 0700 In: 3959.2 [I.V.:846.2; IV Piggyback:1113] Out: 3225 [Urine:3225] Intake/Output this shift: Total I/O In: 203.8 [I.V.:203.8] Out: -    Physical Exam Vitals reviewed.  Constitutional:      Appearance: Normal appearance.  Genitourinary:    Comments: Urine in tubing is clear off of irrigation. Neurological:     Mental Status: He is alert.     Lab Results:  Recent Labs    06/27/24 2217 06/28/24 0307  WBC 17.8* 13.0*  HGB 7.8* 7.5*  HCT 24.9* 23.7*  PLT 275 272   BMET Recent Labs    06/27/24 1600 06/27/24 1616 06/28/24 0307  NA 139 138 138  K 4.2 4.0 4.3  CL 104 101 106  CO2 25  --  24  GLUCOSE 135* 125* 197*  BUN 18 19 16   CREATININE 1.04 1.20 0.82  CALCIUM  9.3  --  8.4*   PT/INR Recent Labs    06/27/24 1600 06/28/24 0307  LABPROT 16.9* 15.8*  INR 1.3* 1.2   ABG No results for input(s): PHART, HCO3 in the last 72 hours.  Invalid input(s): PCO2, PO2  Studies/Results: US  Pelvis Limited Result  Date: 06/27/2024 EXAM: PELVIC ULTRASOUND 06/27/2024 04:03:30 PM TECHNIQUE: Transabdominal pelvic duplex ultrasound using B-mode/gray scaled imaging with Doppler spectral analysis and color flow was obtained. COMPARISON: CT abdomen and pelvis 06/18/2024. CLINICAL HISTORY: assess for urinary retention/clot burden in bladder. FINDINGS: ULTRASOUND FINDINGS: UTERUS: Not applicable as the patient is male. ENDOMETRIAL STRIPE: Not applicable as the patient is male. RIGHT OVARY: Not applicable as the patient is  male. LEFT OVARY: Not applicable as the patient is male. BLADDER: Foley catheter in the bladder. There is a moderate to large amount of heterogeneous nonvascular echogenic material within the bladder surrounding the foley catheter, possibly related to hemorrhage. FREE FLUID: No free fluid. IMPRESSION: 1. Moderate to large amount of heterogeneous nonvascular echogenic material within the urinary bladder surrounding the Foley catheter, compatible with intravesical blood clot/hemorrhage. Electronically signed by: Greig Pique MD 06/27/2024 04:26 PM EST RP Workstation: [id number]   Labs reviewed.   Assessment and Plan: Clot retention with history of urethral trauma and urinary retention.  His UA is clear today off of CBI.   I will have the SP tube plugged and continue foley drainage for now.  For the immediate future I would leave the urethral foley for about 2 weeks to facilitate urethral healing and then remove it and place the SP tube to drainage for longer term bladder management.   Acute blood loss anemia.  I would hold the Eliquis  for the next several days to help reduce the risks of recurrent bleeding.        LOS: 0 days    Norleen Seltzer 06/28/2024

## 2024-06-29 ENCOUNTER — Encounter (HOSPITAL_COMMUNITY): Payer: Self-pay | Admitting: Urology

## 2024-06-29 DIAGNOSIS — Z79899 Other long term (current) drug therapy: Secondary | ICD-10-CM | POA: Diagnosis not present

## 2024-06-29 DIAGNOSIS — D6832 Hemorrhagic disorder due to extrinsic circulating anticoagulants: Secondary | ICD-10-CM | POA: Diagnosis present

## 2024-06-29 DIAGNOSIS — E1122 Type 2 diabetes mellitus with diabetic chronic kidney disease: Secondary | ICD-10-CM | POA: Diagnosis present

## 2024-06-29 DIAGNOSIS — R319 Hematuria, unspecified: Secondary | ICD-10-CM | POA: Diagnosis present

## 2024-06-29 DIAGNOSIS — I4891 Unspecified atrial fibrillation: Secondary | ICD-10-CM | POA: Diagnosis not present

## 2024-06-29 DIAGNOSIS — N1831 Chronic kidney disease, stage 3a: Secondary | ICD-10-CM | POA: Diagnosis present

## 2024-06-29 DIAGNOSIS — K5641 Fecal impaction: Secondary | ICD-10-CM | POA: Diagnosis not present

## 2024-06-29 DIAGNOSIS — I9589 Other hypotension: Secondary | ICD-10-CM

## 2024-06-29 DIAGNOSIS — E876 Hypokalemia: Secondary | ICD-10-CM | POA: Diagnosis present

## 2024-06-29 DIAGNOSIS — D62 Acute posthemorrhagic anemia: Secondary | ICD-10-CM | POA: Diagnosis present

## 2024-06-29 DIAGNOSIS — E861 Hypovolemia: Secondary | ICD-10-CM | POA: Diagnosis present

## 2024-06-29 DIAGNOSIS — J449 Chronic obstructive pulmonary disease, unspecified: Secondary | ICD-10-CM | POA: Diagnosis present

## 2024-06-29 DIAGNOSIS — R54 Age-related physical debility: Secondary | ICD-10-CM | POA: Diagnosis present

## 2024-06-29 DIAGNOSIS — I129 Hypertensive chronic kidney disease with stage 1 through stage 4 chronic kidney disease, or unspecified chronic kidney disease: Secondary | ICD-10-CM | POA: Diagnosis present

## 2024-06-29 DIAGNOSIS — G20A1 Parkinson's disease without dyskinesia, without mention of fluctuations: Secondary | ICD-10-CM | POA: Diagnosis present

## 2024-06-29 DIAGNOSIS — I480 Paroxysmal atrial fibrillation: Secondary | ICD-10-CM | POA: Diagnosis not present

## 2024-06-29 DIAGNOSIS — N401 Enlarged prostate with lower urinary tract symptoms: Secondary | ICD-10-CM | POA: Diagnosis present

## 2024-06-29 DIAGNOSIS — I10 Essential (primary) hypertension: Secondary | ICD-10-CM | POA: Diagnosis not present

## 2024-06-29 DIAGNOSIS — I4811 Longstanding persistent atrial fibrillation: Secondary | ICD-10-CM | POA: Diagnosis present

## 2024-06-29 DIAGNOSIS — Z7901 Long term (current) use of anticoagulants: Secondary | ICD-10-CM | POA: Diagnosis not present

## 2024-06-29 DIAGNOSIS — E785 Hyperlipidemia, unspecified: Secondary | ICD-10-CM | POA: Diagnosis present

## 2024-06-29 DIAGNOSIS — Z8249 Family history of ischemic heart disease and other diseases of the circulatory system: Secondary | ICD-10-CM | POA: Diagnosis not present

## 2024-06-29 DIAGNOSIS — Z885 Allergy status to narcotic agent status: Secondary | ICD-10-CM | POA: Diagnosis not present

## 2024-06-29 DIAGNOSIS — Z87891 Personal history of nicotine dependence: Secondary | ICD-10-CM | POA: Diagnosis not present

## 2024-06-29 DIAGNOSIS — R338 Other retention of urine: Secondary | ICD-10-CM | POA: Diagnosis present

## 2024-06-29 DIAGNOSIS — I959 Hypotension, unspecified: Secondary | ICD-10-CM

## 2024-06-29 DIAGNOSIS — I2510 Atherosclerotic heart disease of native coronary artery without angina pectoris: Secondary | ICD-10-CM | POA: Diagnosis present

## 2024-06-29 DIAGNOSIS — N3289 Other specified disorders of bladder: Secondary | ICD-10-CM | POA: Diagnosis present

## 2024-06-29 LAB — RETICULOCYTES
Immature Retic Fract: 12.1 % (ref 2.3–15.9)
RBC.: 2.34 MIL/uL — ABNORMAL LOW (ref 4.22–5.81)
Retic Count, Absolute: 65.8 K/uL (ref 19.0–186.0)
Retic Ct Pct: 2.8 % (ref 0.4–3.1)

## 2024-06-29 LAB — CBC WITH DIFFERENTIAL/PLATELET
Abs Immature Granulocytes: 0.13 K/uL — ABNORMAL HIGH (ref 0.00–0.07)
Basophils Absolute: 0 K/uL (ref 0.0–0.1)
Basophils Relative: 0 %
Eosinophils Absolute: 0.1 K/uL (ref 0.0–0.5)
Eosinophils Relative: 0 %
HCT: 22.1 % — ABNORMAL LOW (ref 39.0–52.0)
Hemoglobin: 7 g/dL — ABNORMAL LOW (ref 13.0–17.0)
Immature Granulocytes: 1 %
Lymphocytes Relative: 9 %
Lymphs Abs: 1 K/uL (ref 0.7–4.0)
MCH: 29.7 pg (ref 26.0–34.0)
MCHC: 31.7 g/dL (ref 30.0–36.0)
MCV: 93.6 fL (ref 80.0–100.0)
Monocytes Absolute: 0.9 K/uL (ref 0.1–1.0)
Monocytes Relative: 7 %
Neutro Abs: 10 K/uL — ABNORMAL HIGH (ref 1.7–7.7)
Neutrophils Relative %: 83 %
Platelets: 295 K/uL (ref 150–400)
RBC: 2.36 MIL/uL — ABNORMAL LOW (ref 4.22–5.81)
RDW: 15.3 % (ref 11.5–15.5)
WBC: 12.1 K/uL — ABNORMAL HIGH (ref 4.0–10.5)
nRBC: 0 % (ref 0.0–0.2)

## 2024-06-29 LAB — BASIC METABOLIC PANEL WITH GFR
Anion gap: 9 (ref 5–15)
BUN: 12 mg/dL (ref 8–23)
CO2: 24 mmol/L (ref 22–32)
Calcium: 8.8 mg/dL — ABNORMAL LOW (ref 8.9–10.3)
Chloride: 107 mmol/L (ref 98–111)
Creatinine, Ser: 0.84 mg/dL (ref 0.61–1.24)
GFR, Estimated: >60 mL/min
Glucose, Bld: 141 mg/dL — ABNORMAL HIGH (ref 70–99)
Potassium: 3.9 mmol/L (ref 3.5–5.1)
Sodium: 140 mmol/L (ref 135–145)

## 2024-06-29 LAB — VITAMIN B12: Vitamin B-12: 1064 pg/mL — ABNORMAL HIGH (ref 180–914)

## 2024-06-29 LAB — TYPE AND SCREEN
ABO/RH(D): A NEG
Antibody Screen: NEGATIVE

## 2024-06-29 LAB — FOLATE: Folate: 13 ng/mL

## 2024-06-29 LAB — PREPARE RBC (CROSSMATCH)

## 2024-06-29 LAB — FERRITIN: Ferritin: 159 ng/mL (ref 24–336)

## 2024-06-29 LAB — IRON AND TIBC
Iron: 29 ug/dL — ABNORMAL LOW (ref 45–182)
Saturation Ratios: 12 % — ABNORMAL LOW (ref 17.9–39.5)
TIBC: 238 ug/dL — ABNORMAL LOW (ref 250–450)
UIBC: 209 ug/dL

## 2024-06-29 MED ORDER — MUPIROCIN 2 % EX OINT
1.0000 | TOPICAL_OINTMENT | Freq: Two times a day (BID) | CUTANEOUS | Status: DC
  Administered 2024-06-29 – 2024-07-03 (×9): 1 via NASAL
  Filled 2024-06-29: qty 22

## 2024-06-29 MED ORDER — SODIUM CHLORIDE 0.9% IV SOLUTION: Freq: Once | INTRAVENOUS | Status: AC

## 2024-06-29 MED ORDER — HYDRALAZINE HCL 20 MG/ML IJ SOLN
10.0000 mg | Freq: Four times a day (QID) | INTRAMUSCULAR | Status: DC | PRN
  Administered 2024-06-29 – 2024-06-30 (×2): 10 mg via INTRAVENOUS
  Filled 2024-06-29 (×2): qty 1

## 2024-06-29 NOTE — Progress Notes (Signed)
 OT Cancellation Note  Patient Details Name: Michael Odonnell MRN: [id number] DOB: [birth year]   Cancelled Treatment:    Reason Eval/Treat Not Completed: Other (comment)  Patient receiving blood and nursing requests for therapy to check back for readiness soon.   Michael Odonnell OT/L Acute Rehabilitation Department  [phone]    06/29/2024, 10:54 AM

## 2024-06-29 NOTE — Progress Notes (Signed)
 PT Cancellation Note  Patient Details Name: Michael Odonnell MRN: [id number] DOB: Aug 02, 1940   Cancelled Treatment:    Reason Eval/Treat Not Completed: Medical issues which prohibited therapy (pt receiving blood, RN requested PT/OT check back later. Will follow.)   Sylvan Delon Copp PT 06/29/2024  Acute Rehabilitation Services  Office [phone]

## 2024-06-29 NOTE — Evaluation (Signed)
 Physical Therapy Evaluation Patient Details Name: Michael Odonnell MRN: [id number] DOB: [birth year] Today's Date: 06/29/2024  History of Present Illness  Patient is an 84 yo male admitted to [facility] for bladder hemorrhage s/p cystoscopy with evacuation of clots and fulguration of bleeders on 06/27/24. No not he was just d/cd to [facility] for rehab from [facility] on 06/25/24. PMH:  rehab followin L hip fx (s/p pinning) and L humeral fx ( nonoperative) in December. degenerative lumbar disc disease, HTN, HLD,  PD, GERD, PAF on eliquis , COPD, CKD stage 3a  Clinical Impression  Pt admitted with above diagnosis. Pt is not able to provide his prior level of function. When asked what he was working on at rehab, he stated, not much, but could not provide details. Today he required max assist of 2 for supine to sit, min assist for sitting balance 2* posterior lean, Min/mod assist of 2 to stand using handle of recliner for RUE support. Pt was able to stand for ~60 seconds, then fatigue. Pt performed seated balance exercises and BLE strengthening exerciese.  Pt currently with functional limitations due to the deficits listed below (see PT Problem List). Pt will benefit from acute skilled PT to increase their independence and safety with mobility to allow discharge.           If plan is discharge home, recommend the following: A lot of help with walking and/or transfers;A lot of help with bathing/dressing/bathroom;Assist for transportation;Help with stairs or ramp for entrance   Can travel by private vehicle   No    Equipment Recommendations None recommended by PT  Recommendations for Other Services       Functional Status Assessment Patient has had a recent decline in their functional status and demonstrates the ability to make significant improvements in function in a reasonable and predictable amount of time.     Precautions / Restrictions Precautions Precautions: Fall Recall of Precautions/Restrictions:  Impaired Precaution/Restrictions Comments: hx parkinson's Required Braces or Orthoses: Sling Restrictions Weight Bearing Restrictions Per Provider Order: Yes LUE Weight Bearing Per Provider Order: Non weight bearing LLE Weight Bearing Per Provider Order: Weight bearing as tolerated      Mobility  Bed Mobility         Supine to sit: HOB elevated, Max assist, +2 for physical assistance Sit to supine: Max assist, +2 for physical assistance, +2 for safety/equipment   General bed mobility comments: pt able to assist with advancing BLEs off of bed, assist to raise trunk and pivot hips to EOB, and then min A for balance in sitting 2* posterior lean    Transfers Overall transfer level: Needs assistance Equipment used: 2 person hand held assist Transfers: Sit to/from Stand Sit to Stand: From elevated surface, +2 safety/equipment, +2 physical assistance, Min assist           General transfer comment: sit to stand with RUE pulling up on handle on back of recliner. Pt able to stand for ~60 seconds, tolerance limited by fatigue. VCs to widen BOS and for flexed posture.    Ambulation/Gait                  Stairs            Wheelchair Mobility     Tilt Bed    Modified Rankin (Stroke Patients Only)       Balance Overall balance assessment: Needs assistance, History of Falls Sitting-balance support: Single extremity supported, Feet supported Sitting balance-Leahy Scale: Poor Sitting balance -  Comments: posterior lean in sitting without UE support; can maintain trunk in neutral with single UE and BLE support Postural control: Posterior lean Standing balance support: Single extremity supported, During functional activity Standing balance-Leahy Scale: Poor Standing balance comment: needs MIN A for static stand                             Pertinent Vitals/Pain Pain Assessment Faces Pain Scale: Hurts a little bit Pain Location: L shoulder Pain  Descriptors / Indicators: Tightness Pain Intervention(s): Limited activity within patient's tolerance, Monitored during session, Repositioned    Home Living Family/patient expects to be discharged to:: Skilled nursing facility                        Prior Function Prior Level of Function : Needs assist;History of Falls (last six months);Patient poor historian/Family not available             Mobility Comments: pt vague regarding mobility at rehab facility ADLs Comments: assist needed     Extremity/Trunk Assessment   Upper Extremity Assessment Upper Extremity Assessment: Defer to OT evaluation LUE Deficits / Details: L proximal humerus fx, currently in sling to mobilize    Lower Extremity Assessment Lower Extremity Assessment: Generalized weakness;LLE deficits/detail;RLE deficits/detail RLE Deficits / Details: knee ext 4/5 RLE Sensation: decreased light touch LLE Deficits / Details: knee ext 4/5 LLE Sensation: decreased light touch    Cervical / Trunk Assessment Cervical / Trunk Assessment: Kyphotic  Communication   Communication Communication: Impaired Factors Affecting Communication: Difficulty expressing self;Hearing impaired    Cognition Arousal: Alert Behavior During Therapy: WFL for tasks assessed/performed                             Following commands: Impaired Following commands impaired: Only follows one step commands consistently, Follows one step commands with increased time     Cueing Cueing Techniques: Verbal cues, Gestural cues, Visual cues, Tactile cues     General Comments      Exercises General Exercises - Lower Extremity Ankle Circles/Pumps: AROM, Both, 10 reps, Supine Long Arc Quad: AROM, Both, 10 reps, Seated Hip Flexion/Marching: Both, Seated, 10 reps, AROM  Reaching forward and to L and to R in sitting x 3.  Neck rotation AROM x 5 to L and to R in sitting.    Assessment/Plan    PT Assessment Patient needs  continued PT services  PT Problem List Decreased strength;Decreased range of motion;Decreased activity tolerance;Decreased balance;Decreased mobility;Decreased cognition;Decreased knowledge of use of DME;Decreased safety awareness;Decreased knowledge of precautions       PT Treatment Interventions DME instruction;Gait training;Stair training;Therapeutic activities;Functional mobility training;Therapeutic exercise;Balance training;Neuromuscular re-education;Patient/family education;Wheelchair mobility training    PT Goals (Current goals can be found in the Care Plan section)  Acute Rehab PT Goals Patient Stated Goal: to improve mobility PT Goal Formulation: With patient Time For Goal Achievement: 07/13/24 Potential to Achieve Goals: Good    Frequency Min 2X/week     Co-evaluation PT/OT/SLP Co-Evaluation/Treatment: Yes Reason for Co-Treatment: Complexity of the patient's impairments (multi-system involvement);To address functional/ADL transfers;For patient/therapist safety PT goals addressed during session: Mobility/safety with mobility;Balance;Strengthening/ROM         AM-PAC PT 6 Clicks Mobility  Outcome Measure Help needed turning from your back to your side while in a flat bed without using bedrails?: A Lot Help needed moving from lying on your  back to sitting on the side of a flat bed without using bedrails?: A Lot Help needed moving to and from a bed to a chair (including a wheelchair)?: Total Help needed standing up from a chair using your arms (e.g., wheelchair or bedside chair)?: A Lot Help needed to walk in hospital room?: Total Help needed climbing 3-5 steps with a railing? : Total 6 Click Score: 9    End of Session Equipment Utilized During Treatment: Gait belt Activity Tolerance: Patient tolerated treatment well Patient left: in bed;with call bell/phone within reach;with bed alarm set Nurse Communication: Mobility status PT Visit Diagnosis: Muscle weakness  (generalized) (M62.81);History of falling (Z91.81);Difficulty in walking, not elsewhere classified (R26.2);Unsteadiness on feet (R26.81)    Time: 8669-8643 PT Time Calculation (min) (ACUTE ONLY): 26 min   Charges:   PT Evaluation $PT Eval Moderate Complexity: 1 Mod PT Treatments $Therapeutic Activity: 8-22 mins PT General Charges $$ ACUTE PT VISIT: 1 Visit         Arman Delon Copp PT 06/29/2024  Acute Rehabilitation Services  Office [phone]

## 2024-06-29 NOTE — Discharge Instructions (Signed)
 Urology instructions and follow up:  - OK to shower. Avoid soaking until follow up.  - Clean suprapubic tube and foley catheter with gentle soap and warm water - Will plan for suprapubic exchange and foley catheter removal on or about 07/20/24 - if not already scheduled, contact Urology to schedule appointment once you have returned home.   [facility] [address] floor [place], [place]  [address] [phone]

## 2024-06-29 NOTE — Progress Notes (Addendum)
"  °  Progress Note  Patient Name: Michael Odonnell Date of Encounter: 06/29/2024 [facility] Cardiologist: Redell Shallow, MD   Interval Summary   Laying in bed during the interview.  Denies any chest pain, shortness of breath, lower extremity edema, orthopnea.  Vital Signs Vitals:   06/29/24 0909 06/29/24 1000 06/29/24 1100 06/29/24 1204  BP: (!) 131/41 117/64 (!) 110/39   Pulse: (!) 52 (!) 56 61   Resp: 11 13 15    Temp: 97.8 F (36.6 C)   97.7 F (36.5 C)  TempSrc: Oral   Oral  SpO2: 97% 95% 96%   Weight:      Height:        Intake/Output Summary (Last 24 hours) at 06/29/2024 1214 Last data filed at 06/29/2024 1100 Gross per 24 hour  Intake 118 ml  Output 2775 ml  Net -2657 ml      06/27/2024    9:45 PM 06/27/2024    2:41 PM 06/18/2024    9:19 PM  Last 3 Weights  Weight (lbs) 151 lb 7.3 oz 163 lb 2.3 oz 163 lb 2.3 oz  Weight (kg) 68.7 kg 74 kg 74 kg      Telemetry/ECG  NSR in the 50-70's - Personally Reviewed  Physical Exam  GEN: No acute distress.  Alert and orientated for the most part but thought that he was currently at [facility]. Neck: No JVD Cardiac: RRR, no murmurs, rubs, or gallops.  Respiratory: Clear to auscultation bilaterally. GI: Soft, nontender, non-distended.  Has catheter bag near the bed with clear yellow urine. MS: No edema  Assessment & Plan  Is an 84 year old male with a prior history of Parkinson's, hypertension, atrial fibrillation, coronary calcifications, and LVH  Hematuria Patient was hospitalized for gross hematuria. On 06/18/2024 the patient had a suprapubic catheter placed by urology.  Urology was consulted and did a cystoscopy that showed clot retention and recent bulbar urethral trauma. Labs showed anemia with hemoglobin of 7.   Paroxysmal atrial fibrillation CHA2DS2-VASc Score = 4 [CHF History: 0, HTN History: 1, Diabetes History: 0, Stroke History: 0, Vascular Disease History: 1, Age Score: 2, Gender Score: 0].   Therefore, the patient's annual risk of stroke is 4.8 %.    Because of the hematuria the patient's Eliquis  was held.  Because of concerns for hypotension the patient's calcium  channel blocker was stopped.  Patient's blood pressure remains slightly soft.  His recent BP was 110/39.  He is currently in sinus rhythm on telemetry. Urology felt like the patient may restart his Eliquis  if he has no hematuria for 48 hours. Restart Eliquis  on 07/01/2024 if urine remains clear with no further bleeding Will order echocardiogram given hypotension in the setting of afib - last echo in 2021  Otherwise management per primary.  For questions or updates, please contact [facility] Please consult [email or web address] for contact info under   Michael KYM Maxcy, MD, [facility], FNLA, FACP  [place]  [facility]  Medical Director of the [facility]  [facility] Diplomate of the American Board of Clinical Lipidology Attending Cardiologist  Direct Dial: [phone]  Fax: [phone]  Website:  [email or web address]  Michael Clause, PA-C   "

## 2024-06-29 NOTE — Progress Notes (Signed)
" ° °  2 Days Post-Op Subjective: Urinary retention necessitating SPT overnight. Pt was lethargic with questionable mentation on rounds. Reviewed case and plan with his wife.   Objective: Vital signs in last 24 hours: Temp:  [97.7 F (36.5 C)-98.7 F (37.1 C)] 97.7 F (36.5 C) (01/19 0826) Pulse Rate:  [55-75] 61 (01/19 0826) Resp:  [12-25] 15 (01/19 0826) BP: (119-191)/(38-159) 149/51 (01/19 0826) SpO2:  [85 %-100 %] 95 % (01/19 0826)  Assessment/Plan: 84 year old male with history of Parkinson's, BPH, receiving tamsulosin , finasteride , and Myrbetriq .  Traumatic foley catheter obliterated membranous urethra without true lumen identified.  SPT placed by Dr. Devere 06/18/2024. Pt readmitted with clot obstruction of urine after anticoagulation were restarted. OR cysto with prostate fulguration and foley placement.   # Obliterated membranous urethra # clot obstruction of urine- resolved  S/p SPT placement with Dr. Devere 06/18/24. Overwire exchange in clinic in about 30 days.  S/p cysto with fulguration and foley placement with Dr. Watt 1/17. CBI run through SPT. Urine clear for over 24hrs. CBI clamped. Nursing to plug SPT today.  Foley to stay in place for 2 weeks for urethral healing, then consider removal in clinic and long term SPT drainage. Please hold anticoagulation until hematuria has been absent for 48 hrs if medically reasonable. Urine clear, no further urologic interventions during this hospitalization. Follow up info included in discharge instructions. Urology will sign off at this time. Please call with questions, concerns, or acute urologic changes.   Intake/Output from previous day: 01/18 0701 - 01/19 0700 In: 203.8 [I.V.:203.8] Out: 3250 [Urine:3250]  Intake/Output this shift: No intake/output data recorded.  Physical Exam:  General: Alert and oriented CV: No cyanosis Lungs: equal chest rise Gu: New SPT in place with CBI clamped  Suture intact. C/D/I.  Urethral foley  draining clear yellow urine  Lab Results: Recent Labs    06/27/24 2217 06/28/24 0307 06/29/24 0249  HGB 7.8* 7.5* 7.0*  HCT 24.9* 23.7* 22.1*   BMET Recent Labs    06/28/24 0307 06/29/24 0249  NA 138 140  K 4.3 3.9  CL 106 107  CO2 24 24  GLUCOSE 197* 141*  BUN 16 12  CREATININE 0.82 0.84  CALCIUM  8.4* 8.8*  HGB 7.5* 7.0*  WBC 13.0* 12.1*     Studies/Results: US  Pelvis Limited Result Date: 06/27/2024 EXAM: PELVIC ULTRASOUND 06/27/2024 04:03:30 PM TECHNIQUE: Transabdominal pelvic duplex ultrasound using B-mode/gray scaled imaging with Doppler spectral analysis and color flow was obtained. COMPARISON: CT abdomen and pelvis 06/18/2024. CLINICAL HISTORY: assess for urinary retention/clot burden in bladder. FINDINGS: ULTRASOUND FINDINGS: UTERUS: Not applicable as the patient is male. ENDOMETRIAL STRIPE: Not applicable as the patient is male. RIGHT OVARY: Not applicable as the patient is male. LEFT OVARY: Not applicable as the patient is male. BLADDER: Foley catheter in the bladder. There is a moderate to large amount of heterogeneous nonvascular echogenic material within the bladder surrounding the foley catheter, possibly related to hemorrhage. FREE FLUID: No free fluid. IMPRESSION: 1. Moderate to large amount of heterogeneous nonvascular echogenic material within the urinary bladder surrounding the Foley catheter, compatible with intravesical blood clot/hemorrhage. Electronically signed by: Greig Pique MD 06/27/2024 04:26 PM EST RP Workstation: [id number]      LOS: 0 days   Ole Bourdon, NP [facility] Pager: [phone]  06/29/2024, 8:59 AM  "

## 2024-06-29 NOTE — Progress Notes (Signed)
 After updating Dr. Royal AM dose of Cardizem  held d/t bradycardia.

## 2024-06-29 NOTE — Anesthesia Postprocedure Evaluation (Signed)
"   Anesthesia Post Note  Patient: Michael Odonnell  Procedure(s) Performed: CYSTOSCOPY, urethral dilitation, bladder fulguration and clot evacuation CYSTOSCOPY, WITH BLADDER FULGURATION, Urethral dilitation CYSTOSCOPY, WITH URETHRAL DILATION     Patient location during evaluation: PACU Anesthesia Type: General Level of consciousness: awake and alert Pain management: pain level controlled Vital Signs Assessment: post-procedure vital signs reviewed and stable Respiratory status: spontaneous breathing, nonlabored ventilation, respiratory function stable and patient connected to nasal cannula oxygen Cardiovascular status: stable and blood pressure returned to baseline Anesthetic complications: no   No notable events documented.  Last Vitals:  Vitals:   06/29/24 0900 06/29/24 0909  BP: (!) 134/56 (!) 131/41  Pulse: (!) 50 (!) 52  Resp: 16 11  Temp:  36.6 C  SpO2: 95%     Last Pain:  Vitals:   06/29/24 0826  TempSrc: Oral  PainSc: 0-No pain                 Debby FORBES Like      "

## 2024-06-29 NOTE — Evaluation (Signed)
 Occupational Therapy Evaluation Patient Details Name: Michael Odonnell MRN: [id number] DOB: April 02, 1941 Today's Date: 06/29/2024   History of Present Illness   Patient is an 84 yo male admitted to [facility] for bladder hemorrhage s/p cystoscopy with evacuation of clots and fulguration of bleeders on 06/27/24. No not he was just d/cd to [facility] for rehab from [facility] on 06/25/24. PMH:  rehab followin L hip fx (s/p pinning) and L humeral fx ( nonoperative) in December. degenerative lumbar disc disease, HTN, HLD,  PD, GERD, PAF on eliquis , COPD, CKD stage 3a     Clinical Impressions Patient presenting from SNF level rehab, reporting working on mobility and ADL's since hospital d/c mid January and seen for initial evaluation in ICU. Currently, patient presents with deficits outlined below (see OT Problem List for details) most significantly pain, generalized muscle weakness, decreased motor control/coordination,  L UE ROM deficits in sling post non-operative humeral fx, decreased balance, activity tolerance on supplemental O2 this session and decreased cognition. Patient will benefit from continued inpatient follow up therapy, <3 hours/day. Patient requires continued Acute care hospital level OT services to progress safety and functional performance and allow for discharge.       If plan is discharge home, recommend the following:   Two people to help with walking and/or transfers;Two people to help with bathing/dressing/bathroom;Assistance with feeding;Assist for transportation     Functional Status Assessment   Patient has had a recent decline in their functional status and demonstrates the ability to make significant improvements in function in a reasonable and predictable amount of time.     Equipment Recommendations   Other (comment)      Precautions/Restrictions   Precautions Precautions: Fall Recall of Precautions/Restrictions: Impaired Precaution/Restrictions Comments: hx  parkinson's Required Braces or Orthoses: Sling Restrictions Weight Bearing Restrictions Per Provider Order: Yes LUE Weight Bearing Per Provider Order: Non weight bearing LLE Weight Bearing Per Provider Order: Weight bearing as tolerated     Mobility Bed Mobility Overal bed mobility: Needs Assistance       Supine to sit: HOB elevated, Max assist, +2 for physical assistance Sit to supine: Max assist, +2 for physical assistance, +2 for safety/equipment   General bed mobility comments: pt able to assist with advancing BLEs off of bed, assist to raise trunk and pivot hips to EOB, and then min A for balance in sitting 2* posterior lean    Transfers Overall transfer level: Needs assistance Equipment used: 2 person hand held assist Transfers: Sit to/from Stand Sit to Stand: From elevated surface, +2 safety/equipment, +2 physical assistance, Min assist           General transfer comment: sit to stand with RUE pulling up on handle on back of recliner. Pt able to stand for ~60 seconds, tolerance limited by fatigue. VCs to widen BOS and for flexed posture.      Balance Overall balance assessment: Needs assistance, History of Falls Sitting-balance support: Single extremity supported, Feet supported Sitting balance-Leahy Scale: Poor Sitting balance - Comments: posterior lean in sitting without UE support; can maintain trunk in neutral with single UE and BLE support Postural control: Posterior lean Standing balance support: Single extremity supported, During functional activity Standing balance-Leahy Scale: Poor Standing balance comment: needs MIN A for static stand                           ADL either performed or assessed with clinical judgement   ADL Overall ADL's :  Needs assistance/impaired Eating/Feeding: Set up;Sitting Eating/Feeding Details (indicate cue type and reason): using non dom r UE Grooming: Wash/dry hands;Wash/dry face;Minimal assistance;Bed level    Upper Body Bathing: Maximal assistance;Sitting   Lower Body Bathing: Total assistance;Bed level   Upper Body Dressing : Maximal assistance;Sitting Upper Body Dressing Details (indicate cue type and reason): including sling mngt Lower Body Dressing: Total assistance;Bed level       Toileting- Clothing Manipulation and Hygiene: Total assistance Toileting - Clothing Manipulation Details (indicate cue type and reason): has indwelling catheter for urine mngt     Functional mobility during ADLs: Maximal assistance;+2 for physical assistance;+2 for safety/equipment General ADL Comments: Pt able to stand ~30 seconds with +2 (foot/L knee blocked with HHA RUE given NWB LUE). Pt with poor tolerance and fatigues quickly. Returned to bed after incontinent of BM, pt unable to tolerate transfer or standing x2 for pericare     Vision Baseline Vision/History: 1 Wears glasses Ability to See in Adequate Light: 0 Adequate Patient Visual Report: No change from baseline              Pertinent Vitals/Pain Pain Assessment Pain Assessment: Faces Faces Pain Scale: Hurts a little bit Breathing: normal Negative Vocalization: none Facial Expression: smiling or inexpressive Body Language: relaxed Consolability: no need to console PAINAD Score: 0 Pain Location: L shoulder Pain Descriptors / Indicators: Tightness Pain Intervention(s): Limited activity within patient's tolerance, Monitored during session, Repositioned     Extremity/Trunk Assessment Upper Extremity Assessment Upper Extremity Assessment: Left hand dominant;Generalized weakness;LUE deficits/detail;RUE deficits/detail RUE Coordination: decreased fine motor (mild resting hand tremor) LUE Deficits / Details: L proximal humerus fx, currently in sling to mobilize, good distal ROM hand thru thumb with + resting tremor noted LUE: Unable to fully assess due to immobilization LUE Coordination: decreased fine motor;decreased gross motor   Lower  Extremity Assessment Lower Extremity Assessment: Defer to PT evaluation RLE Deficits / Details: knee ext 4/5 RLE Sensation: decreased light touch LLE Deficits / Details: knee ext 4/5 LLE Sensation: decreased light touch   Cervical / Trunk Assessment Cervical / Trunk Assessment: Kyphotic   Communication Communication Communication: Impaired Factors Affecting Communication: Difficulty expressing self;Hearing impaired   Cognition Arousal: Alert Behavior During Therapy: WFL for tasks assessed/performed Cognition: Cognition impaired     Awareness: Intellectual awareness impaired, Online awareness impaired Memory impairment (select all impairments): Working memory Attention impairment (select first level of impairment): Selective attention Executive functioning impairment (select all impairments): Organization, Sequencing, Reasoning, Problem solving OT - Cognition Comments: motivated and eager for therapy presented, mild decreased insight and judgement but no impulsivity                 Following commands: Impaired Following commands impaired: Only follows one step commands consistently, Follows one step commands with increased time     Cueing  General Comments   Cueing Techniques: Verbal cues;Gestural cues;Visual cues;Tactile cues  no B LE edema, notified RN of bloody drainage at penis at catheter site present prior and during EOB sitting, fatigues easily, stood from EOB then returned to seated for light therex and then to supine, BP 128/50 post standing and sats 100% with 2 ltrs via King   Exercises Exercises: General Upper Extremity, General Lower Extremity General Exercises - Upper Extremity Shoulder Flexion: AROM, Right, 10 reps Shoulder Extension: AROM, Right, 10 reps Other Exercises Other Exercises: reaching with RUE forward and to L and R x 3 Other Exercises: neck rotation AROM x 5 to L and R  Shoulder Instructions      Home Living Family/patient expects to be  discharged to:: Skilled nursing facility                                        Prior Functioning/Environment Prior Level of Function : Needs assist;History of Falls (last six months);Patient poor historian/Family not available             Mobility Comments: pt vague regarding mobility at rehab facility ADLs Comments: assist needed    OT Problem List: Decreased strength;Decreased range of motion;Decreased activity tolerance;Impaired balance (sitting and/or standing);Decreased cognition;Decreased safety awareness;Decreased knowledge of use of DME or AE;Decreased knowledge of precautions;Impaired UE functional use;Decreased coordination;Pain;Increased edema   OT Treatment/Interventions: Self-care/ADL training;Therapeutic exercise;DME and/or AE instruction;Therapeutic activities;Patient/family education;Balance training;Cognitive remediation/compensation      OT Goals(Current goals can be found in the care plan section)   Acute Rehab OT Goals Patient Stated Goal: to keep getting stronger OT Goal Formulation: With patient Time For Goal Achievement: 07/13/24 Potential to Achieve Goals: Fair ADL Goals Pt Will Perform Eating: with set-up;sitting;with adaptive utensils Pt Will Perform Grooming: with contact guard assist;sitting;with adaptive equipment Pt Will Perform Upper Body Bathing: with mod assist;with adaptive equipment;sitting Pt Will Perform Upper Body Dressing: with mod assist;sitting Pt Will Transfer to Toilet: with mod assist;stand pivot transfer;bedside commode;with +2 assist   OT Frequency:  Min 2X/week    Co-evaluation PT/OT/SLP Co-Evaluation/Treatment: Yes Reason for Co-Treatment: Complexity of the patient's impairments (multi-system involvement);To address functional/ADL transfers;For patient/therapist safety PT goals addressed during session: Mobility/safety with mobility;Balance;Strengthening/ROM        AM-PAC OT 6 Clicks Daily Activity      Outcome Measure Help from another person eating meals?: A Little Help from another person taking care of personal grooming?: A Little Help from another person toileting, which includes using toliet, bedpan, or urinal?: Total Help from another person bathing (including washing, rinsing, drying)?: A Lot Help from another person to put on and taking off regular upper body clothing?: A Lot Help from another person to put on and taking off regular lower body clothing?: Total 6 Click Score: 12   End of Session Equipment Utilized During Treatment: Other (comment) Nurse Communication: Mobility status  Activity Tolerance: Patient limited by fatigue Patient left: in bed;with call bell/phone within reach;with family/visitor present;with bed alarm set  OT Visit Diagnosis: Unsteadiness on feet (R26.81);Other abnormalities of gait and mobility (R26.89);Muscle weakness (generalized) (M62.81)                Time: 8669-8643 OT Time Calculation (min): 26 min Charges:  OT General Charges $OT Visit: 1 Visit OT Evaluation $OT Eval Moderate Complexity: 1 Mod  Anarosa Kubisiak OT/L Acute Rehabilitation Department  [phone]  06/29/2024, 3:01 PM

## 2024-06-29 NOTE — Anesthesia Postprocedure Evaluation (Signed)
"   Anesthesia Post Note  Patient: Michael Odonnell  Procedure(s) Performed: CYSTOSCOPY, PLACEMENT OF SUPRAPUBIC CATHETER, FLEXIBLE CYSTOSCOPY     Patient location during evaluation: PACU Anesthesia Type: General Level of consciousness: awake and alert Pain management: pain level controlled Vital Signs Assessment: post-procedure vital signs reviewed and stable Respiratory status: spontaneous breathing, nonlabored ventilation, respiratory function stable and patient connected to nasal cannula oxygen Cardiovascular status: blood pressure returned to baseline and stable Postop Assessment: no apparent nausea or vomiting Anesthetic complications: no   No notable events documented.       Lynwood MARLA Cornea      "

## 2024-06-29 NOTE — Progress Notes (Addendum)
 TR[facility]  Michael Odonnell FMW:[id number]  DOB: [birth year]  DOA: 06/27/2024  PCP: Nichole Senior, MD  06/29/2024,11:58 AM  LOS: 0 days    Code Status:  Full code     [facility]  [name]  84 yr male Known history of chronic A-fib CHADVASC >4 on Eliquis  HTN Parkinson's disease ,?prior delirium  Admit 12/13--12/22 Hip # Dr. Georgina and repair by him-WBAT Left lower extremity--NWB left upper extremity and keep in sling readmitted 12/17 through 06/01/2024 with delirium in the setting of missing doses of Sinemet -and was discharged back to [facility]--at that admission Eliquis  was held briefly and then resumed   1/8-1/15 readmit [facility] gross hematuria-he had decreased urine output and at the facility they tried to place Foley and then they found blood as well as clots and bloody output at the meatus--- there was found to be an obliterated urethral meatus without true lumen identified---1 urology place suprapubic 1/8 16 French and signed off 1/9 recommending 4-week exchange to allow for epithelialization and?  Ureteric construction  1/17 came back to ED with bright red bleeding around suprapubic in the setting of recent resumption of apixaban  and today after PT-Foley was not draining in the ER and Dr. Watt urology was consulted became tachycardic 180s hypotensive hemoglobin dropped from around 9--->7.5-treated with Kcentra  Found to also been sinus tach with A-fib RVR on arrival and started amiodarone  gtt. and cardiology consulted also Bedside attempt at Foley flushing was performed but this failed went to the OR for tube exchange and clot evacuation under sedation -Went for emergent surgery after the Kcentra -and clot was removed and clot retention from probable urethral trauma was managed-underwent placement of a 22 French council catheter over wire into the bladder and balloon was filled with 20 mL-suprapubic tube was then also irrigated 1/19 transfuse 1 unit PRBC   Assessment  & Plan :     Urethral trauma with clot retention--- recent urethral trauma last admission with suprapubic catheter placement and retention Per urology--urethral catheter 22 French placed--has suprapubic 16 French in place as well -SP tube plugged -- CBT not running--- resume Flomax  0.4 Myrbetriq  50 finasteride  5---urology continuing Foley catheter drainage and urethra for 2 weeks for urethral healing  BC x 2 neg-no role for antibiotics with just bleeding that is resolved Pain control Tylenol  650 every 6 as needed, oxycodone  IR 5 every 4 as needed moderate pain--BNO suppository every 6 as needed spasm,-Robaxin  held  SVT on admission?  Rapid A-fib in the setting of known chronic A-fib Cardiology consulted-amiodarone  discontinued 1/18 in setting of conversion to sinus Has been a little hypotensive with some bradycardia into the 50s so Cardizem  completely held Would not use Eliquis  5 twice daily for at least 1 week or so given several recent episodes of bleeding from bladder and cautious resumption would actually defer to cardiology as an outpatient to continue this Holding lisinopril  20 at this time  Anemia of acute blood loss Hemoglobin 7 so transfusing 1 unit 1/19 and recheck labs tomorrow Will give IV iron  tomorrow  Parkinson's disease Continue Sinemet  2 tabs daily and 1 tab 3 times daily as per orders  Previous fecal impaction CT suggestive of stercoral colitis in addition to impaction so we will give enema and start MiraLAX  twice daily 17 mg  Recent fall with left hip fracture weightbearing as tolerated Nonweightbearing left upper extremity-proximal humerus fracture CT chest is suggestive of fluid collection 6 x 3 x 3 cm around proximal femoral diaphysis intramuscularly-outpatient follow-up Pain control  as above-both PT OT requested to see in consult on patient and likely can return to skilled facility Outpatient further follow-up as per Dr. Carloyn him-seems to have 07/20/2024 appointment which  she can keep  Data Reviewed today: Sodium 140 potassium 3.9 bicarb 24 BUN/creatinine 12/0.8 Iron  29 sat ratio 12 WBC 12.1 hemoglobin 7 platelet 295  DVT prophylaxis: SCD at this time  Dispo/Global plan:wife [name] updated [phone] at the bedside on 1/18-he is stable to go to telemetry for monitoring given his SVT and likely will need skilled in the next several days    Subjective:   Nursing indicates heart rates in the 40s and 50s with rest Cardizem  held Overall seems fair no distress has a little bit of suprapubic discomfort no nausea no vomiting  Objective + exam Vitals:   06/29/24 0900 06/29/24 0909 06/29/24 1000 06/29/24 1100  BP: (!) 134/56 (!) 131/41 117/64 (!) 110/39  Pulse: (!) 50 (!) 52 (!) 56 61  Resp: 16 11 13 15   Temp:  97.8 F (36.6 C)    TempSrc:  Oral    SpO2: 95% 97% 95% 96%  Weight:      Height:       Filed Weights   06/27/24 1441 06/27/24 2145  Weight: 74 kg 68.7 kg   Examination: EOMI NCAT looks well feels fair No icterus no pallor neck soft supple moderate in addition S1-S2 regular rate rhythm sinus shows sinus rhythm Abdomen soft no rebound no guarding ROM intact moving 4 limbs equally Power 5/5  Scheduled Meds:  bisacodyl   10 mg Oral Q0600   carbidopa -levodopa   2 tablet Oral Q0600   carbidopa -levodopa   1 tablet Oral TID   Chlorhexidine  Gluconate Cloth  6 each Topical Daily   finasteride   5 mg Oral Daily   magnesium  oxide  400 mg Oral QPM   mirabegron  ER  50 mg Oral q1600   mupirocin  ointment  1 Application Nasal BID   polyethylene glycol  17 g Oral Daily   saccharomyces boulardii  250 mg Oral BID   tamsulosin   0.4 mg Oral QHS   Continuous Infusions:  sodium chloride  irrigation Stopped (06/28/24 1622)   acetaminophen  **OR** acetaminophen , opium -belladonna, hydrALAZINE , mouth rinse, oxyCODONE   I spent34 minutes today before, during and after this patient interview and examination--reviewing pertinent data, coordinating the patient's  care and in communication with the care team and other medical professionals  Colen Grimes, MD  [facility]

## 2024-06-30 ENCOUNTER — Inpatient Hospital Stay (HOSPITAL_COMMUNITY)

## 2024-06-30 DIAGNOSIS — I4891 Unspecified atrial fibrillation: Secondary | ICD-10-CM

## 2024-06-30 DIAGNOSIS — I10 Essential (primary) hypertension: Secondary | ICD-10-CM

## 2024-06-30 DIAGNOSIS — I9589 Other hypotension: Secondary | ICD-10-CM | POA: Diagnosis not present

## 2024-06-30 DIAGNOSIS — N3289 Other specified disorders of bladder: Secondary | ICD-10-CM | POA: Diagnosis not present

## 2024-06-30 DIAGNOSIS — I480 Paroxysmal atrial fibrillation: Secondary | ICD-10-CM | POA: Diagnosis not present

## 2024-06-30 LAB — BPAM RBC
Blood Product Expiration Date: 202601292359
ISSUE DATE / TIME: 202601190849
Unit Type and Rh: 600

## 2024-06-30 LAB — CBC WITH DIFFERENTIAL/PLATELET
Abs Immature Granulocytes: 0.07 K/uL (ref 0.00–0.07)
Basophils Absolute: 0.1 K/uL (ref 0.0–0.1)
Basophils Relative: 1 %
Eosinophils Absolute: 0.3 K/uL (ref 0.0–0.5)
Eosinophils Relative: 3 %
HCT: 27.5 % — ABNORMAL LOW (ref 39.0–52.0)
Hemoglobin: 9.2 g/dL — ABNORMAL LOW (ref 13.0–17.0)
Immature Granulocytes: 1 %
Lymphocytes Relative: 16 %
Lymphs Abs: 1.6 K/uL (ref 0.7–4.0)
MCH: 30.2 pg (ref 26.0–34.0)
MCHC: 33.5 g/dL (ref 30.0–36.0)
MCV: 90.2 fL (ref 80.0–100.0)
Monocytes Absolute: 0.9 K/uL (ref 0.1–1.0)
Monocytes Relative: 9 %
Neutro Abs: 7.4 K/uL (ref 1.7–7.7)
Neutrophils Relative %: 70 %
Platelets: 351 K/uL (ref 150–400)
RBC: 3.05 MIL/uL — ABNORMAL LOW (ref 4.22–5.81)
RDW: 15 % (ref 11.5–15.5)
WBC: 10.4 K/uL (ref 4.0–10.5)
nRBC: 0 % (ref 0.0–0.2)

## 2024-06-30 LAB — ECHOCARDIOGRAM COMPLETE
Area-P 1/2: 2.74 cm2
Calc EF: 63.3 %
Height: 69 in
S' Lateral: 3 cm
Single Plane A2C EF: 66.8 %
Single Plane A4C EF: 58.9 %
Weight: 2423.3 [oz_av]

## 2024-06-30 LAB — TYPE AND SCREEN
ABO/RH(D): A NEG
Antibody Screen: NEGATIVE
Unit division: 0

## 2024-06-30 LAB — BASIC METABOLIC PANEL WITH GFR
Anion gap: 9 (ref 5–15)
BUN: 9 mg/dL (ref 8–23)
CO2: 27 mmol/L (ref 22–32)
Calcium: 9.2 mg/dL (ref 8.9–10.3)
Chloride: 105 mmol/L (ref 98–111)
Creatinine, Ser: 0.77 mg/dL (ref 0.61–1.24)
GFR, Estimated: >60 mL/min
Glucose, Bld: 109 mg/dL — ABNORMAL HIGH (ref 70–99)
Potassium: 3.4 mmol/L — ABNORMAL LOW (ref 3.5–5.1)
Sodium: 141 mmol/L (ref 135–145)

## 2024-06-30 MED ORDER — APIXABAN 5 MG PO TABS
5.0000 mg | ORAL_TABLET | Freq: Two times a day (BID) | ORAL | Status: DC
  Administered 2024-07-01 – 2024-07-03 (×5): 5 mg via ORAL
  Filled 2024-06-30 (×5): qty 1

## 2024-06-30 MED ORDER — LISINOPRIL 20 MG PO TABS
20.0000 mg | ORAL_TABLET | Freq: Every day | ORAL | Status: DC
  Administered 2024-07-02 – 2024-07-03 (×2): 20 mg via ORAL
  Filled 2024-06-30 (×4): qty 1

## 2024-06-30 MED ORDER — DILTIAZEM HCL ER COATED BEADS 120 MG PO CP24
120.0000 mg | ORAL_CAPSULE | Freq: Every day | ORAL | Status: DC
  Administered 2024-06-30 – 2024-07-03 (×4): 120 mg via ORAL
  Filled 2024-06-30 (×4): qty 1

## 2024-06-30 MED ORDER — IRON SUCROSE 200 MG IVPB - SIMPLE MED
200.0000 mg | Freq: Once | Status: AC
  Administered 2024-06-30: 200 mg via INTRAVENOUS
  Filled 2024-06-30: qty 200

## 2024-06-30 MED ORDER — SODIUM CHLORIDE 0.9 % IV SOLN: 3.0000 mg/kg | Freq: Once | INTRAVENOUS | Status: DC

## 2024-06-30 NOTE — TOC Initial Note (Signed)
 Transition of Care [facility]) - Initial/Assessment Note    Patient Details  Name: Michael Odonnell MRN: [id number] Date of Birth: 12-23-1940  [facility] CM/SW Contact:    Tawni CHRISTELLA Eva, LCSW Phone Number: 06/30/2024, 1:37 PM  Clinical Narrative:                  CSW spoke with the pt and the pts family. Pt is a short-term rehabilitation resdent at [facility]. The family stated that the plan is for the pt to return, as they are paying for a bed hold. CSW explained that the pt will need new insurance authorization to return.  CSW messaged Logan in Admissions to provide an update. The pt is able to return with approved authorization. ICM to follow.   Expected Discharge Plan: Skilled Nursing Facility Barriers to Discharge: Continued Medical Work up   Patient Goals and CMS Choice Patient states their goals for this hospitalization and ongoing recovery are:: SNF to get stonger          Expected Discharge Plan and Services       Living arrangements for the past 2 months: Single Family Home                                      Prior Living Arrangements/Services Living arrangements for the past 2 months: Single Family Home Lives with:: Self, Spouse Patient language and need for interpreter reviewed:: Yes Do you feel safe going back to the place where you live?: Yes      Need for Family Participation in Patient Care: Yes (Comment) Care giver support system in place?: Yes (comment)   Criminal Activity/Legal Involvement Pertinent to Current Situation/Hospitalization: No - Comment as needed  Activities of Daily Living   ADL Screening (condition at time of admission) Independently performs ADLs?: No Does the patient have a NEW difficulty with bathing/dressing/toileting/self-feeding that is expected to last >3 days?: No Does the patient have a NEW difficulty with getting in/out of bed, walking, or climbing stairs that is expected to last >3 days?:  No Does the patient have a NEW difficulty with communication that is expected to last >3 days?: No Is the patient deaf or have difficulty hearing?: Yes Does the patient have difficulty seeing, even when wearing glasses/contacts?: No Does the patient have difficulty concentrating, remembering, or making decisions?: Yes  Permission Sought/Granted                  Emotional Assessment Appearance:: Appears stated age Attitude/Demeanor/Rapport: Gracious Affect (typically observed): Accepting        Admission diagnosis:  Bladder hemorrhage [N32.89] Longstanding persistent atrial fibrillation (HCC) [I48.11] Recurrent gross hematuria [N02.9] Hypotension due to hypovolemia [E86.1] Patient Active Problem List   Diagnosis Date Noted   Arterial hypotension 06/29/2024   Bladder hemorrhage 06/27/2024   Paroxysmal atrial fibrillation with RVR (HCC) 06/27/2024   Proximal humerus fracture 06/27/2024   Foley catheter in place 06/27/2024   Normocytic anemia 06/27/2024   Leukocytosis 06/27/2024   Gross hematuria 06/18/2024   Acute delirium 05/28/2024   Anemia 05/28/2024   Pressure injury of skin 05/24/2024   Closed fracture of neck of left femur (HCC) 05/23/2024   Closed fracture of proximal end of left humerus 05/23/2024   Chronic anticoagulation 11/10/2019   Stage 3a chronic kidney disease (HCC) 11/03/2019   PAF (paroxysmal atrial fibrillation) (HCC) 10/19/2019   Essential hypertension 10/19/2019  Parkinson's disease (HCC) 10/19/2019   BPH (benign prostatic hyperplasia) 10/19/2019   Nausea 11/07/2017   Lower urinary tract symptoms due to benign prostatic hyperplasia 06/12/2017   Chronic obstructive pulmonary disease (HCC) 12/10/2016   Hyperlipidemia 12/10/2016   PCP:  Nichole Senior, MD Pharmacy:   [facility] [place], [place] - [phone] [address] [place] [address] Phone: [phone] Fax: [phone]     Social Drivers  of Health (SDOH) Social History: SDOH Screenings   Food Insecurity: No Food Insecurity (06/27/2024)  Housing: Low Risk (06/27/2024)  Transportation Needs: No Transportation Needs (06/27/2024)  Utilities: Not At Risk (06/27/2024)  Social Connections: Patient Unable To Answer (06/27/2024)  Recent Concern: Social Connections - Moderately Isolated (05/23/2024)  Tobacco Use: Medium Risk (06/27/2024)   SDOH Interventions:     Readmission Risk Interventions     No data to display

## 2024-06-30 NOTE — Progress Notes (Signed)
" °  Echocardiogram 2D Echocardiogram has been performed.  Devora Ellouise SAUNDERS 06/30/2024, 9:56 AM "

## 2024-06-30 NOTE — Plan of Care (Signed)

## 2024-06-30 NOTE — Progress Notes (Signed)
"  °  Progress Note  Patient Name: Michael Odonnell Date of Encounter: 06/30/2024 [facility] Cardiologist: Redell Shallow, MD   Interval Summary   Patient was sitting up in bed during interview.  His wife was present for the interview. Denies any chest pain or shortness of breath.  Urinary catheter appears clear.   Vital Signs Vitals:   06/29/24 1542 06/29/24 2014 06/30/24 0212 06/30/24 0553  BP:  (!) 157/89 (!) 171/73 (!) 178/74  Pulse:  69 77 81  Resp:  19 18 19   Temp:  98 F (36.7 C) 98.1 F (36.7 C) (!) 97.5 F (36.4 C)  TempSrc:  Oral Oral Oral  SpO2: 100% 99% 100% 97%  Weight:      Height:        Intake/Output Summary (Last 24 hours) at 06/30/2024 1157 Last data filed at 06/30/2024 0800 Gross per 24 hour  Intake 360 ml  Output 3650 ml  Net -3290 ml      06/27/2024    9:45 PM 06/27/2024    2:41 PM 06/18/2024    9:19 PM  Last 3 Weights  Weight (lbs) 151 lb 7.3 oz 163 lb 2.3 oz 163 lb 2.3 oz  Weight (kg) 68.7 kg 74 kg 74 kg      Telemetry/ECG  Normal sinus rhythm with heart rates in the 70s to 80s- Personally Reviewed  Physical Exam  GEN: No acute distress.   Neck: No JVD Cardiac: RRR, no murmurs, rubs, or gallops.  Respiratory: Clear to auscultation bilaterally. GI: Soft, nontender, non-distended  MS: No edema  Assessment & Plan  Is an 84 year old male with a prior history of Parkinson's, hypertension, atrial fibrillation, coronary calcifications, and LVH   Hematuria Patient was hospitalized for gross hematuria. On 06/18/2024 the patient had a suprapubic catheter placed by urology.  Urology was consulted and did a cystoscopy that showed clot retention and recent bulbar urethral trauma. Labs showed anemia with hemoglobin of 7.     Paroxysmal atrial fibrillation CHA2DS2-VASc Score = 4 [CHF History: 0, HTN History: 1, Diabetes History: 0, Stroke History: 0, Vascular Disease History: 1, Age Score: 2, Gender Score: 0].  Therefore, the patient's annual risk  of stroke is 4.8 %.    Because of the hematuria the patient's Eliquis  was held.  Because of concerns for hypotension the patient's calcium  channel blocker was stopped.  Patient's blood pressure has increase significantly over the past day.  He is currently in sinus rhythm on telemetry. Urology felt like the patient may restart his Eliquis  if he has no hematuria for 48 hours. The patient was felt to likely be a poor candidate for a watchman. -TTE showed an LVEF of 60-65%, G1 DD, normal RV systolic function, and grossly normal valve function. Restart Eliquis  on 07/01/2024 if urine remains clear with no further bleeding Start Cardizem  120 CD mg daily Scheduled outpatient f/u on 07/27/24   Hypertension Prior to admission patient was on lisinopril .  Patient's blood pressures recently have been elevated.  Most recent blood pressure was 184/74. Start Cardizem  as above May also consider restarting lisinopril  20 mg daily.    Otherwise management per primary.   Suspect [facility] will sign off.   Medication Recommendations:  as above  Follow up as an outpatient:  Scheduled outpatient f/u on 07/27/24 For questions or updates, please contact [facility] Please consult [email or web address] for contact info under        Signed, Morse Clause, PA-C   "

## 2024-06-30 NOTE — Progress Notes (Signed)
 [facility]  Michael Odonnell FMW:[id number]  DOB: 06-Feb-1941  DOA: 06/27/2024  PCP: Nichole Senior, MD  06/30/2024,1:06 PM  LOS: 1 day    Code Status:  Full code     From:  [name]  84 yr male Known history of chronic A-fib CHADVASC >4 on Eliquis  HTN Parkinson's disease ,?prior delirium  Admit 12/13--12/22 Hip # Dr. Georgina and repair by him-WBAT Left lower extremity--NWB left upper extremity and keep in sling readmitted 12/17 through 06/01/2024 with delirium in the setting of missing doses of Sinemet -and was [facility]--at that admission Eliquis  was held briefly and then resumed   1/8-1/15 readmit [facility] gross hematuria-he had decreased urine output and at the facility they tried to place Foley and then they found blood as well as clots and bloody output at the meatus--- there was found to be an obliterated urethral meatus without true lumen identified---1 urology place suprapubic 1/8 16 French and signed off 1/9 recommending 4-week exchange to allow for epithelialization and?  Ureteric construction  1/17 came back to ED with bright red bleeding around suprapubic in the setting of recent resumption of apixaban  and today after PT-Foley was not draining in the ER and Dr. Watt urology was consulted became tachycardic 180s hypotensive hemoglobin dropped from around 9--->7.5-treated with Kcentra  Found to also been sinus tach with A-fib RVR on arrival and started amiodarone  gtt. and cardiology consulted also Bedside attempt at Foley flushing was performed but this failed went to the OR for tube exchange and clot evacuation under sedation  -Went for emergent surgery after the Kcentra -and clot was removed and clot retention from probable urethral trauma was managed-underwent placement of a 22 French council catheter over wire into the bladder and balloon was filled with 20 mL-suprapubic tube was then also irrigated 1/19 transfuse 1 unit PRBC 1/20 echocardiogram EF 60-65%  grade 1 DD no significant changes from prior   Assessment  & Plan :    Urethral trauma with clot retention--- recent urethral trauma last admission with suprapubic catheter placement and retention this admission had urethral catheter 22 French placed-has suprapubic 16 French in place as well -SP tube plugged per urologist CBT continued-- Flomax  0.4 Myrbetriq  50 finasteride  5---urology continuing Foley catheter drainage and urethra for 2 weeks for urethral healing  BC x 2 neg-no role for antibiotics with just bleeding that is resolved Pain control Tylenol  650 every 6 as needed, oxycodone  IR 5 every 4 as needed moderate pain--BNO suppository every 6 as needed spasm,-Robaxin  held  SVT on admission?  Rapid A-fib in the setting of known chronic A-fib HfpEF--stable echo as above Cardiology consulted-amiodarone  discontinued 1/18 in setting of conversion to sinus hypotensive with some bradycardia into the 50s -Cardizem  completely held Cardiology/urology okay Eliquis  resumption 1/21 AM-resuming at 5 mg twice daily Holding lisinopril  20 at this time  Anemia of acute blood loss Hemoglobin 7 so transfusing 1 unit 1/19-hemoglobin has improved Giving IV iron  1/20  Parkinson's disease Continue Sinemet  2 tabs daily and 1 tab 3 times daily as per orders  Previous fecal impaction CT suggestive of stercoral colitis in addition to impaction so we will give enema and start MiraLAX  twice daily 17 mg-is having bowel movements  Recent fall with left hip fracture weightbearing as tolerated Nonweightbearing left upper extremity-proximal humerus fracture CT chest is suggestive of fluid collection 6 x 3 x 3 cm around proximal femoral diaphysis intramuscularly-outpatient follow-up Pain control as above-both PT OT requested to see in consult on patient and  likely can return to skilled facility Outpatient further follow-up as per Dr. Carloyn him-seems to have 07/20/2024 appointment which she can keep  Data  Reviewed today: Sodium 141 potassium 3.4 BUN/creatinine 9/0.77--heme 9.2, wbc 10.4, plt 351  DVT prophylaxis: SCD at this time  Dispo/Global plan:wife [name] updated [phone] at the bedside on 1/20 Await stability on DOAC and no bleed prior to being able to transfer him back out--expect end of week    Subjective:   Overall well--was a little bit dyspneic appearing to nursing so kept on oxygen--no n/v CP  Objective + exam Vitals:   06/29/24 1542 06/29/24 2014 06/30/24 0212 06/30/24 0553  BP:  (!) 157/89 (!) 171/73 (!) 178/74  Pulse:  69 77 81  Resp:  19 18 19   Temp:  98 F (36.7 C) 98.1 F (36.7 C) (!) 97.5 F (36.4 C)  TempSrc:  Oral Oral Oral  SpO2: 100% 99% 100% 97%  Weight:      Height:       Filed Weights   06/27/24 1441 06/27/24 2145  Weight: 74 kg 68.7 kg   Examination: EOMI NCAT looks well feels fair No icterus no pallor neck soft supple moderate in addition S1-S2 regular rate rhythm sinus shows sinus rhythm Abdomen soft no rebound no guarding ROM intact moving 4 limbs equally Power 5/5  Scheduled Meds:  bisacodyl   10 mg Oral Q0600   carbidopa -levodopa   2 tablet Oral Q0600   carbidopa -levodopa   1 tablet Oral TID   Chlorhexidine  Gluconate Cloth  6 each Topical Daily   diltiazem   120 mg Oral Daily   finasteride   5 mg Oral Daily   lisinopril   20 mg Oral Daily   magnesium  oxide  400 mg Oral QPM   mirabegron  ER  50 mg Oral q1600   mupirocin  ointment  1 Application Nasal BID   polyethylene glycol  17 g Oral Daily   saccharomyces boulardii  250 mg Oral BID   tamsulosin   0.4 mg Oral QHS   Continuous Infusions:  sodium chloride  irrigation Stopped (06/28/24 1622)   acetaminophen  **OR** acetaminophen , opium -belladonna, hydrALAZINE , mouth rinse, oxyCODONE   I spent 34 minutes today before, during and after this patient interview and examination--reviewing pertinent data, coordinating the patient's care and in communication with the care team and other medical  professionals  Colen Grimes, MD  [facility]

## 2024-06-30 NOTE — NC FL2 (Signed)
 " [place]  MEDICAID FL2 LEVEL OF CARE FORM     IDENTIFICATION  Patient Name: Michael Odonnell Birthdate: 21-Dec-1940 Sex: male Admission Date (Current Location): 06/27/2024  [facility] and [place] Number:  Producer, Television/film/video and Address:  [facility],  [address] [place], [place] [address]      Provider Number: [id number]  Attending Physician Name and Address:  Royal Sill, MD  Relative Name and Phone Number:  Spouse, Emergency Contact  [phone] [facility])    Current Level of Care: Hospital Recommended Level of Care: Skilled Nursing Facility Prior Approval Number:    Date Approved/Denied:   PASRR Number: [id number]  Discharge Plan: SNF    Current Diagnoses: Patient Active Problem List   Diagnosis Date Noted   Arterial hypotension 06/29/2024   Bladder hemorrhage 06/27/2024   Paroxysmal atrial fibrillation with RVR (HCC) 06/27/2024   Proximal humerus fracture 06/27/2024   Foley catheter in place 06/27/2024   Normocytic anemia 06/27/2024   Leukocytosis 06/27/2024   Gross hematuria 06/18/2024   Acute delirium 05/28/2024   Anemia 05/28/2024   Pressure injury of skin 05/24/2024   Closed fracture of neck of left femur (HCC) 05/23/2024   Closed fracture of proximal end of left humerus 05/23/2024   Chronic anticoagulation 11/10/2019   Stage 3a chronic kidney disease (HCC) 11/03/2019   PAF (paroxysmal atrial fibrillation) (HCC) 10/19/2019   Essential hypertension 10/19/2019   Parkinson's disease (HCC) 10/19/2019   BPH (benign prostatic hyperplasia) 10/19/2019   Nausea 11/07/2017   Lower urinary tract symptoms due to benign prostatic hyperplasia 06/12/2017   Chronic obstructive pulmonary disease (HCC) 12/10/2016   Hyperlipidemia 12/10/2016    Orientation RESPIRATION BLADDER Height & Weight     Self, Time, Situation, Place  O2 (2L) Incontinent Weight: 151 lb 7.3 oz (68.7 kg) Height:  5' 9 (175.3 cm)  BEHAVIORAL SYMPTOMS/MOOD NEUROLOGICAL BOWEL  NUTRITION STATUS      Continent Diet (heart)  AMBULATORY STATUS COMMUNICATION OF NEEDS Skin   Limited Assist Verbally PU Stage and Appropriate Care (sacrum)   PU Stage 2 Dressing: BID                   Personal Care Assistance Level of Assistance  Bathing, Feeding, Dressing Bathing Assistance: Limited assistance Feeding assistance: Independent Dressing Assistance: Limited assistance Total Care Assistance: Limited assistance   Functional Limitations Info  Sight, Hearing, Speech Sight Info: Adequate Hearing Info: Adequate Speech Info: Adequate    SPECIAL CARE FACTORS FREQUENCY  PT (By licensed PT), OT (By licensed OT)     PT Frequency: 5 x a week OT Frequency: 5 x  week            Contractures Contractures Info: Not present    Additional Factors Info  Code Status, Allergies Code Status Info: Full Allergies Info: Hydrochlorothiazide  Codeine  Doxycycline  Metronidazole   Mysoline (Primidone)           Current Medications (06/30/2024):  This is the current hospital active medication list Current Facility-Administered Medications  Medication Dose Route Frequency Provider Last Rate Last Admin   acetaminophen  (TYLENOL ) tablet 650 mg  650 mg Oral Q6H PRN Waddell Rake, MD       Or   acetaminophen  (TYLENOL ) suppository 650 mg  650 mg Rectal Q6H PRN Waddell Rake, MD       [START ON 07/01/2024] apixaban  (ELIQUIS ) tablet 5 mg  5 mg Oral BID Wofford, Drew A, RPH       belladonna-opium  (B&O) suppository 16.2-60mg   1  suppository Rectal Q6H PRN Alto Isaiah CROME, NP   1 suppository at 06/28/24 2141   bisacodyl  (DULCOLAX) EC tablet 10 mg  10 mg Oral Q0600 Waddell Isaiah, MD   10 mg at 06/30/24 9383   carbidopa -levodopa  (SINEMET  IR) 25-100 MG per tablet immediate release 2 tablet  2 tablet Oral Q0600 Waddell Isaiah, MD   2 tablet at 06/30/24 9383   carbidopa -levodopa  (SINEMET  IR) 25-250 MG per tablet immediate release 1 tablet  1 tablet Oral TID Waddell Isaiah, MD   1 tablet at  06/30/24 1549   Chlorhexidine  Gluconate Cloth 2 % PADS 6 each  6 each Topical Daily Waddell Isaiah, MD   6 each at 06/30/24 1023   diltiazem  (CARDIZEM  CD) 24 hr capsule 120 mg  120 mg Oral Daily Adams, Zane, PA-C   120 mg at 06/30/24 1549   finasteride  (PROSCAR ) tablet 5 mg  5 mg Oral Daily Waddell Isaiah, MD   5 mg at 06/30/24 1016   hydrALAZINE  (APRESOLINE ) injection 10 mg  10 mg Intravenous Q6H PRN Alto Isaiah CROME, NP   10 mg at 06/30/24 9379   lisinopril  (ZESTRIL ) tablet 20 mg  20 mg Oral Daily Adams, Zane, PA-C       magnesium  oxide (MAG-OX) tablet 400 mg  400 mg Oral QPM Waddell Isaiah, MD   400 mg at 06/29/24 1747   mirabegron  ER (MYRBETRIQ ) tablet 50 mg  50 mg Oral q1600 Waddell Isaiah, MD   50 mg at 06/30/24 1548   mupirocin  ointment (BACTROBAN ) 2 % 1 Application  1 Application Nasal BID Samtani, Jai-Gurmukh, MD   1 Application at 06/30/24 1022   Oral care mouth rinse  15 mL Mouth Rinse PRN Waddell Isaiah, MD       oxyCODONE  (Oxy IR/ROXICODONE ) immediate release tablet 5 mg  5 mg Oral Q4H PRN Waddell Isaiah, MD   5 mg at 06/27/24 2222   polyethylene glycol (MIRALAX  / GLYCOLAX ) packet 17 g  17 g Oral Daily Waddell Isaiah, MD   17 g at 06/29/24 1031   saccharomyces boulardii (FLORASTOR) capsule 250 mg  250 mg Oral BID Waddell Isaiah, MD   250 mg at 06/30/24 1016   tamsulosin  (FLOMAX ) capsule 0.4 mg  0.4 mg Oral QHS Waddell Isaiah, MD   0.4 mg at 06/29/24 2239     Discharge Medications: Please see discharge summary for a list of discharge medications.  Relevant Imaging Results:  Relevant Lab Results:   Additional Information [id number]  Michael HERO Isatu Macinnes, LCSW     "

## 2024-07-01 DIAGNOSIS — N3289 Other specified disorders of bladder: Secondary | ICD-10-CM | POA: Diagnosis not present

## 2024-07-01 LAB — CBC WITH DIFFERENTIAL/PLATELET
Abs Immature Granulocytes: 0.07 K/uL (ref 0.00–0.07)
Basophils Absolute: 0.1 K/uL (ref 0.0–0.1)
Basophils Relative: 1 %
Eosinophils Absolute: 0.4 K/uL (ref 0.0–0.5)
Eosinophils Relative: 4 %
HCT: 26.9 % — ABNORMAL LOW (ref 39.0–52.0)
Hemoglobin: 8.7 g/dL — ABNORMAL LOW (ref 13.0–17.0)
Immature Granulocytes: 1 %
Lymphocytes Relative: 13 %
Lymphs Abs: 1.3 K/uL (ref 0.7–4.0)
MCH: 30.1 pg (ref 26.0–34.0)
MCHC: 32.3 g/dL (ref 30.0–36.0)
MCV: 93.1 fL (ref 80.0–100.0)
Monocytes Absolute: 1.1 K/uL — ABNORMAL HIGH (ref 0.1–1.0)
Monocytes Relative: 11 %
Neutro Abs: 7.3 K/uL (ref 1.7–7.7)
Neutrophils Relative %: 70 %
Platelets: 338 K/uL (ref 150–400)
RBC: 2.89 MIL/uL — ABNORMAL LOW (ref 4.22–5.81)
RDW: 15.1 % (ref 11.5–15.5)
WBC: 10.3 K/uL (ref 4.0–10.5)
nRBC: 0 % (ref 0.0–0.2)

## 2024-07-01 LAB — BASIC METABOLIC PANEL WITH GFR
Anion gap: 8 (ref 5–15)
BUN: 11 mg/dL (ref 8–23)
CO2: 27 mmol/L (ref 22–32)
Calcium: 8.8 mg/dL — ABNORMAL LOW (ref 8.9–10.3)
Chloride: 106 mmol/L (ref 98–111)
Creatinine, Ser: 0.81 mg/dL (ref 0.61–1.24)
GFR, Estimated: >60 mL/min
Glucose, Bld: 140 mg/dL — ABNORMAL HIGH (ref 70–99)
Potassium: 3.4 mmol/L — ABNORMAL LOW (ref 3.5–5.1)
Sodium: 141 mmol/L (ref 135–145)

## 2024-07-01 MED ORDER — POTASSIUM CHLORIDE CRYS ER 20 MEQ PO TBCR
40.0000 meq | EXTENDED_RELEASE_TABLET | Freq: Once | ORAL | Status: AC
  Administered 2024-07-01: 40 meq via ORAL
  Filled 2024-07-01: qty 2

## 2024-07-01 NOTE — Progress Notes (Signed)
 " PROGRESS NOTE    Michael Odonnell  FMW:[id number] DOB: 1940-08-26 DOA: 06/27/2024 PCP: Nichole Senior, MD     Brief Narrative:  Michael Odonnell is an 84 year old male with past medical history significant for chronic A-fib on Eliquis , hypertension, Parkinson's disease.  He was recently hospitalized in December 2025 secondary to left hip fracture, left upper extremity fracture.  Readmitted in late December 2025 with delirium in setting of missed doses of Sinemet .  He was readmitted earlier in January 2026 secondary to gross hematuria.  At that time, suprapubic catheter was placed.  Return back to the emergency department 06/27/2024 insetting of bleeding around suprapubic catheter site after resuming Eliquis .  He was found to be tachycardic, hypotensive with drop in hemoglobin.  He was treated with Kcentra , amiodarone  drip.  Cardiology and urology was consulted.  Patient underwent surgery, clot removed, underwent placement of coud Foley catheter, suprapubic tube also irrigated.  Received blood transfusion.  New events last 24 hours / Subjective: Patient very fatigued.  Foley catheter without bleeding noted.  Eliquis  resumed today.  Awaiting SNF authorization.  Assessment & Plan:   Principal Problem:   Bladder hemorrhage Active Problems:   Paroxysmal atrial fibrillation with RVR (HCC)   Leukocytosis   Normocytic anemia   Essential hypertension   BPH (benign prostatic hyperplasia)   Chronic obstructive pulmonary disease (HCC)   Foley catheter in place   Parkinson's disease (HCC)   Proximal humerus fracture   Stage 3a chronic kidney disease (HCC)   Hyperlipidemia   Arterial hypotension   Urethral trauma with clot retention - Now has suprapubic catheter, which is plugged per urology - Continue Foley catheter drainage for urethral healing for 2 weeks - Follow-up with urology outpatient  SVT/rapid A-fib - Cardiology consulted -follow-up outpatient 07/27/24  - Amiodarone  discontinued.  Now  converted to sinus - Cardizem   - Eliquis  resumed 1/21  Hypertension - Lisinopril   Acute blood loss anemia - Status post 1 unit packed red blood cell transfusion  Parkinson's disease - Sinemet   History of fecal impaction - Bowel regimen  Recent fall with left hip fracture, left proximal humerus fracture - Follow-up with Dr. Georgina outpatient 07/20/24   Hypokalemia - Replace    In agreement with assessment of the pressure ulcer as below:  Wound 05/23/24 1652 Pressure Injury Sacrum Upper Stage 2 -  Partial thickness loss of dermis presenting as a shallow open injury with a red, pink wound bed without slough. (Active)         DVT prophylaxis:  Place TED hose Start: 06/27/24 1831 apixaban  (ELIQUIS ) tablet 5 mg  Code Status: Full code Family Communication: Spouse at bedside Disposition Plan: SNF Status is: Inpatient Remains inpatient appropriate because: SNF placement pending    Antimicrobials:  Anti-infectives (From admission, onward)    Start     Dose/Rate Route Frequency Ordered Stop   06/27/24 1600  ceFEPIme  (MAXIPIME ) 2 g in sodium chloride  0.9 % 100 mL IVPB        2 g 200 mL/hr over 30 Minutes Intravenous  Once 06/27/24 1558 06/27/24 1629   06/27/24 1600  metroNIDAZOLE  (FLAGYL ) IVPB 500 mg        500 mg 100 mL/hr over 60 Minutes Intravenous  Once 06/27/24 1558 06/27/24 1724   06/27/24 1600  vancomycin  (VANCOCIN ) IVPB 1000 mg/200 mL premix  Status:  Discontinued        1,000 mg 200 mL/hr over 60 Minutes Intravenous  Once 06/27/24 1558 06/27/24 1559   06/27/24 1600  vancomycin  (  VANCOREADY) IVPB 1500 mg/300 mL  Status:  Discontinued        1,500 mg 150 mL/hr over 120 Minutes Intravenous  Once 06/27/24 1559 06/27/24 1738        Objective: Vitals:   06/30/24 2047 07/01/24 0443 07/01/24 1122 07/01/24 1422  BP: (!) 148/65 (!) 123/58 (!) 119/49 (!) 136/59  Pulse: 75 69 71 73  Resp: 18 19  18   Temp: 97.8 F (36.6 C) 98.2 F (36.8 C)    TempSrc: Oral Oral     SpO2: 96% 94%  97%  Weight:      Height:        Intake/Output Summary (Last 24 hours) at 07/01/2024 1459 Last data filed at 07/01/2024 0622 Gross per 24 hour  Intake 600 ml  Output 700 ml  Net -100 ml   Filed Weights   06/27/24 1441 06/27/24 2145  Weight: 74 kg 68.7 kg    Examination:  General exam: Appears calm and comfortable  Respiratory system: Clear to auscultation. Respiratory effort normal. No respiratory distress. No conversational dyspnea.  Cardiovascular system: S1 & S2 heard. No murmurs. No pedal edema. Gastrointestinal system: Abdomen is nondistended, soft and nontender. Normal bowel sounds heard. Central nervous system: Alert and oriented  Data Reviewed: I have personally reviewed following labs and imaging studies  CBC: Recent Labs  Lab 06/27/24 1600 06/27/24 1616 06/27/24 2217 06/28/24 0307 06/29/24 0249 06/30/24 0401 07/01/24 0412  WBC 12.4*  --  17.8* 13.0* 12.1* 10.4 10.3  NEUTROABS 9.8*  --  16.2*  --  10.0* 7.4 7.3  HGB 9.2*   < > 7.8* 7.5* 7.0* 9.2* 8.7*  HCT 29.7*   < > 24.9* 23.7* 22.1* 27.5* 26.9*  MCV 94.6  --  95.4 93.7 93.6 90.2 93.1  PLT 357  --  275 272 295 351 338   < > = values in this interval not displayed.   Basic Metabolic Panel: Recent Labs  Lab 06/27/24 1600 06/27/24 1616 06/28/24 0307 06/29/24 0249 06/30/24 0401 07/01/24 0412  NA 139 138 138 140 141 141  K 4.2 4.0 4.3 3.9 3.4* 3.4*  CL 104 101 106 107 105 106  CO2 25  --  24 24 27 27   GLUCOSE 135* 125* 197* 141* 109* 140*  BUN 18 19 16 12 9 11   CREATININE 1.04 1.20 0.82 0.84 0.77 0.81  CALCIUM  9.3  --  8.4* 8.8* 9.2 8.8*   GFR: Estimated Creatinine Clearance: 64.8 mL/min (by C-G formula based on SCr of 0.81 mg/dL). Liver Function Tests: Recent Labs  Lab 06/27/24 1600 06/28/24 0307  AST 19 15  ALT <5 <5  ALKPHOS 154* 126  BILITOT 0.5 0.2  PROT 6.3* 5.4*  ALBUMIN  3.6 3.1*   No results for input(s): LIPASE, AMYLASE in the last 168 hours. No results for  input(s): AMMONIA in the last 168 hours. Coagulation Profile: Recent Labs  Lab 06/27/24 1600 06/28/24 0307  INR 1.3* 1.2   Cardiac Enzymes: No results for input(s): CKTOTAL, CKMB, CKMBINDEX, TROPONINI in the last 168 hours. BNP (last 3 results) No results for input(s): PROBNP in the last 8760 hours. HbA1C: No results for input(s): HGBA1C in the last 72 hours. CBG: No results for input(s): GLUCAP in the last 168 hours. Lipid Profile: No results for input(s): CHOL, HDL, LDLCALC, TRIG, CHOLHDL, LDLDIRECT in the last 72 hours. Thyroid  Function Tests: No results for input(s): TSH, T4TOTAL, FREET4, T3FREE, THYROIDAB in the last 72 hours. Anemia Panel: Recent Labs    06/29/24  0249  VITAMINB12 1,064*  FOLATE 13.0  FERRITIN 159  TIBC 238*  IRON  29*  RETICCTPCT 2.8   Sepsis Labs: Recent Labs  Lab 06/27/24 1615 06/27/24 1619 06/28/24 0307  PROCALCITON  --  0.49 0.34  LATICACIDVEN 1.4  --   --     Recent Results (from the past 240 hours)  Blood culture (routine x 2)     Status: None (Preliminary result)   Collection Time: 06/27/24  4:00 PM   Specimen: BLOOD  Result Value Ref Range Status   Specimen Description   Final    BLOOD LEFT ANTECUBITAL Performed at [facility], [address], [place], [place] [address]    Special Requests   Final    BOTTLES DRAWN AEROBIC AND ANAEROBIC Blood Culture adequate volume Performed at [facility], [address] [place], [place] [address]    Culture   Final    NO GROWTH 4 DAYS Performed at [facility], [address] [place], [place] [address]    Report Status PENDING  Incomplete  MRSA Next Gen by PCR, Nasal     Status: Abnormal   Collection Time: 06/27/24  9:56 PM   Specimen: Nasal Mucosa; Nasal Swab  Result Value Ref Range Status   MRSA by PCR Next Gen DETECTED (A) NOT DETECTED Final    Comment: CRITICAL RESULT CALLED TO, READ BACK BY AND  VERIFIED WITH: ARLOA BEAL RN AT [phone] 06/28/2024 BY JEREMY C (NOTE) The GeneXpert MRSA Assay (FDA approved for NASAL specimens only), is one component of a comprehensive MRSA colonization surveillance program. It is not intended to diagnose MRSA infection nor to guide or monitor treatment for MRSA infections. Test performance is not FDA approved in patients less than 60 years old. Performed at [facility], [address], [place], [place] [address]   Blood culture (routine x 2)     Status: None (Preliminary result)   Collection Time: 06/27/24 10:17 PM   Specimen: BLOOD RIGHT HAND  Result Value Ref Range Status   Specimen Description   Final    BLOOD RIGHT HAND Performed at [facility], [address] [place], [place] [address]    Special Requests   Final    BOTTLES DRAWN AEROBIC ONLY Blood Culture adequate volume Performed at [facility], [address] [place], [place] [address]    Culture   Final    NO GROWTH 3 DAYS Performed at [facility], [address] [place], [place] [address]    Report Status PENDING  Incomplete      Radiology Studies: ECHOCARDIOGRAM COMPLETE Result Date: 06/30/2024    ECHOCARDIOGRAM REPORT   Patient Name:   EZARIAH NACE Date of Exam: 06/30/2024 Medical Rec #:  [id number]     Height:       69.0 in Accession #:    [id number]    Weight:       151.5 lb Date of Birth:  May 27, 1941     BSA:          1.836 m Patient Age:    83 years      BP:           178/74 mmHg Patient Gender: M             HR:           74 bpm. Exam Location:  Inpatient Procedure: 2D Echo, Cardiac Doppler and Color Doppler (Both Spectral and  Color            Flow Doppler were utilized during procedure). Indications:    I48.91* Unspeicified atrial fibrillation. Hypotension  History:        Patient has prior history of Echocardiogram examinations, most                 recent 10/19/2019. Abnormal ECG, COPD, Arrythmias:Atrial                  Fibrillation; Risk Factors:Hypertension and Dyslipidemia.  Sonographer:    Ellouise Mose RDCS Referring Phys: [phone] VINIE BROCKS HILTY  Sonographer Comments: Technically difficult study due to poor echo windows. Image acquisition challenging due to respiratory motion. Only medial off- axis apical images could be obtained. Parasternal view was very low. Syngo delay IMPRESSIONS  1. Left ventricular ejection fraction, by estimation, is 60 to 65%. The left ventricle has normal function. The left ventricle has no regional wall motion abnormalities. Left ventricular diastolic parameters are consistent with Grade I diastolic dysfunction (impaired relaxation).  2. Right ventricular systolic function is normal. The right ventricular size is moderately enlarged. There is normal pulmonary artery systolic pressure. The estimated right ventricular systolic pressure is 21.1 mmHg.  3. There is no evidence of cardiac tamponade.  4. The mitral valve is degenerative. Trivial mitral valve regurgitation. No evidence of mitral stenosis.  5. The aortic valve is tricuspid. Aortic valve regurgitation is not visualized. Aortic valve sclerosis is present, with no evidence of aortic valve stenosis.  6. The inferior vena cava is normal in size with greater than 50% respiratory variability, suggesting right atrial pressure of 3 mmHg. Comparison(s): No significant change from prior study. FINDINGS  Left Ventricle: Left ventricular ejection fraction, by estimation, is 60 to 65%. The left ventricle has normal function. The left ventricle has no regional wall motion abnormalities. The left ventricular internal cavity size was normal in size. There is  no left ventricular hypertrophy. Left ventricular diastolic parameters are consistent with Grade I diastolic dysfunction (impaired relaxation). Normal left ventricular filling pressure. Right Ventricle: The right ventricular size is moderately enlarged. No increase in right ventricular wall thickness. Right  ventricular systolic function is normal. There is normal pulmonary artery systolic pressure. The tricuspid regurgitant velocity is 2.13 m/s, and with an assumed right atrial pressure of 3 mmHg, the estimated right ventricular systolic pressure is 21.1 mmHg. Left Atrium: Left atrial size was normal in size. Right Atrium: Right atrial size was normal in size. Pericardium: Trivial pericardial effusion is present. There is no evidence of cardiac tamponade. Mitral Valve: The mitral valve is degenerative in appearance. Mild mitral annular calcification. Trivial mitral valve regurgitation. No evidence of mitral valve stenosis. Tricuspid Valve: The tricuspid valve is normal in structure. Tricuspid valve regurgitation is mild . No evidence of tricuspid stenosis. Aortic Valve: The aortic valve is tricuspid. Aortic valve regurgitation is not visualized. Aortic valve sclerosis is present, with no evidence of aortic valve stenosis. Pulmonic Valve: The pulmonic valve was not well visualized. Pulmonic valve regurgitation is not visualized. No evidence of pulmonic stenosis. Aorta: The aortic root and ascending aorta are structurally normal, with no evidence of dilitation. Venous: The right upper pulmonary vein is normal. The inferior vena cava is normal in size with greater than 50% respiratory variability, suggesting right atrial pressure of 3 mmHg. IAS/Shunts: No atrial level shunt detected by color flow Doppler.  LEFT VENTRICLE PLAX 2D LVIDd:         4.60 cm  Diastology LVIDs:         3.00 cm      LV e' medial:    9.35 cm/s LV PW:         0.90 cm      LV E/e' medial:  6.6 LV IVS:        0.80 cm      LV e' lateral:   13.50 cm/s LVOT diam:     2.30 cm      LV E/e' lateral: 4.6 LV SV:         72 LV SV Index:   39 LVOT Area:     4.15 cm LV IVRT:       102 msec  LV Volumes (MOD) LV vol d, MOD A2C: 97.9 ml LV vol d, MOD A4C: 105.0 ml LV vol s, MOD A2C: 32.5 ml LV vol s, MOD A4C: 43.2 ml LV SV MOD A2C:     65.4 ml LV SV MOD A4C:      105.0 ml LV SV MOD BP:      64.4 ml RIGHT VENTRICLE             IVC RV S prime:     15.40 cm/s  IVC diam: 1.40 cm TAPSE (M-mode): 1.6 cm                             PULMONARY VEINS                             Diastolic Velocity: 26.60 cm/s                             S/D Velocity:       1.70                             Systolic Velocity:  45.70 cm/s LEFT ATRIUM           Index        RIGHT ATRIUM           Index LA diam:      3.30 cm 1.80 cm/m   RA Area:     11.40 cm LA Vol (A2C): 14.9 ml 8.12 ml/m   RA Volume:   22.10 ml  12.04 ml/m LA Vol (A4C): 24.0 ml 13.07 ml/m  AORTIC VALVE LVOT Vmax:   91.80 cm/s LVOT Vmean:  56.900 cm/s LVOT VTI:    0.174 m  AORTA Ao Root diam: 3.60 cm Ao Asc diam:  3.60 cm MITRAL VALVE               TRICUSPID VALVE MV Area (PHT): 2.74 cm    TR Peak grad:   18.1 mmHg MV Decel Time: 277 msec    TR Vmax:        213.00 cm/s MV E velocity: 61.50 cm/s MV A velocity: 78.80 cm/s  SHUNTS MV E/A ratio:  0.78        Systemic VTI:  0.17 m                            Systemic Diam: 2.30 cm Jerel Croitoru MD Electronically signed by Jerel Balding MD Signature Date/Time: 06/30/2024/10:01:35 AM    Final       Scheduled  Meds:  apixaban   5 mg Oral BID   bisacodyl   10 mg Oral Q0600   carbidopa -levodopa   2 tablet Oral Q0600   carbidopa -levodopa   1 tablet Oral TID   Chlorhexidine  Gluconate Cloth  6 each Topical Daily   diltiazem   120 mg Oral Daily   finasteride   5 mg Oral Daily   lisinopril   20 mg Oral Daily   magnesium  oxide  400 mg Oral QPM   mirabegron  ER  50 mg Oral q1600   mupirocin  ointment  1 Application Nasal BID   polyethylene glycol  17 g Oral Daily   saccharomyces boulardii  250 mg Oral BID   tamsulosin   0.4 mg Oral QHS   Continuous Infusions:   LOS: 2 days   Time spent: 25 minutes   Delon Hoe, DO [facility] 07/01/2024, 2:59 PM   Available via Epic secure chat 7am-7pm After these hours, please refer to coverage provider listed on [email or web address]  "

## 2024-07-02 DIAGNOSIS — N3289 Other specified disorders of bladder: Secondary | ICD-10-CM | POA: Diagnosis not present

## 2024-07-02 LAB — CBC
HCT: 28.2 % — ABNORMAL LOW (ref 39.0–52.0)
Hemoglobin: 8.8 g/dL — ABNORMAL LOW (ref 13.0–17.0)
MCH: 29.5 pg (ref 26.0–34.0)
MCHC: 31.2 g/dL (ref 30.0–36.0)
MCV: 94.6 fL (ref 80.0–100.0)
Platelets: 358 K/uL (ref 150–400)
RBC: 2.98 MIL/uL — ABNORMAL LOW (ref 4.22–5.81)
RDW: 15.2 % (ref 11.5–15.5)
WBC: 13.5 K/uL — ABNORMAL HIGH (ref 4.0–10.5)
nRBC: 0 % (ref 0.0–0.2)

## 2024-07-02 LAB — BASIC METABOLIC PANEL WITH GFR
Anion gap: 6 (ref 5–15)
BUN: 16 mg/dL (ref 8–23)
CO2: 29 mmol/L (ref 22–32)
Calcium: 9 mg/dL (ref 8.9–10.3)
Chloride: 103 mmol/L (ref 98–111)
Creatinine, Ser: 0.78 mg/dL (ref 0.61–1.24)
GFR, Estimated: >60 mL/min
Glucose, Bld: 132 mg/dL — ABNORMAL HIGH (ref 70–99)
Potassium: 4.3 mmol/L (ref 3.5–5.1)
Sodium: 138 mmol/L (ref 135–145)

## 2024-07-02 LAB — CULTURE, BLOOD (ROUTINE X 2)
Culture: NO GROWTH
Special Requests: ADEQUATE

## 2024-07-02 LAB — MAGNESIUM: Magnesium: 2.2 mg/dL (ref 1.7–2.4)

## 2024-07-02 NOTE — Progress Notes (Signed)
 " PROGRESS NOTE    Michael Odonnell  FMW:[id number] DOB: [birth year] DOA: 06/27/2024 PCP: Nichole Senior, MD     Brief Narrative:  Michael Odonnell is an 84 year old male with past medical history significant for chronic A-fib on Eliquis , hypertension, Parkinson's disease.  He was recently hospitalized in December 2025 secondary to left hip fracture, left upper extremity fracture.  Readmitted in late December 2025 with delirium in setting of missed doses of Sinemet .  He was readmitted earlier in January 2026 secondary to gross hematuria.  At that time, suprapubic catheter was placed.  Return back to the emergency department 06/27/2024 insetting of bleeding around suprapubic catheter site after resuming Eliquis .  He was found to be tachycardic, hypotensive with drop in hemoglobin.  He was treated with Kcentra , amiodarone  drip.  Cardiology and urology was consulted.  Patient underwent surgery, clot removed, underwent placement of coud Foley catheter, suprapubic tube also irrigated.  Received blood transfusion.  New events last 24 hours / Subjective: Eliquis  was resumed yesterday.  Some blood around the meatus.  Foley catheter without any blood in the bag.  Assessment & Plan:   Principal Problem:   Bladder hemorrhage Active Problems:   Paroxysmal atrial fibrillation with RVR (HCC)   Leukocytosis   Normocytic anemia   Essential hypertension   BPH (benign prostatic hyperplasia)   Chronic obstructive pulmonary disease (HCC)   Foley catheter in place   Parkinson's disease (HCC)   Proximal humerus fracture   Stage 3a chronic kidney disease (HCC)   Hyperlipidemia   Arterial hypotension   Urethral trauma with clot retention - Now has suprapubic catheter, which is plugged per urology - Continue Foley catheter drainage for urethral healing for 2 weeks - Follow-up with urology outpatient  SVT/rapid A-fib - Cardiology consulted -follow-up outpatient 07/27/24  - Amiodarone  discontinued.  Now converted  to sinus - Cardizem   - Eliquis  resumed 1/21  Hypertension - Lisinopril   Acute blood loss anemia - Status post 1 unit packed red blood cell transfusion  Parkinson's disease - Sinemet   History of fecal impaction - Bowel regimen  Recent fall with left hip fracture, left proximal humerus fracture - Follow-up with Dr. Georgina outpatient 07/20/24    In agreement with assessment of the pressure ulcer as below:  Wound 05/23/24 1652 Pressure Injury Sacrum Upper Stage 2 -  Partial thickness loss of dermis presenting as a shallow open injury with a red, pink wound bed without slough. (Active)         DVT prophylaxis:  Place TED hose Start: 06/27/24 1831 apixaban  (ELIQUIS ) tablet 5 mg  Code Status: Full code Family Communication: Spouse at bedside  Disposition Plan: SNF Status is: Inpatient Remains inpatient appropriate because: SNF placement pending    Antimicrobials:  Anti-infectives (From admission, onward)    Start     Dose/Rate Route Frequency Ordered Stop   06/27/24 1600  ceFEPIme  (MAXIPIME ) 2 g in sodium chloride  0.9 % 100 mL IVPB        2 g 200 mL/hr over 30 Minutes Intravenous  Once 06/27/24 1558 06/27/24 1629   06/27/24 1600  metroNIDAZOLE  (FLAGYL ) IVPB 500 mg        500 mg 100 mL/hr over 60 Minutes Intravenous  Once 06/27/24 1558 06/27/24 1724   06/27/24 1600  vancomycin  (VANCOCIN ) IVPB 1000 mg/200 mL premix  Status:  Discontinued        1,000 mg 200 mL/hr over 60 Minutes Intravenous  Once 06/27/24 1558 06/27/24 1559   06/27/24 1600  vancomycin  (VANCOREADY) IVPB  1500 mg/300 mL  Status:  Discontinued        1,500 mg 150 mL/hr over 120 Minutes Intravenous  Once 06/27/24 1559 06/27/24 1738        Objective: Vitals:   07/01/24 2018 07/01/24 2204 07/02/24 0517 07/02/24 0735  BP: (!) 119/59 131/61 125/60 118/60  Pulse: 71 69 68 66  Resp: 17  19   Temp: 98.5 F (36.9 C)  98 F (36.7 C) 97.9 F (36.6 C)  TempSrc: Oral  Oral Oral  SpO2: 100%  98% 100%   Weight:      Height:        Intake/Output Summary (Last 24 hours) at 07/02/2024 1100 Last data filed at 07/02/2024 0854 Gross per 24 hour  Intake 1440 ml  Output 975 ml  Net 465 ml   Filed Weights   06/27/24 1441 06/27/24 2145  Weight: 74 kg 68.7 kg    Examination:  General exam: Appears calm and comfortable  Respiratory system: Respiratory effort normal. No respiratory distress. No conversational dyspnea.  Central nervous system: Alert and oriented  Data Reviewed: I have personally reviewed following labs and imaging studies  CBC: Recent Labs  Lab 06/27/24 1600 06/27/24 1616 06/27/24 2217 06/28/24 0307 06/29/24 0249 06/30/24 0401 07/01/24 0412 07/02/24 0418  WBC 12.4*  --  17.8* 13.0* 12.1* 10.4 10.3 13.5*  NEUTROABS 9.8*  --  16.2*  --  10.0* 7.4 7.3  --   HGB 9.2*   < > 7.8* 7.5* 7.0* 9.2* 8.7* 8.8*  HCT 29.7*   < > 24.9* 23.7* 22.1* 27.5* 26.9* 28.2*  MCV 94.6  --  95.4 93.7 93.6 90.2 93.1 94.6  PLT 357  --  275 272 295 351 338 358   < > = values in this interval not displayed.   Basic Metabolic Panel: Recent Labs  Lab 06/28/24 0307 06/29/24 0249 06/30/24 0401 07/01/24 0412 07/02/24 0418  NA 138 140 141 141 138  K 4.3 3.9 3.4* 3.4* 4.3  CL 106 107 105 106 103  CO2 24 24 27 27 29   GLUCOSE 197* 141* 109* 140* 132*  BUN 16 12 9 11 16   CREATININE 0.82 0.84 0.77 0.81 0.78  CALCIUM  8.4* 8.8* 9.2 8.8* 9.0  MG  --   --   --   --  2.2   GFR: Estimated Creatinine Clearance: 65.6 mL/min (by C-G formula based on SCr of 0.78 mg/dL). Liver Function Tests: Recent Labs  Lab 06/27/24 1600 06/28/24 0307  AST 19 15  ALT <5 <5  ALKPHOS 154* 126  BILITOT 0.5 0.2  PROT 6.3* 5.4*  ALBUMIN  3.6 3.1*   No results for input(s): LIPASE, AMYLASE in the last 168 hours. No results for input(s): AMMONIA in the last 168 hours. Coagulation Profile: Recent Labs  Lab 06/27/24 1600 06/28/24 0307  INR 1.3* 1.2   Cardiac Enzymes: No results for input(s):  CKTOTAL, CKMB, CKMBINDEX, TROPONINI in the last 168 hours. BNP (last 3 results) No results for input(s): PROBNP in the last 8760 hours. HbA1C: No results for input(s): HGBA1C in the last 72 hours. CBG: No results for input(s): GLUCAP in the last 168 hours. Lipid Profile: No results for input(s): CHOL, HDL, LDLCALC, TRIG, CHOLHDL, LDLDIRECT in the last 72 hours. Thyroid  Function Tests: No results for input(s): TSH, T4TOTAL, FREET4, T3FREE, THYROIDAB in the last 72 hours. Anemia Panel: No results for input(s): VITAMINB12, FOLATE, FERRITIN, TIBC, IRON , RETICCTPCT in the last 72 hours.  Sepsis Labs: Recent Labs  Lab 06/27/24  1615 06/27/24 1619 06/28/24 0307  PROCALCITON  --  0.49 0.34  LATICACIDVEN 1.4  --   --     Recent Results (from the past 240 hours)  Blood culture (routine x 2)     Status: None   Collection Time: 06/27/24  4:00 PM   Specimen: BLOOD  Result Value Ref Range Status   Specimen Description   Final    BLOOD LEFT ANTECUBITAL Performed at [facility], [address], [place], [place] [address]    Special Requests   Final    BOTTLES DRAWN AEROBIC AND ANAEROBIC Blood Culture adequate volume Performed at [facility], [address] [place], [place] [address]    Culture   Final    NO GROWTH 5 DAYS Performed at [facility], [address] [place], [place] [address]    Report Status 07/02/2024 FINAL  Final  MRSA Next Gen by PCR, Nasal     Status: Abnormal   Collection Time: 06/27/24  9:56 PM   Specimen: Nasal Mucosa; Nasal Swab  Result Value Ref Range Status   MRSA by PCR Next Gen DETECTED (A) NOT DETECTED Final    Comment: CRITICAL RESULT CALLED TO, READ BACK BY AND VERIFIED WITH: ARLOA BEAL RN AT [phone] 06/28/2024 BY JEREMY C (NOTE) The GeneXpert MRSA Assay (FDA approved for NASAL specimens only), is one component of a comprehensive MRSA colonization  surveillance program. It is not intended to diagnose MRSA infection nor to guide or monitor treatment for MRSA infections. Test performance is not FDA approved in patients less than 96 years old. Performed at [facility], [address], [place], [place] [address]   Blood culture (routine x 2)     Status: None (Preliminary result)   Collection Time: 06/27/24 10:17 PM   Specimen: BLOOD RIGHT HAND  Result Value Ref Range Status   Specimen Description   Final    BLOOD RIGHT HAND Performed at [facility], [address] [place], [place] [address]    Special Requests   Final    BOTTLES DRAWN AEROBIC ONLY Blood Culture adequate volume Performed at [facility], [address], [place], [place] [address]    Culture   Final    NO GROWTH 4 DAYS Performed at [facility], [address] [place], [place] [address]    Report Status PENDING  Incomplete      Radiology Studies: No results found.     Scheduled Meds:  apixaban   5 mg Oral BID   bisacodyl   10 mg Oral Q0600   carbidopa -levodopa   2 tablet Oral Q0600   carbidopa -levodopa   1 tablet Oral TID   Chlorhexidine  Gluconate Cloth  6 each Topical Daily   diltiazem   120 mg Oral Daily   finasteride   5 mg Oral Daily   lisinopril   20 mg Oral Daily   magnesium  oxide  400 mg Oral QPM   mirabegron  ER  50 mg Oral q1600   mupirocin  ointment  1 Application Nasal BID   polyethylene glycol  17 g Oral Daily   saccharomyces boulardii  250 mg Oral BID   tamsulosin   0.4 mg Oral QHS   Continuous Infusions:   LOS: 3 days   Time spent: 25 minutes   Delon Hoe, DO [facility] 07/02/2024, 11:00 AM   Available via Epic secure chat 7am-7pm After these hours, please refer to coverage provider listed on [email or web address]  "

## 2024-07-02 NOTE — TOC Progression Note (Signed)
 Transition of Care [facility]) - Progression Note    Patient Details  Name: Michael Odonnell MRN: [id number] Date of Birth: 16-Jun-1940  Transition of [facility]) CM/SW Contact  Tawni CHRISTELLA Eva, LCSW Phone Number: 07/02/2024, 4:37 PM  Clinical Narrative:     Pt's insurance authorization for SNF is pending. ICM to follow.   Expected Discharge Plan: Skilled Nursing Facility Barriers to Discharge: Continued Medical Work up               Expected Discharge Plan and Services       Living arrangements for the past 2 months: Single Family Home                                       Social Drivers of Health (SDOH) Interventions SDOH Screenings   Food Insecurity: No Food Insecurity (06/27/2024)  Housing: Low Risk (06/27/2024)  Transportation Needs: No Transportation Needs (06/27/2024)  Utilities: Not At Risk (06/27/2024)  Social Connections: Patient Unable To Answer (06/27/2024)  Recent Concern: Social Connections - Moderately Isolated (05/23/2024)  Tobacco Use: Medium Risk (06/27/2024)    Readmission Risk Interventions     No data to display

## 2024-07-02 NOTE — Progress Notes (Signed)
 Physical Therapy Treatment Patient Details Name: Michael Odonnell MRN: [id number] DOB: February 06, 1941 Today's Date: 07/02/2024   History of Present Illness Patient is an 84 yo male admitted to [facility] for bladder hemorrhage s/p cystoscopy with evacuation of clots and fulguration of bleeders on 06/27/24. No not he was just d/cd to [facility] for rehab from [facility] on 06/25/24. PMH:  rehab followin L hip fx (s/p pinning) and L humeral fx ( nonoperative) in December. degenerative lumbar disc disease, HTN, HLD,  PD, GERD, PAF on eliquis , COPD, CKD stage 3a    PT Comments  Max assist for supine to sit. Mod assist sit to stand. Pt transferred from bed to recliner with +2 min assist for balance with verbal cues to correct narrow base of support. Pt required  verbal cues to avoid weight bearing with LUE during activity. Pt puts forth good effort. He performed seated BUE/LE exercises which he tolerated well. Activity tolerance limited by fatigue.     If plan is discharge home, recommend the following: A lot of help with walking and/or transfers;A lot of help with bathing/dressing/bathroom;Assist for transportation;Help with stairs or ramp for entrance   Can travel by private vehicle     No  Equipment Recommendations  None recommended by PT    Recommendations for Other Services       Precautions / Restrictions Precautions Precautions: Fall Recall of Precautions/Restrictions: Impaired Precaution/Restrictions Comments: hx parkinson's Required Braces or Orthoses: Sling Restrictions Weight Bearing Restrictions Per Provider Order: Yes LUE Weight Bearing Per Provider Order: Non weight bearing LLE Weight Bearing Per Provider Order: Weight bearing as tolerated     Mobility  Bed Mobility Overal bed mobility: Needs Assistance Bed Mobility: Supine to Sit     Supine to sit: Max assist, HOB elevated     General bed mobility comments: pt able to assist with advancing BLEs off of bed, assist to raise trunk and  pivot hips to EOB    Transfers Overall transfer level: Needs assistance Equipment used: 2 person hand held assist Transfers: Sit to/from Stand, Bed to chair/wheelchair/BSC Sit to Stand: From elevated surface, +2 safety/equipment, +2 physical assistance, Min assist   Step pivot transfers: Mod assist, +2 physical assistance       General transfer comment: Pt took small, shuffling pivotal steps bed to recliner with R HHA. VCs to widen base of support.    Ambulation/Gait                   Stairs             Wheelchair Mobility     Tilt Bed    Modified Rankin (Stroke Patients Only)       Balance Overall balance assessment: Needs assistance, History of Falls Sitting-balance support: Single extremity supported, Feet supported Sitting balance-Leahy Scale: Fair Sitting balance - Comments: posterior lean in sitting without UE support; can maintain trunk in neutral with single UE and BLE support Postural control: Posterior lean Standing balance support: Single extremity supported, During functional activity Standing balance-Leahy Scale: Poor Standing balance comment: needs MIN A for static stand                            Communication Communication Communication: Impaired Factors Affecting Communication: Hearing impaired;Reduced clarity of speech  Cognition Arousal: Alert Behavior During Therapy: WFL for tasks assessed/performed   PT - Cognitive impairments: Safety/Judgement  PT - Cognition Comments: Cues for safety, sequencing, and not pushing with L UE Following commands: Impaired Following commands impaired: Only follows one step commands consistently, Follows one step commands with increased time    Cueing Cueing Techniques: Verbal cues, Gestural cues, Visual cues, Tactile cues  Exercises General Exercises - Lower Extremity Ankle Circles/Pumps: AROM, Both, 10 reps, Supine Long Arc Quad: AROM, Both, 10 reps,  Seated Hip Flexion/Marching: Both, Seated, 10 reps, AROM    General Comments        Pertinent Vitals/Pain Pain Assessment Faces Pain Scale: No hurt    Home Living                          Prior Function            PT Goals (current goals can now be found in the care plan section) Acute Rehab PT Goals Patient Stated Goal: be able to walk with a walker again PT Goal Formulation: With patient Time For Goal Achievement: 07/13/24 Potential to Achieve Goals: Good Progress towards PT goals: Progressing toward goals    Frequency    Min 2X/week      PT Plan      Co-evaluation              AM-PAC PT 6 Clicks Mobility   Outcome Measure  Help needed turning from your back to your side while in a flat bed without using bedrails?: A Lot Help needed moving from lying on your back to sitting on the side of a flat bed without using bedrails?: A Lot Help needed moving to and from a bed to a chair (including a wheelchair)?: A Lot Help needed standing up from a chair using your arms (e.g., wheelchair or bedside chair)?: A Lot Help needed to walk in hospital room?: Total Help needed climbing 3-5 steps with a railing? : Total 6 Click Score: 10    End of Session Equipment Utilized During Treatment: Gait belt Activity Tolerance: Patient tolerated treatment well Patient left: in chair;with chair alarm set;with call bell/phone within reach;with family/visitor present Nurse Communication: Mobility status PT Visit Diagnosis: Muscle weakness (generalized) (M62.81);History of falling (Z91.81);Difficulty in walking, not elsewhere classified (R26.2);Unsteadiness on feet (R26.81)     Time: 8671-8642 PT Time Calculation (min) (ACUTE ONLY): 29 min  Charges:    $Therapeutic Exercise: 8-22 mins $Therapeutic Activity: 8-22 mins PT General Charges $$ ACUTE PT VISIT: 1 Visit                     Sylvan Delon Copp PT 07/02/2024  Acute Rehabilitation  Services  Office [phone]

## 2024-07-02 NOTE — Plan of Care (Signed)

## 2024-07-03 DIAGNOSIS — N3289 Other specified disorders of bladder: Secondary | ICD-10-CM | POA: Diagnosis not present

## 2024-07-03 LAB — CBC
HCT: 28.1 % — ABNORMAL LOW (ref 39.0–52.0)
Hemoglobin: 9 g/dL — ABNORMAL LOW (ref 13.0–17.0)
MCH: 30.3 pg (ref 26.0–34.0)
MCHC: 32 g/dL (ref 30.0–36.0)
MCV: 94.6 fL (ref 80.0–100.0)
Platelets: 349 K/uL (ref 150–400)
RBC: 2.97 MIL/uL — ABNORMAL LOW (ref 4.22–5.81)
RDW: 15.2 % (ref 11.5–15.5)
WBC: 12.1 K/uL — ABNORMAL HIGH (ref 4.0–10.5)
nRBC: 0 % (ref 0.0–0.2)

## 2024-07-03 LAB — CULTURE, BLOOD (ROUTINE X 2)
Culture: NO GROWTH
Special Requests: ADEQUATE

## 2024-07-03 MED ORDER — DILTIAZEM HCL ER COATED BEADS 120 MG PO CP24: 120.0000 mg | ORAL_CAPSULE | Freq: Every day | ORAL | 0 refills | Status: AC

## 2024-07-03 MED ORDER — OXYCODONE HCL 5 MG PO TABS: 5.0000 mg | ORAL_TABLET | Freq: Four times a day (QID) | ORAL | 0 refills | Status: AC | PRN

## 2024-07-03 NOTE — Care Management Important Message (Signed)
 Important Message  Patient Details IM Letter given. Name: Michael Odonnell MRN: [id number] Date of Birth: 1940/11/27   Important Message Given:  Yes - Medicare IM     [name] 07/03/2024, 11:10 AM

## 2024-07-03 NOTE — Progress Notes (Signed)
 Physical Therapy Treatment Patient Details Name: Michael Odonnell MRN: [id number] DOB: 11/05/1940 Today's Date: 07/03/2024   History of Present Illness Patient is an 84 yo male admitted to [facility] for bladder hemorrhage s/p cystoscopy with evacuation of clots and fulguration of bleeders on 06/27/24. Pt had just d/cd to [facility] for rehab from [facility] on 06/25/24. PMH:  rehab followin L hip fx (s/p pinning) and L humeral fx ( nonoperative) in December. degenerative lumbar disc disease, HTN, HLD,  PD, GERD, PAF on eliquis , COPD, CKD stage 3a    PT Comments  Pt with gradual progress and good participation but continues to fatigue easily and needs assist of 2.  Tolerating exercises well with cues.  Vitals stable during session. Cont POC.  Patient will benefit from continued inpatient follow up therapy, <3 hours/day at d/c.    If plan is discharge home, recommend the following: A lot of help with walking and/or transfers;A lot of help with bathing/dressing/bathroom;Assist for transportation;Help with stairs or ramp for entrance   Can travel by private vehicle        Equipment Recommendations  None recommended by PT    Recommendations for Other Services       Precautions / Restrictions Precautions Precautions: Fall Required Braces or Orthoses: Sling Restrictions LUE Weight Bearing Per Provider Order: Non weight bearing LLE Weight Bearing Per Provider Order: Weight bearing as tolerated     Mobility  Bed Mobility Overal bed mobility: Needs Assistance Bed Mobility: Supine to Sit     Supine to sit: Mod assist     General bed mobility comments: Max cues for sequencing; min A to get legs of bed and trunk up but then mod A to scoot as unable to use L UE    Transfers Overall transfer level: Needs assistance Equipment used: Hemi-walker Transfers: Sit to/from Stand Sit to Stand: Min assist, Mod assist, +2 safety/equipment           General transfer comment: STS x 2 during session -  initial with min A and cues but with fatigue needing mod A    Ambulation/Gait Ambulation/Gait assistance: Mod assist, +2 safety/equipment Gait Distance (Feet): 5 Feet   Gait Pattern/deviations: Step-to pattern, Decreased stride length, Trunk flexed, Shuffle Gait velocity: decreased     General Gait Details: Cues and assist for hemiwalker; cues to look upright; assist for balance; fatigues easily   Stairs             Wheelchair Mobility     Tilt Bed    Modified Rankin (Stroke Patients Only)       Balance Overall balance assessment: Needs assistance, History of Falls Sitting-balance support: Feet supported Sitting balance-Leahy Scale: Fair Sitting balance - Comments: Can sit EOB without UE support   Standing balance support: Single extremity supported, During functional activity Standing balance-Leahy Scale: Poor Standing balance comment: Hemiwalker and at least min A                            Communication Communication Factors Affecting Communication: Hearing impaired;Reduced clarity of speech  Cognition Arousal: Alert Behavior During Therapy: WFL for tasks assessed/performed   PT - Cognitive impairments: Safety/Judgement, Sequencing, Problem solving                       PT - Cognition Comments: Cues for safety, sequencing, and not pushing with L UE Following commands: Impaired Following commands impaired: Follows one step commands  with increased time    Cueing Cueing Techniques: Verbal cues, Gestural cues, Visual cues, Tactile cues  Exercises General Exercises - Lower Extremity Ankle Circles/Pumps: AROM, Both, 10 reps, Supine Heel Slides: Both, Supine, 10 reps, AROM Hip ABduction/ADduction: AROM, Both, 10 reps, Supine Other Exercises Other Exercises: Cues for correct form    General Comments General comments (skin integrity, edema, etc.): Pt had some fatigue and not verbally responding when standing and returned to sitting.  In  sitting reports that he was just tired and not lightheaded/syncopal.  Vitals were stable.      Pertinent Vitals/Pain Pain Assessment Pain Assessment: No/denies pain    Home Living                          Prior Function            PT Goals (current goals can now be found in the care plan section) Progress towards PT goals: Progressing toward goals    Frequency    Min 2X/week      PT Plan      Co-evaluation PT/OT/SLP Co-Evaluation/Treatment: Yes Reason for Co-Treatment: Complexity of the patient's impairments (multi-system involvement);For patient/therapist safety PT goals addressed during session: Mobility/safety with mobility;Balance;Strengthening/ROM        AM-PAC PT 6 Clicks Mobility   Outcome Measure  Help needed turning from your back to your side while in a flat bed without using bedrails?: A Lot Help needed moving from lying on your back to sitting on the side of a flat bed without using bedrails?: A Lot Help needed moving to and from a bed to a chair (including a wheelchair)?: A Lot Help needed standing up from a chair using your arms (e.g., wheelchair or bedside chair)?: A Lot Help needed to walk in hospital room?: Total Help needed climbing 3-5 steps with a railing? : Total 6 Click Score: 10    End of Session Equipment Utilized During Treatment: Gait belt Activity Tolerance: Patient tolerated treatment well Patient left: in chair;with chair alarm set;with call bell/phone within reach;with family/visitor present Nurse Communication: Mobility status PT Visit Diagnosis: Muscle weakness (generalized) (M62.81);History of falling (Z91.81);Difficulty in walking, not elsewhere classified (R26.2);Unsteadiness on feet (R26.81)     Time: 8941-8876 PT Time Calculation (min) (ACUTE ONLY): 25 min  Charges:    $Therapeutic Activity: 8-22 mins PT General Charges $$ ACUTE PT VISIT: 1 Visit                     Benjiman, PT Acute Rehab [facility] Rehab [phone]    Benjiman VEAR Mulberry 07/03/2024, 12:36 PM

## 2024-07-03 NOTE — Progress Notes (Signed)
 Occupational Therapy Treatment Patient Details Name: Michael Odonnell MRN: [id number] DOB: 1940-06-16 Today's Date: 07/03/2024   History of present illness Patient is an 85 yo male admitted to [facility] for bladder hemorrhage s/p cystoscopy with evacuation of clots and fulguration of bleeders on 06/27/24. Pt had just d/cd to [facility] for rehab from [facility] on 06/25/24. PMH:  rehab followin L hip fx (s/p pinning) and L humeral fx ( nonoperative) in December. degenerative lumbar disc disease, HTN, HLD,  PD, GERD, PAF on eliquis , COPD, CKD stage 3a   OT comments  Patient seen in conjunction with PT in order to increase overall functional mobility and activity tolerance. Patient continues to require cues for NWB through LUE and increased assist in order to fully scoot to the EOB. Patient requiring eventual mod A to fully achieve sitting. Patient min A of 2 to stand with hemi-walker, taking a few steps to the sink for grooming. Patient all a sudden becoming quiet, and stating he was dizzy. Patient assisted into recliner, reporting fatigue but never feeling like he was going to pass out. BP assessed at 136/55 (77) in sitting. Patient unable to progress further due to fatigue. OT recommendation remains appropriate, will continue to follow acutely.       If plan is discharge home, recommend the following:  Two people to help with walking and/or transfers;Two people to help with bathing/dressing/bathroom;Assistance with feeding;Assist for transportation   Equipment Recommendations  Other (comment) (defer to next venue)    Recommendations for Other Services      Precautions / Restrictions Precautions Precautions: Fall Required Braces or Orthoses: Sling Restrictions LUE Weight Bearing Per Provider Order: Non weight bearing LLE Weight Bearing Per Provider Order: Weight bearing as tolerated       Mobility Bed Mobility Overal bed mobility: Needs Assistance Bed Mobility: Supine to Sit     Supine to sit:  Mod assist     General bed mobility comments: Max cues for sequencing; min A to get legs of bed and trunk up but then mod A to scoot as unable to use L UE    Transfers Overall transfer level: Needs assistance Equipment used: Hemi-walker Transfers: Sit to/from Stand Sit to Stand: Min assist, Mod assist, +2 safety/equipment           General transfer comment: STS x 2 during session - initial with min A and cues but with fatigue needing mod A     Balance Overall balance assessment: Needs assistance, History of Falls Sitting-balance support: Feet supported Sitting balance-Leahy Scale: Fair Sitting balance - Comments: Can sit EOB without UE support   Standing balance support: Single extremity supported, During functional activity Standing balance-Leahy Scale: Poor Standing balance comment: Hemiwalker and at least min A                           ADL either performed or assessed with clinical judgement   ADL Overall ADL's : Needs assistance/impaired     Grooming: Wash/dry hands;Wash/dry face;Set up;Sitting           Upper Body Dressing : Maximal assistance;Sitting       Toilet Transfer: Moderate assistance;+2 for physical assistance;+2 for safety/equipment;Stand-pivot;BSC/3in1 Toilet Transfer Details (indicate cue type and reason): simulated with transfer and ambulation to the recliner         Functional mobility during ADLs: Moderate assistance;+2 for physical assistance;+2 for safety/equipment;Cueing for sequencing;Cueing for safety General ADL Comments: Patient seen in conjunction with  PT in order to increase overall functional mobility and activity tolerance. Patient continues to require cues for NWB through LUE and increased assist in order to fully scoot to the EOB. Patient requiring eventual mod A to fully achieve sitting. Patient min A of 2 to stand with hemi-walker, taking a few steps to the sink for grooming. Patient all a sudden becoming quiet, and  stating he was dizzy. Patient assisted into recliner, reporting fatigue but never feeling like he was going to pass out. BP assessed at 136/55 (77) in sitting. Patient unable to progress further due to fatigue. OT recommendation remains appropriate, will continue to follow acutely.    Extremity/Trunk Assessment              Vision       Perception Perception Perception: Not tested   Praxis Praxis Praxis: Not tested   Communication Communication Factors Affecting Communication: Hearing impaired;Reduced clarity of speech   Cognition Arousal: Alert Behavior During Therapy: WFL for tasks assessed/performed Cognition: Cognition impaired     Awareness: Intellectual awareness impaired, Online awareness impaired Memory impairment (select all impairments): Working memory Attention impairment (select first level of impairment): Selective attention Executive functioning impairment (select all impairments): Organization, Sequencing, Reasoning, Problem solving OT - Cognition Comments: flat affect, requiring cues for all movement and to adhere to NWB precautions                 Following commands: Impaired Following commands impaired: Follows one step commands with increased time      Cueing   Cueing Techniques: Verbal cues, Gestural cues, Visual cues, Tactile cues  Exercises      Shoulder Instructions       General Comments Pt had some fatigue and not verbally responding when standing and returned to sitting.  In sitting reports that he was just tired and not lightheaded/syncopal.  Vitals were stable.    Pertinent Vitals/ Pain       Pain Assessment Pain Assessment: Faces Faces Pain Scale: No hurt Pain Intervention(s): Limited activity within patient's tolerance, Monitored during session, Repositioned  Home Living                                          Prior Functioning/Environment              Frequency  Min 2X/week        Progress  Toward Goals  OT Goals(current goals can now be found in the care plan section)  Progress towards OT goals: Progressing toward goals (incrementally)  Acute Rehab OT Goals Patient Stated Goal: to get better OT Goal Formulation: With patient/family Time For Goal Achievement: 07/13/24 Potential to Achieve Goals: Fair  Plan      Co-evaluation      Reason for Co-Treatment: Complexity of the patient's impairments (multi-system involvement);For patient/therapist safety PT goals addressed during session: Mobility/safety with mobility;Balance;Strengthening/ROM OT goals addressed during session: ADL's and self-care      AM-PAC OT 6 Clicks Daily Activity     Outcome Measure   Help from another person eating meals?: A Little Help from another person taking care of personal grooming?: A Little Help from another person toileting, which includes using toliet, bedpan, or urinal?: Total Help from another person bathing (including washing, rinsing, drying)?: A Lot Help from another person to put on and taking off regular upper body clothing?: A Lot Help from another  person to put on and taking off regular lower body clothing?: Total 6 Click Score: 12    End of Session Equipment Utilized During Treatment: Gait belt;Other (comment) (Hemi-walker)  OT Visit Diagnosis: Unsteadiness on feet (R26.81);Other abnormalities of gait and mobility (R26.89);Muscle weakness (generalized) (M62.81)   Activity Tolerance Patient limited by fatigue   Patient Left in chair;with call bell/phone within reach;with chair alarm set;with family/visitor present   Nurse Communication Mobility status        Time: 8897-8874 OT Time Calculation (min): 23 min  Charges: OT General Charges $OT Visit: 1 Visit OT Treatments $Self Care/Home Management : 8-22 mins  Ronal Gift E. Davari Lopes, OTR/L Acute Rehabilitation Services [phone]   Ronal Gift Salt 07/03/2024, 1:15 PM

## 2024-07-03 NOTE — Discharge Summary (Signed)
 Physician Discharge Summary  Michael Odonnell FMW:[id number] DOB: 1940/08/15 DOA: 06/27/2024  PCP: Michael Senior, MD  Admit date: 06/27/2024 Discharge date: 07/03/2024  Disposition:  [facility]   Recommendations for Outpatient Follow-up:  Follow up with PCP  Follow up with urology  Follow-up with orthopedic surgery Dr. Georgina as scheduled 07/20/2024 Follow-up with cardiology outpatient as scheduled 07/27/2024  Discharge Condition: Stable CODE STATUS: Full  Diet recommendation:  Diet Orders (From admission, onward)     Start     Ordered   06/27/24 2109  Diet Heart Room service appropriate? Yes; Fluid consistency: Thin  Diet effective now       Question Answer Comment  Room service appropriate? Yes   Fluid consistency: Thin      06/27/24 2108           Brief/Interim Summary: Michael Odonnell is an 84 year old male with past medical history significant for chronic A-fib on Eliquis , hypertension, Parkinson's disease.  He was recently hospitalized in December 2025 secondary to left hip fracture, left upper extremity fracture.  Readmitted in late December 2025 with delirium in setting of missed doses of Sinemet .  He was readmitted earlier in January 2026 secondary to gross hematuria.  At that time, suprapubic catheter was placed.  Return back to the emergency department 06/27/2024 insetting of bleeding around suprapubic catheter site after resuming Eliquis .  He was found to be tachycardic, hypotensive with drop in hemoglobin.  He was treated with Kcentra , amiodarone  drip.  Cardiology and urology was consulted.  Patient underwent surgery, clot removed, underwent placement of coud Foley catheter, suprapubic tube also irrigated.  Received blood transfusion.  Patient remained stable. He was restarted on Eliquis  on 07/01/24 without further evidence of hematuria. He was discharged in stable condition to SNF.   Discharge Diagnoses:   Principal Problem:   Bladder hemorrhage Active Problems:    Paroxysmal atrial fibrillation with RVR (HCC)   Leukocytosis   Normocytic anemia   Essential hypertension   BPH (benign prostatic hyperplasia)   Chronic obstructive pulmonary disease (HCC)   Foley catheter in place   Parkinson's disease (HCC)   Proximal humerus fracture   Stage 3a chronic kidney disease (HCC)   Hyperlipidemia   Arterial hypotension   Urethral trauma with clot retention - Now has suprapubic catheter, which is plugged per urology - Continue Foley catheter drainage for urethral healing for 2 weeks - Follow-up with urology outpatient   SVT/rapid A-fib - Cardiology consulted -follow-up outpatient 07/27/24  - Amiodarone  discontinued.  Now converted to sinus - Cardizem   - Eliquis  resumed 1/21   Hypertension - Lisinopril    Acute blood loss anemia - Status post 1 unit packed red blood cell transfusion   Parkinson's disease - Sinemet    History of fecal impaction - Bowel regimen   Recent fall with left hip fracture, left proximal humerus fracture - Follow-up with Dr. Georgina outpatient 07/20/24      In agreement with assessment of the pressure ulcer as below:  Wound 05/23/24 1652 Pressure Injury Sacrum Upper Stage 2 -  Partial thickness loss of dermis presenting as a shallow open injury with a red, pink wound bed without slough. (Active)       Discharge Instructions  Discharge Instructions     Increase activity slowly   Complete by: As directed    No wound care   Complete by: As directed       Allergies as of 07/03/2024       Reactions   Hydrochlorothiazide Nausea Only,  Palpitations   Codeine Nausea And Vomiting   Doxycycline Nausea Only   Metronidazole  Nausea Only   Mysoline [primidone] Other (See Comments)   Change in balance        Medication List     STOP taking these medications    diltiazem  360 MG 24 hr capsule Commonly known as: TIAZAC        TAKE these medications    carbidopa -levodopa  25-250 MG disintegrating  tablet Commonly known as: PARCOPA  Take 1 tablet by mouth 3 (three) times daily. The timing of this medication is very important.   carbidopa -levodopa  25-100 MG disintegrating tablet Commonly known as: PARCOPA  Take 2 tablets by mouth every morning. The timing of this medication is very important.   acetaminophen  500 MG tablet Commonly known as: TYLENOL  Take 1,000 mg by mouth every 6 (six) hours as needed for mild pain or headache.   bisacodyl  5 MG EC tablet Commonly known as: DULCOLAX Take 2 tablets (10 mg total) by mouth daily at 6 (six) AM.   calcium  carbonate 500 MG chewable tablet Commonly known as: TUMS - dosed in mg elemental calcium  Chew 2 tablets by mouth daily as needed for indigestion or heartburn.   CARRINGTON MOIST BARRIER/ZINC EX Apply 1 application  topically See admin instructions. Apply to buttocks after each episode of incontinence- after first cleansing and drying   Centrum Silver 50+Men Tabs Take 1 tablet by mouth at bedtime.   diltiazem  120 MG 24 hr capsule Commonly known as: CARDIZEM  CD Take 1 capsule (120 mg total) by mouth daily. Start taking on: July 04, 2024   Eliquis  5 MG Tabs tablet Generic drug: apixaban  TAKE 1 TABLET BY MOUTH TWICE  DAILY What changed: when to take this   finasteride  5 MG tablet Commonly known as: PROSCAR  Take 5 mg by mouth daily.   lisinopril  20 MG tablet Commonly known as: ZESTRIL  Take 20 mg by mouth daily.   magnesium  oxide 400 (240 Mg) MG tablet Commonly known as: MAG-OX Take 400 mg by mouth daily in the afternoon.   methocarbamol  500 MG tablet Commonly known as: ROBAXIN  Take 1 tablet (500 mg total) by mouth every 6 (six) hours as needed for muscle spasms.   Myrbetriq  50 MG Tb24 tablet Generic drug: mirabegron  ER Take 50 mg by mouth daily at 4 PM.   niacin  500 MG tablet Commonly known as: VITAMIN B3 Take 500 mg by mouth every 12 (twelve) hours.   Nutritional Drink Liqd Take 120 mLs by mouth See admin  instructions. MedPass - Drink 120 ml's by mouth 2 times a day   oxyCODONE  5 MG immediate release tablet Commonly known as: Oxy IR/ROXICODONE  Take 1 tablet (5 mg total) by mouth every 6 (six) hours as needed for moderate pain (pain score 4-6) or severe pain (pain score 7-10). What changed: when to take this   polyethylene glycol powder 17 GM/SCOOP powder Commonly known as: GLYCOLAX /MIRALAX  Take 17 g by mouth daily. Dissolve 1 capful (17g) in 4-8 ounces of liquid and take by mouth daily.   saccharomyces boulardii 250 MG capsule Commonly known as: FLORASTOR Take 250 mg by mouth 2 (two) times daily.   tamsulosin  0.4 MG Caps capsule Commonly known as: Flomax  Take 1 capsule (0.4 mg total) by mouth daily.        Follow-up Information     Michael Senior, MD Follow up.   Specialty: Endocrinology Contact information: [address] [place] [address] [phone]         [facility]. Schedule an appointment as soon as possible for a visit.   Contact information: [address] [place]  [address] [phone]        Michael Odonnell Ozell LABOR, MD. Go on 07/20/2024.   Specialty: Orthopedic Surgery Contact information: [address] [place] [address] [phone]         Vicci Rollo SAUNDERS, PA-C. Go on 07/27/2024.   Specialty: Cardiology Contact information: [address] [place] [address] [phone]                Allergies[1]   Procedures/Studies: ECHOCARDIOGRAM COMPLETE Result Date: 06/30/2024    ECHOCARDIOGRAM REPORT   Patient Name:   Michael Odonnell Date of Exam: 06/30/2024 Medical Rec #:  [id number]     Height:       69.0 in Accession #:    [id number]    Weight:       151.5 lb Date of Birth:  1940/12/11     BSA:          1.836 m Patient Age:    83 years      BP:           178/74 mmHg Patient Gender: M             HR:           74 bpm. Exam Location:  Inpatient Procedure: 2D Echo, Cardiac Doppler and Color  Doppler (Both Spectral and Color            Flow Doppler were utilized during procedure). Indications:    I48.91* Unspeicified atrial fibrillation. Hypotension  History:        Patient has prior history of Echocardiogram examinations, most                 recent 10/19/2019. Abnormal ECG, COPD, Arrythmias:Atrial                 Fibrillation; Risk Factors:Hypertension and Dyslipidemia.  Sonographer:    Ellouise Mose RDCS Referring Phys: [phone] VINIE BROCKS HILTY  Sonographer Comments: Technically difficult study due to poor echo windows. Image acquisition challenging due to respiratory motion. Only medial off- axis apical images could be obtained. Parasternal view was very low. Syngo delay IMPRESSIONS  1. Left ventricular ejection fraction, by estimation, is 60 to 65%. The left ventricle has normal function. The left ventricle has no regional wall motion abnormalities. Left ventricular diastolic parameters are consistent with Grade I diastolic dysfunction (impaired relaxation).  2. Right ventricular systolic function is normal. The right ventricular size is moderately enlarged. There is normal pulmonary artery systolic pressure. The estimated right ventricular systolic pressure is 21.1 mmHg.  3. There is no evidence of cardiac tamponade.  4. The mitral valve is degenerative. Trivial mitral valve regurgitation. No evidence of mitral stenosis.  5. The aortic valve is tricuspid. Aortic valve regurgitation is not visualized. Aortic valve sclerosis is present, with no evidence of aortic valve stenosis.  6. The inferior vena cava is normal in size with greater than 50% respiratory variability, suggesting right atrial pressure of 3 mmHg. Comparison(s): No significant change from prior study. FINDINGS  Left Ventricle: Left ventricular ejection fraction, by estimation, is 60 to 65%. The left ventricle has normal function. The left ventricle has no regional wall motion abnormalities. The left ventricular internal cavity size was normal  in size. There is  no left ventricular hypertrophy. Left ventricular diastolic parameters are consistent with Grade I diastolic dysfunction (impaired relaxation). Normal left ventricular filling  pressure. Right Ventricle: The right ventricular size is moderately enlarged. No increase in right ventricular wall thickness. Right ventricular systolic function is normal. There is normal pulmonary artery systolic pressure. The tricuspid regurgitant velocity is 2.13 m/s, and with an assumed right atrial pressure of 3 mmHg, the estimated right ventricular systolic pressure is 21.1 mmHg. Left Atrium: Left atrial size was normal in size. Right Atrium: Right atrial size was normal in size. Pericardium: Trivial pericardial effusion is present. There is no evidence of cardiac tamponade. Mitral Valve: The mitral valve is degenerative in appearance. Mild mitral annular calcification. Trivial mitral valve regurgitation. No evidence of mitral valve stenosis. Tricuspid Valve: The tricuspid valve is normal in structure. Tricuspid valve regurgitation is mild . No evidence of tricuspid stenosis. Aortic Valve: The aortic valve is tricuspid. Aortic valve regurgitation is not visualized. Aortic valve sclerosis is present, with no evidence of aortic valve stenosis. Pulmonic Valve: The pulmonic valve was not well visualized. Pulmonic valve regurgitation is not visualized. No evidence of pulmonic stenosis. Aorta: The aortic root and ascending aorta are structurally normal, with no evidence of dilitation. Venous: The right upper pulmonary vein is normal. The inferior vena cava is normal in size with greater than 50% respiratory variability, suggesting right atrial pressure of 3 mmHg. IAS/Shunts: No atrial level shunt detected by color flow Doppler.  LEFT VENTRICLE PLAX 2D LVIDd:         4.60 cm      Diastology LVIDs:         3.00 cm      LV e' medial:    9.35 cm/s LV PW:         0.90 cm      LV E/e' medial:  6.6 LV IVS:        0.80 cm      LV  e' lateral:   13.50 cm/s LVOT diam:     2.30 cm      LV E/e' lateral: 4.6 LV SV:         72 LV SV Index:   39 LVOT Area:     4.15 cm LV IVRT:       102 msec  LV Volumes (MOD) LV vol d, MOD A2C: 97.9 ml LV vol d, MOD A4C: 105.0 ml LV vol s, MOD A2C: 32.5 ml LV vol s, MOD A4C: 43.2 ml LV SV MOD A2C:     65.4 ml LV SV MOD A4C:     105.0 ml LV SV MOD BP:      64.4 ml RIGHT VENTRICLE             IVC RV S prime:     15.40 cm/s  IVC diam: 1.40 cm TAPSE (M-mode): 1.6 cm                             PULMONARY VEINS                             Diastolic Velocity: 26.60 cm/s                             S/D Velocity:       1.70                             Systolic Velocity:  45.70  cm/s LEFT ATRIUM           Index        RIGHT ATRIUM           Index LA diam:      3.30 cm 1.80 cm/m   RA Area:     11.40 cm LA Vol (A2C): 14.9 ml 8.12 ml/m   RA Volume:   22.10 ml  12.04 ml/m LA Vol (A4C): 24.0 ml 13.07 ml/m  AORTIC VALVE LVOT Vmax:   91.80 cm/s LVOT Vmean:  56.900 cm/s LVOT VTI:    0.174 m  AORTA Ao Root diam: 3.60 cm Ao Asc diam:  3.60 cm MITRAL VALVE               TRICUSPID VALVE MV Area (PHT): 2.74 cm    TR Peak grad:   18.1 mmHg MV Decel Time: 277 msec    TR Vmax:        213.00 cm/s MV E velocity: 61.50 cm/s MV A velocity: 78.80 cm/s  SHUNTS MV E/A ratio:  0.78        Systemic VTI:  0.17 m                            Systemic Diam: 2.30 cm Jerel Croitoru MD Electronically signed by Jerel Balding MD Signature Date/Time: 06/30/2024/10:01:35 AM    Final    US  Pelvis Limited Result Date: 06/27/2024 EXAM: PELVIC ULTRASOUND 06/27/2024 04:03:30 PM TECHNIQUE: Transabdominal pelvic duplex ultrasound using B-mode/gray scaled imaging with Doppler spectral analysis and color flow was obtained. COMPARISON: CT abdomen and pelvis 06/18/2024. CLINICAL HISTORY: assess for urinary retention/clot burden in bladder. FINDINGS: ULTRASOUND FINDINGS: UTERUS: Not applicable as the patient is male. ENDOMETRIAL STRIPE: Not applicable as the  patient is male. RIGHT OVARY: Not applicable as the patient is male. LEFT OVARY: Not applicable as the patient is male. BLADDER: Foley catheter in the bladder. There is a moderate to large amount of heterogeneous nonvascular echogenic material within the bladder surrounding the foley catheter, possibly related to hemorrhage. FREE FLUID: No free fluid. IMPRESSION: 1. Moderate to large amount of heterogeneous nonvascular echogenic material within the urinary bladder surrounding the Foley catheter, compatible with intravesical blood clot/hemorrhage. Electronically signed by: Greig Pique MD 06/27/2024 04:26 PM EST RP Workstation: [id number]   DG Abd Portable 1V Result Date: 06/20/2024 CLINICAL DATA:  Decreased bowel movements EXAM: PORTABLE ABDOMEN - 1 VIEW COMPARISON:  CT 06/18/2024 FINDINGS: Single supine view of the abdomen and pelvis. Left proximal femur fixation. Gas-filled large and small bowel, without obstruction. Large amount of stool in the rectum and sigmoid. No gross free intraperitoneal air on this supine image. Probable phleboliths in the pelvis. IMPRESSION: Large distal colonic stool burden, suggesting fecal impaction. No bowel obstruction. Electronically Signed   By: Rockey Kilts M.D.   On: 06/20/2024 17:46   CT ABDOMEN PELVIS W CONTRAST Result Date: 06/18/2024 EXAM: CT ABDOMEN AND PELVIS WITH CONTRAST 06/18/2024 04:59:54 PM TECHNIQUE: CT of the abdomen and pelvis was performed with the administration of 100 mL of iohexol  (OMNIPAQUE ) 300 MG/ML solution. Multiplanar reformatted images are provided for review. Automated exposure control, iterative reconstruction, and/or weight-based adjustment of the mA/kV was utilized to reduce the radiation dose to as low as reasonably achievable. COMPARISON: CT abdomen 12/12/2023. CLINICAL HISTORY: Hematuria, gross/macroscopic. FINDINGS: LOWER CHEST: There is a calcified granuloma in the right lung base. There is linear atelectasis or scarring in both lung bases.  LIVER:  There are a few subcentimeter hypodensities in the liver which are unchanged, likely cysts or hemangiomas. GALLBLADDER AND BILE DUCTS: Gallbladder is unremarkable. No biliary ductal dilatation. SPLEEN: No acute abnormality. PANCREAS: No acute abnormality. ADRENAL GLANDS: Bilateral adrenal nodules are unchanged and were previously characterized as adenomas. KIDNEYS, URETERS AND BLADDER: Bilateral renal cysts and complex cysts appear unchanged from the prior study. There is a punctate nonobstructing left renal calculus, also unchanged. The bladder is markedly distended. No hydronephrosis. No perinephric or periureteral stranding. GI AND BOWEL: Stomach demonstrates no acute abnormality. There is a large amount of stool throughout the entire colon. There is diffuse colonic diverticulosis. The appendix appears within normal limits. The rectum is dilated and stool filled measuring up to 9 cm with presacral edema. There is no bowel obstruction. PERITONEUM AND RETROPERITONEUM: No ascites. No free air. VASCULATURE: Aorta is normal in caliber. There are atherosclerotic calcifications of the aorta and iliac arteries. LYMPH NODES: No lymphadenopathy. REPRODUCTIVE ORGANS: Prostate gland is enlarged with calcifications. BONES AND SOFT TISSUES: Left sided hip screws are present. There is an intramuscular fluid collection lateral to the proximal femoral diaphysis measuring 6.6 x 3.0 x 3.1 cm. No acute osseous abnormality. No focal soft tissue abnormality. IMPRESSION: 1. Markedly distended bladder, suspicious for urinary retention. 2. Fecal impaction with stercoral colitis. 3. Diffuse colonic diverticulosis without evidence of diverticulitis. 4. Moderate sized inguinal hernias, with the right inguinal hernia containing nondilated bowel. 5. Intramuscular fluid collection lateral to the proximal femoral diaphysis measuring 6.6 x 3.0 x 3.1 cm. Correlate clinically for infection. Electronically signed by: Greig Pique MD MD  06/18/2024 05:30 PM EST RP Workstation: [id number]   XR HIP UNILAT W OR W/O PELVIS 2-3 VIEWS LEFT Result Date: 06/10/2024 XRs of the left hip from 06/10/2024 were independently reviewed and interpreted, showing a valgus impacted femoral neck fracture.  Fracture alignment appears similar to intraoperative films from 05/23/2024.  There are 3 partially-threaded screws spanning the fracture site.  No lucency seen around the screws.  None of the screws have backed out.  No new fracture seen.  No dislocation seen.  XR Shoulder Left Result Date: 06/10/2024 XR of the left shoulder from 06/10/2024 was independently reviewed and interpreted, showing a significantly displaced proximal humerus fracture with medial translation and shortening through the fracture site. No new fracture seen. No dislocation seen.      Discharge Exam: Vitals:   07/02/24 2050 07/03/24 0540  BP: (!) 137/59 (!) 157/60  Pulse: 77 70  Resp: 16 17  Temp: 97.8 F (36.6 C) 97.8 F (36.6 C)  SpO2: 94% 98%    General: Pt is alert, awake, not in acute distress Cardiovascular: RRR, S1/S2 +, no edema Respiratory: CTA bilaterally, no wheezing, no rhonchi, no respiratory distress, no conversational dyspnea  Abdominal: Soft, NT, ND, bowel sounds + Extremities: no edema, no cyanosis, left UE in sling  GU: foley catheter bag draining yellow urine  Psych: Normal mood and affect, stable judgement and insight     The results of significant diagnostics from this hospitalization (including imaging, microbiology, ancillary and laboratory) are listed below for reference.     Microbiology: Recent Results (from the past 240 hours)  Blood culture (routine x 2)     Status: None   Collection Time: 06/27/24  4:00 PM   Specimen: BLOOD  Result Value Ref Range Status   Specimen Description   Final    BLOOD LEFT ANTECUBITAL Performed at [facility], [address] [place], [place]  [address]    Special Requests    Final    BOTTLES DRAWN AEROBIC AND ANAEROBIC Blood Culture adequate volume Performed at [facility], [address], [place], [place] [address]    Culture   Final    NO GROWTH 5 DAYS Performed at [facility], [address], [place], [place] [address]    Report Status 07/02/2024 FINAL  Final  MRSA Next Gen by PCR, Nasal     Status: Abnormal   Collection Time: 06/27/24  9:56 PM   Specimen: Nasal Mucosa; Nasal Swab  Result Value Ref Range Status   MRSA by PCR Next Gen DETECTED (A) NOT DETECTED Final    Comment: CRITICAL RESULT CALLED TO, READ BACK BY AND VERIFIED WITH: ARLOA BEAL RN AT [phone] 06/28/2024 BY JEREMY C (NOTE) The GeneXpert MRSA Assay (FDA approved for NASAL specimens only), is one component of a comprehensive MRSA colonization surveillance program. It is not intended to diagnose MRSA infection nor to guide or monitor treatment for MRSA infections. Test performance is not FDA approved in patients less than 5 years old. Performed at [facility], [address] [place], [place] [address]   Blood culture (routine x 2)     Status: None   Collection Time: 06/27/24 10:17 PM   Specimen: BLOOD RIGHT HAND  Result Value Ref Range Status   Specimen Description   Final    BLOOD RIGHT HAND Performed at [facility], [address], [place], [place] [address]    Special Requests   Final    BOTTLES DRAWN AEROBIC ONLY Blood Culture adequate volume Performed at [facility], [address] [place], [place] [address]    Culture   Final    NO GROWTH 5 DAYS Performed at [facility], [address] [place], [place] [address]    Report Status 07/03/2024 FINAL  Final     Labs: BNP (last 3 results) No results for input(s): BNP in the last 8760 hours. Basic Metabolic Panel: Recent Labs  Lab 06/28/24 0307 06/29/24 0249 06/30/24 0401 07/01/24 0412 07/02/24 0418  NA 138 140 141 141 138  K 4.3 3.9  3.4* 3.4* 4.3  CL 106 107 105 106 103  CO2 24 24 27 27 29   GLUCOSE 197* 141* 109* 140* 132*  BUN 16 12 9 11 16   CREATININE 0.82 0.84 0.77 0.81 0.78  CALCIUM  8.4* 8.8* 9.2 8.8* 9.0  MG  --   --   --   --  2.2   Liver Function Tests: Recent Labs  Lab 06/27/24 1600 06/28/24 0307  AST 19 15  ALT <5 <5  ALKPHOS 154* 126  BILITOT 0.5 0.2  PROT 6.3* 5.4*  ALBUMIN  3.6 3.1*   No results for input(s): LIPASE, AMYLASE in the last 168 hours. No results for input(s): AMMONIA in the last 168 hours. CBC: Recent Labs  Lab 06/27/24 1600 06/27/24 1616 06/27/24 2217 06/28/24 0307 06/29/24 0249 06/30/24 0401 07/01/24 0412 07/02/24 0418 07/03/24 0415  WBC 12.4*  --  17.8*   < > 12.1* 10.4 10.3 13.5* 12.1*  NEUTROABS 9.8*  --  16.2*  --  10.0* 7.4 7.3  --   --   HGB 9.2*   < > 7.8*   < > 7.0* 9.2* 8.7* 8.8* 9.0*  HCT 29.7*   < > 24.9*   < > 22.1* 27.5* 26.9* 28.2* 28.1*  MCV 94.6  --  95.4   < >  93.6 90.2 93.1 94.6 94.6  PLT 357  --  275   < > 295 351 338 358 349   < > = values in this interval not displayed.   Cardiac Enzymes: No results for input(s): CKTOTAL, CKMB, CKMBINDEX, TROPONINI in the last 168 hours. BNP: Invalid input(s): POCBNP CBG: No results for input(s): GLUCAP in the last 168 hours. D-Dimer No results for input(s): DDIMER in the last 72 hours. Hgb A1c No results for input(s): HGBA1C in the last 72 hours. Lipid Profile No results for input(s): CHOL, HDL, LDLCALC, TRIG, CHOLHDL, LDLDIRECT in the last 72 hours. Thyroid  function studies No results for input(s): TSH, T4TOTAL, T3FREE, THYROIDAB in the last 72 hours.  Invalid input(s): FREET3 Anemia work up No results for input(s): VITAMINB12, FOLATE, FERRITIN, TIBC, IRON , RETICCTPCT in the last 72 hours. Urinalysis    Component Value Date/Time   COLORURINE STRAW (A) 06/28/2024 0207   APPEARANCEUR CLEAR 06/28/2024 0207   LABSPEC 1.006 06/28/2024 0207    PHURINE 6.0 06/28/2024 0207   GLUCOSEU NEGATIVE 06/28/2024 0207   HGBUR LARGE (A) 06/28/2024 0207   BILIRUBINUR NEGATIVE 06/28/2024 0207   KETONESUR NEGATIVE 06/28/2024 0207   PROTEINUR NEGATIVE 06/28/2024 0207   NITRITE NEGATIVE 06/28/2024 0207   LEUKOCYTESUR MODERATE (A) 06/28/2024 0207   Sepsis Labs Recent Labs  Lab 06/30/24 0401 07/01/24 0412 07/02/24 0418 07/03/24 0415  WBC 10.4 10.3 13.5* 12.1*   Microbiology Recent Results (from the past 240 hours)  Blood culture (routine x 2)     Status: None   Collection Time: 06/27/24  4:00 PM   Specimen: BLOOD  Result Value Ref Range Status   Specimen Description   Final    BLOOD LEFT ANTECUBITAL Performed at [facility], [address] [place], [place] [address]    Special Requests   Final    BOTTLES DRAWN AEROBIC AND ANAEROBIC Blood Culture adequate volume Performed at [facility], [address] [place], [place] [address]    Culture   Final    NO GROWTH 5 DAYS Performed at [facility], [address], [place], [place] [address]    Report Status 07/02/2024 FINAL  Final  MRSA Next Gen by PCR, Nasal     Status: Abnormal   Collection Time: 06/27/24  9:56 PM   Specimen: Nasal Mucosa; Nasal Swab  Result Value Ref Range Status   MRSA by PCR Next Gen DETECTED (A) NOT DETECTED Final    Comment: CRITICAL RESULT CALLED TO, READ BACK BY AND VERIFIED WITH: ARLOA BEAL RN AT [phone] 06/28/2024 BY JEREMY C (NOTE) The GeneXpert MRSA Assay (FDA approved for NASAL specimens only), is one component of a comprehensive MRSA colonization surveillance program. It is not intended to diagnose MRSA infection nor to guide or monitor treatment for MRSA infections. Test performance is not FDA approved in patients less than 51 years old. Performed at [facility], [address] [place], [place] [address]   Blood culture (routine x 2)     Status: None   Collection Time: 06/27/24  10:17 PM   Specimen: BLOOD RIGHT HAND  Result Value Ref Range Status   Specimen Description   Final    BLOOD RIGHT HAND Performed at [facility], [address] [place], [place] [address]    Special Requests   Final    BOTTLES DRAWN AEROBIC ONLY Blood Culture adequate volume Performed at [facility], [address] [place], [place] [address]  Culture   Final    NO GROWTH 5 DAYS Performed at [facility], [address], [place], [place] [address]    Report Status 07/03/2024 FINAL  Final     Patient was seen and examined on the day of discharge and was found to be in stable condition. Time coordinating discharge: 35 minutes including assessment and coordination of care, as well as examination of the patient.   SIGNED:  Delon Hoe, DO [facility] 07/03/2024, 10:45 AM       [1]  Allergies Allergen Reactions   Hydrochlorothiazide Nausea Only and Palpitations   Codeine Nausea And Vomiting   Doxycycline Nausea Only   Metronidazole  Nausea Only   Mysoline [Primidone] Other (See Comments)    Change in balance

## 2024-07-03 NOTE — TOC Transition Note (Signed)
 Transition of Care [facility]) - Discharge Note   Patient Details  Name: Michael Odonnell MRN: [id number] Date of Birth: 17-Apr-1941  [facility] CM/SW Contact:  Tawni CHRISTELLA Eva, LCSW Phone Number: 07/03/2024, 11:36 AM   Clinical Narrative:    Pt to d/c to [facility]. Pt's room 103B, RN to call report to [phone]. CSW spoke with pt's spouse who agrees with plan. PTAR called ICM sign off.    Final next level of care: Skilled Nursing Facility Barriers to Discharge: Barriers Resolved   Patient Goals and CMS Choice Patient states their goals for this hospitalization and ongoing recovery are:: SNf to get stonger          Discharge Placement              Patient chooses bed at: [facility] Patient to be transferred to facility by: EMS Name of family member notified: edelson elijah Johann, Emergency Contact  [phone] (Mobile) Patient and family notified of of transfer: 07/03/24  Discharge Plan and Services Additional resources added to the After Visit Summary for                                       Social Drivers of Health (SDOH) Interventions SDOH Screenings   Food Insecurity: No Food Insecurity (06/27/2024)  Housing: Low Risk (06/27/2024)  Transportation Needs: No Transportation Needs (06/27/2024)  Utilities: Not At Risk (06/27/2024)  Social Connections: Patient Unable To Answer (06/27/2024)  Recent Concern: Social Connections - Moderately Isolated (05/23/2024)  Tobacco Use: Medium Risk (06/27/2024)     Readmission Risk Interventions     No data to display

## 2024-07-13 ENCOUNTER — Emergency Department (HOSPITAL_COMMUNITY)
Admission: EM | Admit: 2024-07-13 | Discharge: 2024-07-13 | Disposition: A | Discharge status: Home / Self Care | Source: Skilled Nursing Facility | Attending: Emergency Medicine | Admitting: Emergency Medicine

## 2024-07-13 DIAGNOSIS — Z79899 Other long term (current) drug therapy: Secondary | ICD-10-CM | POA: Insufficient documentation

## 2024-07-13 DIAGNOSIS — Z85828 Personal history of other malignant neoplasm of skin: Secondary | ICD-10-CM | POA: Insufficient documentation

## 2024-07-13 DIAGNOSIS — I10 Essential (primary) hypertension: Secondary | ICD-10-CM | POA: Insufficient documentation

## 2024-07-13 DIAGNOSIS — I4891 Unspecified atrial fibrillation: Secondary | ICD-10-CM | POA: Insufficient documentation

## 2024-07-13 DIAGNOSIS — R310 Gross hematuria: Secondary | ICD-10-CM | POA: Insufficient documentation

## 2024-07-13 DIAGNOSIS — G20C Parkinsonism, unspecified: Secondary | ICD-10-CM | POA: Insufficient documentation

## 2024-07-13 DIAGNOSIS — Z7901 Long term (current) use of anticoagulants: Secondary | ICD-10-CM | POA: Insufficient documentation

## 2024-07-13 LAB — CBC
HCT: 33.9 % — ABNORMAL LOW (ref 39.0–52.0)
Hemoglobin: 11 g/dL — ABNORMAL LOW (ref 13.0–17.0)
MCH: 30.1 pg (ref 26.0–34.0)
MCHC: 32.4 g/dL (ref 30.0–36.0)
MCV: 92.6 fL (ref 80.0–100.0)
Platelets: 318 10*3/uL (ref 150–400)
RBC: 3.66 MIL/uL — ABNORMAL LOW (ref 4.22–5.81)
RDW: 14.4 % (ref 11.5–15.5)
WBC: 12.1 10*3/uL — ABNORMAL HIGH (ref 4.0–10.5)
nRBC: 0 % (ref 0.0–0.2)

## 2024-07-13 LAB — BASIC METABOLIC PANEL WITH GFR
Anion gap: 11 (ref 5–15)
BUN: 10 mg/dL (ref 8–23)
CO2: 24 mmol/L (ref 22–32)
Calcium: 9.3 mg/dL (ref 8.9–10.3)
Chloride: 105 mmol/L (ref 98–111)
Creatinine, Ser: 0.86 mg/dL (ref 0.61–1.24)
GFR, Estimated: >60 mL/min
Glucose, Bld: 135 mg/dL — ABNORMAL HIGH (ref 70–99)
Potassium: 3.3 mmol/L — ABNORMAL LOW (ref 3.5–5.1)
Sodium: 139 mmol/L (ref 135–145)

## 2024-07-13 MED ORDER — SODIUM CHLORIDE 0.9 % IR SOLN: 3000.0000 mL | Status: DC

## 2024-07-13 NOTE — ED Triage Notes (Signed)
 BIB EMS from [facility]. Hematuria starting this morning. Frequent reoccurring issue. Seen multiple times for same. No complaints from patient. Is taking eliquis .  Hx of suprapubic cath.

## 2024-07-13 NOTE — Discharge Instructions (Addendum)
 Pause the Eliquis  until he is 48 hours without hematuria.  Follow-up with urology as needed.

## 2024-07-20 ENCOUNTER — Ambulatory Visit: Admitting: Orthopedic Surgery

## 2024-07-22 ENCOUNTER — Ambulatory Visit: Admitting: Neurology

## 2024-07-27 ENCOUNTER — Ambulatory Visit: Admitting: Cardiology

## 2024-08-04 ENCOUNTER — Telehealth: Admitting: Neurology

## 2024-11-03 ENCOUNTER — Ambulatory Visit: Admitting: Neurology
